# Patient Record
Sex: Female | Born: 1945 | Race: White | Hispanic: No | Smoking: Former smoker
Health system: Southern US, Community
[De-identification: ages and names within clinical notes are randomized; demographics above are authoritative.]

## PROBLEM LIST (undated history)

## (undated) DIAGNOSIS — M1990 Unspecified osteoarthritis, unspecified site: Secondary | ICD-10-CM

## (undated) DIAGNOSIS — Z9981 Dependence on supplemental oxygen: Secondary | ICD-10-CM

## (undated) DIAGNOSIS — F410 Panic disorder [episodic paroxysmal anxiety] without agoraphobia: Secondary | ICD-10-CM

## (undated) DIAGNOSIS — S0300XA Dislocation of jaw, unspecified side, initial encounter: Secondary | ICD-10-CM

## (undated) DIAGNOSIS — K219 Gastro-esophageal reflux disease without esophagitis: Secondary | ICD-10-CM

## (undated) DIAGNOSIS — C50912 Malignant neoplasm of unspecified site of left female breast: Secondary | ICD-10-CM

## (undated) DIAGNOSIS — I639 Cerebral infarction, unspecified: Secondary | ICD-10-CM

## (undated) DIAGNOSIS — Z9289 Personal history of other medical treatment: Secondary | ICD-10-CM

## (undated) DIAGNOSIS — Z973 Presence of spectacles and contact lenses: Secondary | ICD-10-CM

## (undated) DIAGNOSIS — J189 Pneumonia, unspecified organism: Secondary | ICD-10-CM

## (undated) DIAGNOSIS — IMO0002 Reserved for concepts with insufficient information to code with codable children: Secondary | ICD-10-CM

## (undated) DIAGNOSIS — Z8719 Personal history of other diseases of the digestive system: Secondary | ICD-10-CM

## (undated) DIAGNOSIS — G459 Transient cerebral ischemic attack, unspecified: Secondary | ICD-10-CM

## (undated) DIAGNOSIS — I10 Essential (primary) hypertension: Secondary | ICD-10-CM

## (undated) HISTORY — DX: Gastro-esophageal reflux disease without esophagitis: K21.9

## (undated) HISTORY — PX: JOINT REPLACEMENT: SHX530

## (undated) HISTORY — DX: Unspecified osteoarthritis, unspecified site: M19.90

## (undated) HISTORY — PX: COSMETIC SURGERY: SHX468

## (undated) HISTORY — DX: Essential (primary) hypertension: I10

## (undated) HISTORY — PX: VAGINAL HYSTERECTOMY: SUR661

## (undated) HISTORY — DX: Dislocation of jaw, unspecified side, initial encounter: S03.00XA

## (undated) HISTORY — PX: CARPAL TUNNEL RELEASE: SHX101

## (undated) HISTORY — PX: BACK SURGERY: SHX140

## (undated) HISTORY — DX: Reserved for concepts with insufficient information to code with codable children: IMO0002

## (undated) HISTORY — PX: TONSILLECTOMY: SUR1361

---
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
## 2011-09-04 ENCOUNTER — Telehealth: Payer: Self-pay

## 2011-09-04 DIAGNOSIS — R0600 Dyspnea, unspecified: Secondary | ICD-10-CM

## 2011-09-04 NOTE — Telephone Encounter (Signed)
There is no info in epic on her. Who is her PCP ? Why did she choose me ? Is a friend/relative/family a patient of mine ? If so, whom ?  Thanks  MR

## 2011-09-04 NOTE — Telephone Encounter (Signed)
Spoke with [name]. She states that Dr. Corliss Skains usually refers all of her pt's here to [facility]. She states that the pt is seen there for multiple joint pain and has only been seen there twice. She states that she is needing to be seen here for SOB p mold exposure. Her PCP is Dr. Rosalin Hawking. Please advise thanks

## 2011-09-04 NOTE — Telephone Encounter (Signed)
MR, since you are first choice do you want to [facility] your schedule to see the pt? Please advise thanks!

## 2011-09-08 NOTE — Telephone Encounter (Signed)
Called for Breanna [name], was placed on hold x 5 min. WCB later.

## 2011-09-08 NOTE — Telephone Encounter (Signed)
I have only 9 patients on 09/11/11. Please place her in for consult on 7/.5/13 Friday as add on in the middle or towards end part of the clinic. I would prefer she has PFTs done before she sseems. Indication dyspnea and PFTs can be done at [facility] [place] or [facility] ([facility])

## 2011-09-08 NOTE — Telephone Encounter (Signed)
Dr. Fatima Sanger office is calling back for the status of this appointment.  Advised [facility] would be in this afternoon.  [name] w/ Dr. Fatima Sanger office can be reached at [phone].  Breanna [name]

## 2011-09-09 ENCOUNTER — Telehealth: Payer: Self-pay | Admitting: Internal Medicine

## 2011-09-09 ENCOUNTER — Encounter: Payer: Self-pay | Admitting: Internal Medicine

## 2011-09-09 NOTE — Telephone Encounter (Signed)
I spoke with [name] and pt is scheduled to come in  09/11/11 at 3:00. [name] aware to have pt arrive 15 minutes early to fill out paperwork. She is going to fax notes over. I have also added a home phone number in chart for pt.

## 2011-09-09 NOTE — Telephone Encounter (Signed)
Ok thanks 

## 2011-09-09 NOTE — Telephone Encounter (Signed)
Dr Drusilla Kanner texted me this morning about status of appt. I will let her know their office placed you on hold x 5 min yesterday. I suggest you directly  call patient and give appt direct and get the referral paper later

## 2011-09-09 NOTE — Telephone Encounter (Signed)
Pt has no number listed in chart so I will call Dr. Percell Locus office back. Will sign off message since their is already one open on pt.

## 2011-09-11 ENCOUNTER — Institutional Professional Consult (permissible substitution): Payer: Self-pay | Admitting: Internal Medicine

## 2011-09-16 ENCOUNTER — Telehealth: Payer: Self-pay | Admitting: Internal Medicine

## 2011-09-16 NOTE — Telephone Encounter (Signed)
The pt was originally scheduled to see [facility] on Friday 09-11-11, but had to be r/s. She r/s to 10-07-11 with [facility]. MR wanted me to call and offer pt an appt on Friday 09-18-11 because he opened office that morning. I [id number]. Carron Curie, CMA

## 2011-09-17 NOTE — Telephone Encounter (Signed)
Pt scheduled to see [facility] on 09-18-11 at 10am.Breanna [name], CMA

## 2011-09-17 NOTE — Telephone Encounter (Signed)
Pt returned [name]'s call.  Pt can be reached at [phone],  her cell [phone] or her husband's cell [phone].  I did not see any available slots w/ [facility] on Friday, 09/18/11.  Breanna [name]

## 2011-09-18 ENCOUNTER — Ambulatory Visit (INDEPENDENT_AMBULATORY_CARE_PROVIDER_SITE_OTHER): Payer: Medicare Other | Admitting: Internal Medicine

## 2011-09-18 ENCOUNTER — Other Ambulatory Visit: Payer: Medicare Other

## 2011-09-18 ENCOUNTER — Encounter: Payer: Self-pay | Admitting: Internal Medicine

## 2011-09-18 ENCOUNTER — Ambulatory Visit (INDEPENDENT_AMBULATORY_CARE_PROVIDER_SITE_OTHER)
Admission: RE | Admit: 2011-09-18 | Discharge: 2011-09-18 | Disposition: A | Discharge status: Home / Self Care | Payer: Medicare Other | Source: Ambulatory Visit | Attending: Internal Medicine | Admitting: Internal Medicine

## 2011-09-18 VITALS — BP 140/82 | HR 69 | Temp 98.0°F | Ht 64.5 in | Wt 210.6 lb

## 2011-09-18 DIAGNOSIS — Z77120 Contact with and (suspected) exposure to mold (toxic): Secondary | ICD-10-CM

## 2011-09-18 DIAGNOSIS — R0600 Dyspnea, unspecified: Secondary | ICD-10-CM

## 2011-09-18 DIAGNOSIS — R0609 Other forms of dyspnea: Secondary | ICD-10-CM

## 2011-09-18 DIAGNOSIS — R0902 Hypoxemia: Secondary | ICD-10-CM

## 2011-09-18 DIAGNOSIS — R0989 Other specified symptoms and signs involving the circulatory and respiratory systems: Secondary | ICD-10-CM

## 2011-09-18 NOTE — Progress Notes (Signed)
Subjective:    Patient ID: Breanna [name], female    DOB: Mar 09, 1946, 66 y.o.   MRN: [id number]  HPI PCP is Provider Not In System . Body mass index is 35.59 kg/(m^2).  reports that she quit smoking about 23 years ago. Her smoking use included Cigarettes. She has a 15 pack-year smoking history. She has never used smokeless tobacco.   IOV 09/18/2011 Referred by Dr Pollyann Savoy. SHe is a 66 year old female.   At baseline does not have headache or migraine or cough, dyspnea, or chest tighness, wheezing. Approx 6 weeks ago, cleaned out a basement for 2 days that had lot of mold on it. NExt day dyspneic and went to PMD (cxr reportedly normal) who started patient on antibiotic doxy, prednisone x 13 days, symbicort, and veramyst. This was 3 weeks ago. She finished antibiotic and pred burst in 10 and 14 days respectively but around this time started getting "migraines" (headache noted as sinus pressure, back of neck ache, sides of headache, nauseated, relieved by ice and being in a dark room) so she stopped symbicort and veramyst 5 days ago but stil with migraines.  Currently still with respiratory complaints - with dyspnea being the chief issue amongst respiratory complaints. Says driving up to office, got dyspneic abruptly. Feels like a panic attack though does not have a sense of doom and she is going to die. There is associated cough and wheeze but is mild and significantly improved. Dyspnea made worse by humidity. Walking does not make her dyspneic but going up steps makes her dyspneic. Severity is moderate-severe. No associaed chest pain, orthopnea, paroxysmal nocturnal dyspnea or edema.   Note; She got emotional talking about her dyspnea. Said she was under lot of stress due to her health but would not elaborate  Walk test in office   -185 feet x  3 laps: pulse ox 93% at rest on arrival, 86% after spirometry at rest, then with walking fluctuated between 86% and 93%  Spirometry today in office  -  fev1 1.9L/815, FVC 2.$L/78%, ratio 81, - mild restriction could be c/w obesity   Outside labs per DR Corliss Skains note  - ESR 4  RF - neg  - ANA +ve1:40 CCP - negative  CMP - normal Vit D 76 Hep panel neg  ACE negative    Past Medical History  Diagnosis Date  . Osteoarthritis   . Psoriasis   . DDD (degenerative disc disease)   . TMJ (dislocation of temporomandibular joint)   . GERD (gastroesophageal reflux disease)   . Hypertension      Family History  Problem Relation Age of Onset  . Rheum arthritis    . Cancer    . Osteoarthritis    . Heart disease Father   . Kidney disease    . Alcohol abuse    . Hypertension    . Goiter    . Emphysema Mother   . Allergies Sister   . Asthma Sister   . Hodgkin's lymphoma Brother   . Lymphoma Mother      History   Social History  . Marital Status: Married    Spouse Name: N/A    Number of Children: 3  . Years of Education: N/A   Occupational History  . Retired     Engineer, civil (consulting)   Social History Main Topics  . Smoking status: Former Smoker -- 1.0 packs/day for 15 years    Types: Cigarettes    Quit date: 03/09/1988  . Smokeless tobacco:  Never Used  . Alcohol Use: No  . Drug Use: No  . Sexually Active: Not on file   Other Topics Concern  . Not on file   Social History Narrative  . No narrative on file     Allergies  Allergen Reactions  . Nasonex (Mometasone Furoate)     headache  . Neurontin (Gabapentin)     depression     No outpatient prescriptions prior to visit.         Review of Systems  Constitutional: Negative for fever and unexpected weight change.  HENT: Positive for congestion, sore throat, rhinorrhea, sneezing, postnasal drip and sinus pressure. Negative for ear pain, nosebleeds, trouble swallowing and dental problem.   Eyes: Positive for redness and itching.  Respiratory: Positive for cough, chest tightness, shortness of breath and wheezing.   Cardiovascular: Positive for leg swelling. Negative  for palpitations.  Gastrointestinal: Positive for nausea. Negative for vomiting.  Genitourinary: Negative for dysuria.  Musculoskeletal: Positive for joint swelling.  Skin: Negative for rash.  Neurological: Positive for headaches.  Hematological: Does not bruise/bleed easily.  Psychiatric/Behavioral: Negative for dysphoric mood. The patient is nervous/anxious.        Objective:   Physical Exam  Vitals reviewed. Constitutional: She is oriented to person, place, and time. She appears well-developed and well-nourished. No distress.       Obese Body mass index is 35.59 kg/(m^2).   HENT:  Head: Normocephalic and atraumatic.  Right Ear: External ear normal.  Left Ear: External ear normal.  Mouth/Throat: Oropharynx is clear and moist. No oropharyngeal exudate.       mallampatti class 2-3  Eyes: Conjunctivae and EOM are normal. Pupils are equal, round, and reactive to light. Right eye exhibits no discharge. Left eye exhibits no discharge. No scleral icterus.  Neck: Normal range of motion. Neck supple. No JVD present. No tracheal deviation present. No thyromegaly present.  Cardiovascular: Normal rate, regular rhythm, normal heart sounds and intact distal pulses.  Exam reveals no gallop and no friction rub.   No murmur heard. Pulmonary/Chest: Effort normal and breath sounds normal. No respiratory distress. She has no wheezes. She has no rales. She exhibits no tenderness.  Abdominal: Soft. Bowel sounds are normal. She exhibits no distension and no mass. There is no tenderness. There is no rebound and no guarding.  Musculoskeletal: Normal range of motion. She exhibits no edema and no tenderness.  Lymphadenopathy:    She has no cervical adenopathy.  Neurological: She is alert and oriented to person, place, and time. She has normal reflexes. No cranial nerve deficit. She exhibits normal muscle tone. Coordination normal.  Skin: Skin is warm and dry. No rash noted. She is not diaphoretic. No  erythema. No pallor.  Psychiatric: Judgment and thought content normal.       Anxious One crying spell          Assessment & Plan:

## 2011-09-18 NOTE — Patient Instructions (Addendum)
Pleaes have CT chest Please have blood work towards mold exposure Pleaes have overnight oxygen study Please have full PFT breathing test I will inform you of results over next several days to weeks and instruct on followup based on that

## 2011-09-18 NOTE — Assessment & Plan Note (Signed)
She has dyspnea following mold exposure. This is new since mold exposure. There might be asspocitaed hypoxemia. I am concerned she might have hypersensitivity pneumonitis  Plan Pleaes have CT chest Please have blood work towards mold exposure Pleaes have overnight oxygen study Please have full PFT breathing test I will inform you of results over next several days to weeks and instruct on followup based on that (note, I will be on vacation adn will be abel to review blood work and ct chest only during this time)

## 2011-09-19 ENCOUNTER — Telehealth: Payer: Self-pay | Admitting: Internal Medicine

## 2011-09-19 NOTE — Telephone Encounter (Signed)
Ct chest 7.12/13   - clear no evidence of mold affecting lung tissue; this means shortness of breath unexplained. Once she has her PFTs see if you can scan it in and let me know. I can look remote and get back. Otherwise, wait till 10/12/11   - small < 4mm lung nodules - please order CT chest wo contrast repeat in 1 year  Thank MR

## 2011-09-24 NOTE — Telephone Encounter (Signed)
LMTCBx1.[name] Castillo, CMA  

## 2011-09-28 LAB — HYPERSENSITIVITY PNUEMONITIS PROFILE

## 2011-09-28 NOTE — Telephone Encounter (Signed)
Pt aware and PFt set for 09-30-11 at 1pm.[name] McCall, CMA

## 2011-09-30 ENCOUNTER — Ambulatory Visit (INDEPENDENT_AMBULATORY_CARE_PROVIDER_SITE_OTHER): Payer: Medicare Other | Admitting: Internal Medicine

## 2011-09-30 DIAGNOSIS — R0989 Other specified symptoms and signs involving the circulatory and respiratory systems: Secondary | ICD-10-CM

## 2011-09-30 DIAGNOSIS — R0600 Dyspnea, unspecified: Secondary | ICD-10-CM

## 2011-09-30 LAB — PULMONARY FUNCTION TEST

## 2011-09-30 NOTE — Progress Notes (Signed)
PFT done today. 

## 2011-10-07 ENCOUNTER — Institutional Professional Consult (permissible substitution): Payer: Self-pay | Admitting: Internal Medicine

## 2011-10-13 ENCOUNTER — Telehealth: Payer: Self-pay | Admitting: Internal Medicine

## 2011-10-13 NOTE — Telephone Encounter (Signed)
ONO 09/29/11 - desaturatin at nuight + (34 min , 8.5% of total sleep < 88% but no pulse ox < 80%). Please start her on 1L oxygen. PFt normal   Please have her evaluated for possible sleep apnea and then regroup to see me. See any of the sleep docs in our office  For my review below  PFT - 09/30/11 - normal except isolated low dlcio  HP panel - mold antigent positive but ct shows no evidence of ILD

## 2011-10-15 ENCOUNTER — Encounter: Payer: Self-pay | Admitting: Internal Medicine

## 2011-10-15 NOTE — Telephone Encounter (Signed)
LMTCBx1.Rufus Cypert, CMA  

## 2011-10-16 NOTE — Telephone Encounter (Signed)
Pt returned call. [phone]. Breanna [name]

## 2011-10-16 NOTE — Telephone Encounter (Signed)
LMTCBx2. Elane Peabody, CMA  

## 2011-10-16 NOTE — Telephone Encounter (Signed)
Patient returning call.

## 2011-10-16 NOTE — Telephone Encounter (Signed)
I spoke with the pt and advised of results. The pt states she is going out of town in 2 days for 1 month and does not want to worry about oxygen on the trip. She states she will call when she returns and will schedule appt with sleep doctor and then at that time we can order oxygen. I have placed a reminder to send order for oxygen 1 liter qhs in one month. Carron Curie, CMA

## 2011-11-26 ENCOUNTER — Other Ambulatory Visit: Payer: Self-pay | Admitting: Internal Medicine

## 2011-11-26 DIAGNOSIS — R0600 Dyspnea, unspecified: Secondary | ICD-10-CM

## 2011-11-27 ENCOUNTER — Telehealth: Payer: Self-pay | Admitting: Internal Medicine

## 2011-11-27 NOTE — Telephone Encounter (Signed)
Spoke with patient- states she doesn't feel she needs the Oxygen therefore she refused the order from [facility]; will forward to MR for any additional recs/advice.

## 2011-11-27 NOTE — Telephone Encounter (Signed)
Not ok but I respect her decision. Will d.w her at followup. Cancel o2 set up

## 2011-11-27 NOTE — Telephone Encounter (Signed)
I spoke with Dawn at [facility] and have cancelled the order for QHS O2; also I left a message at patients home number that we have DC'd the order and MR will discuss in detail with her at next OV.

## 2013-03-09 DIAGNOSIS — G459 Transient cerebral ischemic attack, unspecified: Secondary | ICD-10-CM

## 2013-03-09 HISTORY — DX: Transient cerebral ischemic attack, unspecified: G45.9

## 2014-02-22 DIAGNOSIS — M1711 Unilateral primary osteoarthritis, right knee: Secondary | ICD-10-CM | POA: Insufficient documentation

## 2014-03-09 HISTORY — PX: KNEE ARTHROSCOPY: SHX127

## 2014-07-20 ENCOUNTER — Other Ambulatory Visit: Payer: Self-pay | Admitting: Orthopaedic Surgery

## 2014-08-02 ENCOUNTER — Encounter (HOSPITAL_COMMUNITY)
Admission: RE | Admit: 2014-08-02 | Discharge: 2014-08-02 | Disposition: A | Discharge status: Home / Self Care | Payer: Medicare Other | Source: Ambulatory Visit | Attending: Orthopaedic Surgery | Admitting: Orthopaedic Surgery

## 2014-08-02 ENCOUNTER — Encounter (HOSPITAL_COMMUNITY): Payer: Self-pay

## 2014-08-02 HISTORY — DX: Cerebral infarction, unspecified: I63.9

## 2014-08-02 HISTORY — DX: Panic disorder (episodic paroxysmal anxiety): F41.0

## 2014-08-02 LAB — CBC WITH DIFFERENTIAL/PLATELET
Basophils Absolute: 0 10*3/uL (ref 0.0–0.1)
Basophils Relative: 1 % (ref 0–1)
Eosinophils Absolute: 0.1 10*3/uL (ref 0.0–0.7)
Eosinophils Relative: 2 % (ref 0–5)
HCT: 42.9 % (ref 36.0–46.0)
HEMOGLOBIN: 14.5 g/dL (ref 12.0–15.0)
Lymphocytes Relative: 38 % (ref 12–46)
Lymphs Abs: 2.5 10*3/uL (ref 0.7–4.0)
MCH: 31.5 pg (ref 26.0–34.0)
MCHC: 33.8 g/dL (ref 30.0–36.0)
MCV: 93.3 fL (ref 78.0–100.0)
MONO ABS: 0.6 10*3/uL (ref 0.1–1.0)
Monocytes Relative: 9 % (ref 3–12)
NEUTROS ABS: 3.5 10*3/uL (ref 1.7–7.7)
Neutrophils Relative %: 52 % (ref 43–77)
Platelets: 238 10*3/uL (ref 150–400)
RBC: 4.6 MIL/uL (ref 3.87–5.11)
RDW: 12.7 % (ref 11.5–15.5)
WBC: 6.7 10*3/uL (ref 4.0–10.5)

## 2014-08-02 LAB — BASIC METABOLIC PANEL
Anion gap: 10 (ref 5–15)
BUN: 15 mg/dL (ref 6–20)
CHLORIDE: 103 mmol/L (ref 101–111)
CO2: 28 mmol/L (ref 22–32)
Calcium: 9.4 mg/dL (ref 8.9–10.3)
Creatinine, Ser: 0.62 mg/dL (ref 0.44–1.00)
GFR calc non Af Amer: >60 mL/min (ref >60)
GLUCOSE: 91 mg/dL (ref 65–99)
Potassium: 3.7 mmol/L (ref 3.5–5.1)
SODIUM: 141 mmol/L (ref 135–145)

## 2014-08-02 LAB — PROTIME-INR
INR: 1.04 (ref 0.00–1.49)
Prothrombin Time: 13.8 seconds (ref 11.6–15.2)

## 2014-08-02 LAB — URINALYSIS, ROUTINE W REFLEX MICROSCOPIC
Bilirubin Urine: NEGATIVE
Glucose, UA: NEGATIVE mg/dL
Hgb urine dipstick: NEGATIVE
Ketones, ur: NEGATIVE mg/dL
Leukocytes, UA: NEGATIVE
NITRITE: NEGATIVE
PH: 6.5 (ref 5.0–8.0)
Protein, ur: NEGATIVE mg/dL
Specific Gravity, Urine: 1.007 (ref 1.005–1.030)
Urobilinogen, UA: 0.2 mg/dL (ref 0.0–1.0)

## 2014-08-02 LAB — TYPE AND SCREEN
ABO/RH(D): O NEG
ANTIBODY SCREEN: NEGATIVE

## 2014-08-02 LAB — APTT: APTT: 29 s (ref 24–37)

## 2014-08-02 LAB — SURGICAL PCR SCREEN
MRSA, PCR: NEGATIVE
Staphylococcus aureus: POSITIVE — AB

## 2014-08-02 LAB — ABO/RH: ABO/RH(D): O NEG

## 2014-08-02 NOTE — Progress Notes (Signed)
Have requested ov,ekg,cxr from Dr. Ralph Leyden in [place].Have also requested  ekg from [facility],pt not sure where this was done.  Stated she has Stress Test with a Dr. Corena Pilgrim in [place] about a year ago will try to also obtain this.  Spoke with Lattie Haw in Dr. Jerald Kief office about plavix and she confirmed that pt. Was not to stop plavix.Note  From dr. Rhona Raider  In the chart.

## 2014-08-02 NOTE — Progress Notes (Signed)
Mupirocin Ointment Rx called into [facility] in [place], [place] for positive PCR of staph. Left message on pt's voicemail notifying her of results.

## 2014-08-02 NOTE — Pre-Procedure Instructions (Addendum)
    Breanna [name]  08/02/2014      [facility] [address], [address] [place] [address] Phone: [phone] Fax: [phone]    Your procedure is scheduled on 08-14-2014   Tuesday   Report to [facility] Admitting at Call [phone] at 8:00 AM the morning of surgery for arrival time .  Call this number if you have problems the morning of surgery:   [phone]   Remember:   Do not eat food or drink liquids after midnight.   Take these medicines the morning of surgery with A SIP OF WATER amlodipine,Carvedilol(coreg),estradiol(Estrace),fexofenadine(allergra),prevacid,paroxetine(paxil),potassium,pain medication if needed   Do not wear jewelry, make-up or nail polish.  Do not wear lotions, powders, or perfumes.    Do not shave 48 hours prior to surgery.  Men may shave face and neck.   Do not bring valuables to the hospital.  [facility] is not responsible for any belongings or valuables.  Contacts, dentures or bridgework may not be worn into surgery.  Leave your suitcase in the car.  After surgery it may be brought to your room.  For patients admitted to the hospital, discharge time will be determined by your treatment team.  Patients discharged the day of surgery will not be allowed to drive home.   Special instructions:  See attached sheet for instructions on CHG shower  Please read over the following fact sheets that you were given. Pain Booklet, Coughing and Deep Breathing, Blood Transfusion Information and Surgical Site Infection Prevention

## 2014-08-03 NOTE — Progress Notes (Signed)
Spoke with [facility] and re-requested needed information.  Pt does not have a CXR with them but they stated he had an EKG in March and OV note from April and would be faxing those now.  Re submitted the fax request to them as well.

## 2014-08-13 MED ORDER — CEFAZOLIN SODIUM-DEXTROSE 2-3 GM-% IV SOLR
2.0000 g | INTRAVENOUS | Status: AC
  Administered 2014-08-14: 2 g via INTRAVENOUS
  Filled 2014-08-13: qty 50

## 2014-08-13 MED ORDER — LACTATED RINGERS IV SOLN
INTRAVENOUS | Status: DC
  Administered 2014-08-14: 12:00:00 via INTRAVENOUS

## 2014-08-13 NOTE — Progress Notes (Signed)
Patient called to arrive at 1100 am
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
## 2014-08-14 ENCOUNTER — Inpatient Hospital Stay (HOSPITAL_COMMUNITY): Payer: Medicare Other | Admitting: Anesthesiology

## 2014-08-14 ENCOUNTER — Encounter (HOSPITAL_COMMUNITY): Payer: Self-pay

## 2014-08-14 ENCOUNTER — Encounter (HOSPITAL_COMMUNITY)
Admission: RE | Disposition: A | Discharge status: Home Health | Payer: Self-pay | Source: Ambulatory Visit | Attending: Orthopaedic Surgery

## 2014-08-14 ENCOUNTER — Inpatient Hospital Stay (HOSPITAL_COMMUNITY)
Admission: RE | Admit: 2014-08-14 | Discharge: 2014-08-16 | DRG: 470 | Disposition: A | Discharge status: Home Health | Payer: Medicare Other | Source: Ambulatory Visit | Attending: Orthopaedic Surgery | Admitting: Orthopaedic Surgery

## 2014-08-14 DIAGNOSIS — K219 Gastro-esophageal reflux disease without esophagitis: Secondary | ICD-10-CM | POA: Diagnosis present

## 2014-08-14 DIAGNOSIS — M1712 Unilateral primary osteoarthritis, left knee: Principal | ICD-10-CM | POA: Diagnosis present

## 2014-08-14 DIAGNOSIS — Z8673 Personal history of transient ischemic attack (TIA), and cerebral infarction without residual deficits: Secondary | ICD-10-CM

## 2014-08-14 DIAGNOSIS — Z6835 Body mass index (BMI) 35.0-35.9, adult: Secondary | ICD-10-CM

## 2014-08-14 DIAGNOSIS — E669 Obesity, unspecified: Secondary | ICD-10-CM | POA: Diagnosis present

## 2014-08-14 DIAGNOSIS — Z87891 Personal history of nicotine dependence: Secondary | ICD-10-CM | POA: Diagnosis not present

## 2014-08-14 DIAGNOSIS — Z01812 Encounter for preprocedural laboratory examination: Secondary | ICD-10-CM

## 2014-08-14 DIAGNOSIS — Z885 Allergy status to narcotic agent status: Secondary | ICD-10-CM

## 2014-08-14 DIAGNOSIS — M1710 Unilateral primary osteoarthritis, unspecified knee: Secondary | ICD-10-CM | POA: Diagnosis present

## 2014-08-14 DIAGNOSIS — I10 Essential (primary) hypertension: Secondary | ICD-10-CM | POA: Diagnosis present

## 2014-08-14 DIAGNOSIS — Z888 Allergy status to other drugs, medicaments and biological substances status: Secondary | ICD-10-CM

## 2014-08-14 HISTORY — PX: INJECTION KNEE: SHX2446

## 2014-08-14 HISTORY — PX: TOTAL KNEE ARTHROPLASTY: SHX125

## 2014-08-14 SURGERY — ARTHROPLASTY, KNEE, TOTAL
Anesthesia: Regional | Site: Knee | Laterality: Right

## 2014-08-14 MED ORDER — PROPOFOL 10 MG/ML IV BOLUS
INTRAVENOUS | Status: DC | PRN
  Administered 2014-08-14: 50 mg via INTRAVENOUS
  Administered 2014-08-14: 150 mg via INTRAVENOUS

## 2014-08-14 MED ORDER — HYDROMORPHONE HCL 1 MG/ML IJ SOLN
0.5000 mg | INTRAMUSCULAR | Status: DC | PRN
  Administered 2014-08-14: 1 mg via INTRAVENOUS
  Filled 2014-08-14: qty 1

## 2014-08-14 MED ORDER — CEFAZOLIN SODIUM-DEXTROSE 2-3 GM-% IV SOLR
2.0000 g | Freq: Four times a day (QID) | INTRAVENOUS | Status: AC
Start: 2014-08-14 — End: 2014-08-15
  Administered 2014-08-14 – 2014-08-15 (×2): 2 g via INTRAVENOUS
  Filled 2014-08-14 (×2): qty 50

## 2014-08-14 MED ORDER — MIDAZOLAM HCL 5 MG/5ML IJ SOLN
INTRAMUSCULAR | Status: DC | PRN
  Administered 2014-08-14: 1 mg via INTRAVENOUS

## 2014-08-14 MED ORDER — DOCUSATE SODIUM 100 MG PO CAPS
100.0000 mg | ORAL_CAPSULE | Freq: Two times a day (BID) | ORAL | Status: DC
Start: 2014-08-14 — End: 2014-08-16
  Administered 2014-08-14 – 2014-08-16 (×4): 100 mg via ORAL
  Filled 2014-08-14 (×4): qty 1

## 2014-08-14 MED ORDER — BISACODYL 5 MG PO TBEC: 5.0000 mg | DELAYED_RELEASE_TABLET | Freq: Every day | ORAL | Status: DC | PRN

## 2014-08-14 MED ORDER — FENTANYL CITRATE (PF) 250 MCG/5ML IJ SOLN
INTRAMUSCULAR | Status: AC
  Filled 2014-08-14: qty 5

## 2014-08-14 MED ORDER — HYDROCODONE-ACETAMINOPHEN 5-325 MG PO TABS: 1.0000 | ORAL_TABLET | ORAL | Status: DC | PRN

## 2014-08-14 MED ORDER — BUPIVACAINE HCL 0.25 % IJ SOLN
INTRAMUSCULAR | Status: DC | PRN
  Administered 2014-08-14: 20 mL

## 2014-08-14 MED ORDER — LACTATED RINGERS IV SOLN: INTRAVENOUS | Status: DC

## 2014-08-14 MED ORDER — ONDANSETRON HCL 4 MG/2ML IJ SOLN: 4.0000 mg | Freq: Four times a day (QID) | INTRAMUSCULAR | Status: DC | PRN

## 2014-08-14 MED ORDER — BUPIVACAINE LIPOSOME 1.3 % IJ SUSP: INTRAMUSCULAR | Status: DC | PRN

## 2014-08-14 MED ORDER — TRANEXAMIC ACID 1000 MG/10ML IV SOLN
2000.0000 mg | INTRAVENOUS | Status: AC
  Administered 2014-08-14: 2000 mg via TOPICAL
  Filled 2014-08-14: qty 20

## 2014-08-14 MED ORDER — GLYCOPYRROLATE 0.2 MG/ML IJ SOLN
INTRAMUSCULAR | Status: AC
  Filled 2014-08-14: qty 2

## 2014-08-14 MED ORDER — ACETAMINOPHEN 650 MG RE SUPP: 650.0000 mg | Freq: Four times a day (QID) | RECTAL | Status: DC | PRN

## 2014-08-14 MED ORDER — NEOSTIGMINE METHYLSULFATE 10 MG/10ML IV SOLN
INTRAVENOUS | Status: AC
  Filled 2014-08-14: qty 1

## 2014-08-14 MED ORDER — LORATADINE 10 MG PO TABS
10.0000 mg | ORAL_TABLET | Freq: Every day | ORAL | Status: DC
  Administered 2014-08-15 – 2014-08-16 (×2): 10 mg via ORAL
  Filled 2014-08-14 (×2): qty 1

## 2014-08-14 MED ORDER — FENTANYL CITRATE (PF) 100 MCG/2ML IJ SOLN
INTRAMUSCULAR | Status: AC
  Filled 2014-08-14: qty 2

## 2014-08-14 MED ORDER — PHENYLEPHRINE HCL 10 MG/ML IJ SOLN
INTRAMUSCULAR | Status: DC | PRN
  Administered 2014-08-14: 40 ug via INTRAVENOUS

## 2014-08-14 MED ORDER — HYDROMORPHONE HCL 1 MG/ML IJ SOLN
INTRAMUSCULAR | Status: AC
  Administered 2014-08-14: 0.5 mg via INTRAVENOUS
  Filled 2014-08-14: qty 1

## 2014-08-14 MED ORDER — LACTATED RINGERS IV SOLN
INTRAVENOUS | Status: DC
  Administered 2014-08-14: 21:00:00 via INTRAVENOUS

## 2014-08-14 MED ORDER — KETOROLAC TROMETHAMINE 30 MG/ML IJ SOLN
INTRAMUSCULAR | Status: AC
Start: 2014-08-14 — End: 2014-08-15
  Filled 2014-08-14: qty 1

## 2014-08-14 MED ORDER — HYDROMORPHONE HCL 1 MG/ML IJ SOLN
INTRAMUSCULAR | Status: AC
  Filled 2014-08-14: qty 1

## 2014-08-14 MED ORDER — KETOROLAC TROMETHAMINE 30 MG/ML IJ SOLN
30.0000 mg | Freq: Once | INTRAMUSCULAR | Status: AC | PRN
  Administered 2014-08-14: 30 mg via INTRAVENOUS

## 2014-08-14 MED ORDER — ASPIRIN EC 325 MG PO TBEC
325.0000 mg | DELAYED_RELEASE_TABLET | Freq: Two times a day (BID) | ORAL | Status: DC
  Administered 2014-08-15 – 2014-08-16 (×4): 325 mg via ORAL
  Filled 2014-08-14 (×4): qty 1

## 2014-08-14 MED ORDER — CHLORTHALIDONE 25 MG PO TABS
25.0000 mg | ORAL_TABLET | Freq: Every day | ORAL | Status: DC
  Administered 2014-08-14 – 2014-08-16 (×3): 25 mg via ORAL
  Filled 2014-08-14 (×3): qty 1

## 2014-08-14 MED ORDER — METOCLOPRAMIDE HCL 5 MG PO TABS: 5.0000 mg | ORAL_TABLET | Freq: Three times a day (TID) | ORAL | Status: DC | PRN

## 2014-08-14 MED ORDER — NEOSTIGMINE METHYLSULFATE 10 MG/10ML IV SOLN
INTRAVENOUS | Status: DC | PRN
  Administered 2014-08-14: 3 mg via INTRAVENOUS

## 2014-08-14 MED ORDER — PHENYLEPHRINE 40 MCG/ML (10ML) SYRINGE FOR IV PUSH (FOR BLOOD PRESSURE SUPPORT)
PREFILLED_SYRINGE | INTRAVENOUS | Status: AC
  Filled 2014-08-14: qty 10

## 2014-08-14 MED ORDER — PANTOPRAZOLE SODIUM 20 MG PO TBEC
20.0000 mg | DELAYED_RELEASE_TABLET | Freq: Every day | ORAL | Status: DC
  Administered 2014-08-15 – 2014-08-16 (×2): 20 mg via ORAL
  Filled 2014-08-14 (×2): qty 1

## 2014-08-14 MED ORDER — POTASSIUM CHLORIDE CRYS ER 20 MEQ PO TBCR
10.0000 meq | EXTENDED_RELEASE_TABLET | Freq: Two times a day (BID) | ORAL | Status: DC
  Administered 2014-08-14 – 2014-08-15 (×3): 10 meq via ORAL
  Administered 2014-08-16: 20 meq via ORAL
  Filled 2014-08-14 (×4): qty 1

## 2014-08-14 MED ORDER — ESTRADIOL 1 MG PO TABS
1.0000 mg | ORAL_TABLET | Freq: Every day | ORAL | Status: DC
  Administered 2014-08-15 – 2014-08-16 (×2): 1 mg via ORAL
  Filled 2014-08-14 (×2): qty 1

## 2014-08-14 MED ORDER — LIDOCAINE HCL (CARDIAC) 20 MG/ML IV SOLN
INTRAVENOUS | Status: AC
  Filled 2014-08-14: qty 5

## 2014-08-14 MED ORDER — PAROXETINE HCL 20 MG PO TABS
10.0000 mg | ORAL_TABLET | Freq: Every day | ORAL | Status: DC
  Administered 2014-08-14 – 2014-08-15 (×2): 10 mg via ORAL
  Filled 2014-08-14 (×3): qty 1

## 2014-08-14 MED ORDER — ACETAMINOPHEN 10 MG/ML IV SOLN
INTRAVENOUS | Status: DC | PRN
  Administered 2014-08-14: 1000 mg via INTRAVENOUS

## 2014-08-14 MED ORDER — BUPIVACAINE LIPOSOME 1.3 % IJ SUSP
20.0000 mL | INTRAMUSCULAR | Status: AC
  Administered 2014-08-14: 20 mL
  Filled 2014-08-14: qty 20

## 2014-08-14 MED ORDER — MENTHOL 3 MG MT LOZG: 1.0000 | LOZENGE | OROMUCOSAL | Status: DC | PRN

## 2014-08-14 MED ORDER — METHOCARBAMOL 500 MG PO TABS
500.0000 mg | ORAL_TABLET | Freq: Four times a day (QID) | ORAL | Status: DC | PRN
  Administered 2014-08-14 – 2014-08-16 (×4): 500 mg via ORAL
  Filled 2014-08-14 (×3): qty 1

## 2014-08-14 MED ORDER — MIDAZOLAM HCL 2 MG/2ML IJ SOLN
INTRAMUSCULAR | Status: AC
  Filled 2014-08-14: qty 2

## 2014-08-14 MED ORDER — LABETALOL HCL 5 MG/ML IV SOLN
INTRAVENOUS | Status: DC | PRN
  Administered 2014-08-14 (×2): 5 mg via INTRAVENOUS

## 2014-08-14 MED ORDER — LACTATED RINGERS IV SOLN
INTRAVENOUS | Status: DC | PRN
  Administered 2014-08-14 (×2): via INTRAVENOUS

## 2014-08-14 MED ORDER — ALUM & MAG HYDROXIDE-SIMETH 200-200-20 MG/5ML PO SUSP: 30.0000 mL | ORAL | Status: DC | PRN

## 2014-08-14 MED ORDER — ONDANSETRON HCL 4 MG PO TABS: 4.0000 mg | ORAL_TABLET | Freq: Four times a day (QID) | ORAL | Status: DC | PRN

## 2014-08-14 MED ORDER — PROPOFOL 10 MG/ML IV BOLUS
INTRAVENOUS | Status: AC
  Filled 2014-08-14: qty 20

## 2014-08-14 MED ORDER — CLOPIDOGREL BISULFATE 75 MG PO TABS
75.0000 mg | ORAL_TABLET | Freq: Every day | ORAL | Status: DC
  Administered 2014-08-14 – 2014-08-16 (×3): 75 mg via ORAL
  Filled 2014-08-14 (×3): qty 1

## 2014-08-14 MED ORDER — METHOCARBAMOL 500 MG PO TABS
ORAL_TABLET | ORAL | Status: AC
  Filled 2014-08-14: qty 1

## 2014-08-14 MED ORDER — LABETALOL HCL 5 MG/ML IV SOLN
INTRAVENOUS | Status: AC
  Filled 2014-08-14: qty 4

## 2014-08-14 MED ORDER — METOCLOPRAMIDE HCL 5 MG/ML IJ SOLN: 5.0000 mg | Freq: Three times a day (TID) | INTRAMUSCULAR | Status: DC | PRN

## 2014-08-14 MED ORDER — LISINOPRIL 40 MG PO TABS
40.0000 mg | ORAL_TABLET | Freq: Every day | ORAL | Status: DC
  Administered 2014-08-14 – 2014-08-16 (×3): 40 mg via ORAL
  Filled 2014-08-14 (×3): qty 1

## 2014-08-14 MED ORDER — MEPERIDINE HCL 25 MG/ML IJ SOLN: 6.2500 mg | INTRAMUSCULAR | Status: DC | PRN

## 2014-08-14 MED ORDER — METHYLPREDNISOLONE ACETATE 80 MG/ML IJ SUSP
INTRAMUSCULAR | Status: DC | PRN
  Administered 2014-08-14: 80 mg via INTRA_ARTICULAR

## 2014-08-14 MED ORDER — ACETAMINOPHEN 325 MG PO TABS
650.0000 mg | ORAL_TABLET | Freq: Four times a day (QID) | ORAL | Status: DC | PRN
  Administered 2014-08-14: 650 mg via ORAL

## 2014-08-14 MED ORDER — BUPIVACAINE HCL (PF) 0.25 % IJ SOLN
INTRAMUSCULAR | Status: AC
  Filled 2014-08-14: qty 30

## 2014-08-14 MED ORDER — GLYCOPYRROLATE 0.2 MG/ML IJ SOLN
INTRAMUSCULAR | Status: DC | PRN
  Administered 2014-08-14: 0.4 mg via INTRAVENOUS

## 2014-08-14 MED ORDER — AMLODIPINE BESYLATE 5 MG PO TABS
5.0000 mg | ORAL_TABLET | Freq: Every day | ORAL | Status: DC
  Administered 2014-08-15 – 2014-08-16 (×2): 5 mg via ORAL
  Filled 2014-08-14 (×2): qty 1

## 2014-08-14 MED ORDER — PROMETHAZINE HCL 25 MG/ML IJ SOLN: 6.2500 mg | INTRAMUSCULAR | Status: DC | PRN

## 2014-08-14 MED ORDER — CARVEDILOL 3.125 MG PO TABS
3.1250 mg | ORAL_TABLET | Freq: Two times a day (BID) | ORAL | Status: DC
  Administered 2014-08-15 – 2014-08-16 (×3): 3.125 mg via ORAL
  Filled 2014-08-14 (×6): qty 1

## 2014-08-14 MED ORDER — LIDOCAINE HCL (CARDIAC) 20 MG/ML IV SOLN
INTRAVENOUS | Status: DC | PRN
  Administered 2014-08-14: 50 mg via INTRAVENOUS

## 2014-08-14 MED ORDER — FENTANYL CITRATE (PF) 100 MCG/2ML IJ SOLN
INTRAMUSCULAR | Status: DC | PRN
  Administered 2014-08-14: 100 ug via INTRAVENOUS
  Administered 2014-08-14: 50 ug via INTRAVENOUS
  Administered 2014-08-14 (×2): 100 ug via INTRAVENOUS

## 2014-08-14 MED ORDER — METHOCARBAMOL 1000 MG/10ML IJ SOLN: 500.0000 mg | Freq: Four times a day (QID) | INTRAVENOUS | Status: DC | PRN

## 2014-08-14 MED ORDER — ONDANSETRON HCL 4 MG/2ML IJ SOLN
INTRAMUSCULAR | Status: DC | PRN
  Administered 2014-08-14: 4 mg via INTRAVENOUS

## 2014-08-14 MED ORDER — SODIUM CHLORIDE 0.9 % IR SOLN
Status: DC | PRN
  Administered 2014-08-14: 3000 mL

## 2014-08-14 MED ORDER — METHYLPREDNISOLONE ACETATE 80 MG/ML IJ SUSP
INTRAMUSCULAR | Status: AC
  Filled 2014-08-14: qty 1

## 2014-08-14 MED ORDER — ONDANSETRON HCL 4 MG/2ML IJ SOLN
INTRAMUSCULAR | Status: AC
  Filled 2014-08-14: qty 2

## 2014-08-14 MED ORDER — DIPHENHYDRAMINE HCL 12.5 MG/5ML PO ELIX
12.5000 mg | ORAL_SOLUTION | ORAL | Status: DC | PRN
  Administered 2014-08-15: 25 mg via ORAL
  Filled 2014-08-14 (×2): qty 10

## 2014-08-14 MED ORDER — PHENOL 1.4 % MT LIQD: 1.0000 | OROMUCOSAL | Status: DC | PRN

## 2014-08-14 MED ORDER — ROCURONIUM BROMIDE 100 MG/10ML IV SOLN
INTRAVENOUS | Status: DC | PRN
  Administered 2014-08-14: 50 mg via INTRAVENOUS

## 2014-08-14 MED ORDER — ACETAMINOPHEN 10 MG/ML IV SOLN
INTRAVENOUS | Status: AC
  Filled 2014-08-14: qty 100

## 2014-08-14 MED ORDER — HYDROMORPHONE HCL 1 MG/ML IJ SOLN
0.2500 mg | INTRAMUSCULAR | Status: DC | PRN
  Administered 2014-08-14 (×3): 0.5 mg via INTRAVENOUS

## 2014-08-14 SURGICAL SUPPLY — 69 items
BAG DECANTER FOR FLEXI CONT (MISCELLANEOUS) ×4 IMPLANT
BANDAGE ELASTIC 4 VELCRO ST LF (GAUZE/BANDAGES/DRESSINGS) ×4 IMPLANT
BANDAGE ELASTIC 6 VELCRO ST LF (GAUZE/BANDAGES/DRESSINGS) ×4 IMPLANT
BANDAGE ESMARK 6X9 LF (GAUZE/BANDAGES/DRESSINGS) ×2 IMPLANT
BENZOIN TINCTURE PRP APPL 2/3 (GAUZE/BANDAGES/DRESSINGS) ×4 IMPLANT
BLADE SAGITTAL 25.0X1.19X90 (BLADE) ×3 IMPLANT
BLADE SAGITTAL 25.0X1.19X90MM (BLADE) ×1
BLADE SAW SGTL 13.0X1.19X90.0M (BLADE) ×4 IMPLANT
BLADE SURG ROTATE 9660 (MISCELLANEOUS) IMPLANT
BNDG ELASTIC 6X10 VLCR STRL LF (GAUZE/BANDAGES/DRESSINGS) ×4 IMPLANT
BNDG ESMARK 6X9 LF (GAUZE/BANDAGES/DRESSINGS) ×4
BNDG GAUZE ELAST 4 BULKY (GAUZE/BANDAGES/DRESSINGS) ×4 IMPLANT
BOWL SMART MIX CTS (DISPOSABLE) ×4 IMPLANT
CAP KNEE TOTAL 3 SIGMA ×4 IMPLANT
CEMENT HV SMART SET (Cement) ×8 IMPLANT
CLOSURE STERI-STRIP 1/2X4 (GAUZE/BANDAGES/DRESSINGS) ×1
CLOSURE WOUND 1/2 X4 (GAUZE/BANDAGES/DRESSINGS) ×1
CLSR STERI-STRIP ANTIMIC 1/2X4 (GAUZE/BANDAGES/DRESSINGS) ×3 IMPLANT
COVER SURGICAL LIGHT HANDLE (MISCELLANEOUS) ×4 IMPLANT
CUFF TOURNIQUET SINGLE 34IN LL (TOURNIQUET CUFF) ×4 IMPLANT
CUFF TOURNIQUET SINGLE 44IN (TOURNIQUET CUFF) IMPLANT
DRAPE EXTREMITY T 121X128X90 (DRAPE) ×4 IMPLANT
DRAPE IMP U-DRAPE 54X76 (DRAPES) ×4 IMPLANT
DRAPE PROXIMA HALF (DRAPES) ×4 IMPLANT
DRAPE U-SHAPE 47X51 STRL (DRAPES) ×4 IMPLANT
DRSG ADAPTIC 3X8 NADH LF (GAUZE/BANDAGES/DRESSINGS) ×4 IMPLANT
DRSG PAD ABDOMINAL 8X10 ST (GAUZE/BANDAGES/DRESSINGS) ×4 IMPLANT
DURAPREP 26ML APPLICATOR (WOUND CARE) ×4 IMPLANT
ELECT REM PT RETURN 9FT ADLT (ELECTROSURGICAL) ×4
ELECTRODE REM PT RTRN 9FT ADLT (ELECTROSURGICAL) ×2 IMPLANT
GAUZE SPONGE 4X4 12PLY STRL (GAUZE/BANDAGES/DRESSINGS) ×4 IMPLANT
GLOVE BIO SURGEON STRL SZ8 (GLOVE) ×8 IMPLANT
GLOVE BIOGEL PI IND STRL 8 (GLOVE) ×4 IMPLANT
GLOVE BIOGEL PI INDICATOR 8 (GLOVE) ×4
GOWN STRL REUS W/ TWL LRG LVL3 (GOWN DISPOSABLE) ×2 IMPLANT
GOWN STRL REUS W/ TWL XL LVL3 (GOWN DISPOSABLE) ×4 IMPLANT
GOWN STRL REUS W/TWL LRG LVL3 (GOWN DISPOSABLE) ×2
GOWN STRL REUS W/TWL XL LVL3 (GOWN DISPOSABLE) ×4
HANDPIECE INTERPULSE COAX TIP (DISPOSABLE) ×2
HOOD PEEL AWAY FACE SHEILD DIS (HOOD) ×8 IMPLANT
IMMOBILIZER KNEE 20 (SOFTGOODS) IMPLANT
IMMOBILIZER KNEE 22 UNIV (SOFTGOODS) ×4 IMPLANT
IMMOBILIZER KNEE 24 THIGH 36 (MISCELLANEOUS) IMPLANT
IMMOBILIZER KNEE 24 UNIV (MISCELLANEOUS)
KIT BASIN OR (CUSTOM PROCEDURE TRAY) ×4 IMPLANT
KIT ROOM TURNOVER OR (KITS) ×4 IMPLANT
MANIFOLD NEPTUNE II (INSTRUMENTS) ×4 IMPLANT
NEEDLE HYPO 21X1 ECLIPSE (NEEDLE) ×4 IMPLANT
NS IRRIG 1000ML POUR BTL (IV SOLUTION) ×4 IMPLANT
PACK TOTAL JOINT (CUSTOM PROCEDURE TRAY) ×4 IMPLANT
PACK UNIVERSAL I (CUSTOM PROCEDURE TRAY) ×4 IMPLANT
PAD ABD 8X10 STRL (GAUZE/BANDAGES/DRESSINGS) ×4 IMPLANT
PAD ARMBOARD 7.5X6 YLW CONV (MISCELLANEOUS) ×8 IMPLANT
SET HNDPC FAN SPRY TIP SCT (DISPOSABLE) ×2 IMPLANT
STAPLER VISISTAT 35W (STAPLE) IMPLANT
STRIP CLOSURE SKIN 1/2X4 (GAUZE/BANDAGES/DRESSINGS) ×3 IMPLANT
SUCTION FRAZIER TIP 10 FR DISP (SUCTIONS) ×4 IMPLANT
SUT MNCRL AB 3-0 PS2 18 (SUTURE) ×4 IMPLANT
SUT VIC AB 0 CT1 27 (SUTURE) ×4
SUT VIC AB 0 CT1 27XBRD ANBCTR (SUTURE) ×4 IMPLANT
SUT VIC AB 2-0 CT1 27 (SUTURE) ×4
SUT VIC AB 2-0 CT1 TAPERPNT 27 (SUTURE) ×4 IMPLANT
SUT VLOC 180 0 24IN GS25 (SUTURE) ×4 IMPLANT
SYR 50ML LL SCALE MARK (SYRINGE) ×4 IMPLANT
TOWEL OR 17X24 6PK STRL BLUE (TOWEL DISPOSABLE) ×4 IMPLANT
TOWEL OR 17X26 10 PK STRL BLUE (TOWEL DISPOSABLE) ×4 IMPLANT
TRAY FOLEY CATH 14FR (SET/KITS/TRAYS/PACK) IMPLANT
UPCHARGE REV TRAY MBT KNEE ×4 IMPLANT
WATER STERILE IRR 1000ML POUR (IV SOLUTION) ×8 IMPLANT

## 2014-08-14 NOTE — Anesthesia Procedure Notes (Addendum)
Anesthesia Regional Block:  Adductor canal block  Pre-Anesthetic Checklist: ,, timeout performed, Correct Patient, Correct Site, Correct Laterality, Correct Procedure, Correct Position, site marked, Risks and benefits discussed, Surgical consent,  Pre-op evaluation,  Post-op pain management  Laterality: Left  Prep: chloraprep       Needles:  Injection technique: Single-shot  Needle Type: Stimiplex     Needle Length: 9cm 9 cm Needle Gauge: 21 and 21 G    Additional Needles:  Procedures: ultrasound guided (picture in chart) Adductor canal block Narrative:  Injection made incrementally with aspirations every 5 mL.  Performed by: Personally  Anesthesiologist: Nolon Nations  Additional Notes: BP cuff, EKG monitors applied. Sedation begun. Artery and nerve location verified with U/S and anesthetic injected incrementally, slowly, and after negative aspirations under direct u/s guidance. Good fascial /perineural spread. Tolerated well.   Procedure Name: Intubation Date/Time: 08/14/2014 1:09 PM Performed by: Izora Gala Pre-anesthesia Checklist: Patient identified, Emergency Drugs available, Suction available and Patient being monitored Patient Re-evaluated:Patient Re-evaluated prior to inductionOxygen Delivery Method: Circle system utilized Preoxygenation: Pre-oxygenation with 100% oxygen Intubation Type: IV induction Ventilation: Mask ventilation without difficulty Laryngoscope Size: Miller and 3 Grade View: Grade II Tube type: Oral Tube size: 7.5 mm Number of attempts: 1 Airway Equipment and Method: Stylet and LTA kit utilized Placement Confirmation: ETT inserted through vocal cords under direct vision,  positive ETCO2 and breath sounds checked- equal and bilateral Secured at: 23 cm Tube secured with: Tape Dental Injury: Teeth and Oropharynx as per pre-operative assessment

## 2014-08-14 NOTE — Interval H&P Note (Signed)
OK for surgery PD 

## 2014-08-14 NOTE — Anesthesia Postprocedure Evaluation (Signed)
Anesthesia Post Note  Patient: Breanna [name]  Procedure(s) Performed: Procedure(s) (LRB): TOTAL KNEE ARTHROPLASTY (Left) KNEE INJECTION (Right)  Anesthesia type: general  Patient location: PACU  Post pain: Pain level controlled  Post assessment: Patient's Cardiovascular Status Stable  Last Vitals:  Filed Vitals:   08/14/14 1700  BP: 132/53  Pulse: 59  Temp:   Resp: 14    Post vital signs: Reviewed and stable  Level of consciousness: sedated  Complications: No apparent anesthesia complications

## 2014-08-14 NOTE — Anesthesia Preprocedure Evaluation (Addendum)
Anesthesia Evaluation  Patient identified by MRN, date of birth, ID band Patient awake    Reviewed: Allergy & Precautions, NPO status , Patient's Chart, lab work & pertinent test results, reviewed documented beta blocker date and time   Airway Mallampati: II  TM Distance: >3 FB Neck ROM: Full    Dental no notable dental hx.    Pulmonary shortness of breath, former smoker,  breath sounds clear to auscultation  Pulmonary exam normal       Cardiovascular hypertension, Pt. on medications and Pt. on home beta blockers Normal cardiovascular examRhythm:Regular Rate:Normal     Neuro/Psych PSYCHIATRIC DISORDERS Anxiety CVA negative neurological ROS     GI/Hepatic Neg liver ROS, GERD-  Medicated,  Endo/Other  negative endocrine ROS  Renal/GU negative Renal ROS     Musculoskeletal  (+) Arthritis -,   Abdominal (+) + obese,   Peds  Hematology negative hematology ROS (+)   Anesthesia Other Findings   Reproductive/Obstetrics negative OB ROS                            Anesthesia Physical Anesthesia Plan  ASA: II  Anesthesia Plan: General and Regional   Post-op Pain Management:    Induction: Intravenous  Airway Management Planned: Oral ETT  Additional Equipment:   Intra-op Plan:   Post-operative Plan: Extubation in OR  Informed Consent: I have reviewed the patients History and Physical, chart, labs and discussed the procedure including the risks, benefits and alternatives for the proposed anesthesia with the patient or authorized representative who has indicated his/her understanding and acceptance.   Dental advisory given  Plan Discussed with: CRNA  Anesthesia Plan Comments:        Anesthesia Quick Evaluation

## 2014-08-14 NOTE — Transfer of Care (Signed)
Immediate Anesthesia Transfer of Care Note  Patient: Breanna [name]  Procedure(s) Performed: Procedure(s) with comments: TOTAL KNEE ARTHROPLASTY (Left) - FIRST ADD ON FOR DR. DALLDORF KNEE INJECTION (Right)  Patient Location: PACU  Anesthesia Type:General and Regional  Level of Consciousness: awake, alert , oriented and patient cooperative  Airway & Oxygen Therapy: Patient Spontanous Breathing and Patient connected to face mask oxygen  Post-op Assessment: Report given to RN, Post -op Vital signs reviewed and stable, Patient moving all extremities and Patient moving all extremities X 4  Post vital signs: Reviewed and stable  Last Vitals:  Filed Vitals:   08/14/14 1515  BP:   Pulse:   Temp: 36.1 C  Resp:     Complications: No apparent anesthesia complications

## 2014-08-14 NOTE — Progress Notes (Signed)
Orthopedic Tech Progress Note Patient Details:  Breanna [name] [birth year] [id number]  CPM Left Knee CPM Left Knee: On Left Knee Flexion (Degrees): 90 Left Knee Extension (Degrees): 0  Ortho Devices Ortho Device/Splint Location: applied ohf to bed Ortho Device/Splint Interventions: Ordered, Application, Adjustment   Braulio Bosch 08/14/2014, 5:12 PM

## 2014-08-14 NOTE — Op Note (Signed)
PREOP DIAGNOSIS: DJD LEFT KNEE POSTOP DIAGNOSIS:  same PROCEDURE: LEFT TKR ANESTHESIA: General and block ATTENDING SURGEON: Tekia Waterbury G ASSISTANTLoni Dolly PA  INDICATIONS FOR PROCEDURE: Breanna [name] is a 69 y.o. female who has struggled for a long time with pain due to degenerative arthritis of the left knee.  The patient has failed many conservative non-operative measures and at this point has pain which limits the ability to sleep and walk.  The patient is offered total knee replacement.  Informed operative consent was obtained after discussion of possible risks of anesthesia, infection, neurovascular injury, DVT, and death.  The importance of the post-operative rehabilitation protocol to optimize result was stressed extensively with the patient.  SUMMARY OF FINDINGS AND PROCEDURE:  Breanna [name] was taken to the operative suite where under the above anesthesia a left knee replacement was performed.  There were advanced degenerative changes and the bone quality was good.  We used the DePuyLCS system and placed size standard femur, 3 MBT revision tray tibia, 32 mm all polyethylene patella, and a size 10 mm spacer.  Loni Dolly PA-C assisted throughout and was invaluable to the completion of the case in that he helped retract and maintain exposure while I placed the components.  He also helped close thereby minimizing OR time.  The patient was admitted for appropriate post-op care to include perioperative antibiotics and mechanical and pharmacologic measures for DVT prophylaxis.  DESCRIPTION OF PROCEDURE:  Breanna [name] was taken to the operative suite where the above anesthesia was applied.  The patient was positioned supine and prepped and draped in normal sterile fashion.  An appropriate time out was performed.  After the administration of kefzol pre-op antibiotic the leg was elevated and exsanguinated and a tourniquet inflated.  A standard longitudinal incision was made on the anterior knee.   Dissection was carried down to the extensor mechanism.  All appropriate anti-infective measures were used including the pre-operative antibiotic, betadine impregnated drape, and closed hooded exhaust systems for each member of the surgical team.  A medial parapatellar incision was made in the extensor mechanism and the knee cap flipped and the knee flexed.  Some residual meniscal tissues were removed along with any remaining ACL/PCL tissue.  A guide was placed on the tibia and a flat cut was made on it's superior surface.  An intramedullary guide was placed in the femur and was utilized to make anterior and posterior cuts creating an appropriate flexion gap.  A second intramedullary guide was placed in the femur to make a distal cut properly balancing the knee with an extension gap equal to the flexion gap.  The three bones sized to the above mentioned sizes and the appropriate guides were placed and utilized.  A trial reduction was done and the knee easily came to full extension and the patella tracked well on flexion.  The trial components were removed and all bones were cleaned with pulsatile lavage and then dried thoroughly.  Cement was mixed and was pressurized onto the bones followed by placement of the aforementioned components.  Excess cement was trimmed and pressure was held on the components until the cement had hardened.  The tourniquet was deflated and a small amount of bleeding was controlled with cautery and pressure.  The knee was irrigated thoroughly.  The extensor mechanism was re-approximated with V-loc suture in running fashion.  The knee was flexed and the repair was solid.  The subcutaneous tissues were re-approximated with #0 and #2-0 vicryl and the skin closed  with a subcuticular stitch and steristrips.  A sterile dressing was applied.  Intraoperative fluids, EBL, and tourniquet time can be obtained from anesthesia records.  DISPOSITION:  The patient was taken to recovery room in stable  condition and admitted for appropriate post-op care to include peri-operative antibiotic and DVT prophylaxis with mechanical and pharmacologic measures.  Ignacio Lowder G 08/14/2014, 2:42 PM

## 2014-08-15 ENCOUNTER — Encounter (HOSPITAL_COMMUNITY): Payer: Self-pay | Admitting: Orthopaedic Surgery

## 2014-08-15 LAB — BASIC METABOLIC PANEL
ANION GAP: 11 (ref 5–15)
BUN: 14 mg/dL (ref 6–20)
CO2: 28 mmol/L (ref 22–32)
CREATININE: 0.75 mg/dL (ref 0.44–1.00)
Calcium: 8 mg/dL — ABNORMAL LOW (ref 8.9–10.3)
Chloride: 95 mmol/L — ABNORMAL LOW (ref 101–111)
GFR calc non Af Amer: >60 mL/min (ref >60)
GLUCOSE: 123 mg/dL — AB (ref 65–99)
POTASSIUM: 3.4 mmol/L — AB (ref 3.5–5.1)
Sodium: 134 mmol/L — ABNORMAL LOW (ref 135–145)

## 2014-08-15 LAB — CBC
HCT: 34.6 % — ABNORMAL LOW (ref 36.0–46.0)
Hemoglobin: 11.4 g/dL — ABNORMAL LOW (ref 12.0–15.0)
MCH: 31.2 pg (ref 26.0–34.0)
MCHC: 32.9 g/dL (ref 30.0–36.0)
MCV: 94.8 fL (ref 78.0–100.0)
Platelets: 235 10*3/uL (ref 150–400)
RBC: 3.65 MIL/uL — AB (ref 3.87–5.11)
RDW: 13.1 % (ref 11.5–15.5)
WBC: 10 10*3/uL (ref 4.0–10.5)

## 2014-08-15 MED ORDER — OXYCODONE HCL 5 MG PO TABS
5.0000 mg | ORAL_TABLET | ORAL | Status: DC | PRN
  Administered 2014-08-15 – 2014-08-16 (×4): 10 mg via ORAL
  Administered 2014-08-16: 5 mg via ORAL
  Administered 2014-08-16: 10 mg via ORAL
  Filled 2014-08-15 (×4): qty 2
  Filled 2014-08-15 (×2): qty 1
  Filled 2014-08-15: qty 2

## 2014-08-15 MED ORDER — TRAMADOL HCL 50 MG PO TABS
50.0000 mg | ORAL_TABLET | Freq: Four times a day (QID) | ORAL | Status: DC
  Administered 2014-08-15 – 2014-08-16 (×5): 100 mg via ORAL
  Filled 2014-08-15 (×6): qty 2

## 2014-08-15 NOTE — Progress Notes (Signed)
Subjective: 1 Day Post-Op Procedure(s) (LRB): TOTAL KNEE ARTHROPLASTY (Left) KNEE INJECTION (Right)  Activity level:  wbat Diet tolerance:  ok Voiding:  ok Patient reports pain as mild and moderate.    Objective: Vital signs in last 24 hours: Temp:  [97 F (36.1 C)-98.4 F (36.9 C)] 97.5 F (36.4 C) (06/08 0523) Pulse Rate:  [55-71] 62 (06/08 0523) Resp:  [4-19] 18 (06/08 0523) BP: (110-159)/(49-87) 110/54 mmHg (06/08 0523) SpO2:  [90 %-100 %] 90 % (06/08 0523) Weight:  [97.478 kg (214 lb 14.4 oz)] 97.478 kg (214 lb 14.4 oz) (06/07 1105)  Labs:  Recent Labs  08/15/14 0515  HGB 11.4*    Recent Labs  08/15/14 0515  WBC 10.0  RBC 3.65*  HCT 34.6*  PLT 235    Recent Labs  08/15/14 0515  NA 134*  K 3.4*  CL 95*  CO2 28  BUN 14  CREATININE 0.75  GLUCOSE 123*  CALCIUM 8.0*   No results for input(s): LABPT, INR in the last 72 hours.  Physical Exam:  Neurologically intact ABD soft Neurovascular intact Sensation intact distally Intact pulses distally Dorsiflexion/Plantar flexion intact Incision: dressing C/D/I and no drainage No cellulitis present Compartment soft  Assessment/Plan:  1 Day Post-Op Procedure(s) (LRB): TOTAL KNEE ARTHROPLASTY (Left) KNEE INJECTION (Right) Advance diet Up with therapy D/C IV fluids Plan for discharge tomorrow Discharge home with home health if doing well and and cleared by PT. Will change dressing to aquacel tomorrow. Will switch pain meds to tramadol as norco makes patient "severly depressed" Follow up in office 2 weeks post op. Continue on ASA 325mg  BID x 2 weeks post op along with plavix for DVT prevention.    Kiondra Caicedo, Larwance Sachs 08/15/2014, 7:45 AM

## 2014-08-15 NOTE — Anesthesia Postprocedure Evaluation (Signed)
Anesthesia Post Note  Patient: Breanna [name]  Procedure(s) Performed: Procedure(s) (LRB): TOTAL KNEE ARTHROPLASTY (Left) KNEE INJECTION (Right)  Anesthesia type: General  Patient location: PACU  Post pain: Pain level controlled  Post assessment: Post-op Vital signs reviewed  Last Vitals: BP 143/51 mmHg  Pulse 80  Temp(Src) 37.1 C (Oral)  Resp 19  Ht 5\' 5"  (1.651 m)  Wt 214 lb 14.4 oz (97.478 kg)  BMI 35.76 kg/m2  SpO2 92%  Post vital signs: Reviewed  Level of consciousness: sedated  Complications: No apparent anesthesia complications

## 2014-08-15 NOTE — Evaluation (Signed)
Physical Therapy Evaluation Patient Details Name: Breanna [name] MRN: [id number] DOB: 27-Aug-1945 Today's Date: 08/15/2014   History of Present Illness  Patient is a 69 y/o female s/p L TKA and R knee injection. PMH includes CVA, HTN, panic attacks and DDD.  Clinical Impression  Patient presents with pain and post surgical deficits LLE s/p above surgery impacting safe mobility. Reviewed HEP and precautions. Pt with high pain tolerance and pain does not seem to be controlled per pt report. Would benefit from skilled PT to maximize independence and mobility prior to return home.    Follow Up Recommendations Home health PT;Supervision/Assistance - 24 hour    Equipment Recommendations  None recommended by PT    Recommendations for Other Services       Precautions / Restrictions Precautions Precautions: Fall;Knee Precaution Booklet Issued: Yes (comment) Precaution Comments: Reviewed no pillow under knee and HEP. Required Braces or Orthoses: Knee Immobilizer - Left Restrictions Weight Bearing Restrictions: Yes LLE Weight Bearing: Weight bearing as tolerated      Mobility  Bed Mobility Overal bed mobility: Needs Assistance Bed Mobility: Supine to Sit     Supine to sit: Min guard;HOB elevated     General bed mobility comments: Cues for technique. No physical assist needed.  Transfers Overall transfer level: Needs assistance Equipment used: Rolling walker (2 wheeled) Transfers: Sit to/from Stand Sit to Stand: Min assist         General transfer comment: Min A to rise from EOB x1. Cues for hand placement and technique. Stood from [employer], from toilet x1. Uncontrolled descent into chair despite cues.   Ambulation/Gait Ambulation/Gait assistance: Min guard Ambulation Distance (Feet): 120 Feet Assistive device: Rolling walker (2 wheeled) Gait Pattern/deviations: Step-to pattern;Step-through pattern;Decreased stance time - left;Decreased step length - right;Trunk flexed   Gait  velocity interpretation: Below normal speed for age/gender General Gait Details: Slow, steady gait. Cues for knee extension during stance phase to facilitate quad activation.  Stairs            Wheelchair Mobility    Modified Rankin (Stroke Patients Only)       Balance Overall balance assessment: Needs assistance Sitting-balance support: Feet supported;No upper extremity supported Sitting balance-Leahy Scale: Good     Standing balance support: During functional activity Standing balance-Leahy Scale: Fair                               Pertinent Vitals/Pain Pain Assessment: Faces Faces Pain Scale: Hurts even more Pain Location: left knee Pain Descriptors / Indicators: Sore;Aching;Sharp Pain Intervention(s): Limited activity within patient's tolerance;Monitored during session;Premedicated before session;Repositioned    Home Living Family/patient expects to be discharged to:: Private residence Living Arrangements: Spouse/significant other Available Help at Discharge: Family;Available 24 hours/day Type of Home: House Home Access: Level entry     Home Layout: Two level;Able to live on main level with bedroom/bathroom Home Equipment: Walker - 2 wheels      Prior Function Level of Independence: Independent               Hand Dominance        Extremity/Trunk Assessment   Upper Extremity Assessment: Defer to OT evaluation           Lower Extremity Assessment: LLE deficits/detail   LLE Deficits / Details: Limited AROM hip/knee secondary to pain and post surgery.     Communication   Communication: No difficulties  Cognition Arousal/Alertness: Awake/alert Behavior During Therapy:  WFL for tasks assessed/performed Overall Cognitive Status: Within Functional Limits for tasks assessed                      General Comments General comments (skin integrity, edema, etc.): Pt's spouse present during evaluation.    Exercises Total  Joint Exercises Ankle Circles/Pumps: Both;15 reps;Supine Quad Sets: Left;10 reps;Supine Long Arc Quad: Left;10 reps;AAROM;Seated Goniometric ROM: 14-75 degrees knee AROM.      Assessment/Plan    PT Assessment Patient needs continued PT services  PT Diagnosis Difficulty walking;Acute pain   PT Problem List Decreased strength;Pain;Decreased range of motion;Decreased activity tolerance;Decreased balance;Decreased mobility  PT Treatment Interventions Balance training;Gait training;Stair training;Functional mobility training;Therapeutic activities;Therapeutic exercise;Patient/family education   PT Goals (Current goals can be found in the Care Plan section) Acute Rehab PT Goals Patient Stated Goal: to be able to walk without pain. PT Goal Formulation: With patient/family Time For Goal Achievement: 08/29/14 Potential to Achieve Goals: Good    Frequency 7X/week   Barriers to discharge        Co-evaluation               End of Session Equipment Utilized During Treatment: Gait belt Activity Tolerance: Patient tolerated treatment well;Patient limited by pain Patient left: in chair;with call bell/phone within reach;with family/visitor present Nurse Communication: Mobility status         Time: 1136-1206 PT Time Calculation (min) (ACUTE ONLY): 30 min   Charges:   PT Evaluation $Initial PT Evaluation Tier I: 1 Procedure PT Treatments $Gait Training: 8-22 mins   PT G Codes:        Tkai Serfass A Manie Bealer 08/15/2014, 1:14 PM Wray Kearns, PT, DPT [phone]

## 2014-08-15 NOTE — Evaluation (Signed)
Occupational Therapy Evaluation Patient Details Name: Breanna [name] MRN: [id number] DOB: 04-19-1945 Today's Date: 08/15/2014    History of Present Illness Patient is a 69 y/o female s/p L TKA and R knee injection. PMH includes CVA, HTN, panic attacks and DDD.   Clinical Impression   Pt admitted with above. She demonstrates the below listed deficits and will benefit from continued OT to maximize safety and independence with BADLs.  Pt currently requires mod A for LB ADLs.  SHe has all DME and AE at home. Spouse is supportive, but had lumbar fusion ~ 2 mos ago so is unable to assist her physically at discharge.   Will follow acutely.       Follow Up Recommendations  No OT follow up;Supervision/Assistance - 24 hour    Equipment Recommendations  None recommended by OT    Recommendations for Other Services       Precautions / Restrictions Precautions Precautions: Fall;Knee Required Braces or Orthoses: Knee Immobilizer - Left Restrictions Weight Bearing Restrictions: Yes LLE Weight Bearing: Weight bearing as tolerated      Mobility Bed Mobility Overal bed mobility: Needs Assistance Bed Mobility: Supine to Sit     Supine to sit: Min guard     General bed mobility comments: verbal cues for technique   Transfers Overall transfer level: Needs assistance Equipment used: Rolling walker (2 wheeled) Transfers: Sit to/from [employer] Sit to Stand: Min guard Stand pivot transfers: Min guard       General transfer comment: verbal cues for hand placement and walker safety - pushes walker to side before attempting to sit     Balance Overall balance assessment: Needs assistance Sitting-balance support: Feet supported Sitting balance-Leahy Scale: Good     Standing balance support: During functional activity Standing balance-Leahy Scale: Fair                              ADL Overall ADL's : Needs assistance/impaired Eating/Feeding:  Independent   Grooming: Wash/dry hands;Wash/dry face;Oral care;Brushing hair;Min guard;Standing   Upper Body Bathing: Set up;Sitting   Lower Body Bathing: Moderate assistance;Sit to/from stand   Upper Body Dressing : Set up;Sitting   Lower Body Dressing: Moderate assistance;Sit to/from stand   Toilet Transfer: Min guard;Ambulation;Comfort height toilet;RW;Grab bars Toilet Transfer Details (indicate cue type and reason): requires cues for walker safety and hand placement Toileting- Clothing Manipulation and Hygiene: Min guard;Sit to/from stand       Functional mobility during ADLs: Min guard;Rolling walker General ADL Comments: Pt unable to access Rt foot to don/doff socks. She has a sock aid available, but also reports she likely won't wear socks      Vision     Perception     Praxis      Pertinent Vitals/Pain Pain Assessment: 0-10 Pain Score: 5  Pain Location: Lt knee  Pain Descriptors / Indicators: Aching;Sore Pain Intervention(s): Monitored during session     Hand Dominance     Extremity/Trunk Assessment Upper Extremity Assessment Upper Extremity Assessment: Overall WFL for tasks assessed   Lower Extremity Assessment Lower Extremity Assessment: Defer to PT evaluation   Cervical / Trunk Assessment Cervical / Trunk Assessment: Normal   Communication Communication Communication: No difficulties   Cognition Arousal/Alertness: Awake/alert Behavior During Therapy: WFL for tasks assessed/performed Overall Cognitive Status: Within Functional Limits for tasks assessed  General Comments       Exercises       Shoulder Instructions      Home Living Family/patient expects to be discharged to:: Private residence Living Arrangements: Spouse/significant other Available Help at Discharge: Family;Available 24 hours/day Type of Home: House Home Access: Level entry     Home Layout: Two level;Able to live on main level with  bedroom/bathroom     Bathroom Shower/Tub: Walk-in shower   Bathroom Toilet: Handicapped height     Home Equipment: Environmental consultant - 2 wheels;Bedside commode;Adaptive equipment Adaptive Equipment: Reacher;Sock aid;Long-handled shoe horn;Long-handled sponge Additional Comments: spouse recently underwent back surgery and unable physically assist pt       Prior Functioning/Environment Level of Independence: Independent             OT Diagnosis: Generalized weakness;Acute pain   OT Problem List: Decreased strength;Decreased knowledge of use of DME or AE;Decreased safety awareness;Decreased knowledge of precautions;Pain   OT Treatment/Interventions: Self-care/ADL training;DME and/or AE instruction;Therapeutic activities;Balance training;Patient/family education    OT Goals(Current goals can be found in the care plan section) Acute Rehab OT Goals Patient Stated Goal: to go home  OT Goal Formulation: With patient Time For Goal Achievement: 08/29/14 Potential to Achieve Goals: Good ADL Goals Pt Will Perform Grooming: with supervision;standing Pt Will Perform Lower Body Bathing: with supervision;sit to/from stand;with adaptive equipment Pt Will Perform Lower Body Dressing: with supervision;with adaptive equipment;sit to/from stand Pt Will Transfer to Toilet: with supervision;ambulating;bedside commode;grab bars Pt Will Perform Toileting - Clothing Manipulation and hygiene: with supervision;sit to/from stand Pt Will Perform Tub/Shower Transfer: Shower transfer;with min guard assist;ambulating;3 in 1;rolling walker  OT Frequency: Min 2X/week   Barriers to D/C: Decreased caregiver support  spouse with recent back surgery q       Co-evaluation              End of Session Equipment Utilized During Treatment: Rolling walker CPM Left Knee CPM Left Knee: Off Nurse Communication: Mobility status  Activity Tolerance: Patient tolerated treatment well Patient left: in chair;with call  bell/phone within reach   Time: 1740-8144 OT Time Calculation (min): 19 min Charges:  OT General Charges $OT Visit: 1 Procedure OT Evaluation $Initial OT Evaluation Tier I: 1 Procedure G-Codes:    Breanna [name], Ellard Artis M 2014/08/24, 5:20 PM

## 2014-08-16 ENCOUNTER — Encounter (HOSPITAL_COMMUNITY): Payer: Self-pay | Admitting: General Practice

## 2014-08-16 LAB — CBC
HCT: 29.7 % — ABNORMAL LOW (ref 36.0–46.0)
HEMOGLOBIN: 10 g/dL — AB (ref 12.0–15.0)
MCH: 32.1 pg (ref 26.0–34.0)
MCHC: 33.7 g/dL (ref 30.0–36.0)
MCV: 95.2 fL (ref 78.0–100.0)
Platelets: 194 10*3/uL (ref 150–400)
RBC: 3.12 MIL/uL — AB (ref 3.87–5.11)
RDW: 13.4 % (ref 11.5–15.5)
WBC: 7.6 10*3/uL (ref 4.0–10.5)

## 2014-08-16 MED ORDER — ASPIRIN 325 MG PO TBEC: 325.0000 mg | DELAYED_RELEASE_TABLET | Freq: Two times a day (BID) | ORAL | Status: DC

## 2014-08-16 MED ORDER — METHOCARBAMOL 500 MG PO TABS: 500.0000 mg | ORAL_TABLET | Freq: Four times a day (QID) | ORAL | Status: DC | PRN

## 2014-08-16 MED ORDER — OXYCODONE-ACETAMINOPHEN 5-325 MG PO TABS: 1.0000 | ORAL_TABLET | ORAL | Status: DC | PRN

## 2014-08-16 NOTE — Progress Notes (Signed)
Occupational Therapy Treatment Patient Details Name: Breanna [name] MRN: [id number] DOB: 1946-01-29 Today's Date: 08/16/2014    History of present illness Patient is a 69 y/o female s/p L TKA and R knee injection. PMH includes CVA, HTN, panic attacks and DDD.   OT comments  Pt with increased pain this session.  Min guard for safety with ambulation and min guard to min A for LB adls.  Pt verbalizes shower sequence  Follow Up Recommendations  No OT follow up;Supervision/Assistance - 24 hour    Equipment Recommendations  None recommended by OT    Recommendations for Other Services      Precautions / Restrictions Precautions Precautions: Fall;Knee Precaution Booklet Issued: Yes (comment) Precaution Comments: Reviewed no pillow under knee and HEP. Required Braces or Orthoses: Knee Immobilizer - Left Restrictions Weight Bearing Restrictions: Yes LLE Weight Bearing: Weight bearing as tolerated       Mobility Bed Mobility Overal bed mobility: Needs Assistance Bed Mobility: Sit to Supine     Supine to sit: Min guard Sit to supine: Min guard   General bed mobility comments: from flat bed:  pt managed LLE but had pain.  Educated that she can use sheet to minimize pain, if she wants to try this  Transfers Overall transfer level: Needs assistance Equipment used: Rolling walker (2 wheeled) Transfers: Sit to/from Stand Sit to Stand: Min guard         General transfer comment: for safety    Balance Overall balance assessment: Needs assistance Sitting-balance support: Feet supported;No upper extremity supported Sitting balance-Leahy Scale: Good     Standing balance support: During functional activity Standing balance-Leahy Scale: Fair                     ADL       Grooming: Wash/dry hands;Supervision/safety;Standing;Wash/dry face       Lower Body Bathing: Min guard;Sit to/from stand (simulated AE)       Lower Body Dressing: Minimal assistance;Sit to/from  stand;With adaptive equipment (pants only; pt has been using AE--had ace wrap still on LLE)   Toilet Transfer: Min guard;Ambulation;Comfort height toilet;RW;Grab bars   Toileting- Clothing Manipulation and Hygiene: Min guard;Sit to/from stand         General ADL Comments: pt with increased pain, but willing to get up and use bathroom and complete ADL.  Reviewed shower sequence, but did not practice.  Pt verbalizes understanding      Vision                     Perception     Praxis      Cognition   Behavior During Therapy: WFL for tasks assessed/performed Overall Cognitive Status: Within Functional Limits for tasks assessed                       Extremity/Trunk Assessment               Exercises    Shoulder Instructions       General Comments      Pertinent Vitals/ Pain       Pain Assessment: 0-10 Pain Score:  (8 1/2) Pain Location: L knee Pain Descriptors / Indicators: Aching;Sore Pain Intervention(s): Limited activity within patient's tolerance;Monitored during session;Premedicated before session;Repositioned;Patient requesting pain meds-RN notified  Home Living  Prior Functioning/Environment              Frequency Min 2X/week     Progress Toward Goals  OT Goals(current goals can now be found in the care plan section)  Progress towards OT goals: Progressing toward goals     Plan      Co-evaluation                 End of Session CPM Left Knee CPM Left Knee: Off   Activity Tolerance Patient tolerated treatment well   Patient Left in chair;with call bell/phone within reach   Nurse Communication          Time: 8182-9937 OT Time Calculation (min): 30 min  Charges: OT General Charges $OT Visit: 1 Procedure OT Treatments $Self Care/Home Management : 23-37 mins  Archana Eckman 08/16/2014, 1:16 PM  Lesle Chris, OTR/L [phone] 08/16/2014

## 2014-08-16 NOTE — Progress Notes (Signed)
Reviewed d/c instructions and Rx's. Live in [place], [place]. Concerned will not arrive to their [employer] before it closes at 7:00 PM. Recommended going straight down [employer]. [place], will lead to [employer]. Provided phone number and street address - her husband stated they would enter address into his GPS. Both very thankful idea to obtain her pain Rx locally - both worried she would be w/o pain Rx until AM. Tech took via w/c to [employer] exit - husband parked in handicapped parking - all belongings and DME taken.

## 2014-08-16 NOTE — Progress Notes (Signed)
Physical Therapy Treatment Patient Details Name: Breanna [name] MRN: [id number] DOB: [birth year] Today's Date: 08/16/2014    History of Present Illness Patient is a 69 y/o female s/p L TKA and R knee injection. PMH includes CVA, HTN, panic attacks and DDD.    PT Comments    Patient progressing well towards PT goals. Pain more controlled today after switching to oxycodone. Improved ambulation distance. Emphasized importance of maintaining knee in extension at all times as tolerated. PM session will focus on there ex and ambulation to prepare for d/c home. Will continue to follow.   Follow Up Recommendations  Home health PT;Supervision/Assistance - 24 hour     Equipment Recommendations  None recommended by PT    Recommendations for Other Services       Precautions / Restrictions Precautions Precautions: Fall;Knee Precaution Booklet Issued: Yes (comment) Precaution Comments: Reviewed no pillow under knee and HEP. Required Braces or Orthoses: Knee Immobilizer - Left Restrictions Weight Bearing Restrictions: Yes LLE Weight Bearing: Weight bearing as tolerated    Mobility  Bed Mobility Overal bed mobility: Needs Assistance Bed Mobility: Sit to Supine       Sit to supine: Min guard   General bed mobility comments: verbal cues for technique, no physical assist needed. HOB flat, no rails.  Transfers Overall transfer level: Needs assistance Equipment used: Rolling walker (2 wheeled) Transfers: Sit to/from Stand Sit to Stand: Min guard         General transfer comment: Min guard for safety.   Ambulation/Gait Ambulation/Gait assistance: Min guard Ambulation Distance (Feet): 150 Feet Assistive device: Rolling walker (2 wheeled) Gait Pattern/deviations: Decreased stance time - left;Decreased step length - right;Step-through pattern;Trunk flexed;Antalgic   Gait velocity interpretation: Below normal speed for age/gender General Gait Details: Slow, steady gait. Cues for knee  extension during stance phase to facilitate quad activation.   Stairs            Wheelchair Mobility    Modified Rankin (Stroke Patients Only)       Balance Overall balance assessment: Needs assistance Sitting-balance support: Feet supported;No upper extremity supported Sitting balance-Leahy Scale: Good     Standing balance support: During functional activity Standing balance-Leahy Scale: Fair                      Cognition Arousal/Alertness: Awake/alert Behavior During Therapy: WFL for tasks assessed/performed Overall Cognitive Status: Within Functional Limits for tasks assessed                      Exercises Total Joint Exercises Ankle Circles/Pumps: Both;15 reps;Seated Quad Sets: Left;10 reps;Supine Long Arc Quad: AAROM;Left;10 reps;Seated Goniometric ROM: 10-80 dgrees knee AROM.    General Comments        Pertinent Vitals/Pain Pain Assessment: 0-10 Pain Score: 7  Pain Location: left knee Pain Descriptors / Indicators: Sore;Aching;Sharp Pain Intervention(s): Limited activity within patient's tolerance;Monitored during session;Premedicated before session;Repositioned    Home Living                      Prior Function            PT Goals (current goals can now be found in the care plan section) Progress towards PT goals: Progressing toward goals    Frequency  7X/week    PT Plan Current plan remains appropriate    Co-evaluation             End of Session Equipment Utilized During Treatment: Gait  belt;Left knee immobilizer Activity Tolerance: Patient tolerated treatment well;Patient limited by pain Patient left: in bed;with call bell/phone within reach;with family/visitor present     Time: 0921-0951 PT Time Calculation (min) (ACUTE ONLY): 30 min  Charges:  $Gait Training: 8-22 mins $Therapeutic Exercise: 8-22 mins                    G Codes:      Joda Braatz A Lenzi Marmo 08/16/2014, 10:30 AM  Wray Kearns,  [place], DPT [phone]

## 2014-08-16 NOTE — Progress Notes (Signed)
Physical Therapy Treatment Patient Details Name: Breanna [name] MRN: [id number] DOB: [birth year] Today's Date: 08/16/2014    History of Present Illness Patient is a 69 y/o female s/p L TKA and R knee injection. PMH includes CVA, HTN, panic attacks and DDD.    PT Comments    Patient progressing well towards PT goals. Continues to have increased pain in left knee, worsened with mobility/activity. Reviewed exercises to perform at home. Discussion about how to safely get into car and tips for car ride home. Pt to be discharged this PM. Will continue to follow if still in hospital.   Follow Up Recommendations  Home health PT;Supervision/Assistance - 24 hour     Equipment Recommendations  None recommended by PT    Recommendations for Other Services       Precautions / Restrictions Precautions Precautions: Fall;Knee Precaution Booklet Issued: Yes (comment) Precaution Comments: Reviewed no pillow under knee and HEP. Required Braces or Orthoses: Knee Immobilizer - Left Restrictions Weight Bearing Restrictions: Yes LLE Weight Bearing: Weight bearing as tolerated    Mobility  Bed Mobility Overal bed mobility: Needs Assistance Bed Mobility: Sit to Supine     Supine to sit: Min guard Sit to supine: Min assist   General bed mobility comments: Instructed pt to use sheet as leg lifter to bring LLE into bed. Min A due to increased pain. HOB flat.  Transfers Overall transfer level: Needs assistance Equipment used: Rolling walker (2 wheeled) Transfers: Sit to/from Stand Sit to Stand: Min guard         General transfer comment: for safety  Ambulation/Gait Ambulation/Gait assistance: Min guard Ambulation Distance (Feet): 130 Feet Assistive device: Rolling walker (2 wheeled) Gait Pattern/deviations: Step-through pattern;Decreased stance time - left;Decreased step length - right;Antalgic   Gait velocity interpretation: Below normal speed for age/gender General Gait Details: Slow,  steady gait. Cues for knee extension during stance phase to facilitate quad activation.   Stairs            Wheelchair Mobility    Modified Rankin (Stroke Patients Only)       Balance Overall balance assessment: Needs assistance Sitting-balance support: Feet supported;No upper extremity supported Sitting balance-Leahy Scale: Good     Standing balance support: During functional activity Standing balance-Leahy Scale: Fair                      Cognition Arousal/Alertness: Awake/alert Behavior During Therapy: WFL for tasks assessed/performed Overall Cognitive Status: Within Functional Limits for tasks assessed                      Exercises Total Joint Exercises Ankle Circles/Pumps: Both;15 reps;Supine Quad Sets: Left;10 reps;Supine Towel Squeeze: Both;10 reps;Supine Short Arc Quad: Left;10 reps;Supine Heel Slides: Left;10 reps;Seated Hip ABduction/ADduction: Left;10 reps;Supine Long Arc Quad: AAROM;Left;10 reps;Seated    General Comments        Pertinent Vitals/Pain Pain Assessment: 0-10 Pain Score: 8  Pain Location: left knee Pain Descriptors / Indicators: Sore;Sharp;Aching Pain Intervention(s): Monitored during session;Premedicated before session;Repositioned    Home Living                      Prior Function            PT Goals (current goals can now be found in the care plan section) Progress towards PT goals: Progressing toward goals    Frequency  7X/week    PT Plan Current plan remains appropriate    Co-evaluation  End of Session Equipment Utilized During Treatment: Gait belt;Left knee immobilizer Activity Tolerance: Patient limited by pain;Patient tolerated treatment well Patient left: in bed;with call bell/phone within reach;with bed alarm set;with family/visitor present     Time: 1325-1400 PT Time Calculation (min) (ACUTE ONLY): 35 min  Charges:  $Gait Training: 8-22 mins $Therapeutic  Exercise: 8-22 mins                    G Codes:      Breanna [name] A Breanna [name] 08/16/2014, 3:07 PM Breanna [name], [place], DPT [phone]

## 2014-08-16 NOTE — Discharge Summary (Signed)
Patient ID: Breanna [name] MRN: [id number] DOB/AGE: [birth year] 68 y.o.  Admit date: 08/14/2014 Discharge date: 08/16/2014  Admission Diagnoses:  Principal Problem:   Primary osteoarthritis of left knee Active Problems:   Primary osteoarthritis of knee   Discharge Diagnoses:  Same  Past Medical History  Diagnosis Date  . Osteoarthritis   . DDD (degenerative disc disease)   . TMJ (dislocation of temporomandibular joint)   . GERD (gastroesophageal reflux disease)   . Hypertension   . Stroke     TIA 2 years ago  on plavix  . Panic attacks     Surgeries: Procedure(s): TOTAL KNEE ARTHROPLASTY KNEE INJECTION on 08/14/2014   Consultants:    Discharged Condition: Improved  Hospital Course: Breanna [name] is an 69 y.o. female who was admitted 08/14/2014 for operative treatment ofPrimary osteoarthritis of left knee. Patient has severe unremitting pain that affects sleep, daily activities, and work/hobbies. After pre-op clearance the patient was taken to the operating room on 08/14/2014 and underwent  Procedure(s): TOTAL KNEE ARTHROPLASTY KNEE INJECTION.    Patient was given perioperative antibiotics: Anti-infectives    Start     Dose/Rate Route Frequency Ordered Stop   08/14/14 2030  ceFAZolin (ANCEF) IVPB 2 g/50 mL premix     2 g 100 mL/hr over 30 Minutes Intravenous Every 6 hours 08/14/14 2015 08/15/14 0205   08/14/14 1200  ceFAZolin (ANCEF) IVPB 2 g/50 mL premix     2 g 100 mL/hr over 30 Minutes Intravenous To Surgery 08/13/14 1231 08/14/14 1325       Patient was given sequential compression devices, early ambulation, and chemoprophylaxis to prevent DVT.  Patient benefited maximally from hospital stay and there were no complications.    Recent vital signs: Patient Vitals for the past 24 hrs:  BP Temp Temp src Pulse Resp SpO2  08/16/14 0643 (!) 133/52 mmHg 98 F (36.7 C) - 79 18 93 %  08/15/14 2129 (!) 143/51 mmHg 98.8 F (37.1 C) Oral 80 19 92 %  08/15/14 1311 (!) 103/35 mmHg  98.8 F (37.1 C) Oral 77 18 93 %     Recent laboratory studies:  Recent Labs  08/15/14 0515 08/16/14 0448  WBC 10.0 7.6  HGB 11.4* 10.0*  HCT 34.6* 29.7*  PLT 235 194  NA 134*  --   K 3.4*  --   CL 95*  --   CO2 28  --   BUN 14  --   CREATININE 0.75  --   GLUCOSE 123*  --   CALCIUM 8.0*  --      Discharge Medications:     Medication List    STOP taking these medications        HYDROcodone-acetaminophen 5-325 MG per tablet  Commonly known as:  NORCO/VICODIN     naproxen 500 MG tablet  Commonly known as:  NAPROSYN      TAKE these medications        amLODipine 5 MG tablet  Commonly known as:  NORVASC  Take 5 mg by mouth daily.     aspirin 325 MG EC tablet  Take 1 tablet (325 mg total) by mouth 2 (two) times daily after a meal.     atorvastatin 20 MG tablet  Commonly known as:  LIPITOR  Take 20 mg by mouth daily.     carvedilol 3.125 MG tablet  Commonly known as:  COREG  Take 3.125 mg by mouth 2 (two) times daily with a meal.     chlorthalidone 25 MG  tablet  Commonly known as:  HYGROTON  Take 25 mg by mouth daily.     clopidogrel 75 MG tablet  Commonly known as:  PLAVIX  Take 75 mg by mouth daily.     estradiol 1 MG tablet  Commonly known as:  ESTRACE  Take 1 mg by mouth daily.     fexofenadine 180 MG tablet  Commonly known as:  ALLEGRA  Take 180 mg by mouth daily.     lansoprazole 15 MG capsule  Commonly known as:  PREVACID  Take 15 mg by mouth daily.     lisinopril 40 MG tablet  Commonly known as:  PRINIVIL,ZESTRIL  Take 40 mg by mouth daily.     methocarbamol 500 MG tablet  Commonly known as:  ROBAXIN  Take 1 tablet (500 mg total) by mouth every 6 (six) hours as needed for muscle spasms.     multivitamin tablet  Take 1 tablet by mouth daily.     oxyCODONE-acetaminophen 5-325 MG per tablet  Commonly known as:  ROXICET  Take 1-2 tablets by mouth every 4 (four) hours as needed for moderate pain or severe pain.     PARoxetine 10 MG  tablet  Commonly known as:  PAXIL  Take 10 mg by mouth daily.     potassium chloride 10 MEQ tablet  Commonly known as:  K-DUR,KLOR-CON  Take 10 mEq by mouth 2 (two) times daily.     TYLENOL 325 MG tablet  Generic drug:  acetaminophen  Take 650 mg by mouth every 4 (four) hours as needed.        Diagnostic Studies: No results found.  Disposition: Final discharge disposition not confirmed      Discharge Instructions    Call MD / Call 911    Complete by:  As directed   If you experience chest pain or shortness of breath, CALL 911 and be transported to the hospital emergency room.  If you develope a fever above 101 F, pus (white drainage) or increased drainage or redness at the wound, or calf pain, call your surgeon's office.     Constipation Prevention    Complete by:  As directed   Drink plenty of fluids.  Prune juice may be helpful.  You may use a stool softener, such as Colace (over the counter) 100 mg twice a day.  Use MiraLax (over the counter) for constipation as needed.     Diet - low sodium heart healthy    Complete by:  As directed      Discharge instructions    Complete by:  As directed   INSTRUCTIONS AFTER JOINT REPLACEMENT   Remove items at home which could result in a fall. This includes throw rugs or furniture in walking pathways ICE to the affected joint every three hours while awake for 30 minutes at a time, for at least the first 3-5 days, and then as needed for pain and swelling.  Continue to use ice for pain and swelling. You may notice swelling that will progress down to the foot and ankle.  This is normal after surgery.  Elevate your leg when you are not up walking on it.   Continue to use the breathing machine you got in the hospital (incentive spirometer) which will help keep your temperature down.  It is common for your temperature to cycle up and down following surgery, especially at night when you are not up moving around and exerting yourself.  The breathing  machine keeps your  lungs expanded and your temperature down.   DIET:  As you were doing prior to hospitalization, we recommend a well-balanced diet.  DRESSING / WOUND CARE / SHOWERING  You may shower 3 days after surgery, but keep the wounds dry during showering.  You may use an occlusive plastic wrap (Press'n Seal for example), NO SOAKING/SUBMERGING IN THE BATHTUB.  If the bandage gets wet, change with a clean dry gauze.  If the incision gets wet, pat the wound dry with a clean towel.  ACTIVITY  Increase activity slowly as tolerated, but follow the weight bearing instructions below.   No driving for 6 weeks or until further direction given by your physician.  You cannot drive while taking narcotics.  No lifting or carrying greater than 10 lbs. until further directed by your surgeon. Avoid periods of inactivity such as sitting longer than an hour when not asleep. This helps prevent blood clots.  You may return to work once you are authorized by your doctor.     WEIGHT BEARING   Weight bearing as tolerated with assist device (walker, cane, etc) as directed, use it as long as suggested by your surgeon or therapist, typically at least 4-6 weeks.   EXERCISES  Results after joint replacement surgery are often greatly improved when you follow the exercise, range of motion and muscle strengthening exercises prescribed by your doctor. Safety measures are also important to protect the joint from further injury. Any time any of these exercises cause you to have increased pain or swelling, decrease what you are doing until you are comfortable again and then slowly increase them. If you have problems or questions, call your caregiver or physical therapist for advice.   Rehabilitation is important following a joint replacement. After just a few days of immobilization, the muscles of the leg can become weakened and shrink (atrophy).  These exercises are designed to build up the tone and strength of the  thigh and leg muscles and to improve motion. Often times heat used for twenty to thirty minutes before working out will loosen up your tissues and help with improving the range of motion but do not use heat for the first two weeks following surgery (sometimes heat can increase post-operative swelling).   These exercises can be done on a training (exercise) mat, on the floor, on a table or on a bed. Use whatever works the best and is most comfortable for you.    Use music or television while you are exercising so that the exercises are a pleasant break in your day. This will make your life better with the exercises acting as a break in your routine that you can look forward to.   Perform all exercises about fifteen times, three times per day or as directed.  You should exercise both the operative leg and the other leg as well.   Exercises include:   Quad Sets - Tighten up the muscle on the front of the thigh (Quad) and hold for 5-10 seconds.   Straight Leg Raises - With your knee straight (if you were given a brace, keep it on), lift the leg to 60 degrees, hold for 3 seconds, and slowly lower the leg.  Perform this exercise against resistance later as your leg gets stronger.  Leg Slides: Lying on your back, slowly slide your foot toward your buttocks, bending your knee up off the floor (only go as far as is comfortable). Then slowly slide your foot back down until your leg is  flat on the floor again.  Angel Wings: Lying on your back spread your legs to the side as far apart as you can without causing discomfort.  Hamstring Strength:  Lying on your back, push your heel against the floor with your leg straight by tightening up the muscles of your buttocks.  Repeat, but this time bend your knee to a comfortable angle, and push your heel against the floor.  You may put a pillow under the heel to make it more comfortable if necessary.   A rehabilitation program following joint replacement surgery can speed  recovery and prevent re-injury in the future due to weakened muscles. Contact your doctor or a physical therapist for more information on knee rehabilitation.    CONSTIPATION  Constipation is defined medically as fewer than three stools per week and severe constipation as less than one stool per week.  Even if you have a regular bowel pattern at home, your normal regimen is likely to be disrupted due to multiple reasons following surgery.  Combination of anesthesia, postoperative narcotics, change in appetite and fluid intake all can affect your bowels.   YOU MUST use at least one of the following options; they are listed in order of increasing strength to get the job done.  They are all available over the counter, and you may need to use some, POSSIBLY even all of these options:    Drink plenty of fluids (prune juice may be helpful) and high fiber foods Colace 100 mg by mouth twice a day  Senokot for constipation as directed and as needed Dulcolax (bisacodyl), take with full glass of water  Miralax (polyethylene glycol) once or twice a day as needed.  If you have tried all these things and are unable to have a bowel movement in the first 3-4 days after surgery call either your surgeon or your primary doctor.    If you experience loose stools or diarrhea, hold the medications until you stool forms back up.  If your symptoms do not get better within 1 week or if they get worse, check with your doctor.  If you experience "the worst abdominal pain ever" or develop nausea or vomiting, please contact the office immediately for further recommendations for treatment.   ITCHING:  If you experience itching with your medications, try taking only a single pain pill, or even half a pain pill at a time.  You can also use Benadryl over the counter for itching or also to help with sleep.   TED HOSE STOCKINGS:  Use stockings on both legs until for at least 2 weeks or as directed by physician office. They may be  removed at night for sleeping.  MEDICATIONS:  See your medication summary on the "After Visit Summary" that nursing will review with you.  You may have some home medications which will be placed on hold until you complete the course of blood thinner medication.  It is important for you to complete the blood thinner medication as prescribed.  PRECAUTIONS:  If you experience chest pain or shortness of breath - call 911 immediately for transfer to the hospital emergency department.   If you develop a fever greater that 101 F, purulent drainage from wound, increased redness or drainage from wound, foul odor from the wound/dressing, or calf pain - CONTACT YOUR SURGEON.  FOLLOW-UP APPOINTMENTS:  If you do not already have a post-op appointment, please call the office for an appointment to be seen by your surgeon.  Guidelines for how soon to be seen are listed in your "After Visit Summary", but are typically between 1-4 weeks after surgery.  OTHER INSTRUCTIONS:   Knee Replacement:  Do not place pillow under knee, focus on keeping the knee straight while resting. CPM instructions: 0-90 degrees, 2 hours in the morning, 2 hours in the afternoon, and 2 hours in the evening. Place foam block, curve side up under heel at all times except when in CPM or when walking.  DO NOT modify, tear, cut, or change the foam block in any way.  MAKE SURE YOU:  Understand these instructions.  Get help right away if you are not doing well or get worse.    Thank you for letting us be a part of your medical care team.  It is a privilege we respect greatly.  We hope these instructions will help you stay on track for a fast and full recovery!     Increase activity slowly as tolerated    Complete by:  As directed            Follow-up Information    Follow up with Hessie Dibble, MD. Schedule an appointment as soon as possible for a visit in 2 weeks.   Specialty:   Orthopedic Surgery   Contact information:   [place] [address] [phone]        Signed: Rich Fuchs 08/16/2014, 12:23 PM

## 2014-10-24 ENCOUNTER — Ambulatory Visit: Payer: Medicare Other | Admitting: Gastroenterology

## 2016-02-06 ENCOUNTER — Other Ambulatory Visit: Payer: Self-pay | Admitting: Orthopaedic Surgery

## 2016-02-06 DIAGNOSIS — M25511 Pain in right shoulder: Secondary | ICD-10-CM

## 2016-02-06 DIAGNOSIS — M542 Cervicalgia: Secondary | ICD-10-CM

## 2016-02-16 ENCOUNTER — Ambulatory Visit
Admission: RE | Admit: 2016-02-16 | Discharge: 2016-02-16 | Disposition: A | Discharge status: Home / Self Care | Payer: Medicare Other | Source: Ambulatory Visit | Attending: Orthopaedic Surgery | Admitting: Orthopaedic Surgery

## 2016-02-16 DIAGNOSIS — M25511 Pain in right shoulder: Secondary | ICD-10-CM

## 2016-02-16 DIAGNOSIS — M542 Cervicalgia: Secondary | ICD-10-CM

## 2016-03-11 ENCOUNTER — Other Ambulatory Visit: Payer: Self-pay | Admitting: Neurosurgery

## 2016-04-07 ENCOUNTER — Encounter (HOSPITAL_COMMUNITY)
Admission: RE | Admit: 2016-04-07 | Discharge: 2016-04-07 | Disposition: A | Discharge status: Home / Self Care | Payer: Medicare Other | Source: Ambulatory Visit | Attending: Neurosurgery | Admitting: Neurosurgery

## 2016-04-07 ENCOUNTER — Encounter (HOSPITAL_COMMUNITY): Payer: Self-pay

## 2016-04-07 ENCOUNTER — Ambulatory Visit (HOSPITAL_COMMUNITY)
Admission: RE | Admit: 2016-04-07 | Discharge: 2016-04-07 | Disposition: A | Discharge status: Home / Self Care | Payer: Medicare Other | Source: Ambulatory Visit | Attending: Neurosurgery | Admitting: Neurosurgery

## 2016-04-07 DIAGNOSIS — Z01818 Encounter for other preprocedural examination: Secondary | ICD-10-CM | POA: Insufficient documentation

## 2016-04-07 LAB — BASIC METABOLIC PANEL
ANION GAP: 10 (ref 5–15)
BUN: 16 mg/dL (ref 6–20)
CALCIUM: 9.1 mg/dL (ref 8.9–10.3)
CO2: 29 mmol/L (ref 22–32)
CREATININE: 0.71 mg/dL (ref 0.44–1.00)
Chloride: 100 mmol/L — ABNORMAL LOW (ref 101–111)
Glucose, Bld: 133 mg/dL — ABNORMAL HIGH (ref 65–99)
Potassium: 3 mmol/L — ABNORMAL LOW (ref 3.5–5.1)
Sodium: 139 mmol/L (ref 135–145)

## 2016-04-07 LAB — CBC
HCT: 40.2 % (ref 36.0–46.0)
HEMOGLOBIN: 13.4 g/dL (ref 12.0–15.0)
MCH: 31.8 pg (ref 26.0–34.0)
MCHC: 33.3 g/dL (ref 30.0–36.0)
MCV: 95.3 fL (ref 78.0–100.0)
PLATELETS: 192 10*3/uL (ref 150–400)
RBC: 4.22 MIL/uL (ref 3.87–5.11)
RDW: 13.1 % (ref 11.5–15.5)
WBC: 8.8 10*3/uL (ref 4.0–10.5)

## 2016-04-07 LAB — TYPE AND SCREEN
ABO/RH(D): O NEG
ANTIBODY SCREEN: NEGATIVE

## 2016-04-07 LAB — SURGICAL PCR SCREEN
MRSA, PCR: NEGATIVE
STAPHYLOCOCCUS AUREUS: NEGATIVE

## 2016-04-07 NOTE — Pre-Procedure Instructions (Signed)
Breanna [name]  04/07/2016      [place], [place] [address] Phone: [phone] Fax: [phone]    Your procedure is scheduled on 04/15/2016  Report to [facility] Admitting at 6:30 A.M.  Call this number if you have problems the morning of surgery:  [phone]   Remember:  Do not eat food or drink liquids after midnight.  Tuesday   Take these medicines the morning of surgery with A SIP OF WATER :Amlodipine, Carvedilol, Dexilant, Estradiol. Tramadol   Do not wear jewelry, make-up or nail polish.   Do not wear lotions, powders, or perfumes, or deoderant.   Do not shave 48 hours prior to surgery.      Do not bring valuables to the hospital.   [facility] is not responsible for any belongings or valuables.  Contacts, dentures or bridgework may not be worn into surgery.  Leave your suitcase in the car.  After surgery it may be brought to your room.  For patients admitted to the hospital, discharge time will be determined by your treatment team.  Patients discharged the day of surgery will not be allowed to drive home.   Name and phone number of your driver:   With family  Special instructions:  Special Instructions: [facility] - Preparing for Surgery  Before surgery, you can play an important role.  Because skin is not sterile, your skin needs to be as free of germs as possible.  You can reduce the number of germs on you skin by washing with CHG (chlorahexidine gluconate) soap before surgery.  CHG is an antiseptic cleaner which kills germs and bonds with the skin to continue killing germs even after washing.  Please DO NOT use if you have an allergy to CHG or antibacterial soaps.  If your skin becomes reddened/irritated stop using the CHG and inform your nurse when you arrive at Short Stay.  Do not shave (including legs and underarms) for at least 48 hours prior to the first CHG shower.  You  may shave your face.  Please follow these instructions carefully:   1.  Shower with CHG Soap the night before surgery and the  morning of Surgery.  2.  If you choose to wash your hair, wash your hair first as usual with your  normal shampoo.  3.  After you shampoo, rinse your hair and body thoroughly to remove the  Shampoo.  4.  Use CHG as you would any other liquid soap.  You can apply chg directly to the skin and wash gently with scrungie or a clean washcloth.  5.  Apply the CHG Soap to your body ONLY FROM THE NECK DOWN.    Do not use on open wounds or open sores.  Avoid contact with your eyes, ears, mouth and genitals (private parts).  Wash genitals (private parts)   with your normal soap.  6.  Wash thoroughly, paying special attention to the area where your surgery will be performed.  7.  Thoroughly rinse your body with warm water from the neck down.  8.  DO NOT shower/wash with your normal soap after using and rinsing off   the CHG Soap.  9.  Pat yourself dry with a clean towel.            10.  Wear clean pajamas.            11.  Place clean  sheets on your bed the night of your first shower and do not sleep with pets.  Day of Surgery  Do not apply any lotions/deodorants the morning of surgery.  Please wear clean clothes to the hospital/surgery center.  Please read over the following fact sheets that you were given. Pain Booklet, Coughing and Deep Breathing, MRSA Information and Surgical Site Infection Prevention

## 2016-04-07 NOTE — Progress Notes (Signed)
Pt. Followed by Dr. Laverda Page in [place], she remarks that starting early Dec. She has been treated with 3 courses of antibiotics for a sinus infection.  Pt. Reports that the PCP told her with the last course of meds that she should be fine for surgery the 1 st. Week in Feb., taking Pred pak, inhaler & Zpak now. Pt. Reports that approx. 3 yrs. Ago, she was reviewed by Cardiac, C. Valentine & found after the stress test that she was + for panic attacks but proved heart healthy. CXR ordered today, still having some dry cough; pt. Remarks that she feels some tightness in her chest when she coughs & she feels this might be a problem for surgery.

## 2016-04-07 NOTE — Progress Notes (Addendum)
Noted records under media tab for cardiac eval.  Pt. Aware of need to hold Plavix & has already had her last dose prior to surgery. Taking Plavix due to history of TIA in the past.

## 2016-04-08 NOTE — Progress Notes (Addendum)
Anesthesia Chart Review:  Pt is a 71 year old female scheduled for C3-4, C4-5, C5-6, C6-7 ACDF on 04/15/2016 with Ashok Pall, MD  - PCP is Sherrilee Gilles, DO in [place].   PMH includes:  HTN, TIA, GERD. Former smoker. BMI 36.5. S/p L TKA 08/14/14.   Pt reported at PAT she had been treated with antibiotics x3 since early December for sinus infection. Currently taking zithromax, prednisone and albuterol now for illness. Pt reports dry cough and chest tightness with coughing. I left message for [facility] in Dr. Lacy Duverney office with above information.   Medications include: Albuterol, amlodipine, Zithromax, carvedilol, Plavix, Dexilant, HCTZ, lisinopril, potassium, prednisone. Last dose Plavix 04/06/16.  Preoperative labs reviewed.    CXR 04/07/16: No active cardiopulmonary disease.  EKG 03/27/16 (Dr. Rosemary Holms office): Sinus bradycardia (51 bpm) with sinus arrhythmia. Possible anteroseptal infarct, age undetermined. Lateral T-wave changes are nonspecific. No significant change when compared to EKG 06/05/14 (found in correspondence 08/17/14 in media tab).  Stress echo 10/02/13 (correspondence 08/17/14 in media tab):  - Negative for ischemia. Achieved 9.6 METS. LV size normal. LV global systolic function normal. EF 65%. No LV regional wall motion abnormalities.  If pt illness has resolved, I anticipate pt can proceed as scheduled.   Willeen Cass, FNP-BC [facility] Short Stay Surgical Center/Anesthesiology Phone: [phone] 04/08/2016 2:32 PM

## 2016-05-08 ENCOUNTER — Other Ambulatory Visit: Payer: Self-pay | Admitting: Neurosurgery

## 2016-05-08 ENCOUNTER — Inpatient Hospital Stay (HOSPITAL_COMMUNITY)
Admission: RE | Admit: 2016-05-08 | Discharge: 2016-05-08 | Disposition: A | Discharge status: Home / Self Care | Payer: Medicare Other | Source: Ambulatory Visit

## 2016-05-08 ENCOUNTER — Encounter (HOSPITAL_COMMUNITY): Payer: Self-pay

## 2016-05-08 ENCOUNTER — Other Ambulatory Visit (HOSPITAL_COMMUNITY): Payer: Self-pay | Admitting: *Deleted

## 2016-05-08 NOTE — Pre-Procedure Instructions (Signed)
Breanna [name]  05/08/2016    Your procedure is scheduled on Friday, May 15, 2016 at 9:00 AM.   Report to [facility] Entrance "A" Admitting Office at 7:00 AM.   Call this number if you have problems the morning of surgery: [phone]   Questions prior to day of surgery, please call [phone] between 8 & 4 PM.   Remember:  Do not eat food or drink liquids after midnight Thursday, 05/14/16.  Take these medicines the morning of surgery with A SIP OF WATER: Amlodipine (Norvasc), Carvedilol (Coreg), Dexlansoprazole (Dexilant), Estradiol (Estrace), allergy med, Flonase, Tramadol or Tylenol - if needed, Albuterol inhaler - if needed (bring inhaler with you day of surgery)  Stop Aspirin and Plavix as instructed by your physician. Do not use NSAIDS (Ibuprofen, Aleve, etc) 5 days prior to surgery.   Do not wear jewelry, make-up or nail polish.  Do not wear lotions, powders, perfumes or deodorant.  Do not shave 48 hours prior to surgery.    Do not bring valuables to the hospital.  [facility] is not responsible for any belongings or valuables.  Contacts, dentures or bridgework may not be worn into surgery.  Leave your suitcase in the car.  After surgery it may be brought to your room.  For patients admitted to the hospital, discharge time will be determined by your treatment team.  Special instructions:  [facility] - Preparing for Surgery  Before surgery, you can play an important role.  Because skin is not sterile, your skin needs to be as free of germs as possible.  You can reduce the number of germs on you skin by washing with CHG (chlorahexidine gluconate) soap before surgery.  CHG is an antiseptic cleaner which kills germs and bonds with the skin to continue killing germs even after washing.  Please DO NOT use if you have an allergy to CHG or antibacterial soaps.  If your skin becomes reddened/irritated stop using the CHG and inform your nurse when you arrive at Short  Stay.  Do not shave (including legs and underarms) for at least 48 hours prior to the first CHG shower.  You may shave your face.  Please follow these instructions carefully:   1.  Shower with CHG Soap the night before surgery and the                    morning of Surgery.  2.  If you choose to wash your hair, wash your hair first as usual with your       normal shampoo.  3.  After you shampoo, rinse your hair and body thoroughly to remove the shampoo.  4.  Use CHG as you would any other liquid soap.  You can apply chg directly       to the skin and wash gently with scrungie or a clean washcloth.  5.  Apply the CHG Soap to your body ONLY FROM THE NECK DOWN.        Do not use on open wounds or open sores.  Avoid contact with your eyes, ears, mouth and genitals (private parts).  Wash genitals (private parts) with your normal soap.  6.  Wash thoroughly, paying special attention to the area where your surgery        will be performed.  7.  Thoroughly rinse your body with warm water from the neck down.  8.  DO NOT shower/wash with your normal soap after using and rinsing off  the CHG Soap.  9.  Pat yourself dry with a clean towel.            10.  Wear clean pajamas.            11.  Place clean sheets on your bed the night of your first shower and do not        sleep with pets.  Day of Surgery  Do not apply any lotions/deodorants the morning of surgery.  Please wear clean clothes to the hospital.   Please read over the fact sheets that you were given.

## 2016-05-08 NOTE — Progress Notes (Signed)
Pt did not arrive for PAT appt. Called her and she had it down for Monday, the 5th. I told her that our scheduler would call and try to make another appt with her.

## 2016-05-13 ENCOUNTER — Encounter (HOSPITAL_COMMUNITY)
Admission: RE | Admit: 2016-05-13 | Discharge: 2016-05-13 | Disposition: A | Discharge status: Home / Self Care | Payer: Medicare Other | Source: Ambulatory Visit | Attending: Neurosurgery | Admitting: Neurosurgery

## 2016-05-13 ENCOUNTER — Encounter (HOSPITAL_COMMUNITY): Payer: Self-pay

## 2016-05-13 LAB — CBC
HEMATOCRIT: 41.4 % (ref 36.0–46.0)
Hemoglobin: 14 g/dL (ref 12.0–15.0)
MCH: 32.2 pg (ref 26.0–34.0)
MCHC: 33.8 g/dL (ref 30.0–36.0)
MCV: 95.2 fL (ref 78.0–100.0)
Platelets: 206 10*3/uL (ref 150–400)
RBC: 4.35 MIL/uL (ref 3.87–5.11)
RDW: 13 % (ref 11.5–15.5)
WBC: 5.8 10*3/uL (ref 4.0–10.5)

## 2016-05-13 LAB — SURGICAL PCR SCREEN
MRSA, PCR: NEGATIVE
STAPHYLOCOCCUS AUREUS: NEGATIVE

## 2016-05-13 LAB — BASIC METABOLIC PANEL
Anion gap: 7 (ref 5–15)
BUN: 12 mg/dL (ref 6–20)
CO2: 29 mmol/L (ref 22–32)
Calcium: 9.2 mg/dL (ref 8.9–10.3)
Chloride: 103 mmol/L (ref 101–111)
Creatinine, Ser: 0.75 mg/dL (ref 0.44–1.00)
GFR calc Af Amer: >60 mL/min (ref >60)
GLUCOSE: 77 mg/dL (ref 65–99)
POTASSIUM: 3.9 mmol/L (ref 3.5–5.1)
Sodium: 139 mmol/L (ref 135–145)

## 2016-05-13 NOTE — Progress Notes (Signed)
Dr. Lacy Duverney office called for consent orders.

## 2016-05-14 MED ORDER — CEFAZOLIN SODIUM-DEXTROSE 2-4 GM/100ML-% IV SOLN
2.0000 g | INTRAVENOUS | Status: AC
  Administered 2016-05-15 (×2): 2 g via INTRAVENOUS

## 2016-05-14 MED ORDER — CEFAZOLIN SODIUM-DEXTROSE 2-4 GM/100ML-% IV SOLN: 2.0000 g | INTRAVENOUS | Status: DC

## 2016-05-14 NOTE — Anesthesia Preprocedure Evaluation (Addendum)
Anesthesia Evaluation  Patient identified by MRN, date of birth, ID band Patient awake    Reviewed: Allergy & Precautions, NPO status , Patient's Chart, lab work & pertinent test results  Airway Mallampati: II  TM Distance: >3 FB Neck ROM: Limited    Dental  (+) Teeth Intact, Dental Advisory Given   Pulmonary former smoker,    Pulmonary exam normal        Cardiovascular hypertension, Pt. on medications and Pt. on home beta blockers Normal cardiovascular exam     Neuro/Psych Anxiety CVA    GI/Hepatic Neg liver ROS, GERD  ,  Endo/Other  negative endocrine ROS  Renal/GU negative Renal ROS     Musculoskeletal  (+) Arthritis ,   Abdominal   Peds  Hematology negative hematology ROS (+)   Anesthesia Other Findings   Reproductive/Obstetrics                           Lab Results  Component Value Date   WBC 5.8 05/13/2016   HGB 14.0 05/13/2016   HCT 41.4 05/13/2016   MCV 95.2 05/13/2016   PLT 206 05/13/2016   Lab Results  Component Value Date   CREATININE 0.75 05/13/2016   BUN 12 05/13/2016   NA 139 05/13/2016   K 3.9 05/13/2016   CL 103 05/13/2016   CO2 29 05/13/2016    Anesthesia Physical Anesthesia Plan  ASA: III  Anesthesia Plan: General   Post-op Pain Management:    Induction: Intravenous  Airway Management Planned: Oral ETT  Additional Equipment:   Intra-op Plan:   Post-operative Plan: Extubation in OR  Informed Consent: I have reviewed the patients History and Physical, chart, labs and discussed the procedure including the risks, benefits and alternatives for the proposed anesthesia with the patient or authorized representative who has indicated his/her understanding and acceptance.   Dental advisory given  Plan Discussed with:   Anesthesia Plan Comments:        Anesthesia Quick Evaluation

## 2016-05-15 ENCOUNTER — Inpatient Hospital Stay (HOSPITAL_COMMUNITY): Payer: Medicare Other | Admitting: Anesthesiology

## 2016-05-15 ENCOUNTER — Inpatient Hospital Stay (HOSPITAL_COMMUNITY): Payer: Medicare Other | Admitting: Vascular Surgery

## 2016-05-15 ENCOUNTER — Encounter (HOSPITAL_COMMUNITY): Payer: Self-pay | Admitting: Anesthesiology

## 2016-05-15 ENCOUNTER — Inpatient Hospital Stay (HOSPITAL_COMMUNITY): Payer: Medicare Other

## 2016-05-15 ENCOUNTER — Encounter (HOSPITAL_COMMUNITY)
Admission: RE | Disposition: A | Discharge status: Home / Self Care | Payer: Self-pay | Source: Ambulatory Visit | Attending: Neurosurgery

## 2016-05-15 ENCOUNTER — Inpatient Hospital Stay (HOSPITAL_COMMUNITY)
Admission: RE | Admit: 2016-05-15 | Discharge: 2016-05-18 | DRG: 472 | Disposition: A | Discharge status: Home / Self Care | Payer: Medicare Other | Source: Ambulatory Visit | Attending: Neurosurgery | Admitting: Neurosurgery

## 2016-05-15 DIAGNOSIS — Z79899 Other long term (current) drug therapy: Secondary | ICD-10-CM

## 2016-05-15 DIAGNOSIS — Z87891 Personal history of nicotine dependence: Secondary | ICD-10-CM | POA: Diagnosis not present

## 2016-05-15 DIAGNOSIS — Z888 Allergy status to other drugs, medicaments and biological substances status: Secondary | ICD-10-CM | POA: Diagnosis not present

## 2016-05-15 DIAGNOSIS — Z96652 Presence of left artificial knee joint: Secondary | ICD-10-CM | POA: Diagnosis present

## 2016-05-15 DIAGNOSIS — Z8249 Family history of ischemic heart disease and other diseases of the circulatory system: Secondary | ICD-10-CM | POA: Diagnosis not present

## 2016-05-15 DIAGNOSIS — K219 Gastro-esophageal reflux disease without esophagitis: Secondary | ICD-10-CM | POA: Diagnosis present

## 2016-05-15 DIAGNOSIS — Z8673 Personal history of transient ischemic attack (TIA), and cerebral infarction without residual deficits: Secondary | ICD-10-CM

## 2016-05-15 DIAGNOSIS — Z90710 Acquired absence of both cervix and uterus: Secondary | ICD-10-CM

## 2016-05-15 DIAGNOSIS — Z7902 Long term (current) use of antithrombotics/antiplatelets: Secondary | ICD-10-CM | POA: Diagnosis not present

## 2016-05-15 DIAGNOSIS — F410 Panic disorder [episodic paroxysmal anxiety] without agoraphobia: Secondary | ICD-10-CM | POA: Diagnosis present

## 2016-05-15 DIAGNOSIS — Y838 Other surgical procedures as the cause of abnormal reaction of the patient, or of later complication, without mention of misadventure at the time of the procedure: Secondary | ICD-10-CM | POA: Diagnosis not present

## 2016-05-15 DIAGNOSIS — S142XXA Injury of nerve root of cervical spine, initial encounter: Secondary | ICD-10-CM | POA: Diagnosis not present

## 2016-05-15 DIAGNOSIS — M2578 Osteophyte, vertebrae: Secondary | ICD-10-CM | POA: Diagnosis present

## 2016-05-15 DIAGNOSIS — M4722 Other spondylosis with radiculopathy, cervical region: Principal | ICD-10-CM | POA: Diagnosis present

## 2016-05-15 DIAGNOSIS — Z981 Arthrodesis status: Secondary | ICD-10-CM

## 2016-05-15 DIAGNOSIS — G9782 Other postprocedural complications and disorders of nervous system: Secondary | ICD-10-CM | POA: Diagnosis not present

## 2016-05-15 DIAGNOSIS — I10 Essential (primary) hypertension: Secondary | ICD-10-CM | POA: Diagnosis present

## 2016-05-15 DIAGNOSIS — Z419 Encounter for procedure for purposes other than remedying health state, unspecified: Secondary | ICD-10-CM

## 2016-05-15 HISTORY — PX: ANTERIOR CERVICAL DECOMPRESSION/DISCECTOMY FUSION 4 LEVELS: SHX5556

## 2016-05-15 SURGERY — ANTERIOR CERVICAL DECOMPRESSION/DISCECTOMY FUSION 4 LEVELS
Anesthesia: General | Site: Spine Cervical

## 2016-05-15 MED ORDER — LIDOCAINE 2% (20 MG/ML) 5 ML SYRINGE
INTRAMUSCULAR | Status: AC
  Filled 2016-05-15: qty 10

## 2016-05-15 MED ORDER — CARVEDILOL 25 MG PO TABS
25.0000 mg | ORAL_TABLET | Freq: Two times a day (BID) | ORAL | Status: DC
  Administered 2016-05-15 – 2016-05-18 (×7): 25 mg via ORAL
  Filled 2016-05-15: qty 2
  Filled 2016-05-15 (×4): qty 1
  Filled 2016-05-15 (×3): qty 2
  Filled 2016-05-15 (×4): qty 1

## 2016-05-15 MED ORDER — ACETAMINOPHEN 500 MG PO TABS
1000.0000 mg | ORAL_TABLET | Freq: Four times a day (QID) | ORAL | Status: AC
  Administered 2016-05-15 – 2016-05-16 (×4): 1000 mg via ORAL
  Filled 2016-05-15 (×4): qty 2

## 2016-05-15 MED ORDER — ROCURONIUM BROMIDE 50 MG/5ML IV SOSY
PREFILLED_SYRINGE | INTRAVENOUS | Status: AC
  Filled 2016-05-15: qty 5

## 2016-05-15 MED ORDER — LIDOCAINE-EPINEPHRINE (PF) 2 %-1:200000 IJ SOLN
INTRAMUSCULAR | Status: DC | PRN
  Administered 2016-05-15: 5 mL

## 2016-05-15 MED ORDER — SODIUM CHLORIDE 0.9 % IJ SOLN
INTRAMUSCULAR | Status: AC
  Filled 2016-05-15: qty 20

## 2016-05-15 MED ORDER — DEXAMETHASONE SODIUM PHOSPHATE 4 MG/ML IJ SOLN
4.0000 mg | Freq: Four times a day (QID) | INTRAMUSCULAR | Status: AC
  Administered 2016-05-15 – 2016-05-16 (×3): 4 mg via INTRAVENOUS
  Filled 2016-05-15 (×3): qty 1

## 2016-05-15 MED ORDER — CHLORHEXIDINE GLUCONATE CLOTH 2 % EX PADS: 6.0000 | MEDICATED_PAD | Freq: Once | CUTANEOUS | Status: DC

## 2016-05-15 MED ORDER — SENNOSIDES-DOCUSATE SODIUM 8.6-50 MG PO TABS: 1.0000 | ORAL_TABLET | Freq: Every evening | ORAL | Status: DC | PRN

## 2016-05-15 MED ORDER — MIDAZOLAM HCL 2 MG/2ML IJ SOLN
INTRAMUSCULAR | Status: AC
  Filled 2016-05-15: qty 2

## 2016-05-15 MED ORDER — ZOLPIDEM TARTRATE 5 MG PO TABS: 5.0000 mg | ORAL_TABLET | Freq: Every evening | ORAL | Status: DC | PRN

## 2016-05-15 MED ORDER — MAGNESIUM CITRATE PO SOLN: 1.0000 | Freq: Once | ORAL | Status: DC | PRN

## 2016-05-15 MED ORDER — SUGAMMADEX SODIUM 200 MG/2ML IV SOLN
INTRAVENOUS | Status: AC
  Filled 2016-05-15: qty 2

## 2016-05-15 MED ORDER — FLUTICASONE PROPIONATE 50 MCG/ACT NA SUSP
1.0000 | Freq: Every day | NASAL | Status: DC
  Administered 2016-05-15 – 2016-05-18 (×4): 1 via NASAL
  Filled 2016-05-15 (×2): qty 16

## 2016-05-15 MED ORDER — ARTIFICIAL TEARS OP OINT
TOPICAL_OINTMENT | OPHTHALMIC | Status: DC | PRN
  Administered 2016-05-15: 1 via OPHTHALMIC

## 2016-05-15 MED ORDER — HYDROCHLOROTHIAZIDE 25 MG PO TABS
25.0000 mg | ORAL_TABLET | Freq: Every day | ORAL | Status: DC
  Administered 2016-05-16 – 2016-05-18 (×3): 25 mg via ORAL
  Filled 2016-05-15 (×3): qty 1

## 2016-05-15 MED ORDER — LACTATED RINGERS IV SOLN
INTRAVENOUS | Status: DC | PRN
  Administered 2016-05-15: 07:00:00 via INTRAVENOUS
  Administered 2016-05-15: 1000 mL
  Administered 2016-05-15 (×2): via INTRAVENOUS

## 2016-05-15 MED ORDER — BISACODYL 5 MG PO TBEC: 5.0000 mg | DELAYED_RELEASE_TABLET | Freq: Every day | ORAL | Status: DC | PRN

## 2016-05-15 MED ORDER — SODIUM CHLORIDE 0.9% FLUSH: 3.0000 mL | INTRAVENOUS | Status: DC | PRN

## 2016-05-15 MED ORDER — ONDANSETRON HCL 4 MG PO TABS: 4.0000 mg | ORAL_TABLET | Freq: Four times a day (QID) | ORAL | Status: DC | PRN

## 2016-05-15 MED ORDER — ARTIFICIAL TEARS OP OINT
TOPICAL_OINTMENT | OPHTHALMIC | Status: AC
  Filled 2016-05-15: qty 3.5

## 2016-05-15 MED ORDER — AZITHROMYCIN 250 MG PO TABS: 250.0000 mg | ORAL_TABLET | Freq: Every day | ORAL | Status: DC

## 2016-05-15 MED ORDER — PROPOFOL 10 MG/ML IV BOLUS
INTRAVENOUS | Status: DC | PRN
  Administered 2016-05-15: 150 mg via INTRAVENOUS

## 2016-05-15 MED ORDER — MENTHOL 3 MG MT LOZG: 1.0000 | LOZENGE | OROMUCOSAL | Status: DC | PRN

## 2016-05-15 MED ORDER — CEFAZOLIN SODIUM 1 G IJ SOLR
INTRAMUSCULAR | Status: AC
  Filled 2016-05-15: qty 20

## 2016-05-15 MED ORDER — LEVOCETIRIZINE DIHYDROCHLORIDE 5 MG PO TABS: 5.0000 mg | ORAL_TABLET | Freq: Every day | ORAL | Status: DC

## 2016-05-15 MED ORDER — PROMETHAZINE HCL 25 MG/ML IJ SOLN
6.2500 mg | INTRAMUSCULAR | Status: DC | PRN
  Administered 2016-05-15: 6.25 mg via INTRAVENOUS

## 2016-05-15 MED ORDER — DEXAMETHASONE 4 MG PO TABS: 4.0000 mg | ORAL_TABLET | Freq: Four times a day (QID) | ORAL | Status: AC

## 2016-05-15 MED ORDER — PROMETHAZINE HCL 25 MG/ML IJ SOLN
INTRAMUSCULAR | Status: AC
  Administered 2016-05-15: 6.25 mg via INTRAVENOUS
  Filled 2016-05-15: qty 1

## 2016-05-15 MED ORDER — SODIUM CHLORIDE 0.9% FLUSH
3.0000 mL | Freq: Two times a day (BID) | INTRAVENOUS | Status: DC
  Administered 2016-05-15 – 2016-05-18 (×6): 3 mL via INTRAVENOUS

## 2016-05-15 MED ORDER — EPHEDRINE 5 MG/ML INJ
INTRAVENOUS | Status: AC
  Filled 2016-05-15: qty 10

## 2016-05-15 MED ORDER — MIDAZOLAM HCL 5 MG/5ML IJ SOLN
INTRAMUSCULAR | Status: DC | PRN
  Administered 2016-05-15 (×2): 1 mg via INTRAVENOUS

## 2016-05-15 MED ORDER — AMLODIPINE BESYLATE 5 MG PO TABS
5.0000 mg | ORAL_TABLET | Freq: Every day | ORAL | Status: DC
  Administered 2016-05-15 – 2016-05-18 (×4): 5 mg via ORAL
  Filled 2016-05-15 (×4): qty 1

## 2016-05-15 MED ORDER — 0.9 % SODIUM CHLORIDE (POUR BTL) OPTIME
TOPICAL | Status: DC | PRN
  Administered 2016-05-15: 1000 mL

## 2016-05-15 MED ORDER — DEXAMETHASONE SODIUM PHOSPHATE 10 MG/ML IJ SOLN
INTRAMUSCULAR | Status: AC
  Filled 2016-05-15: qty 1

## 2016-05-15 MED ORDER — HYDROMORPHONE HCL 1 MG/ML IJ SOLN
0.2500 mg | INTRAMUSCULAR | Status: DC | PRN
  Administered 2016-05-15 (×4): 0.5 mg via INTRAVENOUS

## 2016-05-15 MED ORDER — LISINOPRIL 40 MG PO TABS
40.0000 mg | ORAL_TABLET | Freq: Every day | ORAL | Status: DC
  Administered 2016-05-16 – 2016-05-18 (×3): 40 mg via ORAL
  Filled 2016-05-15 (×3): qty 1
  Filled 2016-05-15 (×2): qty 2

## 2016-05-15 MED ORDER — ONDANSETRON HCL 4 MG/2ML IJ SOLN
INTRAMUSCULAR | Status: AC
  Filled 2016-05-15: qty 2

## 2016-05-15 MED ORDER — ESTRADIOL 1 MG PO TABS
1.0000 mg | ORAL_TABLET | Freq: Every day | ORAL | Status: DC
  Administered 2016-05-16 – 2016-05-18 (×3): 1 mg via ORAL
  Filled 2016-05-15 (×5): qty 1

## 2016-05-15 MED ORDER — LORATADINE 10 MG PO TABS
10.0000 mg | ORAL_TABLET | Freq: Every day | ORAL | Status: DC
  Administered 2016-05-16 – 2016-05-18 (×3): 10 mg via ORAL
  Filled 2016-05-15 (×3): qty 1

## 2016-05-15 MED ORDER — PANTOPRAZOLE SODIUM 40 MG PO TBEC
40.0000 mg | DELAYED_RELEASE_TABLET | Freq: Every day | ORAL | Status: DC
  Administered 2016-05-16 – 2016-05-18 (×3): 40 mg via ORAL
  Filled 2016-05-15 (×3): qty 1

## 2016-05-15 MED ORDER — PROPOFOL 10 MG/ML IV BOLUS
INTRAVENOUS | Status: AC
  Filled 2016-05-15: qty 20

## 2016-05-15 MED ORDER — THROMBIN 20000 UNITS EX SOLR
CUTANEOUS | Status: DC | PRN
  Administered 2016-05-15: 20 mL via TOPICAL

## 2016-05-15 MED ORDER — ROCURONIUM BROMIDE 100 MG/10ML IV SOLN
INTRAVENOUS | Status: DC | PRN
  Administered 2016-05-15 (×2): 10 mg via INTRAVENOUS
  Administered 2016-05-15: 50 mg via INTRAVENOUS
  Administered 2016-05-15 (×2): 10 mg via INTRAVENOUS
  Administered 2016-05-15: 20 mg via INTRAVENOUS

## 2016-05-15 MED ORDER — LIDOCAINE HCL 4 % EX SOLN
CUTANEOUS | Status: DC | PRN
  Administered 2016-05-15: 2 mL via TOPICAL

## 2016-05-15 MED ORDER — PHENYLEPHRINE HCL 10 MG/ML IJ SOLN
INTRAVENOUS | Status: DC | PRN
  Administered 2016-05-15: 25 ug/min via INTRAVENOUS

## 2016-05-15 MED ORDER — PHENOL 1.4 % MT LIQD: 1.0000 | OROMUCOSAL | Status: DC | PRN

## 2016-05-15 MED ORDER — OXYCODONE HCL 5 MG PO TABS
ORAL_TABLET | ORAL | Status: AC
  Administered 2016-05-15: 10 mg via ORAL
  Filled 2016-05-15: qty 2

## 2016-05-15 MED ORDER — HYDROMORPHONE HCL 1 MG/ML IJ SOLN
INTRAMUSCULAR | Status: AC
  Administered 2016-05-15: 0.5 mg via INTRAVENOUS
  Filled 2016-05-15: qty 1

## 2016-05-15 MED ORDER — SODIUM CHLORIDE 0.9 % IV SOLN: 250.0000 mL | INTRAVENOUS | Status: DC

## 2016-05-15 MED ORDER — FENTANYL CITRATE (PF) 100 MCG/2ML IJ SOLN
INTRAMUSCULAR | Status: AC
  Filled 2016-05-15: qty 2

## 2016-05-15 MED ORDER — THROMBIN 20000 UNITS EX SOLR
CUTANEOUS | Status: AC
  Filled 2016-05-15: qty 20000

## 2016-05-15 MED ORDER — EPHEDRINE SULFATE 50 MG/ML IJ SOLN
INTRAMUSCULAR | Status: DC | PRN
  Administered 2016-05-15 (×4): 5 mg via INTRAVENOUS

## 2016-05-15 MED ORDER — PHENYLEPHRINE 40 MCG/ML (10ML) SYRINGE FOR IV PUSH (FOR BLOOD PRESSURE SUPPORT)
PREFILLED_SYRINGE | INTRAVENOUS | Status: AC
  Filled 2016-05-15: qty 10

## 2016-05-15 MED ORDER — POTASSIUM CHLORIDE CRYS ER 10 MEQ PO TBCR
10.0000 meq | EXTENDED_RELEASE_TABLET | Freq: Every day | ORAL | Status: DC
  Administered 2016-05-15 – 2016-05-18 (×4): 10 meq via ORAL
  Filled 2016-05-15 (×4): qty 1

## 2016-05-15 MED ORDER — ACETAMINOPHEN 650 MG RE SUPP: 650.0000 mg | RECTAL | Status: DC | PRN

## 2016-05-15 MED ORDER — PHENYLEPHRINE HCL 10 MG/ML IJ SOLN
INTRAMUSCULAR | Status: AC
  Filled 2016-05-15: qty 1

## 2016-05-15 MED ORDER — PHENYLEPHRINE HCL 10 MG/ML IJ SOLN
INTRAMUSCULAR | Status: DC | PRN
  Administered 2016-05-15: 80 ug via INTRAVENOUS
  Administered 2016-05-15: 40 ug via INTRAVENOUS

## 2016-05-15 MED ORDER — ROCURONIUM BROMIDE 50 MG/5ML IV SOSY
PREFILLED_SYRINGE | INTRAVENOUS | Status: AC
  Filled 2016-05-15: qty 10

## 2016-05-15 MED ORDER — OXYCODONE HCL 5 MG PO TABS
5.0000 mg | ORAL_TABLET | ORAL | Status: DC | PRN
  Administered 2016-05-15 – 2016-05-16 (×4): 10 mg via ORAL
  Administered 2016-05-17: 5 mg via ORAL
  Administered 2016-05-17 – 2016-05-18 (×3): 10 mg via ORAL
  Administered 2016-05-18: 5 mg via ORAL
  Filled 2016-05-15: qty 1
  Filled 2016-05-15 (×5): qty 2
  Filled 2016-05-15: qty 1
  Filled 2016-05-15: qty 2

## 2016-05-15 MED ORDER — DEXAMETHASONE SODIUM PHOSPHATE 10 MG/ML IJ SOLN
INTRAMUSCULAR | Status: DC | PRN
  Administered 2016-05-15: 10 mg via INTRAVENOUS

## 2016-05-15 MED ORDER — TRAMADOL HCL 50 MG PO TABS
50.0000 mg | ORAL_TABLET | Freq: Four times a day (QID) | ORAL | Status: DC | PRN
Start: 2016-05-15 — End: 2016-05-18

## 2016-05-15 MED ORDER — LIDOCAINE HCL (CARDIAC) 20 MG/ML IV SOLN
INTRAVENOUS | Status: DC | PRN
  Administered 2016-05-15: 60 mg via INTRAVENOUS

## 2016-05-15 MED ORDER — PAROXETINE HCL 10 MG PO TABS
10.0000 mg | ORAL_TABLET | Freq: Every evening | ORAL | Status: DC
  Administered 2016-05-15 – 2016-05-18 (×4): 10 mg via ORAL
  Filled 2016-05-15 (×6): qty 1

## 2016-05-15 MED ORDER — POTASSIUM CHLORIDE IN NACL 20-0.9 MEQ/L-% IV SOLN
INTRAVENOUS | Status: DC
  Administered 2016-05-15: 21:00:00 via INTRAVENOUS
  Filled 2016-05-15 (×2): qty 1000

## 2016-05-15 MED ORDER — FENTANYL CITRATE (PF) 100 MCG/2ML IJ SOLN
INTRAMUSCULAR | Status: DC | PRN
  Administered 2016-05-15 (×5): 50 ug via INTRAVENOUS

## 2016-05-15 MED ORDER — SODIUM CHLORIDE 0.9 % IV SOLN
0.0125 ug/kg/min | INTRAVENOUS | Status: AC
  Administered 2016-05-15: 0.15 ug/kg/min via INTRAVENOUS
  Filled 2016-05-15: qty 2000

## 2016-05-15 MED ORDER — ALBUTEROL SULFATE (2.5 MG/3ML) 0.083% IN NEBU: 2.5000 mg | INHALATION_SOLUTION | RESPIRATORY_TRACT | Status: DC | PRN

## 2016-05-15 MED ORDER — SENNA 8.6 MG PO TABS
1.0000 | ORAL_TABLET | Freq: Two times a day (BID) | ORAL | Status: DC
  Administered 2016-05-15 – 2016-05-18 (×5): 8.6 mg via ORAL
  Filled 2016-05-15 (×5): qty 1

## 2016-05-15 MED ORDER — ACETAMINOPHEN 325 MG PO TABS: 650.0000 mg | ORAL_TABLET | ORAL | Status: DC | PRN

## 2016-05-15 MED ORDER — SUGAMMADEX SODIUM 200 MG/2ML IV SOLN
INTRAVENOUS | Status: DC | PRN
  Administered 2016-05-15: 150 mg via INTRAVENOUS

## 2016-05-15 MED ORDER — LIDOCAINE-EPINEPHRINE (PF) 2 %-1:200000 IJ SOLN
INTRAMUSCULAR | Status: AC
  Filled 2016-05-15: qty 20

## 2016-05-15 MED ORDER — ONDANSETRON HCL 4 MG/2ML IJ SOLN: 4.0000 mg | Freq: Four times a day (QID) | INTRAMUSCULAR | Status: DC | PRN

## 2016-05-15 SURGICAL SUPPLY — 76 items
BLADE CLIPPER SURG (BLADE) IMPLANT
BNDG GAUZE ELAST 4 BULKY (GAUZE/BANDAGES/DRESSINGS) ×6 IMPLANT
BUR DRUM 4.0 (BURR) ×2 IMPLANT
BUR DRUM 4.0MM (BURR) ×1
BUR MATCHSTICK NEURO 3.0 LAGG (BURR) ×3 IMPLANT
CANISTER SUCT 3000ML PPV (MISCELLANEOUS) ×3 IMPLANT
CARTRIDGE OIL MAESTRO DRILL (MISCELLANEOUS) ×1 IMPLANT
DECANTER SPIKE VIAL GLASS SM (MISCELLANEOUS) ×3 IMPLANT
DERMABOND ADVANCED (GAUZE/BANDAGES/DRESSINGS) ×2
DERMABOND ADVANCED .7 DNX12 (GAUZE/BANDAGES/DRESSINGS) ×1 IMPLANT
DIFFUSER DRILL AIR PNEUMATIC (MISCELLANEOUS) ×3 IMPLANT
DRAPE HALF SHEET 40X57 (DRAPES) ×3 IMPLANT
DRAPE LAPAROTOMY 100X72 PEDS (DRAPES) ×3 IMPLANT
DRAPE MICROSCOPE LEICA (MISCELLANEOUS) ×3 IMPLANT
DRAPE POUCH INSTRU U-SHP 10X18 (DRAPES) ×3 IMPLANT
DURAPREP 6ML APPLICATOR 50/CS (WOUND CARE) ×3 IMPLANT
ELECT COATED BLADE 2.86 ST (ELECTRODE) ×3 IMPLANT
ELECT REM PT RETURN 9FT ADLT (ELECTROSURGICAL) ×3
ELECTRODE REM PT RTRN 9FT ADLT (ELECTROSURGICAL) ×1 IMPLANT
GAUZE SPONGE 4X4 16PLY XRAY LF (GAUZE/BANDAGES/DRESSINGS) ×3 IMPLANT
GLOVE BIO SURGEON STRL SZ 6.5 (GLOVE) IMPLANT
GLOVE BIO SURGEON STRL SZ7 (GLOVE) IMPLANT
GLOVE BIO SURGEON STRL SZ7.5 (GLOVE) IMPLANT
GLOVE BIO SURGEON STRL SZ8 (GLOVE) ×3 IMPLANT
GLOVE BIO SURGEON STRL SZ8.5 (GLOVE) IMPLANT
GLOVE BIO SURGEONS STRL SZ 6.5 (GLOVE)
GLOVE BIOGEL M 8.0 STRL (GLOVE) IMPLANT
GLOVE BIOGEL PI IND STRL 7.0 (GLOVE) ×2 IMPLANT
GLOVE BIOGEL PI IND STRL 7.5 (GLOVE) ×1 IMPLANT
GLOVE BIOGEL PI INDICATOR 7.0 (GLOVE) ×4
GLOVE BIOGEL PI INDICATOR 7.5 (GLOVE) ×2
GLOVE ECLIPSE 6.5 STRL STRAW (GLOVE) ×3 IMPLANT
GLOVE ECLIPSE 7.0 STRL STRAW (GLOVE) IMPLANT
GLOVE ECLIPSE 7.5 STRL STRAW (GLOVE) ×3 IMPLANT
GLOVE ECLIPSE 8.0 STRL XLNG CF (GLOVE) IMPLANT
GLOVE ECLIPSE 8.5 STRL (GLOVE) IMPLANT
GLOVE EXAM NITRILE LRG STRL (GLOVE) IMPLANT
GLOVE EXAM NITRILE XL STR (GLOVE) IMPLANT
GLOVE EXAM NITRILE XS STR PU (GLOVE) IMPLANT
GLOVE INDICATOR 6.5 STRL GRN (GLOVE) IMPLANT
GLOVE INDICATOR 7.0 STRL GRN (GLOVE) IMPLANT
GLOVE INDICATOR 7.5 STRL GRN (GLOVE) IMPLANT
GLOVE INDICATOR 8.0 STRL GRN (GLOVE) IMPLANT
GLOVE INDICATOR 8.5 STRL (GLOVE) IMPLANT
GLOVE OPTIFIT SS 8.0 STRL (GLOVE) IMPLANT
GLOVE SURG SS PI 6.5 STRL IVOR (GLOVE) IMPLANT
GLOVE SURG SS PI 7.0 STRL IVOR (GLOVE) ×15 IMPLANT
GOWN STRL REUS W/ TWL LRG LVL3 (GOWN DISPOSABLE) ×4 IMPLANT
GOWN STRL REUS W/ TWL XL LVL3 (GOWN DISPOSABLE) ×2 IMPLANT
GOWN STRL REUS W/TWL 2XL LVL3 (GOWN DISPOSABLE) IMPLANT
GOWN STRL REUS W/TWL LRG LVL3 (GOWN DISPOSABLE) ×8
GOWN STRL REUS W/TWL XL LVL3 (GOWN DISPOSABLE) ×4
HALTER HD/CHIN CERV TRACTION D (MISCELLANEOUS) IMPLANT
HEMOSTAT SURGICEL 2X14 (HEMOSTASIS) IMPLANT
KIT BASIN OR (CUSTOM PROCEDURE TRAY) ×3 IMPLANT
KIT ROOM TURNOVER OR (KITS) ×3 IMPLANT
NEEDLE HYPO 25X1 1.5 SAFETY (NEEDLE) ×3 IMPLANT
NEEDLE SPNL 22GX3.5 QUINCKE BK (NEEDLE) ×3 IMPLANT
NS IRRIG 1000ML POUR BTL (IV SOLUTION) ×3 IMPLANT
OIL CARTRIDGE MAESTRO DRILL (MISCELLANEOUS) ×3
PACK LAMINECTOMY NEURO (CUSTOM PROCEDURE TRAY) ×3 IMPLANT
PAD ARMBOARD 7.5X6 YLW CONV (MISCELLANEOUS) ×15 IMPLANT
PIN DISTRACTION 14MM (PIN) ×6 IMPLANT
PLATE HELIX TRANSLATION 70MM (Plate) ×3 IMPLANT
RUBBERBAND STERILE (MISCELLANEOUS) ×6 IMPLANT
SCREW SELF TAP 13MM VARIABLE (Screw) ×30 IMPLANT
SPACER ACF PARALLEL 7MM (Bone Implant) ×6 IMPLANT
SPACER PARALLEL 6MM CC ACF (Bone Implant) ×6 IMPLANT
SPONGE INTESTINAL PEANUT (DISPOSABLE) ×3 IMPLANT
SPONGE SURGIFOAM ABS GEL 100 (HEMOSTASIS) ×3 IMPLANT
SUT VIC AB 0 CT1 27 (SUTURE) ×2
SUT VIC AB 0 CT1 27XBRD ANTBC (SUTURE) ×1 IMPLANT
SUT VIC AB 3-0 SH 8-18 (SUTURE) ×9 IMPLANT
TOWEL GREEN STERILE (TOWEL DISPOSABLE) ×2 IMPLANT
TOWEL GREEN STERILE FF (TOWEL DISPOSABLE) ×3 IMPLANT
WATER STERILE IRR 1000ML POUR (IV SOLUTION) ×3 IMPLANT

## 2016-05-15 NOTE — Anesthesia Procedure Notes (Signed)
Procedure Name: Intubation Date/Time: 05/15/2016 9:37 AM Performed by: AUSTON, AMANDA M Pre-anesthesia Checklist: Patient identified, Emergency Drugs available, Suction available, Patient being monitored and Timeout performed Patient Re-evaluated:Patient Re-evaluated prior to inductionOxygen Delivery Method: Circle system utilized Preoxygenation: Pre-oxygenation with 100% oxygen Intubation Type: IV induction Ventilation: Mask ventilation without difficulty Laryngoscope Size: Glidescope and 3 Grade View: Grade II Tube type: Oral Tube size: 7.0 mm Number of attempts: 1 Airway Equipment and Method: Video-laryngoscopy,  Stylet and LTA kit utilized Placement Confirmation: ETT inserted through vocal cords under direct vision,  positive ETCO2 and breath sounds checked- equal and bilateral Secured at: 21 cm Tube secured with: Tape Dental Injury: Teeth and Oropharynx as per pre-operative assessment  Comments: Head/neck neutral for induction and intubation.       

## 2016-05-15 NOTE — Anesthesia Postprocedure Evaluation (Signed)
Anesthesia Post Note  Patient: Breanna [name]  Procedure(s) Performed: Procedure(s) (LRB): ANTERIOR CERVICAL DECOMPRESSION/DISCECTOMY FUSION CERVICAL THREE- CERVICAL FOUR, CERVICAL FOUR- CERVICAL FIVE, CERVICAL FIVE- CERVICAL SIX, CERVICAL SIX- CERVICAL SEVEN (N/A)  Patient location during evaluation: PACU Anesthesia Type: General Level of consciousness: awake and alert Pain management: pain level controlled Vital Signs Assessment: post-procedure vital signs reviewed and stable Respiratory status: spontaneous breathing, nonlabored ventilation, respiratory function stable and patient connected to nasal cannula oxygen Cardiovascular status: blood pressure returned to baseline and stable Postop Assessment: no signs of nausea or vomiting Anesthetic complications: no       Last Vitals:  Vitals:   05/15/16 1615 05/15/16 1630  BP: 135/85 133/83  Pulse: (!) 105 (!) 103  Resp: 17 11  Temp:      Last Pain:  Vitals:   05/15/16 1719  PainSc: 4     LLE Motor Response: Purposeful movement (05/15/16 1719) LLE Sensation: Full sensation;No numbness;No pain;No tingling (05/15/16 1719) RLE Motor Response: Purposeful movement (05/15/16 1719) RLE Sensation: Full sensation;No pain;No numbness;No tingling (05/15/16 1719)      Tiajuana Amass
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
## 2016-05-15 NOTE — Transfer of Care (Signed)
Immediate Anesthesia Transfer of Care Note  Patient: Breanna [name]  Procedure(s) Performed: Procedure(s) with comments: ANTERIOR CERVICAL DECOMPRESSION/DISCECTOMY FUSION CERVICAL THREE- CERVICAL FOUR, CERVICAL FOUR- CERVICAL FIVE, CERVICAL FIVE- CERVICAL SIX, CERVICAL SIX- CERVICAL SEVEN (N/A) - LEFT SIDE APPROACH  Patient Location: PACU  Anesthesia Type:General  Level of Consciousness: awake, alert , oriented and patient cooperative  Airway & Oxygen Therapy: Patient Spontanous Breathing and Patient connected to face mask oxygen  Post-op Assessment: Report given to RN and Post -op Vital signs reviewed and stable  Post vital signs: Reviewed and stable  Last Vitals:  Vitals:   05/15/16 0721  BP: (!) 151/52  Pulse: (!) 58  Resp: 20  Temp: 36.7 C    Last Pain:  Vitals:   05/15/16 0800  PainSc: 7       Patients Stated Pain Goal: 3 (38/33/38 3291)  Complications: No apparent anesthesia complications

## 2016-05-15 NOTE — Op Note (Signed)
05/15/2016  4:46 PM  PATIENT:  Breanna [name]  71 y.o. female with neck and right upper extremity pain due to severe foraminal narrowing at C3/4,4/5,5/6, and 6/7. 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 C 3/4,4/5,5/6,6/7 PROCEDURE:  Anterior Cervical decompression C3/4,4/5,5/6,6/7 Arthrodesis C3/4 6 mm 4/5 11mm, 5/6 61mm, and 6/7 2mm structural allografts Anterior instrumentation(Nuvasive translational plate) C3-7 SURGEON:   Surgeon(s): Ashok Pall, MD Eustace Moore, MD   ASSISTANTS: Sherley Bounds  ANESTHESIA:   general  EBL:  Total I/O In: 2100 [I.V.:2100] Out: 196 [Urine:285; Blood:150]  BLOOD ADMINISTERED:none  CELL SAVER GIVEN:none  COUNT:per nursing  DRAINS: none   SPECIMEN:  No Specimen  DICTATION: Breanna [name] was taken to the operating room, intubated, and placed under general anesthesia without difficulty. She was positioned supine with her head in slight extension on a horseshoe headrest. The neck was prepped and draped in a sterile manner. I infiltrated 5 cc's 1/2%lidocaine/1:200,000 strength epinephrine into the planned incision starting from the midline to the medial border of the left sternocleidomastoid muscle. I opened the incision with a 10 blade and dissected sharply through soft tissue to the platysma. I dissected in the plane superior to the platysma both rostrally and caudally. I then opened the platysma in a horizontal fashion with Metzenbaum scissors, and dissected in the inferior plane rostrally and caudally. With both blunt and sharp technique I created an avascular corridor to the cervical spine. I placed a spinal needle(s) in the disc space at C3/4 . I then reflected the longus colli  from C3 to C7 and placed self retaining retractors. I opened the disc space(s) at 3/4,4/5 and 5/6 with a 15 blade. I removed disc with curettes, Kerrison punches, and the drill. Using the drill I removed osteophytes and prepared for the decompression. I made another incision lower in the neck in order to easily see C6/7, in the same fashion starting from the midline and extending to the border of the sternocleidomastoid muscle on the left.  We decompressed the spinal canal and the C4,5,6,and 7 root(s) with the drill, Kerrison punches, and the curettes. I used the microscope to aid in microdissection. I removed the posterior longitudinal ligament to fully expose and decompress the thecal sac at each level. I exposed the roots laterally taking down the 3/4,4/5,5/6, and 6/7 uncovertebral joints. With the decompression complete I  moved on to the arthrodesis. I used the drill to level the surfaces of C3,4,5,6,and 7. I removed soft tissue to prepare the disc space and the bony surfaces. I measured the spaces and placed structural allografts into the disc spaces.  I then placed the anterior instrumentation. I placed 2 screws in each vertebral body through the plate. I locked the screws into place. Intraoperative xray showed the graft, plate, and screws to be in good position at the rostral end of the construct. The lower levels could not be seen.. I irrigated the wounds, achieved hemostasis, and closed the wounds in layers. I approximated the platysma, and the subcuticular plane with vicryl sutures at each incision. I used Dermabond for a sterile dressing for each incision   PLAN OF CARE: Admit to inpatient   PATIENT DISPOSITION:  PACU - hemodynamically stable.   Delay start of Pharmacological VTE agent (>24hrs) due to surgical blood loss or  risk of bleeding:  yes

## 2016-05-15 NOTE — Progress Notes (Signed)
Pt c/o of inability to raise her right arm, observed to be able to move the elbow and shrug the shoulder, moderate grip, no numbness, Dr Ronnald Ramp (on call) paged and notified, said might be due to irritation to her C5, pt on decadron, same explained to pt, said to continue to monitor, pt was however reassured, v/s stable, call light at bedside. Obasogie-Asidi, Adryanna Friedt Efe

## 2016-05-16 ENCOUNTER — Inpatient Hospital Stay (HOSPITAL_COMMUNITY): Payer: Medicare Other

## 2016-05-16 MED ORDER — DIPHENHYDRAMINE HCL 25 MG PO CAPS
25.0000 mg | ORAL_CAPSULE | Freq: Four times a day (QID) | ORAL | Status: DC | PRN
  Administered 2016-05-18: 25 mg via ORAL
  Filled 2016-05-16: qty 1

## 2016-05-16 NOTE — Progress Notes (Signed)
Vitals:   05/15/16 1630 05/15/16 2017 05/16/16 0131 05/16/16 0447  BP: 133/83 135/66 140/61 (!) 157/71  Pulse: (!) 103 (!) 101 93 80  Resp: 11 20 18 18   Temp:  98.2 F (36.8 C) 97.5 F (36.4 C) 97.5 F (36.4 C)  TempSrc:  Oral Oral Oral  SpO2: 95% 96% 92% 93%  Weight:      Height:        CBC  Recent Labs  05/13/16 1553  WBC 5.8  HGB 14.0  HCT 41.4  PLT 206   BMET  Recent Labs  05/13/16 1553  NA 139  K 3.9  CL 103  CO2 29  GLUCOSE 77  BUN 12  CREATININE 0.75  CALCIUM 9.2    Patient up and ambulating in the halls, currently sitting in the chair, eating breakfast. Patient found to have weakness of the right upper extremity following surgery, Dr. Ronnald Ramp contacted and ordered Decadron 4 mg IV every 6 hours, which continues. He should expect that she also has numbness to the right upper extremity.  Patient explains that prior to surgery she had weakness in her hands bilaterally, as well as a right rotator cuff tear.  Exam this morning shows in the left upper extremity: the left deltoid and biceps are 5, the left triceps and grip are 4, left intrinsics are 5, and in the right upper extremity: The deltoid is 1-2, the biceps is 3, the triceps and grip are 4 minus, and the intrinsics are 4+, and in the lower extremities the iliopsoas are 5 bilaterally.  Patient also describes some itching with recent pain medication.  Wound examined, and is healing nicely. There is no swelling, erythema, ecchymosis, or drainage.  Plan: Significant weakness in right upper extremity. Have requested cervical spine AP and lateral x-rays. We'll discuss with Dr. Ronnald Ramp who is on-call, and who will follow up and review the x-rays. Also requested PT and OT consultations for today, and have spoken with the physical therapist already.  We'll order Benadryl 25-50 mg by mouth every 6 hours when necessary itching. Explained to the patient that the itching is not an allergic reaction, but rather release of  histamine from mast cells from the narcotic medication. Encouraged continued ambulation the halls.  Hosie Spangle, MD 05/16/2016, 7:58 AM

## 2016-05-16 NOTE — Evaluation (Signed)
Occupational Therapy Evaluation Patient Details Name: Breanna [name] MRN: [id number] DOB: 1946-01-20 Today's Date: 05/16/2016    History of Present Illness 71 y.o. female s/p ACDF. PMH consists of L TKA, CVA, HTN, panic attacks and DDD.   Clinical Impression   Pt admitted with above. She demonstrates the below listed deficits and will benefit from continued OT to maximize safety and independence with BADLs.  Pt presents with Rt UE weakness.   She was instructed in initial HEP, and safety with ADLs.  She requires min A for ADLs at this time, and has good family support.   Recommend OPOT.  WIll follow.      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 )    Optician, dispensing  None recommended by OT    Recommendations for [employer]       Precautions / Restrictions Precautions Precautions: Cervical Precaution Comments: Pt instructed in cervical precautions  Required Braces or Orthoses: Cervical Brace Cervical Brace: Hard collar;Other (comment) Restrictions Weight Bearing Restrictions: No      Mobility Bed Mobility Overal bed mobility: Independent                Transfers Overall transfer level: Independent Equipment used: None                  Balance Overall balance assessment: No apparent balance deficits (not formally assessed)                                          ADL Overall ADL's : Needs assistance/impaired Eating/Feeding: Set up;Sitting Eating/Feeding Details (indicate cue type and reason): using Lt UE  Grooming: Wash/dry hands;Wash/dry face;Oral care;Brushing hair;Minimal assistance;Standing   Upper Body Bathing: Minimal assistance;Sitting;Standing   Lower Body Bathing: Minimal assistance;Sit to/from stand   Upper Body Dressing : Minimal assistance;Sitting Upper Body Dressing Details (indicate cue type and reason): reviewed donnind Rt UE first   Lower Body Dressing: Minimal assistance;Sit to/from stand Lower Body Dressing Details (indicate cue type and reason): difficulty with Lt LE - she has AE  Toilet Transfer: Modified Independent;Ambulation;Comfort height toilet;Grab bars   Toileting- Clothing Manipulation and Hygiene: Modified independent;Sit to/from stand   Tub/ Banker: Walk-in shower;Modified independent   Functional mobility during ADLs: Modified independent General ADL Comments: reviewed safe activities including IADLs - no lifting >5lbs.   No lifting laundry from washer, no changing sheets, scrubbing bathrooms, vaccuuming, etc      Vision         Perception     Praxis      Pertinent Vitals/Pain Pain Assessment: No/denies pain Pain Score: 3  Pain Location: sx site Pain Descriptors / Indicators: Sore;Pressure Pain Intervention(s): Monitored during session     Hand Dominance Right   Extremity/Trunk Assessment Upper Extremity Assessment Upper Extremity Assessment: RUE deficits/detail RUE Deficits / Details: shoulder 1/5-2-/5; elbow 2/5 flex; 3/5 ext; wrist 3-/5; hand 4/5 RUE Coordination: decreased fine motor;decreased gross motor   Lower Extremity Assessment Lower Extremity Assessment: Defer to PT evaluation   Cervical / Trunk Assessment Cervical / Trunk Assessment: Normal   Communication Communication Communication: No difficulties   Cognition Arousal/Alertness: Awake/alert Behavior During Therapy: WFL for tasks assessed/performed Overall Cognitive Status: Within Functional Limits for tasks assessed  General Comments       Exercises Exercises: Other exercises Other Exercises Other Exercises: Pt performed AAROM shoulder flexion/ext; abd/add x 10.   Elbow flex/ext isometric >Eccentric > concentric series x 5 followed by AROM x 10.   Pt provided with foam built up handles for self feeding, red theraputty and therapy ball.  SHe was instructed to perform lap  slides and table slides with good alignment of shoulder.   Attempted wall walking, but resulted in increased shoulder pain    Shoulder Instructions      Home Living Family/patient expects to be discharged to:: Private residence Living Arrangements: Spouse/significant other Available Help at Discharge: Family;Available 24 hours/day Type of Home: House Home Access: Level entry     Home Layout: Two level;Able to live on main level with bedroom/bathroom Alternate Level Stairs-Number of Steps: flight   Bathroom Shower/Tub: Walk-in shower   Bathroom Toilet: Handicapped height     Home Equipment: Environmental consultant - 2 wheels;Bedside commode;Adaptive equipment Adaptive Equipment: Reacher;Sock aid Additional Comments: Pt will have 24 hour assist from family at discharge       Prior Functioning/Environment Level of Independence: Independent        Comments: retired Education officer, environmental         OT Problem List: Decreased strength;Decreased range of motion;Decreased coordination;Decreased knowledge of use of DME or AE;Decreased knowledge of precautions;Impaired UE functional use      OT Treatment/Interventions: Self-care/ADL training;Therapeutic exercise;Neuromuscular education;DME and/or AE instruction;Therapeutic activities;Patient/family education    OT Goals(Current goals can be found in the care plan section) Acute Rehab OT Goals Patient Stated Goal: home  OT Goal Formulation: With patient/family Time For Goal Achievement: 05/23/16 Potential to Achieve Goals: Good ADL Goals Pt Will Perform Eating: with modified independence;with adaptive utensils Pt Will Perform Grooming: with modified independence;with adaptive equipment Pt Will Perform Upper Body Bathing: with modified independence;with adaptive equipment;sitting;standing Pt Will Perform Lower Body Bathing: with modified independence;with adaptive equipment;sit to/from stand Pt Will Perform Upper Body Dressing: with modified independence;with  adaptive equipment;sitting;standing Pt Will Perform Lower Body Dressing: with modified independence;with adaptive equipment;sit to/from stand Pt/caregiver will Perform Home Exercise Program: Increased ROM;Increased strength;Right Upper extremity;With Supervision;With written HEP provided  OT Frequency: Min 2X/week   Barriers to D/C:            Co-evaluation              End of Session Equipment Utilized During Treatment: Cervical collar Nurse Communication: Mobility status  Activity Tolerance: Patient tolerated treatment well Patient left: in chair;with call bell/phone within reach;with family/visitor present  OT Visit Diagnosis: Muscle weakness (generalized) (M62.81)                ADL either performed or assessed with clinical judgement  Time: 4970-2637 OT Time Calculation (min): 53 min Charges:  OT General Charges $OT Visit: 1 Procedure OT Evaluation $OT Eval Low Complexity: 1 Procedure OT Treatments $Self Care/Home Management : 8-22 mins $Therapeutic Exercise: 8-22 mins G-Codes:     .Adaline Sill, Asli Tokarski M 05/16/2016, 1:38 PM

## 2016-05-16 NOTE — Evaluation (Signed)
Physical Therapy Evaluation Patient Details Name: Breanna [name] MRN: [id number] DOB: 03/24/1945 Today's Date: 05/16/2016   History of Present Illness  71 y.o. female s/p ACDF. PMH consists of L TKA, CVA, HTN, panic attacks and DDD.  Clinical Impression  PT eval complete. Pt is independent with all functional mobility. Supervision provided for ascend/descend one flight of stairs. Pt will have husband at home for supervision/assist. No further PT intervention indicated. PT signing off.    Follow Up Recommendations No PT follow up    Equipment Recommendations  None recommended by PT    Recommendations for Other Services       Precautions / Restrictions Precautions Precautions: Cervical Precaution Comments: OT educated pt on cervical precautions just prior to PT eval. Brief review completed. Required Braces or Orthoses: Cervical Brace Cervical Brace: Hard collar;Other (comment) (On when walking)      Mobility  Bed Mobility Overal bed mobility: Independent                Transfers Overall transfer level: Independent Equipment used: None                Ambulation/Gait Ambulation/Gait assistance: Independent Ambulation Distance (Feet): 500 Feet Assistive device: None Gait Pattern/deviations: WFL(Within Functional Limits)   Gait velocity interpretation: at or above normal speed for age/gender    Stairs Stairs: Yes Stairs assistance: Supervision Stair Management: One rail Left;Step to pattern;Forwards Number of Stairs: 12    Wheelchair Mobility    Modified Rankin (Stroke Patients Only)       Balance Overall balance assessment: No apparent balance deficits (not formally assessed)                                           Pertinent Vitals/Pain Pain Assessment: 0-10 Pain Score: 3  Pain Location: sx site Pain Descriptors / Indicators: Sore;Pressure Pain Intervention(s): Monitored during session    Home Living Family/patient  expects to be discharged to:: Private residence Living Arrangements: Spouse/significant other Available Help at Discharge: Family;Available 24 hours/day Type of Home: House Home Access: Level entry     Home Layout: Two level;Able to live on main level with bedroom/bathroom Home Equipment: Gilford Rile - 2 wheels;Bedside commode      Prior Function Level of Independence: Independent               Hand Dominance        Extremity/Trunk Assessment   Upper Extremity Assessment Upper Extremity Assessment: Defer to OT evaluation    Lower Extremity Assessment Lower Extremity Assessment: Overall WFL for tasks assessed    Cervical / Trunk Assessment Cervical / Trunk Assessment: Normal  Communication   Communication: No difficulties  Cognition Arousal/Alertness: Awake/alert Behavior During Therapy: WFL for tasks assessed/performed Overall Cognitive Status: Within Functional Limits for tasks assessed                      General Comments      Exercises     Assessment/Plan    PT Assessment Patent does not need any further PT services  PT Problem List         PT Treatment Interventions      PT Goals (Current goals can be found in the Care Plan section)  Acute Rehab PT Goals Patient Stated Goal: home today PT Goal Formulation: All assessment and education complete, DC therapy    Frequency  Barriers to discharge        Co-evaluation               End of Session Equipment Utilized During Treatment: Gait belt;Cervical collar Activity Tolerance: Patient tolerated treatment well Patient left: in chair;with call bell/phone within reach;with family/visitor present Nurse Communication: Mobility status PT Visit Diagnosis: Difficulty in walking, not elsewhere classified (R26.2)         Time: 3557-3220 PT Time Calculation (min) (ACUTE ONLY): 11 min   Charges:   PT Evaluation $PT Eval Low Complexity: 1 Procedure     PT G CodesLorriane Shire 05/16/2016, 12:56 PM

## 2016-05-17 NOTE — Progress Notes (Signed)
Patient ID: Breanna [name], female   DOB: 10-19-1945, 71 y.o.   MRN: [id number] Subjective: Patient reports some chest/ back pain as seen after ACDF, no arm pain, feels strength may be ever so slightly improved  Objective: Vital signs in last 24 hours: Temp:  [97.9 F (36.6 C)-98.1 F (36.7 C)] 98.1 F (36.7 C) (03/11 0522) Pulse Rate:  [75-82] 78 (03/11 0522) Resp:  [18-20] 20 (03/11 0522) BP: (135-150)/(54-71) 150/68 (03/11 0522) SpO2:  [92 %-95 %] 94 % (03/11 0522)  Intake/Output from previous day: 03/10 0701 - 03/11 0700 In: 869.3 [I.V.:869.3] Out: -  Intake/Output this shift: No intake/output data recorded.  incisions ok, strength L arm ok, strength R hand 4/5, R tricep, 3/5, R wrist ext 4-/5, R delt 1/5, R bi 2/5  Lab Results: Lab Results  Component Value Date   WBC 5.8 05/13/2016   HGB 14.0 05/13/2016   HCT 41.4 05/13/2016   MCV 95.2 05/13/2016   PLT 206 05/13/2016   Lab Results  Component Value Date   INR 1.04 08/02/2014   BMET Lab Results  Component Value Date   NA 139 05/13/2016   K 3.9 05/13/2016   CL 103 05/13/2016   CO2 29 05/13/2016   GLUCOSE 77 05/13/2016   BUN 12 05/13/2016   CREATININE 0.75 05/13/2016   CALCIUM 9.2 05/13/2016    Studies/Results: Dg Cervical Spine 2 Or 3 Views  Result Date: 05/16/2016 CLINICAL DATA:  S/p cervical fusion (C3-4, C4-5, C5-6, C6-7) yesterday EXAM: CERVICAL SPINE - 2-3 VIEW COMPARISON:  Plain film of the cervical spine dated 01/15/2016. FINDINGS: Anterior cervical fusion hardware is now positioned at the C3 through C7 levels, with intervening disc spacers. Hardware appears intact and appropriately positioned. No acute or suspicious osseous finding. There is expected prevertebral soft tissue swelling. IMPRESSION: Status post placement of anterior cervical fusion hardware at the C3 through C7 levels. Hardware appears intact and appropriately positioned. No evidence of surgical complicating feature. Electronically Signed   By:  Franki Cabot M.D.   On: 05/16/2016 10:52   Dg Cervical Spine 2-3 Views  Result Date: 05/15/2016 CLINICAL DATA:  71 y/o  F; C3 through C7 ACDF. EXAM: CERVICAL SPINE - 2-3 VIEW COMPARISON:  None. FINDINGS: The cervical spine is visualized from C1 through C4 on the lateral view. There serial radiographs with a probe at the C3-4 intervertebral disc level and anterior cervical fusion hardware extending inferiorly from C3 obscured by soft tissues. The patient is intubated. There is prevertebral soft tissue swelling which is likely postoperative. IMPRESSION: Intraoperative radiographs of anterior cervical discectomy and fusion. Electronically Signed   By: Kristine Garbe M.D.   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

## 2016-05-17 NOTE — Progress Notes (Signed)
Occupational Therapy Treatment Patient Details Name: Breanna [name] MRN: [id number] DOB: March 22, 1945 Today's Date: 05/17/2016    History of present illness 71 y.o. female s/p ACDF. PMH consists of L TKA, CVA, HTN, panic attacks and DDD.   OT comments  Pt making progress towards OT goals this session. Session focused on HEP and bed mobility (Pt requested bed mobility). Pt continues to require OPOT upon discharge to maximize function in RUE for ADL and meaningful occupations.   Follow Up Recommendations  Supervision/Assistance - 24 hour;Outpatient OT    Equipment Recommendations  None recommended by OT    Recommendations for Other Services      Precautions / Restrictions Precautions Precautions: Cervical Precaution Comments: Pt instructed in cervical precautions  Required Braces or Orthoses: Cervical Brace Cervical Brace: Hard collar;Other (comment) Restrictions Weight Bearing Restrictions: No       Mobility Bed Mobility Overal bed mobility: Independent             General bed mobility comments: Pt wanted to practice this during the session as she's been having difficulty with the right side. Pt at independent level when she gets out on the right side of the bed with no rail.   Transfers Overall transfer level: Independent Equipment used: None             General transfer comment: able to bear weight through RUE for transfers    Balance Overall balance assessment: No apparent balance deficits (not formally assessed)                                 ADL                                       Functional mobility during ADLs: Modified independent General ADL Comments: Pt very aware of precautions and feels comfortable with ADL/IADL and very supportive family      Vision                     Perception     Praxis      Cognition   Behavior During Therapy: WFL for tasks assessed/performed Overall Cognitive Status: Within  Functional Limits for tasks assessed                         Exercises Other Exercises Other Exercises: Pt performed AAROM shoulder flexion/ext; abd/add x 10.   Elbow flex/ext isometric >Eccentric > concentric series x 5 followed by AROM x 10.   Shoulder Instructions       General Comments      Pertinent Vitals/ Pain       Pain Assessment: 0-10 Pain Score: 7  Pain Location: sx site, check and back Pain Descriptors / Indicators: Sore;Pressure Pain Intervention(s): Limited activity within patient's tolerance;Monitored during session;Repositioned  Home Living                                          Prior Functioning/Environment              Frequency  Min 2X/week        Progress Toward Goals  OT Goals(current goals can now be found in the care plan section)  Progress towards  OT goals: Progressing toward goals  Acute Rehab OT Goals Patient Stated Goal: home  OT Goal Formulation: With patient/family Time For Goal Achievement: 05/23/16 Potential to Achieve Goals: Good  Plan Discharge plan remains appropriate    Co-evaluation                 End of Session Equipment Utilized During Treatment: Cervical collar  OT Visit Diagnosis: Muscle weakness (generalized) (M62.81)   Activity Tolerance Patient tolerated treatment well   Patient Left in chair;with call bell/phone within reach   Nurse Communication Mobility status        Time: 3818-4037 OT Time Calculation (min): 35 min  Charges: OT General Charges $OT Visit: 1 Procedure OT Treatments $Self Care/Home Management : 8-22 mins $Therapeutic Exercise: 8-22 mins  Hulda Humphrey OTR/L [place] 05/17/2016, 1:38 PM

## 2016-05-18 ENCOUNTER — Encounter (HOSPITAL_COMMUNITY): Payer: Self-pay | Admitting: Neurosurgery

## 2016-05-18 MED ORDER — TIZANIDINE HCL 4 MG PO TABS: 4.0000 mg | ORAL_TABLET | Freq: Four times a day (QID) | ORAL | 0 refills | Status: DC | PRN

## 2016-05-18 MED ORDER — TRAMADOL HCL 50 MG PO TABS: 50.0000 mg | ORAL_TABLET | Freq: Four times a day (QID) | ORAL | 0 refills | Status: DC | PRN

## 2016-05-18 NOTE — Discharge Summary (Signed)
Physician Discharge Summary  Patient ID: Breanna [name] MRN: [id number] DOB/AGE: 1946/01/25 71 y.o.  Admit date: 05/15/2016 Discharge date: 05/18/2016  Admission Diagnoses:Osteoarthritis of spine with radiculopathy, cervical region C3-7  Discharge Diagnoses: C3-7 Active Problems:   Osteoarthritis of spine with radiculopathy, cervical region   Discharged Condition: good  Hospital Course: Breanna [name] was admitted to the hospital and taken to the operating room for an uncomplicated acdf U3-8.  Post op she developed a neuropraxic right C5 root with weakness in the deltoid. She is unable to abduct the right arm. She was also weaker in the right triceps and biceps, which is improving since its onset Saturday.  Wound is clean, dry, and without signs of infection. She is tolerating a regular diet, ambulating, and voiding without difficulty.   Treatments: surgery: Anterior Cervical decompression C3/4,4/5,5/6,6/7 Arthrodesis C3/4 6 mm 4/5 40mm, 5/6 10mm, and 6/7 104mm structural allografts Anterior instrumentation(Nuvasive translational plate) C3-7   Discharge Exam: Blood pressure (!) 144/58, pulse (!) 57, temperature 98.9 F (37.2 C), temperature source Oral, resp. rate 18, height 5\' 4"  (1.626 m), weight 98.4 kg (217 lb), SpO2 96 %. General appearance: alert, cooperative, appears stated age and no distress Neurologic: Motor: 4/5 biceps, triceps right, right intrinsics 4/5. deltoid 2/5 right  Disposition: 06-Home-Health Care Svc OSTEOARTHRITIS OF FACET JOINT OF CERVICAL SPINE  Allergies as of 05/18/2016      Reactions   Atorvastatin Other (See Comments)   Myalgia   Lactose Diarrhea   Mometasone Nausea And Vomiting, Other (See Comments)   headache   Nasonex [mometasone Furoate] Other (See Comments)   headache   Neurontin [gabapentin] Other (See Comments)   depression      Medication List    TAKE these medications   albuterol 108 (90 Base) MCG/ACT inhaler Commonly known as:  PROVENTIL  HFA;VENTOLIN HFA Inhale 2 puffs into the lungs every 4 (four) hours as needed for wheezing or shortness of breath.   amLODipine 5 MG tablet Commonly known as:  NORVASC Take 5 mg by mouth daily.   azithromycin 250 MG tablet Commonly known as:  ZITHROMAX Take 250 mg by mouth daily. Scheduled to take last dose - on 04/07/2016   carvedilol 25 MG tablet Commonly known as:  COREG Take 25 mg by mouth 2 (two) times daily with a meal.   clopidogrel 75 MG tablet Commonly known as:  PLAVIX Take 75 mg by mouth daily.   dexlansoprazole 60 MG capsule Commonly known as:  DEXILANT Take 60 mg by mouth daily.   estradiol 1 MG tablet Commonly known as:  ESTRACE Take 1 mg by mouth daily.   fexofenadine 180 MG tablet Commonly known as:  ALLEGRA Take 180 mg by mouth daily. Alternates between allegra and xyzal   fluticasone 50 MCG/ACT nasal spray Commonly known as:  FLONASE Place 1 spray into both nostrils daily.   hydrochlorothiazide 25 MG tablet Commonly known as:  HYDRODIURIL Take 25 mg by mouth daily.   levocetirizine 5 MG tablet Commonly known as:  XYZAL Take 5 mg by mouth daily. Alternates between allegra and xyzal   lisinopril 40 MG tablet Commonly known as:  PRINIVIL,ZESTRIL Take 40 mg by mouth daily.   PARoxetine 10 MG tablet Commonly known as:  PAXIL Take 10 mg by mouth every evening.   potassium chloride 10 MEQ tablet Commonly known as:  K-DUR,KLOR-CON Take 10 mEq by mouth daily.   predniSONE 5 MG (21) Tbpk tablet Commonly known as:  STERAPRED UNI-PAK 21 TAB Take 5 mg  by mouth daily. This Rx was a dose pak.   tiZANidine 4 MG tablet Commonly known as:  ZANAFLEX Take 1 tablet (4 mg total) by mouth every 6 (six) hours as needed for muscle spasms.   traMADol 50 MG tablet Commonly known as:  ULTRAM Take 50 mg by mouth every 6 (six) hours as needed for moderate pain. What changed:  Another medication with the same name was added. Make sure you understand how and when to  take each.   traMADol 50 MG tablet Commonly known as:  ULTRAM Take 1 tablet (50 mg total) by mouth every 6 (six) hours as needed for moderate pain. What changed:  You were already taking a medication with the same name, and this prescription was added. Make sure you understand how and when to take each.   TYLENOL 325 MG tablet Generic drug:  acetaminophen Take 650 mg by mouth every 4 (four) hours as needed for mild pain or headache.      Follow-up Information    Catori Panozzo L, MD Follow up in 3 week(s).   Specialty:  Neurosurgery Why:  please call to make an appointment Contact information: [address] [place] [address] [phone]           Signed: Winfield Cunas 05/18/2016, 4:34 PM

## 2016-05-18 NOTE — Care Management Note (Signed)
Case Management Note  Patient Details  Name: Sher Shampine MRN: [id number] Date of Birth: Apr 01, 1945  Subjective/Objective:          Patient was admitted for an ACDF. Lives at home with spouse. CM will follow for discharge needs pending therapy evals and physician orders.           Action/Plan:   Expected Discharge Date:                  Expected Discharge Plan:     In-House Referral:     Discharge planning Services     Post Acute Care Choice:    Choice offered to:     DME Arranged:    DME Agency:     HH Arranged:    HH Agency:     Status of Service:     If discussed at [employer] of Stay Meetings, dates discussed:    Additional Comments:  Rolm Baptise, RN 05/18/2016, 10:33 AM

## 2016-05-18 NOTE — Discharge Instructions (Signed)

## 2016-05-18 NOTE — Progress Notes (Signed)
Patient is discharged from room 5C13 at this time. Alert and in stable condition. Instructions read to patient with understanding verbalized. Left unit via wheelchair with husband and all belongings at side.

## 2016-05-26 ENCOUNTER — Emergency Department (HOSPITAL_COMMUNITY)
Admission: EM | Admit: 2016-05-26 | Discharge: 2016-05-26 | Disposition: A | Discharge status: Home / Self Care | Payer: Medicare Other | Attending: Emergency Medicine | Admitting: Emergency Medicine

## 2016-05-26 ENCOUNTER — Emergency Department (HOSPITAL_COMMUNITY): Payer: Medicare Other

## 2016-05-26 ENCOUNTER — Encounter (HOSPITAL_COMMUNITY): Payer: Self-pay | Admitting: *Deleted

## 2016-05-26 DIAGNOSIS — M79601 Pain in right arm: Secondary | ICD-10-CM | POA: Diagnosis not present

## 2016-05-26 DIAGNOSIS — G8918 Other acute postprocedural pain: Secondary | ICD-10-CM | POA: Insufficient documentation

## 2016-05-26 DIAGNOSIS — I10 Essential (primary) hypertension: Secondary | ICD-10-CM | POA: Diagnosis not present

## 2016-05-26 DIAGNOSIS — Z79899 Other long term (current) drug therapy: Secondary | ICD-10-CM | POA: Insufficient documentation

## 2016-05-26 DIAGNOSIS — Z96652 Presence of left artificial knee joint: Secondary | ICD-10-CM | POA: Diagnosis not present

## 2016-05-26 DIAGNOSIS — Z87891 Personal history of nicotine dependence: Secondary | ICD-10-CM | POA: Insufficient documentation

## 2016-05-26 DIAGNOSIS — Z8673 Personal history of transient ischemic attack (TIA), and cerebral infarction without residual deficits: Secondary | ICD-10-CM | POA: Insufficient documentation

## 2016-05-26 MED ORDER — MORPHINE SULFATE (PF) 4 MG/ML IV SOLN
4.0000 mg | Freq: Once | INTRAVENOUS | Status: AC
  Administered 2016-05-26: 4 mg via INTRAVENOUS
  Filled 2016-05-26: qty 1

## 2016-05-26 MED ORDER — MORPHINE SULFATE 15 MG PO TABS: 7.5000 mg | ORAL_TABLET | ORAL | 0 refills | Status: DC | PRN

## 2016-05-26 MED ORDER — LORAZEPAM 2 MG/ML IJ SOLN
1.0000 mg | Freq: Once | INTRAMUSCULAR | Status: AC
  Administered 2016-05-26: 1 mg via INTRAVENOUS
  Filled 2016-05-26: qty 1

## 2016-05-26 NOTE — ED Notes (Signed)
Patient transported to MRI 

## 2016-05-26 NOTE — ED Notes (Signed)
Pt lying in bed; states morphine helped pain a little; pain was radiating into both arms and up in head. Now pain just in back of neck.
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
## 2016-05-26 NOTE — ED Notes (Signed)
Pt did not need anything at this time  

## 2016-05-26 NOTE — ED Provider Notes (Signed)
Patient back from MRI, w ongoing pain.  MRI IMPRESSION: 1. Anterior cervical fusion from C3 through C7. 1.5 x 4.6 x 8.7 cm prevertebral fluid along the anterior cervical hardware from C3 through C7 with a small neck extending along the left side of the neck along the medial margin of the left sternocleidomastoid muscle likely reflecting a postoperative seroma. 3 x 9 x 10 mm left anterior epidural hematoma along C2-3. Neurosurgery consultation recommended. 2. Cervical spine spondylosis as described above.   Electronically Signed By: Kathreen Devoid On: 05/26/2016 09:36   I discussed the patient's MRI results with her neurosurgeon. Patient will have instructions to wear her rigid collar more frequently, as well as be discharged with additional analgesia. On exam the patient is awake and alert. I discussed the findings, and a conversation with her and her family members. Patient indicates that she does tolerate higher strength analgesia, was discharged with same to follow-up in the neurosurgery clinic.    Carmin Muskrat, MD 05/26/16 1054

## 2016-05-26 NOTE — ED Triage Notes (Signed)
Patient states she has a level 4 neck surgery on 3/9 c/o right pain and weakness since the surgery, states her neck is hurting worse hasn't followed up with Dr. Cyndy Freeze yet

## 2016-05-26 NOTE — ED Notes (Signed)
Pt reports having 4 cervical disks removed on 05/15/16. Pt reports having pain to her bilateral arms, neck, and face since surgery. Pt just called on 05/25/16 about this pain and she is still waiting to hear back from them.

## 2016-05-26 NOTE — ED Notes (Signed)
Pt returned from MRI; pain in neck is 8/10; MD informed; warm blanket given. She reports she just feels tired.

## 2016-05-26 NOTE — Discharge Instructions (Signed)
As discussed, your evaluation today has been largely reassuring.  But, it is important that you monitor your condition carefully, and do not hesitate to return to the ED if you develop new, or concerning changes in your condition. ? ?Otherwise, please follow-up with your physician for appropriate ongoing care. ? ?

## 2016-05-27 NOTE — ED Notes (Signed)
Ativan wasted in sharps box in POD A medication room.

## 2016-08-19 ENCOUNTER — Other Ambulatory Visit: Payer: Self-pay | Admitting: Orthopedic Surgery

## 2016-08-19 ENCOUNTER — Ambulatory Visit
Admission: RE | Admit: 2016-08-19 | Discharge: 2016-08-19 | Disposition: A | Discharge status: Home / Self Care | Payer: Medicare Other | Source: Ambulatory Visit | Attending: Orthopedic Surgery | Admitting: Orthopedic Surgery

## 2016-08-19 DIAGNOSIS — M5412 Radiculopathy, cervical region: Secondary | ICD-10-CM

## 2016-09-07 ENCOUNTER — Other Ambulatory Visit: Payer: Self-pay | Admitting: Orthopedic Surgery

## 2016-09-18 ENCOUNTER — Encounter (HOSPITAL_COMMUNITY): Payer: Self-pay

## 2016-09-18 ENCOUNTER — Encounter (HOSPITAL_COMMUNITY)
Admission: RE | Admit: 2016-09-18 | Discharge: 2016-09-18 | Disposition: A | Discharge status: Home / Self Care | Payer: Medicare Other | Source: Ambulatory Visit | Attending: Orthopedic Surgery | Admitting: Orthopedic Surgery

## 2016-09-18 ENCOUNTER — Other Ambulatory Visit (HOSPITAL_COMMUNITY): Payer: Self-pay

## 2016-09-18 DIAGNOSIS — Z01810 Encounter for preprocedural cardiovascular examination: Secondary | ICD-10-CM | POA: Insufficient documentation

## 2016-09-18 DIAGNOSIS — M79601 Pain in right arm: Secondary | ICD-10-CM | POA: Insufficient documentation

## 2016-09-18 DIAGNOSIS — Z01812 Encounter for preprocedural laboratory examination: Secondary | ICD-10-CM | POA: Insufficient documentation

## 2016-09-18 HISTORY — DX: Personal history of other diseases of the digestive system: Z87.19

## 2016-09-18 LAB — URINALYSIS, ROUTINE W REFLEX MICROSCOPIC
Bilirubin Urine: NEGATIVE
Glucose, UA: NEGATIVE mg/dL
Hgb urine dipstick: NEGATIVE
KETONES UR: NEGATIVE mg/dL
LEUKOCYTES UA: NEGATIVE
NITRITE: NEGATIVE
PH: 6 (ref 5.0–8.0)
Protein, ur: NEGATIVE mg/dL
SPECIFIC GRAVITY, URINE: 1.015 (ref 1.005–1.030)

## 2016-09-18 LAB — CBC WITH DIFFERENTIAL/PLATELET
BASOS ABS: 0 10*3/uL (ref 0.0–0.1)
Basophils Relative: 1 %
EOS PCT: 2 %
Eosinophils Absolute: 0.1 10*3/uL (ref 0.0–0.7)
HCT: 39.3 % (ref 36.0–46.0)
Hemoglobin: 12.9 g/dL (ref 12.0–15.0)
LYMPHS PCT: 42 %
Lymphs Abs: 2 10*3/uL (ref 0.7–4.0)
MCH: 30.9 pg (ref 26.0–34.0)
MCHC: 32.8 g/dL (ref 30.0–36.0)
MCV: 94.2 fL (ref 78.0–100.0)
MONO ABS: 0.6 10*3/uL (ref 0.1–1.0)
Monocytes Relative: 12 %
Neutro Abs: 2.1 10*3/uL (ref 1.7–7.7)
Neutrophils Relative %: 43 %
PLATELETS: 198 10*3/uL (ref 150–400)
RBC: 4.17 MIL/uL (ref 3.87–5.11)
RDW: 12.9 % (ref 11.5–15.5)
WBC: 4.8 10*3/uL (ref 4.0–10.5)

## 2016-09-18 LAB — COMPREHENSIVE METABOLIC PANEL
ALBUMIN: 3.6 g/dL (ref 3.5–5.0)
ALT: 25 U/L (ref 14–54)
AST: 30 U/L (ref 15–41)
Alkaline Phosphatase: 77 U/L (ref 38–126)
Anion gap: 7 (ref 5–15)
BUN: 11 mg/dL (ref 6–20)
CHLORIDE: 103 mmol/L (ref 101–111)
CO2: 29 mmol/L (ref 22–32)
CREATININE: 0.57 mg/dL (ref 0.44–1.00)
Calcium: 8.8 mg/dL — ABNORMAL LOW (ref 8.9–10.3)
GFR calc Af Amer: >60 mL/min (ref >60)
GFR calc non Af Amer: >60 mL/min (ref >60)
GLUCOSE: 111 mg/dL — AB (ref 65–99)
POTASSIUM: 3.4 mmol/L — AB (ref 3.5–5.1)
SODIUM: 139 mmol/L (ref 135–145)
Total Bilirubin: 0.4 mg/dL (ref 0.3–1.2)
Total Protein: 6.1 g/dL — ABNORMAL LOW (ref 6.5–8.1)

## 2016-09-18 LAB — PROTIME-INR
INR: 1
PROTHROMBIN TIME: 13.2 s (ref 11.4–15.2)

## 2016-09-18 LAB — APTT: APTT: 30 s (ref 24–36)

## 2016-09-18 LAB — TYPE AND SCREEN
ABO/RH(D): O NEG
ANTIBODY SCREEN: NEGATIVE

## 2016-09-18 LAB — SURGICAL PCR SCREEN
MRSA, PCR: NEGATIVE
Staphylococcus aureus: NEGATIVE

## 2016-09-18 NOTE — Pre-Procedure Instructions (Signed)
Breanna [name]  09/18/2016      [place], [place] [address] Phone: [phone] Fax: [phone]    Your procedure is scheduled on September 23, 2016  Report to [facility] Admitting Entrance "A" at 8:00 A.M.  Call this number if you have problems the morning of surgery:  [phone]   Remember:  Do not eat food or drink liquids after midnight on September 22, 2016  Take these medicines the morning of surgery with A SIP OF WATER: AmLODipine (NORVASC), Carvedilol (COREG), SYSTANE OP), and Estradiol (ESTRACE). If needed Acetaminophen (TYLENOL), Fluticasone (FLONASE), Levocetirizine (XYZAL), and TraMADol (ULTRAM).  Consult with your doctor about when to stop your Plavix.  7 days before surgery,stop taking all Aspirins, Vitamins, Fish oils, and Herbal medications. Also stop all NSAIDS i.e. Advil, Motrin, Aleve, Anaprox, Naproxen, BC and Goody Powders.   Do not wear jewelry, make-up or nail polish.  Do not wear lotions, powders, or perfumes, or deoderant.  Do not shave 48 hours prior to surgery.    Do not bring valuables to the hospital.  [facility] is not responsible for any belongings or valuables.  Contacts, dentures or bridgework may not be worn into surgery.  Leave your suitcase in the car.  After surgery it may be brought to your room.  For patients admitted to the hospital, discharge time will be determined by your treatment team.  Patients discharged the day of surgery will not be allowed to drive home.   Special instructions:   [facility]- Preparing For Surgery  Before surgery, you can play an important role. Because skin is not sterile, your skin needs to be as free of germs as possible. You can reduce the number of germs on your skin by washing with CHG (chlorahexidine gluconate) Soap before surgery.  CHG is an antiseptic cleaner which kills germs and bonds with the skin to continue killing  germs even after washing.  Please do not use if you have an allergy to CHG or antibacterial soaps. If your skin becomes reddened/irritated stop using the CHG.  Do not shave (including legs and underarms) for at least 48 hours prior to first CHG shower. It is OK to shave your face.  Please follow these instructions carefully.   1. Shower the NIGHT BEFORE SURGERY and the MORNING OF SURGERY with CHG.   2. If you chose to wash your hair, wash your hair first as usual with your normal shampoo.  3. After you shampoo, rinse your hair and body thoroughly to remove the shampoo.  4. Use CHG as you would any other liquid soap. You can apply CHG directly to the skin and wash gently with a scrungie or a clean washcloth.   5. Apply the CHG Soap to your body ONLY FROM THE NECK DOWN.  Do not use on open wounds or open sores. Avoid contact with your eyes, ears, mouth and genitals (private parts). Wash genitals (private parts) with your normal soap.  6. Wash thoroughly, paying special attention to the area where your surgery will be performed.  7. Thoroughly rinse your body with warm water from the neck down.  8. DO NOT shower/wash with your normal soap after using and rinsing off the CHG Soap.  9. Pat yourself dry with a CLEAN TOWEL.   10. Wear CLEAN PAJAMAS   11. Place CLEAN SHEETS on your bed the night of your first shower and DO  NOT SLEEP WITH PETS.  Day of Surgery: Do not apply any deodorants/lotions. Please wear clean clothes to the hospital/surgery center.    Please read over the following fact sheets that you were given. Pain Booklet, Coughing and Deep Breathing, MRSA Information and Surgical Site Infection Prevention

## 2016-09-18 NOTE — Pre-Procedure Instructions (Addendum)
Breanna [name]  09/18/2016      [place], [place] [address] Phone: [phone] Fax: [phone]    Your procedure is scheduled on September 23, 2016  Report to [facility] Admitting Entrance "A" at 925 A.M.  Call this number if you have problems the morning of surgery:  [phone]   Remember:  Do not eat food or drink liquids after midnight on September 22, 2016  Take these medicines the morning of surgery with A SIP OF WATER: AmLODipine (NORVASC), Carvedilol (COREG), SYSTANE eye drops if needed and Estradiol (ESTRACE)   . If needed Acetaminophen (TYLENOL), ,or TraMADol (ULTRAM).  Consult with your doctor about when to stop your Plavix.   before surgery,stop today (09/18/16) taking all Aspirins, Vitamins, Fish oils, and Herbal medications. Also stop all NSAIDS i.e. Advil, Motrin, Aleve, Anaprox, Naproxen, BC and Goody Powders.   Do not wear jewelry, make-up or nail polish.  Do not wear lotions, powders, or perfumes, or deoderant.  Do not shave 48 hours prior to surgery.    Do not bring valuables to the hospital.  [facility] is not responsible for any belongings or valuables.  Contacts, dentures or bridgework may not be worn into surgery.  Leave your suitcase in the car.  After surgery it may be brought to your room.  For patients admitted to the hospital, discharge time will be determined by your treatment team.  Patients discharged the day of surgery will not be allowed to drive home.   Special instructions:   [facility]- Preparing For Surgery  Before surgery, you can play an important role. Because skin is not sterile, your skin needs to be as free of germs as possible. You can reduce the number of germs on your skin by washing with CHG (chlorahexidine gluconate) Soap before surgery.  CHG is an antiseptic cleaner which kills germs and bonds with the skin to continue killing germs even after  washing.  Please do not use if you have an allergy to CHG or antibacterial soaps. If your skin becomes reddened/irritated stop using the CHG.  Do not shave (including legs and underarms) for at least 48 hours prior to first CHG shower. It is OK to shave your face.  Please follow these instructions carefully.   1. Shower the NIGHT BEFORE SURGERY and the MORNING OF SURGERY with CHG.   2. If you chose to wash your hair, wash your hair first as usual with your normal shampoo.  3. After you shampoo, rinse your hair and body thoroughly to remove the shampoo.  4. Use CHG as you would any other liquid soap. You can apply CHG directly to the skin and wash gently with a scrungie or a clean washcloth.   5. Apply the CHG Soap to your body ONLY FROM THE NECK DOWN.  Do not use on open wounds or open sores. Avoid contact with your eyes, ears, mouth and genitals (private parts). Wash genitals (private parts) with your normal soap.  6. Wash thoroughly, paying special attention to the area where your surgery will be performed.  7. Thoroughly rinse your body with warm water from the neck down.  8. DO NOT shower/wash with your normal soap after using and rinsing off the CHG Soap.  9. Pat yourself dry with a CLEAN TOWEL.   10. Wear CLEAN PAJAMAS   11. Place CLEAN SHEETS on your bed the night of your first  shower and DO NOT SLEEP WITH PETS.  Day of Surgery: Do not apply any deodorants/lotions. Please wear clean clothes to the hospital/surgery center.    Please read over the following fact sheets that you were given. Pain Booklet, Coughing and Deep Breathing, MRSA Information and Surgical Site Infection Prevention

## 2016-09-21 NOTE — Progress Notes (Signed)
Anesthesia Chart Review:  Pt is a 71 scheduled for C3-4, C4-5, C5-6, C6-7 posterior cervical decompression, fusion with instrumentation and allograft on 09/23/2016 with Phylliss Bob, MD  - PCP is Sherrilee Gilles, DO in [place], [place].   PMH includes:  HTN, TIA, GERD. Former smoker. BMI 36. S/p ACDF 05/15/16. S/p L TKA 08/14/14.   Medications include: Amlodipine, carvedilol, Plavix, HCTZ, lisinopril, potassium. Last dose Plavix 09/17/16.  Preoperative labs reviewed.    CXR 04/07/16: No active cardiopulmonary disease.  EKG 09/18/16: Sinus bradycardia (57 bpm). LVH. Septal infarct, age undetermined. Nonspecific ST abnormality. No significant change when compared to EKG 03/27/16 (found in correspondence 05/19/16 in media tab) and EKG 06/05/14 (found in correspondence 08/17/14 in media tab).  Stress echo 10/02/13 (correspondence 08/17/14 in media tab):  - Negative for ischemia. Achieved 9.6 METS. LV size normal. LV global systolic function normal. EF 65%. No LV regional wall motion abnormalities.  Pt tolerated ACDF 05/15/16 without anesthesia issue. If no changes, I anticipate pt can proceed with surgery as scheduled.   Willeen Cass, FNP-BC [facility] Short Stay Surgical Center/Anesthesiology Phone: [phone] 09/21/2016 1:41 PM
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
## 2016-09-23 ENCOUNTER — Inpatient Hospital Stay (HOSPITAL_COMMUNITY): Payer: Medicare Other

## 2016-09-23 ENCOUNTER — Inpatient Hospital Stay (HOSPITAL_COMMUNITY)
Admission: RE | Admit: 2016-09-23 | Discharge: 2016-09-24 | DRG: 030 | Disposition: A | Discharge status: Home / Self Care | Payer: Medicare Other | Source: Ambulatory Visit | Attending: Orthopedic Surgery | Admitting: Orthopedic Surgery

## 2016-09-23 ENCOUNTER — Inpatient Hospital Stay (HOSPITAL_COMMUNITY): Payer: Medicare Other | Admitting: Vascular Surgery

## 2016-09-23 ENCOUNTER — Encounter (HOSPITAL_COMMUNITY)
Admission: RE | Disposition: A | Discharge status: Home / Self Care | Payer: Self-pay | Source: Ambulatory Visit | Attending: Orthopedic Surgery

## 2016-09-23 ENCOUNTER — Encounter (HOSPITAL_COMMUNITY): Payer: Self-pay | Admitting: *Deleted

## 2016-09-23 ENCOUNTER — Inpatient Hospital Stay (HOSPITAL_COMMUNITY): Payer: Medicare Other | Admitting: Anesthesiology

## 2016-09-23 DIAGNOSIS — Z7902 Long term (current) use of antithrombotics/antiplatelets: Secondary | ICD-10-CM | POA: Diagnosis not present

## 2016-09-23 DIAGNOSIS — F419 Anxiety disorder, unspecified: Secondary | ICD-10-CM | POA: Diagnosis present

## 2016-09-23 DIAGNOSIS — Z87891 Personal history of nicotine dependence: Secondary | ICD-10-CM

## 2016-09-23 DIAGNOSIS — Z96652 Presence of left artificial knee joint: Secondary | ICD-10-CM | POA: Diagnosis present

## 2016-09-23 DIAGNOSIS — M4802 Spinal stenosis, cervical region: Secondary | ICD-10-CM | POA: Diagnosis present

## 2016-09-23 DIAGNOSIS — Z90710 Acquired absence of both cervix and uterus: Secondary | ICD-10-CM

## 2016-09-23 DIAGNOSIS — Z91011 Allergy to milk products: Secondary | ICD-10-CM

## 2016-09-23 DIAGNOSIS — M5412 Radiculopathy, cervical region: Principal | ICD-10-CM | POA: Diagnosis present

## 2016-09-23 DIAGNOSIS — Z981 Arthrodesis status: Secondary | ICD-10-CM

## 2016-09-23 DIAGNOSIS — Z825 Family history of asthma and other chronic lower respiratory diseases: Secondary | ICD-10-CM

## 2016-09-23 DIAGNOSIS — I10 Essential (primary) hypertension: Secondary | ICD-10-CM | POA: Diagnosis present

## 2016-09-23 DIAGNOSIS — Z7951 Long term (current) use of inhaled steroids: Secondary | ICD-10-CM

## 2016-09-23 DIAGNOSIS — K219 Gastro-esophageal reflux disease without esophagitis: Secondary | ICD-10-CM | POA: Diagnosis present

## 2016-09-23 DIAGNOSIS — Z8673 Personal history of transient ischemic attack (TIA), and cerebral infarction without residual deficits: Secondary | ICD-10-CM | POA: Diagnosis not present

## 2016-09-23 DIAGNOSIS — Z807 Family history of other malignant neoplasms of lymphoid, hematopoietic and related tissues: Secondary | ICD-10-CM

## 2016-09-23 DIAGNOSIS — Z8249 Family history of ischemic heart disease and other diseases of the circulatory system: Secondary | ICD-10-CM | POA: Diagnosis not present

## 2016-09-23 DIAGNOSIS — S0300XA Dislocation of jaw, unspecified side, initial encounter: Secondary | ICD-10-CM | POA: Diagnosis present

## 2016-09-23 DIAGNOSIS — M5410 Radiculopathy, site unspecified: Secondary | ICD-10-CM | POA: Diagnosis present

## 2016-09-23 DIAGNOSIS — Z419 Encounter for procedure for purposes other than remedying health state, unspecified: Secondary | ICD-10-CM

## 2016-09-23 DIAGNOSIS — Z888 Allergy status to other drugs, medicaments and biological substances status: Secondary | ICD-10-CM | POA: Diagnosis not present

## 2016-09-23 DIAGNOSIS — M79601 Pain in right arm: Secondary | ICD-10-CM | POA: Diagnosis present

## 2016-09-23 HISTORY — PX: POSTERIOR CERVICAL FUSION/FORAMINOTOMY: SHX5038

## 2016-09-23 SURGERY — POSTERIOR CERVICAL FUSION/FORAMINOTOMY LEVEL 4
Anesthesia: General | Laterality: Right

## 2016-09-23 MED ORDER — AMLODIPINE BESYLATE 5 MG PO TABS
5.0000 mg | ORAL_TABLET | Freq: Every day | ORAL | Status: DC
  Administered 2016-09-24: 5 mg via ORAL
  Filled 2016-09-23: qty 1

## 2016-09-23 MED ORDER — MORPHINE SULFATE (PF) 4 MG/ML IV SOLN
INTRAVENOUS | Status: AC
  Filled 2016-09-23: qty 1

## 2016-09-23 MED ORDER — ESTRADIOL 1 MG PO TABS
1.0000 mg | ORAL_TABLET | ORAL | Status: DC
  Administered 2016-09-24: 1 mg via ORAL
  Filled 2016-09-23: qty 1

## 2016-09-23 MED ORDER — ARTIFICIAL TEARS OPHTHALMIC OINT
TOPICAL_OINTMENT | OPHTHALMIC | Status: DC | PRN
  Administered 2016-09-23: 1 via OPHTHALMIC

## 2016-09-23 MED ORDER — FENTANYL CITRATE (PF) 100 MCG/2ML IJ SOLN
INTRAMUSCULAR | Status: AC
  Administered 2016-09-23: 50 ug via INTRAVENOUS
  Filled 2016-09-23: qty 2

## 2016-09-23 MED ORDER — ROCURONIUM BROMIDE 50 MG/5ML IV SOLN
INTRAVENOUS | Status: AC
  Filled 2016-09-23: qty 1

## 2016-09-23 MED ORDER — CARVEDILOL 25 MG PO TABS: 25.0000 mg | ORAL_TABLET | Freq: Two times a day (BID) | ORAL | Status: DC

## 2016-09-23 MED ORDER — ACETAMINOPHEN 650 MG RE SUPP: 650.0000 mg | RECTAL | Status: DC | PRN

## 2016-09-23 MED ORDER — FENTANYL CITRATE (PF) 250 MCG/5ML IJ SOLN
INTRAMUSCULAR | Status: AC
  Filled 2016-09-23: qty 5

## 2016-09-23 MED ORDER — HYDROCHLOROTHIAZIDE 25 MG PO TABS
25.0000 mg | ORAL_TABLET | Freq: Every day | ORAL | Status: DC
  Administered 2016-09-24: 25 mg via ORAL
  Filled 2016-09-23: qty 1

## 2016-09-23 MED ORDER — ROCURONIUM BROMIDE 100 MG/10ML IV SOLN
INTRAVENOUS | Status: DC | PRN
  Administered 2016-09-23: 10 mg via INTRAVENOUS
  Administered 2016-09-23: 20 mg via INTRAVENOUS
  Administered 2016-09-23: 50 mg via INTRAVENOUS
  Administered 2016-09-23: 40 mg via INTRAVENOUS
  Administered 2016-09-23: 20 mg via INTRAVENOUS

## 2016-09-23 MED ORDER — MEPERIDINE HCL 25 MG/ML IJ SOLN: 6.2500 mg | INTRAMUSCULAR | Status: DC | PRN

## 2016-09-23 MED ORDER — LACTATED RINGERS IV SOLN
INTRAVENOUS | Status: DC | PRN
  Administered 2016-09-23 (×2): via INTRAVENOUS

## 2016-09-23 MED ORDER — ONDANSETRON HCL 4 MG/2ML IJ SOLN
INTRAMUSCULAR | Status: DC | PRN
  Administered 2016-09-23: 4 mg via INTRAVENOUS

## 2016-09-23 MED ORDER — MENTHOL 3 MG MT LOZG: 1.0000 | LOZENGE | OROMUCOSAL | Status: DC | PRN

## 2016-09-23 MED ORDER — BUPIVACAINE LIPOSOME 1.3 % IJ SUSP
INTRAMUSCULAR | Status: DC | PRN
  Administered 2016-09-23: 20 mL

## 2016-09-23 MED ORDER — ONDANSETRON HCL 4 MG/2ML IJ SOLN: 4.0000 mg | Freq: Four times a day (QID) | INTRAMUSCULAR | Status: DC | PRN

## 2016-09-23 MED ORDER — POVIDONE-IODINE 7.5 % EX SOLN: Freq: Once | CUTANEOUS | Status: DC

## 2016-09-23 MED ORDER — BACITRACIN ZINC 500 UNIT/GM EX OINT
TOPICAL_OINTMENT | CUTANEOUS | Status: DC | PRN
  Administered 2016-09-23: 1 via TOPICAL

## 2016-09-23 MED ORDER — DEXAMETHASONE SODIUM PHOSPHATE 10 MG/ML IJ SOLN
INTRAMUSCULAR | Status: AC
  Filled 2016-09-23: qty 1

## 2016-09-23 MED ORDER — PHENYLEPHRINE HCL 10 MG/ML IJ SOLN
INTRAMUSCULAR | Status: DC | PRN
  Administered 2016-09-23 (×3): 80 ug via INTRAVENOUS
  Administered 2016-09-23 (×2): 40 ug via INTRAVENOUS
  Administered 2016-09-23: 80 ug via INTRAVENOUS

## 2016-09-23 MED ORDER — POLYETHYL GLYCOL-PROPYL GLYCOL 0.4-0.3 % OP GEL: Freq: Every day | OPHTHALMIC | Status: DC

## 2016-09-23 MED ORDER — PROPOFOL 10 MG/ML IV BOLUS
INTRAVENOUS | Status: AC
  Filled 2016-09-23: qty 20

## 2016-09-23 MED ORDER — SODIUM CHLORIDE 0.9 % IV SOLN: 250.0000 mL | INTRAVENOUS | Status: DC

## 2016-09-23 MED ORDER — MIDAZOLAM HCL 5 MG/5ML IJ SOLN
INTRAMUSCULAR | Status: DC | PRN
  Administered 2016-09-23 (×2): 1 mg via INTRAVENOUS

## 2016-09-23 MED ORDER — SENNOSIDES-DOCUSATE SODIUM 8.6-50 MG PO TABS: 1.0000 | ORAL_TABLET | Freq: Every evening | ORAL | Status: DC | PRN

## 2016-09-23 MED ORDER — OXYCODONE-ACETAMINOPHEN 5-325 MG PO TABS
1.0000 | ORAL_TABLET | ORAL | Status: DC | PRN
  Administered 2016-09-23 – 2016-09-24 (×4): 2 via ORAL
  Filled 2016-09-23 (×4): qty 2

## 2016-09-23 MED ORDER — SUGAMMADEX SODIUM 200 MG/2ML IV SOLN
INTRAVENOUS | Status: AC
  Filled 2016-09-23: qty 2

## 2016-09-23 MED ORDER — DOCUSATE SODIUM 100 MG PO CAPS
100.0000 mg | ORAL_CAPSULE | Freq: Two times a day (BID) | ORAL | Status: DC
  Administered 2016-09-23 – 2016-09-24 (×2): 100 mg via ORAL
  Filled 2016-09-23 (×2): qty 1

## 2016-09-23 MED ORDER — PHENOL 1.4 % MT LIQD: 1.0000 | OROMUCOSAL | Status: DC | PRN

## 2016-09-23 MED ORDER — PAROXETINE HCL 20 MG PO TABS
10.0000 mg | ORAL_TABLET | Freq: Every evening | ORAL | Status: DC
  Administered 2016-09-23: 10 mg via ORAL
  Filled 2016-09-23: qty 1

## 2016-09-23 MED ORDER — ONDANSETRON HCL 4 MG/2ML IJ SOLN: 4.0000 mg | Freq: Once | INTRAMUSCULAR | Status: DC | PRN

## 2016-09-23 MED ORDER — ALBUMIN HUMAN 5 % IV SOLN
INTRAVENOUS | Status: DC | PRN
  Administered 2016-09-23 (×3): via INTRAVENOUS

## 2016-09-23 MED ORDER — ZOLPIDEM TARTRATE 5 MG PO TABS: 5.0000 mg | ORAL_TABLET | Freq: Every evening | ORAL | Status: DC | PRN

## 2016-09-23 MED ORDER — POTASSIUM CHLORIDE IN NACL 20-0.9 MEQ/L-% IV SOLN: INTRAVENOUS | Status: DC

## 2016-09-23 MED ORDER — THROMBIN 20000 UNITS EX SOLR
CUTANEOUS | Status: AC
  Filled 2016-09-23: qty 20000

## 2016-09-23 MED ORDER — LEVOCETIRIZINE DIHYDROCHLORIDE 5 MG PO TABS: 5.0000 mg | ORAL_TABLET | Freq: Every day | ORAL | Status: DC | PRN

## 2016-09-23 MED ORDER — 0.9 % SODIUM CHLORIDE (POUR BTL) OPTIME
TOPICAL | Status: DC | PRN
  Administered 2016-09-23 (×3): 1000 mL

## 2016-09-23 MED ORDER — ROCURONIUM BROMIDE 50 MG/5ML IV SOLN
INTRAVENOUS | Status: AC
  Filled 2016-09-23: qty 2

## 2016-09-23 MED ORDER — LISINOPRIL 40 MG PO TABS
40.0000 mg | ORAL_TABLET | Freq: Every day | ORAL | Status: DC
  Administered 2016-09-23 – 2016-09-24 (×2): 40 mg via ORAL
  Filled 2016-09-23 (×2): qty 1

## 2016-09-23 MED ORDER — SODIUM CHLORIDE 0.9 % IJ SOLN
INTRAMUSCULAR | Status: AC
  Filled 2016-09-23: qty 10

## 2016-09-23 MED ORDER — ACETAMINOPHEN 325 MG PO TABS
650.0000 mg | ORAL_TABLET | ORAL | Status: DC | PRN
  Administered 2016-09-24 (×2): 650 mg via ORAL
  Filled 2016-09-23 (×2): qty 2

## 2016-09-23 MED ORDER — PHENYLEPHRINE 40 MCG/ML (10ML) SYRINGE FOR IV PUSH (FOR BLOOD PRESSURE SUPPORT)
PREFILLED_SYRINGE | INTRAVENOUS | Status: AC
  Filled 2016-09-23: qty 10

## 2016-09-23 MED ORDER — BISACODYL 5 MG PO TBEC: 5.0000 mg | DELAYED_RELEASE_TABLET | Freq: Every day | ORAL | Status: DC | PRN

## 2016-09-23 MED ORDER — SUGAMMADEX SODIUM 200 MG/2ML IV SOLN
INTRAVENOUS | Status: DC | PRN
  Administered 2016-09-23: 200 mg via INTRAVENOUS

## 2016-09-23 MED ORDER — SODIUM CHLORIDE 0.9% FLUSH
3.0000 mL | Freq: Two times a day (BID) | INTRAVENOUS | Status: DC
  Administered 2016-09-24: 3 mL via INTRAVENOUS

## 2016-09-23 MED ORDER — HEMOSTATIC AGENTS (NO CHARGE) OPTIME
TOPICAL | Status: DC | PRN
  Administered 2016-09-23: 1 via TOPICAL

## 2016-09-23 MED ORDER — EPINEPHRINE PF 1 MG/ML IJ SOLN
INTRAMUSCULAR | Status: AC
  Filled 2016-09-23: qty 1

## 2016-09-23 MED ORDER — DIAZEPAM 5 MG PO TABS
5.0000 mg | ORAL_TABLET | Freq: Four times a day (QID) | ORAL | Status: DC | PRN
  Administered 2016-09-23 – 2016-09-24 (×2): 5 mg via ORAL
  Filled 2016-09-23: qty 1

## 2016-09-23 MED ORDER — CEFAZOLIN SODIUM-DEXTROSE 2-4 GM/100ML-% IV SOLN
2.0000 g | INTRAVENOUS | Status: AC
  Administered 2016-09-23: 2 g via INTRAVENOUS
  Filled 2016-09-23: qty 100

## 2016-09-23 MED ORDER — MIDAZOLAM HCL 2 MG/2ML IJ SOLN
INTRAMUSCULAR | Status: AC
  Filled 2016-09-23: qty 2

## 2016-09-23 MED ORDER — PROPOFOL 10 MG/ML IV BOLUS
INTRAVENOUS | Status: DC | PRN
  Administered 2016-09-23: 50 mg via INTRAVENOUS
  Administered 2016-09-23: 200 mg via INTRAVENOUS

## 2016-09-23 MED ORDER — SODIUM CHLORIDE 0.9% FLUSH: 3.0000 mL | INTRAVENOUS | Status: DC | PRN

## 2016-09-23 MED ORDER — EPHEDRINE SULFATE 50 MG/ML IJ SOLN
INTRAMUSCULAR | Status: DC | PRN
  Administered 2016-09-23: 10 mg via INTRAVENOUS
  Administered 2016-09-23: 25 mg via INTRAVENOUS
  Administered 2016-09-23: 10 mg via INTRAVENOUS
  Administered 2016-09-23: 5 mg via INTRAVENOUS

## 2016-09-23 MED ORDER — MORPHINE SULFATE (PF) 4 MG/ML IV SOLN
1.0000 mg | INTRAVENOUS | Status: DC | PRN
  Administered 2016-09-23: 2 mg via INTRAVENOUS
  Filled 2016-09-23 (×2): qty 1

## 2016-09-23 MED ORDER — LACTATED RINGERS IV SOLN
INTRAVENOUS | Status: DC | PRN
  Administered 2016-09-23 (×2): via INTRAVENOUS

## 2016-09-23 MED ORDER — DEXAMETHASONE SODIUM PHOSPHATE 10 MG/ML IJ SOLN
INTRAMUSCULAR | Status: DC | PRN
  Administered 2016-09-23: 10 mg via INTRAVENOUS

## 2016-09-23 MED ORDER — EPHEDRINE 5 MG/ML INJ
INTRAVENOUS | Status: AC
  Filled 2016-09-23: qty 10

## 2016-09-23 MED ORDER — SUCCINYLCHOLINE CHLORIDE 200 MG/10ML IV SOSY
PREFILLED_SYRINGE | INTRAVENOUS | Status: AC
  Filled 2016-09-23: qty 10

## 2016-09-23 MED ORDER — ALUM & MAG HYDROXIDE-SIMETH 200-200-20 MG/5ML PO SUSP: 30.0000 mL | Freq: Four times a day (QID) | ORAL | Status: DC | PRN

## 2016-09-23 MED ORDER — ONDANSETRON HCL 4 MG PO TABS: 4.0000 mg | ORAL_TABLET | Freq: Four times a day (QID) | ORAL | Status: DC | PRN

## 2016-09-23 MED ORDER — MORPHINE SULFATE (PF) 2 MG/ML IV SOLN: 1.0000 mg | INTRAVENOUS | Status: DC | PRN

## 2016-09-23 MED ORDER — BUPIVACAINE-EPINEPHRINE 0.25% -1:200000 IJ SOLN
INTRAMUSCULAR | Status: DC | PRN
  Administered 2016-09-23: 9 mL
  Administered 2016-09-23: 20 mL

## 2016-09-23 MED ORDER — PHENYLEPHRINE HCL 10 MG/ML IJ SOLN
INTRAVENOUS | Status: DC | PRN
  Administered 2016-09-23: 15 ug/min via INTRAVENOUS

## 2016-09-23 MED ORDER — FLUTICASONE PROPIONATE 50 MCG/ACT NA SUSP
1.0000 | Freq: Every day | NASAL | Status: DC | PRN
  Filled 2016-09-23: qty 16

## 2016-09-23 MED ORDER — CEFAZOLIN SODIUM-DEXTROSE 2-3 GM-% IV SOLR
INTRAVENOUS | Status: DC | PRN
  Administered 2016-09-23: 2 g via INTRAVENOUS

## 2016-09-23 MED ORDER — FLEET ENEMA 7-19 GM/118ML RE ENEM: 1.0000 | ENEMA | Freq: Once | RECTAL | Status: DC | PRN

## 2016-09-23 MED ORDER — CEFAZOLIN SODIUM 1 G IJ SOLR
INTRAMUSCULAR | Status: AC
  Filled 2016-09-23: qty 20

## 2016-09-23 MED ORDER — DIAZEPAM 5 MG PO TABS
ORAL_TABLET | ORAL | Status: AC
  Filled 2016-09-23: qty 1

## 2016-09-23 MED ORDER — PANTOPRAZOLE SODIUM 40 MG IV SOLR
40.0000 mg | Freq: Every day | INTRAVENOUS | Status: DC
  Administered 2016-09-23: 40 mg via INTRAVENOUS
  Filled 2016-09-23: qty 40

## 2016-09-23 MED ORDER — ONDANSETRON HCL 4 MG/2ML IJ SOLN
INTRAMUSCULAR | Status: AC
  Filled 2016-09-23: qty 2

## 2016-09-23 MED ORDER — FENTANYL CITRATE (PF) 100 MCG/2ML IJ SOLN
25.0000 ug | INTRAMUSCULAR | Status: DC | PRN
  Administered 2016-09-23 (×3): 50 ug via INTRAVENOUS

## 2016-09-23 MED ORDER — CEFAZOLIN SODIUM-DEXTROSE 2-4 GM/100ML-% IV SOLN
2.0000 g | Freq: Three times a day (TID) | INTRAVENOUS | Status: AC
  Administered 2016-09-24 (×2): 2 g via INTRAVENOUS
  Filled 2016-09-23 (×2): qty 100

## 2016-09-23 MED ORDER — PHENYLEPHRINE HCL 10 MG/ML IJ SOLN
INTRAMUSCULAR | Status: AC
  Filled 2016-09-23: qty 1

## 2016-09-23 MED ORDER — SODIUM CHLORIDE 0.9 % IV SOLN
INTRAVENOUS | Status: DC | PRN
  Administered 2016-09-23: 17:00:00 via INTRAVENOUS

## 2016-09-23 MED ORDER — BUPIVACAINE HCL (PF) 0.25 % IJ SOLN
INTRAMUSCULAR | Status: AC
  Filled 2016-09-23: qty 30

## 2016-09-23 MED ORDER — THROMBIN 20000 UNITS EX SOLR
CUTANEOUS | Status: DC | PRN
  Administered 2016-09-23: 20 mL via TOPICAL

## 2016-09-23 MED ORDER — BUPIVACAINE LIPOSOME 1.3 % IJ SUSP
20.0000 mL | Freq: Once | INTRAMUSCULAR | Status: DC
  Filled 2016-09-23: qty 20

## 2016-09-23 MED ORDER — SUCCINYLCHOLINE CHLORIDE 20 MG/ML IJ SOLN
INTRAMUSCULAR | Status: DC | PRN
  Administered 2016-09-23: 140 mg via INTRAVENOUS

## 2016-09-23 MED ORDER — BACITRACIN ZINC 500 UNIT/GM EX OINT
TOPICAL_OINTMENT | CUTANEOUS | Status: AC
  Filled 2016-09-23: qty 28.35

## 2016-09-23 MED ORDER — POTASSIUM CHLORIDE CRYS ER 10 MEQ PO TBCR
10.0000 meq | EXTENDED_RELEASE_TABLET | Freq: Every day | ORAL | Status: DC
Start: 2016-09-23 — End: 2016-09-24
  Administered 2016-09-23 – 2016-09-24 (×2): 10 meq via ORAL
  Filled 2016-09-23 (×2): qty 1

## 2016-09-23 MED ORDER — FENTANYL CITRATE (PF) 100 MCG/2ML IJ SOLN
INTRAMUSCULAR | Status: DC | PRN
  Administered 2016-09-23 (×8): 50 ug via INTRAVENOUS

## 2016-09-23 SURGICAL SUPPLY — 83 items
BENZOIN TINCTURE PRP APPL 2/3 (GAUZE/BANDAGES/DRESSINGS) ×6 IMPLANT
BIT DRILL TWIST 2.4 FIXED (DRILL) ×1 IMPLANT
BLADE CLIPPER SURG NEURO (BLADE) IMPLANT
BONE VIVIGEN FORMABLE 5.4CC (Bone Implant) ×3 IMPLANT
BUR NEURO DRILL SOFT 3.0X3.8M (BURR) ×3 IMPLANT
BUR PRESCISION 1.7 ELITE (BURR) ×3 IMPLANT
CLOSURE WOUND 1/2 X4 (GAUZE/BANDAGES/DRESSINGS) ×2
CONT SPEC 4OZ CLIKSEAL STRL BL (MISCELLANEOUS) IMPLANT
CORDS BIPOLAR (ELECTRODE) ×3 IMPLANT
COVER BACK TABLE 80X110 HD (DRAPES) ×3 IMPLANT
COVER SURGICAL LIGHT HANDLE (MISCELLANEOUS) ×3 IMPLANT
DRAIN CHANNEL 10F 3/8 F FF (DRAIN) IMPLANT
DRAIN CHANNEL 15F RND FF W/TCR (WOUND CARE) ×3 IMPLANT
DRAIN HEMOVAC 1/8 X 5 (WOUND CARE) IMPLANT
DRAPE C-ARM 42X72 X-RAY (DRAPES) ×3 IMPLANT
DRAPE HALF SHEET 40X57 (DRAPES) ×15 IMPLANT
DRAPE INCISE IOBAN 66X45 STRL (DRAPES) ×3 IMPLANT
DRAPE PED LAPAROTOMY (DRAPES) ×3 IMPLANT
DRAPE POUCH INSTRU U-SHP 10X18 (DRAPES) ×6 IMPLANT
DRAPE SURG 17X23 STRL (DRAPES) ×24 IMPLANT
DRAPE UNIVERSAL PACK (DRAPES) ×3 IMPLANT
DRILL 2.4MM (DRILL) ×3
DRSG MEPILEX BORDER 4X8 (GAUZE/BANDAGES/DRESSINGS) ×3 IMPLANT
DURAPREP 26ML APPLICATOR (WOUND CARE) ×3 IMPLANT
ELECT BLADE 4.0 EZ CLEAN MEGAD (MISCELLANEOUS) ×3
ELECT CAUTERY BLADE 6.4 (BLADE) ×3 IMPLANT
ELECT REM PT RETURN 9FT ADLT (ELECTROSURGICAL) ×3
ELECTRODE BLDE 4.0 EZ CLN MEGD (MISCELLANEOUS) ×1 IMPLANT
ELECTRODE REM PT RTRN 9FT ADLT (ELECTROSURGICAL) ×1 IMPLANT
EVACUATOR SILICONE 100CC (DRAIN) ×3 IMPLANT
GAUZE SPONGE 4X4 12PLY STRL (GAUZE/BANDAGES/DRESSINGS) ×3 IMPLANT
GAUZE SPONGE 4X4 16PLY XRAY LF (GAUZE/BANDAGES/DRESSINGS) ×6 IMPLANT
GLOVE BIO SURGEON STRL SZ7 (GLOVE) ×3 IMPLANT
GLOVE BIO SURGEON STRL SZ8 (GLOVE) ×3 IMPLANT
GLOVE BIOGEL PI IND STRL 7.5 (GLOVE) ×1 IMPLANT
GLOVE BIOGEL PI IND STRL 8 (GLOVE) ×1 IMPLANT
GLOVE BIOGEL PI INDICATOR 7.5 (GLOVE) ×2
GLOVE BIOGEL PI INDICATOR 8 (GLOVE) ×2
GLOVE SURG ORTHO 7.0 STRL STRW (GLOVE) ×3 IMPLANT
GLOVE SURG SS PI 6.5 STRL IVOR (GLOVE) ×9 IMPLANT
GOWN STRL REUS W/ TWL LRG LVL3 (GOWN DISPOSABLE) ×4 IMPLANT
GOWN STRL REUS W/ TWL XL LVL3 (GOWN DISPOSABLE) ×1 IMPLANT
GOWN STRL REUS W/TWL LRG LVL3 (GOWN DISPOSABLE) ×8
GOWN STRL REUS W/TWL XL LVL3 (GOWN DISPOSABLE) ×2
IV CATH 14GX2 1/4 (CATHETERS) ×3 IMPLANT
KIT BASIN OR (CUSTOM PROCEDURE TRAY) ×3 IMPLANT
KIT ROOM TURNOVER OR (KITS) ×3 IMPLANT
NEEDLE HYPO 25GX1X1/2 BEV (NEEDLE) ×3 IMPLANT
NEEDLE PRECISIONGLIDE 27X1.5 (NEEDLE) ×3 IMPLANT
NS IRRIG 1000ML POUR BTL (IV SOLUTION) ×3 IMPLANT
PACK LAMINECTOMY ORTHO (CUSTOM PROCEDURE TRAY) ×3 IMPLANT
PAD ARMBOARD 7.5X6 YLW CONV (MISCELLANEOUS) ×6 IMPLANT
PATTIES SURGICAL .5 X.5 (GAUZE/BANDAGES/DRESSINGS) ×6 IMPLANT
PATTIES SURGICAL .5 X3 (DISPOSABLE) IMPLANT
PATTIES SURGICAL .5X1.5 (GAUZE/BANDAGES/DRESSINGS) IMPLANT
PIN MAYFIELD SKULL DISP (PIN) ×3 IMPLANT
PUTTY DBX 1CC (Putty) ×3 IMPLANT
PUTTY DBX 1CC DEPUY (Putty) ×1 IMPLANT
ROD LATERAL MOUNTAINEER OC (Rod) ×3 IMPLANT
ROD TEMPLATE MOUNTAINEER 120MM (ROD) ×3 IMPLANT
SCREW F A 3.5X12 (Screw) ×9 IMPLANT
SCREW FA MOUNTAINEER 22MM (Screw) ×3 IMPLANT
SCREW INNER (Screw) ×12 IMPLANT
SPONGE INTESTINAL PEANUT (DISPOSABLE) ×3 IMPLANT
SPONGE LAP 4X18 X RAY DECT (DISPOSABLE) ×3 IMPLANT
SPONGE SURGIFOAM ABS GEL 100 (HEMOSTASIS) ×3 IMPLANT
STRIP CLOSURE SKIN 1/2X4 (GAUZE/BANDAGES/DRESSINGS) ×4 IMPLANT
SURGIFLO W/THROMBIN 8M KIT (HEMOSTASIS) ×3 IMPLANT
SUT ETHILON 3 0 PS 1 (SUTURE) ×3 IMPLANT
SUT MNCRL AB 4-0 PS2 18 (SUTURE) ×3 IMPLANT
SUT VIC AB 0 CT1 18XCR BRD 8 (SUTURE) ×1 IMPLANT
SUT VIC AB 0 CT1 8-18 (SUTURE) ×2
SUT VIC AB 1 CT1 18XCR BRD 8 (SUTURE) ×2 IMPLANT
SUT VIC AB 1 CT1 8-18 (SUTURE) ×4
SUT VIC AB 2-0 CT2 18 VCP726D (SUTURE) ×3 IMPLANT
SYR BULB IRRIGATION 50ML (SYRINGE) ×3 IMPLANT
SYR CONTROL 10ML LL (SYRINGE) ×3 IMPLANT
TAPE CLOTH 4X10 WHT NS (GAUZE/BANDAGES/DRESSINGS) ×3 IMPLANT
TOWEL OR 17X24 6PK STRL BLUE (TOWEL DISPOSABLE) ×3 IMPLANT
TOWEL OR 17X26 10 PK STRL BLUE (TOWEL DISPOSABLE) ×3 IMPLANT
TRAY FOLEY W/METER SILVER 16FR (SET/KITS/TRAYS/PACK) ×3 IMPLANT
WATER STERILE IRR 1000ML POUR (IV SOLUTION) ×3 IMPLANT
YANKAUER SUCT BULB TIP NO VENT (SUCTIONS) ×6 IMPLANT

## 2016-09-23 NOTE — Anesthesia Preprocedure Evaluation (Signed)
Anesthesia Evaluation  Patient identified by MRN, date of birth, ID band Patient awake    Reviewed: Allergy & Precautions, NPO status , Patient's Chart, lab work & pertinent test results  Airway Mallampati: II  TM Distance: >3 FB Neck ROM: Limited    Dental  (+) Teeth Intact, Dental Advisory Given   Pulmonary former smoker,    Pulmonary exam normal        Cardiovascular hypertension, Pt. on medications and Pt. on home beta blockers Normal cardiovascular exam     Neuro/Psych Anxiety CVA    GI/Hepatic Neg liver ROS, GERD  ,  Endo/Other  negative endocrine ROS  Renal/GU negative Renal ROS     Musculoskeletal  (+) Arthritis , Osteoarthritis,    Abdominal   Peds  Hematology negative hematology ROS (+)   Anesthesia Other Findings . DDD (degenerative disc disease)  . GERD (gastroesophageal reflux disease)  . History of hiatal hernia  . Hypertension  . Osteoarthritis  . Panic attacks  . Stroke [facility])   TIA 2 years ago  on plavix . TMJ (dislocation of temporomandibular joint)    Reproductive/Obstetrics                             Lab Results  Component Value Date   WBC 4.8 09/18/2016   HGB 12.9 09/18/2016   HCT 39.3 09/18/2016   MCV 94.2 09/18/2016   PLT 198 09/18/2016   Lab Results  Component Value Date   CREATININE 0.57 09/18/2016   BUN 11 09/18/2016   NA 139 09/18/2016   K 3.4 (L) 09/18/2016   CL 103 09/18/2016   CO2 29 09/18/2016    Anesthesia Physical  Anesthesia Plan  ASA: III  Anesthesia Plan: General   Post-op Pain Management:    Induction: Intravenous  PONV Risk Score and Plan: 2 and Ondansetron, Dexamethasone, Propofol and Treatment may vary due to age or medical condition  Airway Management Planned: Oral ETT  Additional Equipment:   Intra-op Plan:   Post-operative Plan: Extubation in OR  Informed Consent: I have reviewed the patients History  and Physical, chart, labs and discussed the procedure including the risks, benefits and alternatives for the proposed anesthesia with the patient or authorized representative who has indicated his/her understanding and acceptance.   Dental advisory given  Plan Discussed with:   Anesthesia Plan Comments:         Anesthesia Quick Evaluation

## 2016-09-23 NOTE — Transfer of Care (Signed)
Immediate Anesthesia Transfer of Care Note  Patient: Breanna [name]  Procedure(s) Performed: Procedure(s) with comments: POSTERIOR CERVICAL DECOMPRESSION FUSION, CERVICAL 3-4, CERVICAL 4-5, CERVICAL 5-6, CERVICAL 6-7 WITH INSTRUMENTATION AND ALLOGRAFT (Right) - POSTERIOR CERVICAL DECOMPRESSION FUSION, CERVICAL 3-4, CERVICAL 4-5, CERVICAL 5-6, CERVICAL 6-7 WITH INSTRUMENTATION AND ALLOGRAFT; REQUEST 4.5 HOURS AND FLIP ROOM  Patient Location: PACU  Anesthesia Type:General  Level of Consciousness: alert , oriented, drowsy and patient cooperative  Airway & Oxygen Therapy: Patient Spontanous Breathing and Patient connected to face mask oxygen  Post-op Assessment: Report given to RN and Post -op Vital signs reviewed and stable  Post vital signs: Reviewed and stable  Last Vitals:  Vitals:   09/23/16 0853  BP: (!) 150/67  Pulse: (!) 59  Resp: 16  Temp: 36.6 C    Last Pain:  Vitals:   09/23/16 0853  TempSrc: Oral  PainSc: 3       Patients Stated Pain Goal: 3 (01/06/12 1438)  Complications: No apparent anesthesia complications

## 2016-09-23 NOTE — Anesthesia Postprocedure Evaluation (Signed)
Anesthesia Post Note  Patient: Breanna [name]  Procedure(s) Performed: Procedure(s) (LRB): POSTERIOR CERVICAL DECOMPRESSION FUSION, CERVICAL 3-4, CERVICAL 4-5, CERVICAL 5-6, CERVICAL 6-7 WITH INSTRUMENTATION AND ALLOGRAFT (Right)     Patient location during evaluation: PACU Anesthesia Type: General Level of consciousness: awake and alert Pain management: pain level controlled Vital Signs Assessment: post-procedure vital signs reviewed and stable Respiratory status: spontaneous breathing, nonlabored ventilation, respiratory function stable and patient connected to nasal cannula oxygen Cardiovascular status: blood pressure returned to baseline and stable Postop Assessment: no signs of nausea or vomiting Anesthetic complications: no    Last Vitals:  Vitals:   09/23/16 1855 09/23/16 1910  BP: (!) 157/82 (!) 153/67  Pulse: 90 85  Resp: 16 12  Temp:      Last Pain:  Vitals:   09/23/16 1907  TempSrc:   PainSc: 8                  Mildreth Reek

## 2016-09-23 NOTE — Anesthesia Procedure Notes (Signed)
Procedure Name: Intubation Date/Time: 09/23/2016 12:03 PM Performed by: Scheryl Darter Pre-anesthesia Checklist: Patient identified, Emergency Drugs available, Suction available and Patient being monitored Patient Re-evaluated:Patient Re-evaluated prior to induction Oxygen Delivery Method: Circle System Utilized Preoxygenation: Pre-oxygenation with 100% oxygen Induction Type: IV induction Ventilation: Mask ventilation without difficulty Laryngoscope Size: Glidescope and 3 Grade View: Grade III Tube type: Oral Tube size: 7.5 mm Number of attempts: 1 Airway Equipment and Method: Oral airway,  Rigid stylet and Video-laryngoscopy Placement Confirmation: ETT inserted through vocal cords under direct vision,  positive ETCO2 and breath sounds checked- equal and bilateral Secured at: 21 cm Tube secured with: Tape Dental Injury: Teeth and Oropharynx as per pre-operative assessment  Difficulty Due To: Difficulty was anticipated, Difficult Airway- due to reduced neck mobility and Difficult Airway- due to limited oral opening Future Recommendations: Recommend- induction with short-acting agent, and alternative techniques readily available

## 2016-09-24 MED ORDER — PANTOPRAZOLE SODIUM 40 MG PO TBEC: 40.0000 mg | DELAYED_RELEASE_TABLET | Freq: Every day | ORAL | Status: DC

## 2016-09-24 MED FILL — Sodium Chloride IV Soln 0.9%: INTRAVENOUS | Qty: 1000 | Status: AC

## 2016-09-24 MED FILL — Heparin Sodium (Porcine) Inj 1000 Unit/ML: INTRAMUSCULAR | Qty: 30 | Status: AC

## 2016-09-24 NOTE — Progress Notes (Signed)
PT Cancellation Note  Patient Details Name: Breanna [name] MRN: [id number] DOB: Dec 26, 1945   Cancelled Treatment:    Reason Eval/Treat Not Completed: PT screened, no needs identified, will sign off (pt seen by OT and referred as no PT needs, will sign off)   Breanna [name] 09/24/2016, 11:37 AM Elwyn Reach, [place]

## 2016-09-24 NOTE — Discharge Instructions (Signed)
You were sent home with written prescriptions for Percocet and Valium. Take the Valium as needed for muscle spasms and the Percocet as needed for pain.

## 2016-09-24 NOTE — Op Note (Signed)
NAME:  Breanna, [name] NO.:  MEDICAL RECORD NO.:  [id number]  PHYSICIAN:  Phylliss Bob, MD      DATE OF BIRTH:  September 25, 1945  DATE OF PROCEDURE:  09/23/2016                               OPERATIVE REPORT   PREOPERATIVE DIAGNOSES: 1. Right-sided cervical radiculopathy. 2. Profound right shoulder weakness. 3. Status post previous C3-C7 ACDF by another provider, with     development of subsequent right arm pain and weakness. 4. Severe right-sided neural foraminal stenosis C3-4, C4-5, C5-6, C6-     7.  POSTOPERATIVE DIAGNOSES: 1. Right-sided cervical radiculopathy. 2. Profound right shoulder weakness. 3. Status post previous C3-C7 ACDF by another provider, with     development of subsequent right arm pain and weakness. 4. Severe right-sided neural foraminal stenosis C3-4, C4-5, C5-6, and     C6-7.  PROCEDURE: 1. Posterior spinal fusion at C3-4, C4-5, C5-6, and C6-7. 2. Placement of posterior segmental instrumentation, C3-C7. 3. Bilateral laminotomy with partial facetectomy and foraminotomy, C6-     7. 4. Right-sided laminotomy with partial facetectomy and foraminotomy,     C3-4, C4-5, C5-6. 5. Use of morselized allograft - ViviGen. 6. Cranial tong application and removal. 7. Intraoperative use of fluoroscopy.  SURGEON:  Phylliss Bob, MD.  ASSISTANTPricilla Holm, PA-C.  ANESTHESIA:  General endotracheal anesthesia.  COMPLICATIONS:  None.  DISPOSITION:  Stable.  ESTIMATED BLOOD LOSS:  300 mL.  INDICATIONS FOR SURGERY:  Briefly, Ms. Breanna [name] is a very pleasant 71- year-old female, who did present to me with severe pain in her right arm as well as substantial weakness in her right shoulder.  Of note, the patient was status post a C3-C7 ACDF by another provider.  Per the patient, who immediately after the surgery, she noted profound weakness in her right shoulder, as well as severe pain extending down her right arm into her right hand.  I did  review a postoperative MRI, which did reveal severe neural foraminal stenosis on the right at C3-4, C4-5, C5- 6, and C6-7.  I did also obtain an EMG and nerve conduction study, which was notable for right-sided cervical radiculopathy at C5, C6, and C7. Of note, EMG and nerve conduction studies are not diagnostic for C4 radiculopathy, and I did also feel that C4 radiculopathy was additionally present, particularly given her profound shoulder weakness. Given her ongoing symptoms, we did discuss proceeding with a posterior procedure.  Specifically, we did discuss proceeding with a posterior spinal fusion from C3-C7, in addition to a bilateral foraminotomy at C6- 7 and a right-sided foraminotomy at C3-4, C4-5, and C5-6.  The patient did elect to proceed with surgery.  OPERATIVE DETAILS:  On September 23, 2016, the patient was brought to surgery and general endotracheal anesthesia was administered.  Cranial tongs were applied.  The patient was rolled prone onto a hospital bed.  The patient's head was secured into a [place] head holder.  The patient's arms were secured to her sides and all bony prominences were meticulously padded.  The neck was then prepped and draped in the usual sterile fashion.  A midline incision was then made from approximately C3- C7.  The fascia was incised at the midline.  The posterior elements were subperiosteally exposed from C3-C7.  Using  anatomic landmarks, I did cannulate the C3, C4, and C5 lateral masses on the left side.  I did use a 3 mm tap as well.  Bone wax was placed in the cannulated lateral masses.  I then performed a laminotomy and partial facetectomy and foraminotomy on the left side at C6-7.  This did decompress the left C7 nerve, and did allow me the ability to palpate the pedicle on the left side at C7.  I then cannulated the left C7 pedicle using a high-speed bur, followed by a gearshift probe.  A 3.5 mm tap was used.  Bone wax was then placed in the  cannulated pedicle hole.  I then proceeded with the right-sided decompression.  At the C3-4, C4-5, C5-6, and C6-7 levels, I did perform a partial facetectomy and foraminotomy.  I was able to visualize the exiting nerves and I did follow the nerves out to the lateral aspect of the corresponding pedicles.  Very severe neuroforaminal stenosis was noted at each level.  I was very pleased with the decompression that I was able to accomplish at each level.  At the termination of each decompression, I was easily able to pass a nerve hook out the neural foramen and passed the pedicles.  Bleeding was controlled using Surgiflo, in addition to Floseal.  At this point, high- speed bur was used to decorticate the posterior elements on the left side at C3, C4, C5, C6, and C7.  The 12 x 3 mm screws were placed into the lateral masses on the left at C3, C4, and C5.  A 22 x 3.5 mm screw was placed into the pedicle of C7.  The rod was contoured into the appropriate position and secured into the tulip heads of the screws. Caps were then placed and a final locking procedure was performed.  DBX putty was then packed into the facet joints of each level to be fused. ViviGen was also packed into the posterior elements on the left side from C3-C7.  Of note, prior to placing the bone graft, I did liberally irrigate the wound with a total of approximately 2 L of normal saline to minimize the risk of infection.  I was very pleased with the final AP and lateral fluoroscopic images.  At this point, the wound was closed in layers.  The fascia was closed using #1 Vicryl.  The subcutaneous layer was closed using 0 Vicryl, followed by 2-0 Vicryl.  The skin was closed using 4-0 Monocryl.  Of note, a #15 deep Blake drain was placed deep to the fascia prior to closure.  At this point, the patient was awoken from general endotracheal anesthesia.  The [place] head holder was removed prior to the patient awakening.  The patient  was then transferred to recovery in stable condition.  Of note, Pricilla Holm, PA-C, was my assistant throughout surgery, and did aid in retraction, suctioning, and closure from start to finish.    Phylliss Bob, MD    MD/MEDQ  D:  09/23/2016  T:  09/23/2016  Job:  [id number]

## 2016-09-24 NOTE — Progress Notes (Signed)
    Patient doing well + expected neck discomfort Patient reports resolution of R arm pain Notes improvement in R shoulder strength   Physical Exam: Vitals:   09/24/16 0239 09/24/16 0506  BP: (!) 149/71 137/66  Pulse:  97  Resp: 16 16  Temp: (!) 97.4 F (36.3 C) 97.9 F (36.6 C)    Dressing in place NVI + R shoulder abduction at 3/5 - improved vs preop  Drain: 100cc/10 hours  POD #1 s/p C3-7 posterior decompression and fusion  - up with PT/OT, encourage ambulation - Percocet for pain, Valium for muscle spasms - likely d/c home later today - drain may be d/c'ed

## 2016-09-24 NOTE — Evaluation (Signed)
Occupational Therapy Evaluation and Discharge Patient Details Name: Breanna [name] MRN: [id number] DOB: 04-09-1945 Today's Date: 09/24/2016    History of Present Illness Pt is a 71 y/o female s/p C3-7 posterior decompression and fusion. 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   Clinical Impression   PTA Pt has been modified independent since surgery in March, her RUE has not been functioning properly. She learned to do everything with her LUE. Pt is currently min A for ADL and min guard (close to supervision level) for ambulation with RW. Cervical handout provided and reviewed adls in detail. Pt educated on: brace, set an alarm at night for medication, avoid sitting for long periods of time, correct bed positioning for sleeping, correct sequence for bed mobility, avoiding lifting more than 5 pounds and never wash directly over incision. All education is complete and patient/husband indicates understanding. OT to sign off at this time, thank you for this referral.       Follow Up Recommendations  No OT follow up;DC plan and follow up therapy as arranged by surgeon;Supervision/Assistance - 24 hour (initially)    Equipment Recommendations  3 in 1 bedside commode    Recommendations for Other Services       Precautions / Restrictions Precautions Precautions: Cervical Precaution Comments: Handout reviewed in full Required Braces or Orthoses: Cervical Brace Cervical Brace: Hard collar;At all times (also [place] collar for showering) Restrictions Weight Bearing Restrictions: No Other Position/Activity Restrictions: Do not lift arm above shoulder height (hand to mouth ok) per Pt report from MD      Mobility Bed Mobility Overal bed mobility: Needs Assistance Bed Mobility: Rolling;Sidelying to  Sit Rolling: Min guard Sidelying to sit: Min assist       General bed mobility comments: min A for trunk elevation, vc for technique to maintain precautions  Transfers Overall transfer level: Needs assistance Equipment used: Rolling walker (2 wheeled) Transfers: Sit to/from Stand Sit to Stand: Min guard         General transfer comment: min guard from bed, and toilet (use of grab bars)    Balance Overall balance assessment: Needs assistance Sitting-balance support: No upper extremity supported;Feet supported Sitting balance-Leahy Scale: Good Sitting balance - Comments: sitting EOB   Standing balance support: Bilateral upper extremity supported;During functional activity Standing balance-Leahy Scale: Fair Standing balance comment: prefers to use RW, but able to static stand for grooming at sink                           ADL either performed or assessed with clinical judgement   ADL Overall ADL's : Needs assistance/impaired Eating/Feeding: Set up;Sitting   Grooming: Wash/dry hands;Wash/dry face;Standing;Min guard Grooming Details (indicate cue type and reason): sink level - educated in compensatory strategies for ADL Upper Body Bathing: With caregiver independent assisting;Sitting;Moderate assistance   Lower Body Bathing: Moderate assistance;With caregiver independent assisting;Sit to/from stand Lower Body Bathing Details (indicate cue type and reason): Pt familiar with long handle sponge Upper Body Dressing : Moderate assistance;With caregiver independent assisting;Sitting Upper Body Dressing Details (indicate cue type and reason): to don brace, set up for clothing Lower Body Dressing: Moderate assistance;With caregiver independent assisting;Sit to/from stand Lower Body Dressing Details (indicate cue type and reason): assist for socks, and initially putting on LB clothing Toilet Transfer: Min guard;Ambulation;Comfort height toilet;Grab bars;RW Armed forces technical officer  Details (indicate cue type and reason): Pt able to use grab bars with LUE to assist with sit to stand Toileting- Water quality scientist and Hygiene: Min guard;Sit to/from stand Toileting - Clothing Manipulation Details (indicate cue type and reason): hospital gown and peri care with warm wash cloth Tub/ Shower Transfer: Walk-in shower;Ambulation;Rolling walker   Functional mobility during ADLs: Min guard;Rolling walker General ADL Comments: Pt edcuated in compensatory strategies for ADL to maintain cervical body mechanics     Vision Baseline Vision/History: Wears glasses Wears Glasses: At all times Patient Visual Report: No change from baseline Vision Assessment?: No apparent visual deficits     Perception     Praxis      Pertinent Vitals/Pain Pain Assessment: 0-10 Pain Score: 6  Pain Location: incision site Pain Descriptors / Indicators: Discomfort;Sore Pain Intervention(s): Monitored during session;Repositioned;RN gave pain meds during session     Hand Dominance Right   Extremity/Trunk Assessment Upper Extremity Assessment Upper Extremity Assessment: RUE deficits/detail RUE Deficits / Details: grossly decreased strength and ROM, sensation intact RUE:  (no over shoulder movement per MD) RUE Sensation:  (WFL) RUE Coordination: decreased gross motor   Lower Extremity Assessment Lower Extremity Assessment: Defer to PT evaluation   Cervical / Trunk Assessment Cervical / Trunk Assessment: Other exceptions Cervical / Trunk Exceptions: s/p cervical surgery   Communication Communication Communication: No difficulties   Cognition Arousal/Alertness: Awake/alert Behavior During Therapy: WFL for tasks assessed/performed Overall Cognitive Status: Within Functional Limits for tasks assessed                                     General Comments  Pt's husband present for session, and is willing/able to assist    Exercises Exercises:  (elbow, wrist, and hand  exercises for edema managemet)   Shoulder Instructions      Home Living Family/patient expects to be discharged to:: Private residence Living Arrangements: Spouse/significant other Available Help at Discharge: Family;Available 24 hours/day Type of Home: House Home Access: Level entry     Home Layout: Two level;Able to live on main level with bedroom/bathroom Alternate Level Stairs-Number of Steps: flight   Bathroom Shower/Tub: Occupational psychologist: Handicapped height Bathroom Accessibility: Yes How Accessible: Accessible via walker Home Equipment: [place] - 2 wheels;Adaptive equipment Adaptive Equipment: Reacher;Sock aid Additional Comments: Pt will have 24 hour assist from family at discharge       Prior Functioning/Environment Level of Independence: Independent        Comments: retired Education officer, environmental; Pt has had limited use of RUE since surgery in March.        OT Problem List: Decreased strength;Decreased range of motion;Impaired balance (sitting and/or standing);Decreased knowledge of precautions;Impaired UE functional use;Pain      OT Treatment/Interventions:      OT Goals(Current goals can be found in the care plan section) Acute Rehab OT Goals Patient Stated Goal: to get home and get RUE back to full strength OT Goal Formulation: With patient/family Time For Goal Achievement: 10/08/16 Potential to Achieve Goals: Good  OT Frequency:     Barriers to D/C:            Co-evaluation              AM-PAC PT "6 Clicks" Daily Activity     Outcome Measure Help from another person eating meals?: A Little Help from another person taking care of personal grooming?: A Little Help  from another person toileting, which includes using toliet, bedpan, or urinal?: A Little Help from another person bathing (including washing, rinsing, drying)?: A Little Help from another person to put on and taking off regular upper body clothing?: A Lot Help from another person to  put on and taking off regular lower body clothing?: A Lot 6 Click Score: 16   End of Session Equipment Utilized During Treatment: Gait belt;Rolling walker;Cervical collar Nurse Communication: Mobility status  Activity Tolerance: Patient tolerated treatment well Patient left: in chair;with call bell/phone within reach;with family/visitor present  OT Visit Diagnosis: Unsteadiness on feet (R26.81);Muscle weakness (generalized) (M62.81);Pain Pain - Right/Left: Right Pain - part of body: Arm (neck)                Time: 6440-3474 OT Time Calculation (min): 34 min Charges:  OT General Charges $OT Visit: 1 Procedure OT Evaluation $OT Eval Moderate Complexity: 1 Procedure OT Treatments $Self Care/Home Management : 8-22 mins G-Codes:     Hulda Humphrey OTR/L [place] 09/24/2016, 11:19 AM

## 2016-09-28 ENCOUNTER — Encounter (HOSPITAL_COMMUNITY): Payer: Self-pay | Admitting: Orthopedic Surgery

## 2016-10-14 NOTE — Discharge Summary (Signed)
Patient ID: Breanna [name] MRN: [id number] DOB/AGE: 70-May-1947 70 y.o.  Admit date: 09/23/2016 Discharge date: 09/24/2016  Admission Diagnoses:  Active Problems:   Radiculopathy   Discharge Diagnoses:  Same  Past Medical History:  Diagnosis Date  . DDD (degenerative disc disease)   . GERD (gastroesophageal reflux disease)   . History of hiatal hernia   . Hypertension   . Osteoarthritis   . Panic attacks   . Stroke [facility])    TIA 2 years ago  on plavix  . TMJ (dislocation of temporomandibular joint)     Surgeries: Procedure(s): POSTERIOR CERVICAL DECOMPRESSION FUSION, CERVICAL 3-4, CERVICAL 4-5, CERVICAL 5-6, CERVICAL 6-7 WITH INSTRUMENTATION AND ALLOGRAFT on 09/23/2016   Consultants: None  Discharged Condition: Improved  Hospital Course: Breanna [name] is an 71 y.o. female who was admitted 09/23/2016 for operative treatment of radiculopathy. Patient has severe unremitting pain that affects sleep, daily activities, and work/hobbies. After pre-op clearance the patient was taken to the operating room on 09/23/2016 and underwent  Procedure(s): POSTERIOR CERVICAL DECOMPRESSION FUSION, CERVICAL 3-4, CERVICAL 4-5, CERVICAL 5-6, CERVICAL 6-7 WITH INSTRUMENTATION AND ALLOGRAFT.    Patient was given perioperative antibiotics:  Anti-infectives    Start     Dose/Rate Route Frequency Ordered Stop   09/23/16 2359  ceFAZolin (ANCEF) IVPB 2g/100 mL premix     2 g 200 mL/hr over 30 Minutes Intravenous Every 8 hours 09/23/16 1957 09/24/16 1055   09/23/16 0836  ceFAZolin (ANCEF) IVPB 2g/100 mL premix     2 g 200 mL/hr over 30 Minutes Intravenous On call to O.R. 09/23/16 0962 09/23/16 1215       Patient was given sequential compression devices, early ambulation to prevent DVT.  Patient benefited maximally from hospital stay and there were no complications.    Recent vital signs: BP (!) 147/48 (BP Location: Left Arm)   Pulse 78   Temp 98.4 F (36.9 C) (Oral)   Resp 16   Ht 5\' 5"   (1.651 m)   Wt 98 kg (216 lb)   SpO2 90%   BMI 35.94 kg/m    Discharge Medications:   Allergies as of 09/24/2016      Reactions   Atorvastatin Other (See Comments)   Myalgia   Lactose Diarrhea   Mometasone Nausea And Vomiting, Other (See Comments)   headache   Nasonex [mometasone Furoate] Other (See Comments)   headache   Neurontin [gabapentin] Other (See Comments)   depression      Medication List    STOP taking these medications   acetaminophen 500 MG tablet Commonly known as:  TYLENOL   morphine 15 MG tablet Commonly known as:  MSIR   traMADol 50 MG tablet Commonly known as:  ULTRAM     TAKE these medications   amLODipine 5 MG tablet Commonly known as:  NORVASC Take 5 mg by mouth daily.   carvedilol 25 MG tablet Commonly known as:  COREG Take 25 mg by mouth 2 (two) times daily with a meal.   clopidogrel 75 MG tablet Commonly known as:  PLAVIX Take 75 mg by mouth daily.   estradiol 1 MG tablet Commonly known as:  ESTRACE Take 1 mg by mouth 4 (four) times a week. Notes to patient:  Patient takes it Sun, Tues, Thurs, Sat   fluticasone 50 MCG/ACT nasal spray Commonly known as:  FLONASE Place 1 spray into both nostrils daily as needed for allergies.   hydrochlorothiazide 25 MG tablet Commonly known as:  HYDRODIURIL Take 25  mg by mouth daily.   levocetirizine 5 MG tablet Commonly known as:  XYZAL Take 5 mg by mouth daily as needed for allergies. Alternates between allegra and xyzal   lisinopril 40 MG tablet Commonly known as:  PRINIVIL,ZESTRIL Take 40 mg by mouth daily.   PARoxetine 10 MG tablet Commonly known as:  PAXIL Take 10 mg by mouth every evening.   potassium chloride 10 MEQ tablet Commonly known as:  K-DUR,KLOR-CON Take 10 mEq by mouth daily.   SYSTANE OP Apply 1 drop to eye daily.   tiZANidine 4 MG tablet Commonly known as:  ZANAFLEX Take 1 tablet (4 mg total) by mouth every 6 (six) hours as needed for muscle spasms.        Diagnostic Studies: Dg Cervical Spine 2-3 Views  Result Date: 09/23/2016 CLINICAL DATA:  Posterior cervical decompression and fusion at the C3-4, C4-5, C5-6 and C6-7 levels. EXAM: CERVICAL SPINE - 2-3 VIEW COMPARISON:  Cervical spine CT dated 08/19/2016. FINDINGS: An initial portable cross-table lateral view of the cervical spine demonstrates metallic tissue separate nurse posteriorly at the C3 through C5 levels. Interbody bone plug and anterior screw and plate fusion is again demonstrated beginning at the C3 level and extending inferiorly. The lower levels are obscured by the patient's shoulders. A second image demonstrates a metallic localizer with its tip overlying the C4 spinous process. IMPRESSION: Localizer at the C4 level. Electronically Signed   By: Claudie Revering M.D.   On: 09/23/2016 14:47   Dg Cervical Spine Complete  Result Date: 09/23/2016 CLINICAL DATA:  Posterior cervical decompression and fusion from C3-C7 on the right. EXAM: DG C-ARM 61-120 MIN; CERVICAL SPINE - COMPLETE 4+ VIEW COMPARISON:  CT 08/19/2016 FINDINGS: Intraoperative fluoroscopy shows placement of posterior rod and screws on the right. There are screws from C3-C7 sparing C6. There is pre-existing ACDF hardware from C3-C7, with solid arthrodesis by CT. No evidence of intraoperative hardware or osseous fracture. IMPRESSION: Fluoroscopy for C3-C7 posterior fusion on the right. Electronically Signed   By: Monte Fantasia M.D.   On: 09/23/2016 17:12   Dg C-arm 1-60 Min  Result Date: 09/23/2016 CLINICAL DATA:  Posterior cervical decompression and fusion from C3-C7 on the right. EXAM: DG C-ARM 61-120 MIN; CERVICAL SPINE - COMPLETE 4+ VIEW COMPARISON:  CT 08/19/2016 FINDINGS: Intraoperative fluoroscopy shows placement of posterior rod and screws on the right. There are screws from C3-C7 sparing C6. There is pre-existing ACDF hardware from C3-C7, with solid arthrodesis by CT. No evidence of intraoperative hardware or osseous  fracture. IMPRESSION: Fluoroscopy for C3-C7 posterior fusion on the right. Electronically Signed   By: Monte Fantasia M.D.   On: 09/23/2016 17:12    Disposition: 01-Home or Self Care  Discharge Instructions    Call MD / Call 911    Complete by:  As directed    If you experience chest pain or shortness of breath, CALL 911 and be transported to the hospital emergency room.  If you develope a fever above 101 F, pus (white drainage) or increased drainage or redness at the wound, or calf pain, call your surgeon's office.   Constipation Prevention    Complete by:  As directed    Drink plenty of fluids.  Prune juice may be helpful.  You may use a stool softener, such as Colace (over the counter) 100 mg twice a day.  Use MiraLax (over the counter) for constipation as needed.   Diet - low sodium heart healthy    Complete by:  As directed    Increase activity slowly as tolerated    Complete by:  As directed      POD #1 s/p C3-7 posterior decompression and fusion  - up with PT/OT, encourage ambulation - Percocet for pain, Valium for muscle spasms - drain may be d/c'ed  -Written scripts for pain signed and in chart -D/C instructions sheet printed and in chart -D/C today  -F/U in office 2 weeks   Signed: Justice Britain 10/14/2016, 11:05 AM

## 2017-02-02 ENCOUNTER — Encounter: Payer: Self-pay | Admitting: Emergency Medicine

## 2017-02-02 ENCOUNTER — Ambulatory Visit (INDEPENDENT_AMBULATORY_CARE_PROVIDER_SITE_OTHER): Payer: Medicare Other | Admitting: Emergency Medicine

## 2017-02-02 VITALS — BP 130/70 | HR 77 | Ht 65.0 in | Wt 207.0 lb

## 2017-02-02 DIAGNOSIS — R0600 Dyspnea, unspecified: Secondary | ICD-10-CM | POA: Diagnosis not present

## 2017-02-02 DIAGNOSIS — R9389 Abnormal findings on diagnostic imaging of other specified body structures: Secondary | ICD-10-CM

## 2017-02-02 NOTE — Assessment & Plan Note (Signed)
With likely COPD based on clinical history, some emphysematous change on CT scan of the chest available for review from [place].  We will perform full pulmonary function testing.  Depending on results we will initiate scheduled bronchodilators.

## 2017-02-02 NOTE — Patient Instructions (Addendum)
We will perform pulmonary function testing at your next office visit We will need to repeat your CT scan of the chest in several months.  We can discuss the timing at your next office visit Stop Atrovent inhaler for now Follow with Dr Lamonte Sakai next available PFT on the same day

## 2017-02-02 NOTE — Progress Notes (Signed)
Subjective:    Patient ID: Breanna [name], female    DOB: [birth year], 71 y.o.   MRN: [id number]  HPI 71 year old former smoker (25 pack years), with hypertension (on ACE inhibitor), hiatal hernia, GERD, allergic rhinitis, prior TIA, DDD with cervical disk sgy done 05/2016.   She is referred for dyspnea. She reports that about 1 month ago developed an increase in some mild baseline dyspnea. Was associated with sinus drainage, some wheeze, so she was treated with pred + abx x 2. Treated for a possible CAP. Seems to notice it when she exerts, goes from sitting to standing - no real change since treated as above. She was therefore sent to ED 11/20, no evidence PE on CT chest, no overt infiltrates,   Scan of the chest that was performed on 01/26/17 at [facility] showed some bilateral apical poorly formed nodular disease in a tree-in-bud pattern.  Significance unclear.    Review of Systems  Constitutional: Positive for fatigue.  HENT: Positive for congestion, sinus pressure and sinus pain.   Respiratory: Positive for cough, chest tightness, shortness of breath and wheezing.   Neurological: Positive for dizziness and headaches.   Past Medical History:  Diagnosis Date  . DDD (degenerative disc disease)   . GERD (gastroesophageal reflux disease)   . History of hiatal hernia   . Hypertension   . Osteoarthritis   . Panic attacks   . Stroke [facility])    TIA 2 years ago  on plavix  . TMJ (dislocation of temporomandibular joint)      Family History  Problem Relation Age of Onset  . Rheum arthritis Unknown   . Cancer Unknown   . Osteoarthritis Unknown   . Heart disease Father   . Kidney disease Unknown   . Alcohol abuse Unknown   . Hypertension Unknown   . Goiter Unknown   . Emphysema Mother   . Lymphoma Mother   . Allergies Sister   . Asthma Sister   . Hodgkin's lymphoma Brother      Social History   Socioeconomic History  . Marital status: Married    Spouse name: Not on file  .  Number of children: 3  . Years of education: Not on file  . Highest education level: Not on file  Social Needs  . Financial resource strain: Not on file  . Food insecurity - worry: Not on file  . Food insecurity - inability: Not on file  . Transportation needs - medical: Not on file  . Transportation needs - non-medical: Not on file  Occupational History  . Occupation: Retired    Comment: Marine scientist  Tobacco Use  . Smoking status: Former Smoker    Packs/day: 1.00    Years: 15.00    Pack years: 15.00    Types: Cigarettes    Last attempt to quit: 03/09/1988    Years since quitting: 28.9  . Smokeless tobacco: Never Used  Substance and Sexual Activity  . Alcohol use: No  . Drug use: No  . Sexual activity: Not on file  Other Topics Concern  . Not on file  Social History Narrative  . Not on file     Allergies  Allergen Reactions  . Atorvastatin Other (See Comments)    Myalgia   . Lactose Diarrhea  . Mometasone Nausea And Vomiting and Other (See Comments)    headache  . Nasonex [Mometasone Furoate] Other (See Comments)    headache  . Neurontin [Gabapentin] Other (See Comments)    depression  Outpatient Medications Prior to Visit  Medication Sig Dispense Refill  . amLODipine (NORVASC) 5 MG tablet Take 5 mg by mouth daily.    . carvedilol (COREG) 25 MG tablet Take 25 mg by mouth 2 (two) times daily with a meal.    . clopidogrel (PLAVIX) 75 MG tablet Take 75 mg by mouth daily.    Marland Kitchen estradiol (ESTRACE) 1 MG tablet Take 1 mg by mouth 4 (four) times a week.     . fluticasone (FLONASE) 50 MCG/ACT nasal spray Place 1 spray into both nostrils daily as needed for allergies.     . hydrochlorothiazide (HYDRODIURIL) 25 MG tablet Take 25 mg by mouth daily.    Marland Kitchen levocetirizine (XYZAL) 5 MG tablet Take 5 mg by mouth daily as needed for allergies. Alternates between allegra and xyzal     . lisinopril (PRINIVIL,ZESTRIL) 40 MG tablet Take 40 mg by mouth daily.    Marland Kitchen PARoxetine (PAXIL) 10 MG  tablet Take 10 mg by mouth every evening.     Vladimir Faster Glycol-Propyl Glycol (SYSTANE OP) Apply 1 drop to eye daily.    . potassium chloride (K-DUR,KLOR-CON) 10 MEQ tablet Take 10 mEq by mouth daily.     Marland Kitchen tiZANidine (ZANAFLEX) 4 MG tablet Take 1 tablet (4 mg total) by mouth every 6 (six) hours as needed for muscle spasms. 56 tablet 0   No facility-administered medications prior to visit.         Objective:   Physical Exam Vitals:   02/02/17 1005  BP: 130/70  Pulse: 77  SpO2: 93%  Weight: 207 lb (93.9 kg)  Height: 5\' 5"  (1.651 m)   Gen: Pleasant, well-nourished, in no distress,  normal affect  ENT: No lesions,  mouth clear,  oropharynx clear, no postnasal drip  Neck: No JVD, no TMG, no carotid bruits  Lungs: No use of accessory muscles, clear without rales or rhonchi  Cardiovascular: RRR, heart sounds normal, no murmur or gallops, no peripheral edema  Musculoskeletal: No deformities, no cyanosis or clubbing  Neuro: alert, non focal  Skin: Warm, no lesions or rashes     Assessment & Plan:  Dyspnea With likely COPD based on clinical history, some emphysematous change on CT scan of the chest available for review from [place].  We will perform full pulmonary function testing.  Depending on results we will initiate scheduled bronchodilators.   Abnormal CT of the chest Bilateral apical loosely formed nodular disease and tree-in-bud pattern, etiology unclear especially in the aftermath of an apparent exacerbation.  Suspect inflammatory, infectious.  Consider atypical mycobacterial infection.  Some risk for malignancy given her tobacco history although appearance and bilateral nature make this less likely.  Needs a repeat CT scan of the chest and in 6 months to look for interval change.  This would be May 2019.  Baltazar Apo, MD, PhD 02/02/2017, 12:33 PM [facility] [phone] or if no answer [phone]

## 2017-02-02 NOTE — Assessment & Plan Note (Signed)
Bilateral apical loosely formed nodular disease and tree-in-bud pattern, etiology unclear especially in the aftermath of an apparent exacerbation.  Suspect inflammatory, infectious.  Consider atypical mycobacterial infection.  Some risk for malignancy given her tobacco history although appearance and bilateral nature make this less likely.  Needs a repeat CT scan of the chest and in 6 months to look for interval change.  This would be May 2019.

## 2017-02-03 ENCOUNTER — Ambulatory Visit (INDEPENDENT_AMBULATORY_CARE_PROVIDER_SITE_OTHER): Payer: Medicare Other | Admitting: Emergency Medicine

## 2017-02-03 DIAGNOSIS — R0600 Dyspnea, unspecified: Secondary | ICD-10-CM

## 2017-02-03 LAB — PULMONARY FUNCTION TEST
DL/VA % pred: 96 %
DL/VA: 4.61 ml/min/mmHg/L
DLCO cor % pred: 62 %
DLCO cor: 15.13 ml/min/mmHg
DLCO unc % pred: 65 %
DLCO unc: 15.99 ml/min/mmHg
FEF 25-75 Post: 1.27 L/sec
FEF 25-75 Pre: 0.75 L/sec
FEF2575-%Change-Post: 70 %
FEF2575-%Pred-Post: 68 %
FEF2575-%Pred-Pre: 40 %
FEV1-%Change-Post: 3 %
FEV1-%Pred-Post: 53 %
FEV1-%Pred-Pre: 51 %
FEV1-Post: 1.2 L
FEV1-Pre: 1.16 L
FEV1FVC-%Change-Post: 0 %
FEV1FVC-%Pred-Pre: 101 %
FEV6-%Change-Post: 0 %
FEV6-%Pred-Post: 53 %
FEV6-%Pred-Pre: 52 %
FEV6-Post: 1.52 L
FEV6-Pre: 1.5 L
FEV6FVC-%Change-Post: -2 %
FEV6FVC-%Pred-Post: 101 %
FEV6FVC-%Pred-Pre: 103 %
FVC-%Change-Post: 3 %
FVC-%Pred-Post: 52 %
FVC-%Pred-Pre: 50 %
FVC-Post: 1.56 L
FVC-Pre: 1.51 L
Post FEV1/FVC ratio: 77 %
Post FEV6/FVC ratio: 97 %
Pre FEV1/FVC ratio: 77 %
Pre FEV6/FVC Ratio: 100 %
RV % pred: 73 %
RV: 1.62 L
TLC % pred: 62 %
TLC: 3.14 L

## 2017-02-03 NOTE — Progress Notes (Signed)
PFT done today. 

## 2017-02-10 ENCOUNTER — Ambulatory Visit (INDEPENDENT_AMBULATORY_CARE_PROVIDER_SITE_OTHER): Payer: Medicare Other | Admitting: Emergency Medicine

## 2017-02-10 ENCOUNTER — Encounter: Payer: Self-pay | Admitting: Emergency Medicine

## 2017-02-10 DIAGNOSIS — R0600 Dyspnea, unspecified: Secondary | ICD-10-CM | POA: Diagnosis not present

## 2017-02-10 DIAGNOSIS — J449 Chronic obstructive pulmonary disease, unspecified: Secondary | ICD-10-CM

## 2017-02-10 DIAGNOSIS — R9389 Abnormal findings on diagnostic imaging of other specified body structures: Secondary | ICD-10-CM

## 2017-02-10 DIAGNOSIS — J439 Emphysema, unspecified: Secondary | ICD-10-CM | POA: Diagnosis not present

## 2017-02-10 NOTE — Assessment & Plan Note (Signed)
With micronodular disease.  I believe she needs a repeat CT scan of the chest in May 2019 we will arrange for this.  Depending on the results we will plan any appropriate intervention or testing

## 2017-02-10 NOTE — Patient Instructions (Signed)
We will plan to repeat your CT scan of the chest in May 2019, no contrast.  We will start Stiolto 2 puffs once a day to see if your breathing benefits.  Please call us to let us know if you have benefited.  If so we will order this medication through your pharmacy. Follow with Dr Lamonte Sakai in May after your CT scan of the chest to review the results.

## 2017-02-10 NOTE — Progress Notes (Signed)
Subjective:    Patient ID: Breanna [name], female    DOB: July 02, 1945, 71 y.o.   MRN: [id number]  HPI 71 year old former smoker (25 pack years), with hypertension (on ACE inhibitor), hiatal hernia, GERD, allergic rhinitis, prior TIA, DDD with cervical disk sgy done 05/2016.   She is referred for dyspnea. She reports that about 1 month ago developed an increase in some mild baseline dyspnea. Was associated with sinus drainage, some wheeze, so she was treated with pred + abx x 2. Treated for a possible CAP. Seems to notice it when she exerts, goes from sitting to standing - no real change since treated as above. She was therefore sent to ED 11/20, no evidence PE on CT chest, no overt infiltrates,   Scan of the chest that was performed on 01/26/17 at [facility] showed some bilateral apical poorly formed nodular disease in a tree-in-bud pattern.  Significance unclear.   ROV 02/10/17 --71 year old woman follows up today for evaluation of shortness of breath.  She also had a CT scan of her chest done 01/26/17 that showed some bilateral apical tree-in-bud nodular disease of unclear significance.  We arrange for pulmonary function testing which were done on 02/03/17 and I have reviewed.  Spirometry is consistent with restriction, consider possible superimposed obstruction.  Her lung volumes are restricted.  DLCO is decreased and corrects to the normal range when adjusted for her alveolar volume.    Review of Systems  Constitutional: Positive for fatigue.  HENT: Positive for congestion, sinus pressure and sinus pain.   Respiratory: Positive for cough, chest tightness, shortness of breath and wheezing.   Neurological: Positive for dizziness and headaches.   Past Medical History:  Diagnosis Date  . DDD (degenerative disc disease)   . GERD (gastroesophageal reflux disease)   . History of hiatal hernia   . Hypertension   . Osteoarthritis   . Panic attacks   . Stroke [facility])    TIA 2 years ago  on plavix  .  TMJ (dislocation of temporomandibular joint)      Family History  Problem Relation Age of Onset  . Rheum arthritis Unknown   . Cancer Unknown   . Osteoarthritis Unknown   . Heart disease Father   . Kidney disease Unknown   . Alcohol abuse Unknown   . Hypertension Unknown   . Goiter Unknown   . Emphysema Mother   . Lymphoma Mother   . Allergies Sister   . Asthma Sister   . Hodgkin's lymphoma Brother      Social History   Socioeconomic History  . Marital status: Married    Spouse name: Not on file  . Number of children: 3  . Years of education: Not on file  . Highest education level: Not on file  Social Needs  . Financial resource strain: Not on file  . Food insecurity - worry: Not on file  . Food insecurity - inability: Not on file  . Transportation needs - medical: Not on file  . Transportation needs - non-medical: Not on file  Occupational History  . Occupation: Retired    Comment: Marine scientist  Tobacco Use  . Smoking status: Former Smoker    Packs/day: 1.00    Years: 15.00    Pack years: 15.00    Types: Cigarettes    Last attempt to quit: 03/09/1988    Years since quitting: 28.9  . Smokeless tobacco: Never Used  Substance and Sexual Activity  . Alcohol use: No  . Drug use:  No  . Sexual activity: Not on file  Other Topics Concern  . Not on file  Social History Narrative  . Not on file     Allergies  Allergen Reactions  . Atorvastatin Other (See Comments)    Myalgia   . Lactose Diarrhea  . Mometasone Nausea And Vomiting and Other (See Comments)    headache  . Nasonex [Mometasone Furoate] Other (See Comments)    headache  . Neurontin [Gabapentin] Other (See Comments)    depression     Outpatient Medications Prior to Visit  Medication Sig Dispense Refill  . amLODipine (NORVASC) 5 MG tablet Take 5 mg by mouth daily.    . carvedilol (COREG) 25 MG tablet Take 25 mg by mouth 2 (two) times daily with a meal.    . clopidogrel (PLAVIX) 75 MG tablet Take 75 mg  by mouth daily.    Marland Kitchen estradiol (ESTRACE) 1 MG tablet Take 1 mg by mouth 4 (four) times a week.     . fluticasone (FLONASE) 50 MCG/ACT nasal spray Place 1 spray into both nostrils daily as needed for allergies.     . hydrochlorothiazide (HYDRODIURIL) 25 MG tablet Take 25 mg by mouth daily.    Marland Kitchen levocetirizine (XYZAL) 5 MG tablet Take 5 mg by mouth daily as needed for allergies. Alternates between allegra and xyzal     . lisinopril (PRINIVIL,ZESTRIL) 40 MG tablet Take 40 mg by mouth daily.    Marland Kitchen PARoxetine (PAXIL) 10 MG tablet Take 10 mg by mouth every evening.     Vladimir Faster Glycol-Propyl Glycol (SYSTANE OP) Apply 1 drop to eye daily.    . potassium chloride (K-DUR,KLOR-CON) 10 MEQ tablet Take 10 mEq by mouth daily.     Marland Kitchen tiZANidine (ZANAFLEX) 4 MG tablet Take 1 tablet (4 mg total) by mouth every 6 (six) hours as needed for muscle spasms. 56 tablet 0   No facility-administered medications prior to visit.         Objective:   Physical Exam Vitals:   02/10/17 1539  BP: 126/76  Pulse: 61  SpO2: 92%  Weight: 207 lb 6.4 oz (94.1 kg)  Height: 5\' 5"  (1.651 m)   Gen: Pleasant, well-nourished, in no distress,  normal affect  ENT: No lesions,  mouth clear,  oropharynx clear, no postnasal drip  Neck: No JVD, no TMG, no carotid bruits  Lungs: No use of accessory muscles, clear without rales or rhonchi  Cardiovascular: RRR, heart sounds normal, no murmur or gallops, no peripheral edema  Musculoskeletal: No deformities, no cyanosis or clubbing  Neuro: alert, non focal  Skin: Warm, no lesions or rashes     Assessment & Plan:  Abnormal CT of the chest With micronodular disease.  I believe she needs a repeat CT scan of the chest in May 2019 we will arrange for this.  Depending on the results we will plan any appropriate intervention or testing  Dyspnea Due to COPD and restrictive lung disease likely due to obesity/deconditioning based on her pulmonary function testing from  02/03/17  COPD (chronic obstructive pulmonary disease) ([place]) Based on her pulmonary function testing, tobacco history.  We will do a trial of Stiolto to see if she benefits.  If so we will continue.  Baltazar Apo, MD, PhD 02/10/2017, 4:08 PM [facility] [phone] or if no answer [phone]

## 2017-02-10 NOTE — Assessment & Plan Note (Signed)
Due to COPD and restrictive lung disease likely due to obesity/deconditioning based on her pulmonary function testing from 02/03/17

## 2017-02-10 NOTE — Assessment & Plan Note (Signed)
Based on her pulmonary function testing, tobacco history.  We will do a trial of Stiolto to see if she benefits.  If so we will continue.

## 2017-03-09 DIAGNOSIS — J189 Pneumonia, unspecified organism: Secondary | ICD-10-CM

## 2017-03-09 HISTORY — DX: Pneumonia, unspecified organism: J18.9

## 2017-03-09 HISTORY — PX: SHOULDER ARTHROSCOPY W/ ROTATOR CUFF REPAIR: SHX2400

## 2017-05-07 HISTORY — PX: BREAST BIOPSY: SHX20

## 2017-05-11 ENCOUNTER — Telehealth: Payer: Self-pay | Admitting: Emergency Medicine

## 2017-05-11 NOTE — Telephone Encounter (Signed)
Pt is requesting medical records to be sent to [facility] pulmonary.  I have provided pt with medical records number. Pt voiced her understanding. Nothing further is needed.

## 2017-05-19 ENCOUNTER — Encounter: Payer: Self-pay | Admitting: Hematology & Oncology

## 2017-05-25 ENCOUNTER — Other Ambulatory Visit: Payer: Self-pay | Admitting: Surgery

## 2017-05-25 ENCOUNTER — Ambulatory Visit: Payer: Medicare Other | Admitting: Hematology & Oncology

## 2017-05-25 ENCOUNTER — Other Ambulatory Visit: Payer: Medicare Other

## 2017-05-26 ENCOUNTER — Other Ambulatory Visit: Payer: Self-pay | Admitting: Surgery

## 2017-05-26 ENCOUNTER — Telehealth: Payer: Self-pay | Admitting: Hematology and Oncology

## 2017-05-26 DIAGNOSIS — C50912 Malignant neoplasm of unspecified site of left female breast: Secondary | ICD-10-CM

## 2017-05-26 NOTE — Telephone Encounter (Signed)
Appt has been scheduled for the pt to see Dr. Lindi Adie on 3/26 at 1pm. Pt aware to arrive 30 minutes early.

## 2017-05-28 ENCOUNTER — Encounter: Payer: Self-pay | Admitting: Radiation Oncology

## 2017-05-30 ENCOUNTER — Ambulatory Visit
Admission: RE | Admit: 2017-05-30 | Discharge: 2017-05-30 | Disposition: A | Discharge status: Home / Self Care | Payer: Medicare Other | Source: Ambulatory Visit | Attending: Surgery | Admitting: Surgery

## 2017-05-30 DIAGNOSIS — C50912 Malignant neoplasm of unspecified site of left female breast: Secondary | ICD-10-CM

## 2017-05-30 MED ORDER — GADOBENATE DIMEGLUMINE 529 MG/ML IV SOLN
20.0000 mL | Freq: Once | INTRAVENOUS | Status: AC | PRN
  Administered 2017-05-30: 20 mL via INTRAVENOUS

## 2017-06-01 ENCOUNTER — Telehealth: Payer: Self-pay

## 2017-06-01 ENCOUNTER — Other Ambulatory Visit: Payer: Self-pay | Admitting: Surgery

## 2017-06-01 ENCOUNTER — Inpatient Hospital Stay: Payer: Medicare Other | Attending: Hematology and Oncology | Admitting: Hematology and Oncology

## 2017-06-01 DIAGNOSIS — Z8673 Personal history of transient ischemic attack (TIA), and cerebral infarction without residual deficits: Secondary | ICD-10-CM | POA: Insufficient documentation

## 2017-06-01 DIAGNOSIS — C50412 Malignant neoplasm of upper-outer quadrant of left female breast: Secondary | ICD-10-CM | POA: Insufficient documentation

## 2017-06-01 DIAGNOSIS — I10 Essential (primary) hypertension: Secondary | ICD-10-CM | POA: Insufficient documentation

## 2017-06-01 DIAGNOSIS — Z87891 Personal history of nicotine dependence: Secondary | ICD-10-CM | POA: Diagnosis not present

## 2017-06-01 DIAGNOSIS — Z170 Estrogen receptor positive status [ER+]: Secondary | ICD-10-CM | POA: Insufficient documentation

## 2017-06-01 DIAGNOSIS — Z90710 Acquired absence of both cervix and uterus: Secondary | ICD-10-CM | POA: Diagnosis not present

## 2017-06-01 DIAGNOSIS — Z79899 Other long term (current) drug therapy: Secondary | ICD-10-CM | POA: Diagnosis not present

## 2017-06-01 DIAGNOSIS — Z853 Personal history of malignant neoplasm of breast: Secondary | ICD-10-CM

## 2017-06-01 DIAGNOSIS — M1990 Unspecified osteoarthritis, unspecified site: Secondary | ICD-10-CM | POA: Insufficient documentation

## 2017-06-01 DIAGNOSIS — K449 Diaphragmatic hernia without obstruction or gangrene: Secondary | ICD-10-CM | POA: Insufficient documentation

## 2017-06-01 DIAGNOSIS — K219 Gastro-esophageal reflux disease without esophagitis: Secondary | ICD-10-CM | POA: Insufficient documentation

## 2017-06-01 MED ORDER — VENLAFAXINE HCL ER 37.5 MG PO CP24: 37.5000 mg | ORAL_CAPSULE | Freq: Every day | ORAL | 3 refills | Status: DC
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
## 2017-06-01 NOTE — Telephone Encounter (Signed)
Called [facility] in [place] to obtain pt FISH for HER2, per Dr. Lindi Adie request. Spoke with pathology and was informed that [place] is still currently pending. Provided pathology call back number and fax number to send in or update with information as soon as they can. [facility] No: [phone].

## 2017-06-02 NOTE — Assessment & Plan Note (Signed)
05/13/17: Breanna [name] biopsy: Left breast biopsy 2 o'clock position 8 cm from nipple ultrasound-guided biopsy: Grade 2 IDC, ER 50%, PR 3%, HER-2 equivocal Ki-67 15% nipple area biopsy grade 2 IDC, ER 100% positive, PR negative, HER-2 equivocal, Ki-67 40%  Breast MRI 05/31/2017:Left breast UOQ 230 position: 2 x 1.9 x 1.9 cm mass, second mass left breast UOQ 230 position: 1 x 1 x 0.7 cm, 2 more enhancing adjacent 3 and 5 mm nodules slightly superiorly midway between the 2 masses.  No abnormal lymph nodes, right breast normal  Pathology and radiology counseling: Discussed with the patient, the details of pathology including the type of breast cancer,the clinical staging, the significance of ER, PR and HER-2/neu receptors and the implications for treatment. After reviewing the pathology in detail, we proceeded to discuss the different treatment options between surgery, radiation, chemotherapy, antiestrogen therapies.  Recommendation: 1.  Based on the MRI results, patient will need a mastectomy. 2. Oncotype DX testing to determine if she would benefit from systemic chemotherapy  3.  Followed by adjuvant antiestrogen therapy   I discussed with Dr. Ninfa Linden who agrees with the treatment plan. I called the patient and inform her of her discussion and she was inquiring whether she could have bilateral mastectomies.  I encouraged her to discuss it with Dr. Ninfa Linden.  Return to clinic after surgery to discuss the final pathology report.

## 2017-06-03 ENCOUNTER — Other Ambulatory Visit: Payer: Self-pay

## 2017-06-03 MED ORDER — SODIUM CHLORIDE 0.9 % IV SOLN: Freq: Once | INTRAVENOUS | Status: DC

## 2017-06-03 NOTE — Progress Notes (Signed)
Location of Breast Cancer: Left Breast (February 2019)  Histology per Pathology Report: From Dr. Geralyn Flash consult note: 05/13/17  [place] biopsy: Left breast biopsy 2 o'clock position 8 cm from nipple ultrasound-guided biopsy: Grade 2 IDC, ER 50%, PR 3%, HER-2 equivocal Ki-67 15% nipple area biopsy grade 2 IDC, ER 100% positive, PR negative, HER-2 equivocal, Ki-67 40%    Did patient present with symptoms or was this found on screening mammography?: It was found on a screening mammogram.   Past/Anticipated interventions by surgeon, if any: Office consult with Dr. Ninfa Linden 05/25/17.   Past/Anticipated interventions by medical oncology, if any: Dr. Lindi Adie 06/01/17 Recommendation: 1.  Based on the MRI results, patient will need a mastectomy. 2. Oncotype DX testing to determine if she would benefit from systemic chemotherapy  3.  Followed by adjuvant antiestrogen therapy   I discussed with Dr. Ninfa Linden who agrees with the treatment plan. I called the patient and inform her of her discussion and she was inquiring whether she could have bilateral mastectomies.  I encouraged her to discuss it with Dr. Ninfa Linden   Lymphedema issues, if any:   No evidence of lymphedema  Pain issues, if any:  Chronic aching neck pain related to effects of two neck surgeries. Reports pain is 4 on a scale of 0-10. Reports taking Tylenol and Tramadol to manage pain.   SAFETY ISSUES:  Prior radiation? No  Pacemaker/ICD? No  Possible current pregnancy? No  Is the patient on methotrexate? No  Current Complaints / other details:   05/31/17 MRI BREAST  Left breast UOQ 230 position: 2 x 1.9 x 1.9 cm mass, second mass left breast UOQ 230 position: 1 x 1 x 0.7 cm, 2 more enhancing adjacent 3 and 5 mm nodules slightly superiorly midway between the 2 masses.  No abnormal lymph nodes, right breast normal   Resides in [place], [place] (approximately 45 minutes away. Retired Marine scientist. Married. 3 grown boys. 3  grandchildren 2 girls (24,6) and 1 boy.      Scheduled for left breast mastectomy on Friday. Has limited ROM of right arm related to previous neck and shoulder surgery. Wears continuous oxygen therapy because of inability to breath when changing positions or laying flat.

## 2017-06-04 ENCOUNTER — Telehealth: Payer: Self-pay | Admitting: Hematology and Oncology

## 2017-06-04 NOTE — Telephone Encounter (Signed)
Mailed patient calendar of upcoming April appointments per 3/28 sch message

## 2017-06-07 ENCOUNTER — Ambulatory Visit: Payer: Medicare Other | Admitting: Emergency Medicine

## 2017-06-08 ENCOUNTER — Ambulatory Visit
Admission: RE | Admit: 2017-06-08 | Discharge: 2017-06-08 | Disposition: A | Discharge status: Home / Self Care | Payer: Self-pay | Source: Ambulatory Visit | Attending: Radiation Oncology | Admitting: Radiation Oncology

## 2017-06-08 ENCOUNTER — Other Ambulatory Visit: Payer: Self-pay | Admitting: Radiation Oncology

## 2017-06-08 ENCOUNTER — Inpatient Hospital Stay
Admission: RE | Admit: 2017-06-08 | Discharge: 2017-06-08 | Disposition: A | Discharge status: Home / Self Care | Payer: Self-pay | Source: Ambulatory Visit | Attending: Radiation Oncology | Admitting: Radiation Oncology

## 2017-06-08 DIAGNOSIS — C50419 Malignant neoplasm of upper-outer quadrant of unspecified female breast: Secondary | ICD-10-CM

## 2017-06-08 MED ORDER — SCOPOLAMINE 1 MG/3DAYS TD PT72
MEDICATED_PATCH | TRANSDERMAL | Status: AC
  Filled 2017-06-08: qty 1

## 2017-06-08 NOTE — Pre-Procedure Instructions (Signed)
Breanna [name]  06/08/2017      [place], [place] [address] Phone: [phone] Fax: [phone]    Your procedure is scheduled on Fri., June 11, 2017  Report to [facility] Admitting Entrance "A" at 5:30AM  Call this number if you have problems the morning of surgery:  [phone]   Remember:  Do not eat food or drink liquids after midnight.  Take these medicines the morning of surgery with A SIP OF WATER: AmLODipine (NORVASC), Carvedilol (COREG), Cetirizine (ZYRTEC), Lansoprazole (PREVACID), Montelukast (SINGULAIR), and Venlafaxine XR (EFFEXOR-XR). If needed  Acetaminophen (TYLENOL) for mild pain, TraMADol (ULTRAM) for moderate pain,  SYSTANE eye drops for dry eyes, and Fluticasone (FLONASE) for congestion.  Follow your doctor's instruction regarding Plavix.  As of today, stop taking all Aspirins, Vitamins, Fish oils, and Herbal medications. Also stop all NSAIDS i.e. Advil, Ibuprofen, Motrin, Aleve, Anaprox, Naproxen, BC and Goody Powders.  Please complete your PRE-SURGERY ENSURE that was given to before you leave your house the morning of surgery.  Please, if able, drink it in one setting. DO NOT SIP.   Do not wear jewelry, make-up or nail polish.  Do not wear lotions, powders, perfumes, or deodorant.  Do not shave 48 hours prior to surgery.   Do not bring valuables to the hospital.  [facility] is not responsible for any belongings or valuables.  Contacts, dentures or bridgework may not be worn into surgery.  Leave your suitcase in the car.  After surgery it may be brought to your room.  For patients admitted to the hospital, discharge time will be determined by your treatment team.  Patients discharged the day of surgery will not be allowed to drive home.   Special instructions:   [facility]- Preparing For Surgery  Before surgery, you can play an important role. Because skin is not  sterile, your skin needs to be as free of germs as possible. You can reduce the number of germs on your skin by washing with CHG (chlorahexidine gluconate) Soap before surgery.  CHG is an antiseptic cleaner which kills germs and bonds with the skin to continue killing germs even after washing.  Please do not use if you have an allergy to CHG or antibacterial soaps. If your skin becomes reddened/irritated stop using the CHG.  Do not shave (including legs and underarms) for at least 48 hours prior to first CHG shower. It is OK to shave your face.  Please follow these instructions carefully.   1. Shower the NIGHT BEFORE SURGERY and the MORNING OF SURGERY with CHG.   2. If you chose to wash your hair, wash your hair first as usual with your normal shampoo.  3. After you shampoo, rinse your hair and body thoroughly to remove the shampoo.  4. Use CHG as you would any other liquid soap. You can apply CHG directly to the skin and wash gently with a scrungie or a clean washcloth.   5. Apply the CHG Soap to your body ONLY FROM THE NECK DOWN.  Do not use on open wounds or open sores. Avoid contact with your eyes, ears, mouth and genitals (private parts). Wash Face and genitals (private parts)  with your normal soap.  6. Wash thoroughly, paying special attention to the area where your surgery will be performed.  7. Thoroughly rinse your body with warm water from the neck down.  8. DO NOT shower/wash  with your normal soap after using and rinsing off the CHG Soap.  9. Pat yourself dry with a CLEAN TOWEL.  10. Wear CLEAN PAJAMAS to bed the night before surgery, wear comfortable clothes the morning of surgery  11. Place CLEAN SHEETS on your bed the night of your first shower and DO NOT SLEEP WITH PETS.  Day of Surgery: Do not apply any deodorants/lotions. Please wear clean clothes to the hospital/surgery center.    Please read over the following fact sheets that you were given. Pain Booklet,  Coughing and Deep Breathing and Surgical Site Infection Prevention

## 2017-06-09 ENCOUNTER — Encounter: Payer: Self-pay | Admitting: Radiation Oncology

## 2017-06-09 ENCOUNTER — Other Ambulatory Visit: Payer: Self-pay

## 2017-06-09 ENCOUNTER — Ambulatory Visit
Admission: RE | Admit: 2017-06-09 | Discharge: 2017-06-09 | Disposition: A | Discharge status: Home / Self Care | Payer: Medicare Other | Source: Ambulatory Visit | Attending: Radiation Oncology | Admitting: Radiation Oncology

## 2017-06-09 ENCOUNTER — Encounter (HOSPITAL_COMMUNITY): Payer: Self-pay

## 2017-06-09 ENCOUNTER — Encounter (HOSPITAL_COMMUNITY)
Admission: RE | Admit: 2017-06-09 | Discharge: 2017-06-09 | Disposition: A | Discharge status: Home / Self Care | Payer: Medicare Other | Source: Ambulatory Visit | Attending: Surgery | Admitting: Surgery

## 2017-06-09 VITALS — BP 121/72 | HR 60 | Temp 98.2°F | Resp 18 | Wt 214.4 lb

## 2017-06-09 DIAGNOSIS — Z9889 Other specified postprocedural states: Secondary | ICD-10-CM

## 2017-06-09 DIAGNOSIS — Z7902 Long term (current) use of antithrombotics/antiplatelets: Secondary | ICD-10-CM | POA: Insufficient documentation

## 2017-06-09 DIAGNOSIS — Z79899 Other long term (current) drug therapy: Secondary | ICD-10-CM | POA: Insufficient documentation

## 2017-06-09 DIAGNOSIS — Z8673 Personal history of transient ischemic attack (TIA), and cerebral infarction without residual deficits: Secondary | ICD-10-CM

## 2017-06-09 DIAGNOSIS — I10 Essential (primary) hypertension: Secondary | ICD-10-CM

## 2017-06-09 DIAGNOSIS — Z853 Personal history of malignant neoplasm of breast: Secondary | ICD-10-CM | POA: Insufficient documentation

## 2017-06-09 DIAGNOSIS — Z87891 Personal history of nicotine dependence: Secondary | ICD-10-CM | POA: Insufficient documentation

## 2017-06-09 DIAGNOSIS — R0602 Shortness of breath: Secondary | ICD-10-CM

## 2017-06-09 DIAGNOSIS — C50412 Malignant neoplasm of upper-outer quadrant of left female breast: Secondary | ICD-10-CM

## 2017-06-09 DIAGNOSIS — M1990 Unspecified osteoarthritis, unspecified site: Secondary | ICD-10-CM | POA: Insufficient documentation

## 2017-06-09 DIAGNOSIS — Z170 Estrogen receptor positive status [ER+]: Principal | ICD-10-CM

## 2017-06-09 DIAGNOSIS — Z881 Allergy status to other antibiotic agents status: Secondary | ICD-10-CM

## 2017-06-09 DIAGNOSIS — K449 Diaphragmatic hernia without obstruction or gangrene: Secondary | ICD-10-CM | POA: Insufficient documentation

## 2017-06-09 DIAGNOSIS — Z811 Family history of alcohol abuse and dependence: Secondary | ICD-10-CM

## 2017-06-09 DIAGNOSIS — Z825 Family history of asthma and other chronic lower respiratory diseases: Secondary | ICD-10-CM

## 2017-06-09 DIAGNOSIS — Z888 Allergy status to other drugs, medicaments and biological substances status: Secondary | ICD-10-CM

## 2017-06-09 DIAGNOSIS — Z79891 Long term (current) use of opiate analgesic: Secondary | ICD-10-CM | POA: Insufficient documentation

## 2017-06-09 DIAGNOSIS — Z807 Family history of other malignant neoplasms of lymphoid, hematopoietic and related tissues: Secondary | ICD-10-CM | POA: Insufficient documentation

## 2017-06-09 DIAGNOSIS — K219 Gastro-esophageal reflux disease without esophagitis: Secondary | ICD-10-CM

## 2017-06-09 DIAGNOSIS — Z96652 Presence of left artificial knee joint: Secondary | ICD-10-CM | POA: Insufficient documentation

## 2017-06-09 DIAGNOSIS — Z8249 Family history of ischemic heart disease and other diseases of the circulatory system: Secondary | ICD-10-CM

## 2017-06-09 HISTORY — DX: Pneumonia, unspecified organism: J18.9

## 2017-06-09 LAB — BASIC METABOLIC PANEL
ANION GAP: 8 (ref 5–15)
BUN: 8 mg/dL (ref 6–20)
CHLORIDE: 100 mmol/L — AB (ref 101–111)
CO2: 29 mmol/L (ref 22–32)
Calcium: 9.1 mg/dL (ref 8.9–10.3)
Creatinine, Ser: 0.61 mg/dL (ref 0.44–1.00)
GFR calc non Af Amer: >60 mL/min (ref >60)
GLUCOSE: 97 mg/dL (ref 65–99)
Potassium: 3.6 mmol/L (ref 3.5–5.1)
Sodium: 137 mmol/L (ref 135–145)

## 2017-06-09 LAB — CBC
HEMATOCRIT: 40.5 % (ref 36.0–46.0)
HEMOGLOBIN: 13 g/dL (ref 12.0–15.0)
MCH: 30.8 pg (ref 26.0–34.0)
MCHC: 32.1 g/dL (ref 30.0–36.0)
MCV: 96 fL (ref 78.0–100.0)
Platelets: 190 10*3/uL (ref 150–400)
RBC: 4.22 MIL/uL (ref 3.87–5.11)
RDW: 12.9 % (ref 11.5–15.5)
WBC: 4.6 10*3/uL (ref 4.0–10.5)

## 2017-06-09 NOTE — Progress Notes (Addendum)
PCP - Dr. Laurell Roof- [facility] in [place], [place]  Cardiologist - Dr. Tommie Sams- [facility] in [place], [place]  Pulm- Dr. [facility]  Chest x-ray - Denies  EKG - 09/18/16 (E)  Stress Test - 3 yrs- Requested  ECHO - 3 yrs- Requested  Cardiac Cath - Denies  Sleep Study - Denies- Pt uses continuous O2 at 2L CPAP - None  LABS- 06/09/17: CBC, BMP  Pt's last dose of Plavix was 3/29  Anesthesia- Yes- EKG  Pt denies having chest pain, sob, or fever at this time. All instructions explained to the pt, with a verbal understanding of the material. Pt agrees to go over the instructions while at home for a better understanding. The opportunity to ask questions was provided.

## 2017-06-09 NOTE — Progress Notes (Signed)
See progress note under physician encounter. 
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
## 2017-06-10 NOTE — Anesthesia Preprocedure Evaluation (Addendum)
Anesthesia Evaluation  Patient identified by MRN, date of birth, ID band Patient awake    Reviewed: Allergy & Precautions, NPO status , Patient's Chart, lab work & pertinent test results  Airway Mallampati: II  TM Distance: >3 FB Neck ROM: Full    Dental no notable dental hx.    Pulmonary shortness of breath and Long-Term Oxygen Therapy, COPD, former smoker,    Pulmonary exam normal breath sounds clear to auscultation       Cardiovascular Exercise Tolerance: Good hypertension, Pt. on medications Normal cardiovascular exam Rhythm:Regular Rate:Normal     Neuro/Psych Anxiety CVA    GI/Hepatic Neg liver ROS, GERD  ,  Endo/Other  negative endocrine ROSMorbid obesity  Renal/GU negative Renal ROS     Musculoskeletal   Abdominal   Peds  Hematology   Anesthesia Other Findings   Reproductive/Obstetrics                          Lab Results  Component Value Date   CREATININE 0.61 06/09/2017   BUN 8 06/09/2017   NA 137 06/09/2017   K 3.6 06/09/2017   CL 100 (L) 06/09/2017   CO2 29 06/09/2017    Lab Results  Component Value Date   WBC 4.6 06/09/2017   HGB 13.0 06/09/2017   HCT 40.5 06/09/2017   MCV 96.0 06/09/2017   PLT 190 06/09/2017    Anesthesia Physical Anesthesia Plan  ASA: III  Anesthesia Plan: General and Regional   Post-op Pain Management:  Regional for Post-op pain   Induction: Intravenous  PONV Risk Score and Plan: 2 and Treatment may vary due to age or medical condition, Ondansetron and Dexamethasone  Airway Management Planned: Oral ETT  Additional Equipment:   Intra-op Plan:   Post-operative Plan: Extubation in OR  Informed Consent: I have reviewed the patients History and Physical, chart, labs and discussed the procedure including the risks, benefits and alternatives for the proposed anesthesia with the patient or authorized representative who has indicated his/her  understanding and acceptance.   Dental advisory given  Plan Discussed with: CRNA  Anesthesia Plan Comments:         Anesthesia Quick Evaluation

## 2017-06-10 NOTE — Progress Notes (Signed)
Anesthesia Chart Review:  Pt is a 72 year old female scheduled for L breast mastectomy with sentinal lymph node biopsy on 06/11/2017 with Coralie Keens, MD  Providers:  - PCP is Sherrilee Gilles, DO in [place], [place] is Dr. Tamala Julian at [facility] in [place], [place]. Last office visit 06/08/17.  Dx 05/12/17 with chronic hypoxemic respiratory failure, SOB; started on 2L O2 at all times. By notes, pt had a CT chest 01/2017 that showed bilateral tree-in-bud nodularity and had PFTs 01/2017 that showed no evidence of obstruction and moderate disease in diffusion capacity with a DLCO at 62% predicted  - Cardiologist is Lydia Guiles, MD at [facility] in [place], [place]. Last office visit 04/26/17 for dyspnea; Echo ordered (results below),; no cardiac pathology identified that would explain her symptoms, patient referred to pulmonology. F/u prn recommended.   PMH includes: HTN, TIA, GERD. Pt uses 2L O2 at all times. Former smoker (quit 1990). BMI 35.5.S/p posterior cervical fusion 09/23/16. S/p ACDF 05/15/16. S/p L TKA 08/14/14.   Medications include: Amlodipine, carvedilol, Plavix, HCTZ, Prevacid, lisinopril, potassium. Pt to stop plavix 4 days before surgery.  Pulse (!) 56   Temp 36.8 C (Oral)   Resp 18   Ht 5\' 5"  (1.651 m)   Wt 213 lb (96.6 kg)   SpO2 95%   BMI 35.45 kg/m    Preoperative labs reviewed.   EKG 09/18/16: Sinus bradycardia (57 bpm). LVH. Septal infarct, age undetermined. Nonspecific ST abnormality. No significant change when compared to EKG 03/27/16 (found in correspondence 05/19/16 in media tab) and EKG 06/05/14 (found in correspondence 08/17/14 in media tab).  Echo 04/13/17 [facility] cardiovascular [place], [place], [place]): 1.  LV cavity size normal.  Wall thickness mildly increased in a pattern of concentric LVH.  Systolic function vigorous.  Estimated EF 65-75%. 2.  Doppler echocardiography is normal.  No echocardiographic findings to explain  patient's presenting complaint of dyspnea  Stress echo 10/02/13 (correspondence 08/17/14 in media tab):  - Negative for ischemia. Achieved 9.6 METS. LV size normal. LV global systolic function normal. EF 65%. No LV regional wall motion abnormalities.  I spoke with pt by telephone. She reports progressive SOB since first cervical spine surgery March 2018.  Has been evaluated by cardiology (no cardiac cause identified) and is currently seeing pulmonology.  Pt reports she was told her dyspnea is due to smoking hx from 25 years ago and obesity.  She was started on 2L O2 and feels her symptoms have improved with this. Pt feels progressive SOB is somehow related to cervical spine surgery last March 2018.   If no changes, I anticipate pt can proceed with surgery as scheduled.   Willeen Cass, FNP-BC [facility]/Anesthesiology Phone: [phone] 06/10/2017 4:37 PM
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
## 2017-06-11 ENCOUNTER — Encounter (HOSPITAL_COMMUNITY): Payer: Self-pay | Admitting: General Practice

## 2017-06-11 ENCOUNTER — Encounter (HOSPITAL_COMMUNITY)
Admission: RE | Disposition: A | Discharge status: Home / Self Care | Payer: Self-pay | Source: Ambulatory Visit | Attending: Surgery

## 2017-06-11 ENCOUNTER — Other Ambulatory Visit: Payer: Self-pay

## 2017-06-11 ENCOUNTER — Inpatient Hospital Stay (HOSPITAL_COMMUNITY)
Admission: RE | Admit: 2017-06-11 | Discharge: 2017-06-13 | DRG: 908 | Disposition: A | Discharge status: Home / Self Care | Payer: Medicare Other | Source: Ambulatory Visit | Attending: Surgery | Admitting: Surgery

## 2017-06-11 ENCOUNTER — Ambulatory Visit (HOSPITAL_COMMUNITY): Payer: Medicare Other | Admitting: Vascular Surgery

## 2017-06-11 ENCOUNTER — Ambulatory Visit (HOSPITAL_COMMUNITY): Payer: Medicare Other | Admitting: Anesthesiology

## 2017-06-11 ENCOUNTER — Encounter (HOSPITAL_COMMUNITY)
Admission: RE | Admit: 2017-06-11 | Discharge: 2017-06-11 | Disposition: A | Discharge status: Home / Self Care | Payer: Medicare Other | Source: Ambulatory Visit | Attending: Surgery | Admitting: Surgery

## 2017-06-11 ENCOUNTER — Encounter: Payer: Self-pay | Admitting: Radiation Oncology

## 2017-06-11 ENCOUNTER — Encounter (HOSPITAL_COMMUNITY): Payer: Self-pay | Admitting: *Deleted

## 2017-06-11 DIAGNOSIS — Y838 Other surgical procedures as the cause of abnormal reaction of the patient, or of later complication, without mention of misadventure at the time of the procedure: Secondary | ICD-10-CM | POA: Diagnosis present

## 2017-06-11 DIAGNOSIS — J449 Chronic obstructive pulmonary disease, unspecified: Secondary | ICD-10-CM | POA: Diagnosis present

## 2017-06-11 DIAGNOSIS — Z9012 Acquired absence of left breast and nipple: Secondary | ICD-10-CM

## 2017-06-11 DIAGNOSIS — Z853 Personal history of malignant neoplasm of breast: Secondary | ICD-10-CM

## 2017-06-11 DIAGNOSIS — Z90710 Acquired absence of both cervix and uterus: Secondary | ICD-10-CM

## 2017-06-11 DIAGNOSIS — I10 Essential (primary) hypertension: Secondary | ICD-10-CM | POA: Diagnosis present

## 2017-06-11 DIAGNOSIS — J302 Other seasonal allergic rhinitis: Secondary | ICD-10-CM | POA: Diagnosis present

## 2017-06-11 DIAGNOSIS — Z79899 Other long term (current) drug therapy: Secondary | ICD-10-CM

## 2017-06-11 DIAGNOSIS — L7632 Postprocedural hematoma of skin and subcutaneous tissue following other procedure: Principal | ICD-10-CM | POA: Diagnosis present

## 2017-06-11 DIAGNOSIS — Z7902 Long term (current) use of antithrombotics/antiplatelets: Secondary | ICD-10-CM

## 2017-06-11 DIAGNOSIS — F419 Anxiety disorder, unspecified: Secondary | ICD-10-CM | POA: Diagnosis present

## 2017-06-11 DIAGNOSIS — Z888 Allergy status to other drugs, medicaments and biological substances status: Secondary | ICD-10-CM

## 2017-06-11 DIAGNOSIS — D62 Acute posthemorrhagic anemia: Secondary | ICD-10-CM | POA: Diagnosis present

## 2017-06-11 DIAGNOSIS — Z79891 Long term (current) use of opiate analgesic: Secondary | ICD-10-CM

## 2017-06-11 DIAGNOSIS — Z79890 Hormone replacement therapy (postmenopausal): Secondary | ICD-10-CM

## 2017-06-11 DIAGNOSIS — C50412 Malignant neoplasm of upper-outer quadrant of left female breast: Secondary | ICD-10-CM

## 2017-06-11 DIAGNOSIS — Z170 Estrogen receptor positive status [ER+]: Secondary | ICD-10-CM

## 2017-06-11 DIAGNOSIS — Z8249 Family history of ischemic heart disease and other diseases of the circulatory system: Secondary | ICD-10-CM

## 2017-06-11 DIAGNOSIS — C50812 Malignant neoplasm of overlapping sites of left female breast: Secondary | ICD-10-CM | POA: Diagnosis present

## 2017-06-11 DIAGNOSIS — Z87891 Personal history of nicotine dependence: Secondary | ICD-10-CM

## 2017-06-11 DIAGNOSIS — Y813 Surgical instruments, materials and general- and plastic-surgery devices (including sutures) associated with adverse incidents: Secondary | ICD-10-CM | POA: Diagnosis present

## 2017-06-11 DIAGNOSIS — Z8673 Personal history of transient ischemic attack (TIA), and cerebral infarction without residual deficits: Secondary | ICD-10-CM

## 2017-06-11 DIAGNOSIS — Z9981 Dependence on supplemental oxygen: Secondary | ICD-10-CM

## 2017-06-11 DIAGNOSIS — Y92230 Patient room in hospital as the place of occurrence of the external cause: Secondary | ICD-10-CM | POA: Diagnosis present

## 2017-06-11 DIAGNOSIS — Z881 Allergy status to other antibiotic agents status: Secondary | ICD-10-CM

## 2017-06-11 HISTORY — PX: MASTECTOMY W/ SENTINEL NODE BIOPSY: SHX2001

## 2017-06-11 HISTORY — DX: Malignant neoplasm of unspecified site of left female breast: C50.912

## 2017-06-11 HISTORY — DX: Personal history of other medical treatment: Z92.89

## 2017-06-11 HISTORY — PX: MASTECTOMY COMPLETE / SIMPLE W/ SENTINEL NODE BIOPSY: SUR846

## 2017-06-11 HISTORY — DX: Dependence on supplemental oxygen: Z99.81

## 2017-06-11 HISTORY — DX: Transient cerebral ischemic attack, unspecified: G45.9

## 2017-06-11 HISTORY — DX: Unspecified osteoarthritis, unspecified site: M19.90

## 2017-06-11 SURGERY — MASTECTOMY WITH SENTINEL LYMPH NODE BIOPSY
Anesthesia: Regional | Site: Breast | Laterality: Left

## 2017-06-11 MED ORDER — FENTANYL CITRATE (PF) 250 MCG/5ML IJ SOLN
INTRAMUSCULAR | Status: AC
  Filled 2017-06-11: qty 5

## 2017-06-11 MED ORDER — SUGAMMADEX SODIUM 200 MG/2ML IV SOLN
INTRAVENOUS | Status: DC | PRN
  Administered 2017-06-11: 200 mg via INTRAVENOUS

## 2017-06-11 MED ORDER — MEPERIDINE HCL 50 MG/ML IJ SOLN: 6.2500 mg | INTRAMUSCULAR | Status: DC | PRN

## 2017-06-11 MED ORDER — TRAMADOL HCL 50 MG PO TABS
50.0000 mg | ORAL_TABLET | Freq: Four times a day (QID) | ORAL | Status: DC | PRN
  Filled 2017-06-11: qty 1

## 2017-06-11 MED ORDER — ROCURONIUM BROMIDE 100 MG/10ML IV SOLN
INTRAVENOUS | Status: DC | PRN
  Administered 2017-06-11: 40 mg via INTRAVENOUS

## 2017-06-11 MED ORDER — AMLODIPINE BESYLATE 5 MG PO TABS
5.0000 mg | ORAL_TABLET | Freq: Every day | ORAL | Status: DC
  Administered 2017-06-13: 5 mg via ORAL
  Filled 2017-06-11: qty 1

## 2017-06-11 MED ORDER — CHLORHEXIDINE GLUCONATE CLOTH 2 % EX PADS: 6.0000 | MEDICATED_PAD | Freq: Once | CUTANEOUS | Status: DC

## 2017-06-11 MED ORDER — POLYVINYL ALCOHOL 1.4 % OP SOLN
1.0000 [drp] | OPHTHALMIC | Status: DC | PRN
  Filled 2017-06-11: qty 15

## 2017-06-11 MED ORDER — PROPOFOL 10 MG/ML IV BOLUS
INTRAVENOUS | Status: DC | PRN
  Administered 2017-06-11: 200 mg via INTRAVENOUS

## 2017-06-11 MED ORDER — CEFAZOLIN SODIUM-DEXTROSE 2-4 GM/100ML-% IV SOLN
INTRAVENOUS | Status: AC
  Filled 2017-06-11: qty 100

## 2017-06-11 MED ORDER — CIPROFLOXACIN IN D5W 400 MG/200ML IV SOLN
INTRAVENOUS | Status: DC | PRN
  Administered 2017-06-11: 400 mg via INTRAVENOUS

## 2017-06-11 MED ORDER — EPHEDRINE SULFATE 50 MG/ML IJ SOLN
INTRAMUSCULAR | Status: DC | PRN
  Administered 2017-06-11 (×2): 10 mg via INTRAVENOUS

## 2017-06-11 MED ORDER — OXYCODONE HCL 5 MG PO TABS: 5.0000 mg | ORAL_TABLET | Freq: Four times a day (QID) | ORAL | 0 refills | Status: DC | PRN

## 2017-06-11 MED ORDER — PROPOFOL 10 MG/ML IV BOLUS
INTRAVENOUS | Status: AC
  Filled 2017-06-11: qty 20

## 2017-06-11 MED ORDER — LACTATED RINGERS IV SOLN
INTRAVENOUS | Status: DC | PRN
  Administered 2017-06-11 (×2): via INTRAVENOUS

## 2017-06-11 MED ORDER — LIDOCAINE HCL (CARDIAC) 20 MG/ML IV SOLN
INTRAVENOUS | Status: DC | PRN
  Administered 2017-06-11: 100 mg via INTRAVENOUS

## 2017-06-11 MED ORDER — CEFAZOLIN SODIUM-DEXTROSE 2-4 GM/100ML-% IV SOLN: 2.0000 g | INTRAVENOUS | Status: DC

## 2017-06-11 MED ORDER — PAROXETINE HCL 10 MG PO TABS
10.0000 mg | ORAL_TABLET | Freq: Every evening | ORAL | Status: DC
  Administered 2017-06-11 – 2017-06-12 (×2): 10 mg via ORAL
  Filled 2017-06-11 (×3): qty 1

## 2017-06-11 MED ORDER — MIDAZOLAM HCL 2 MG/2ML IJ SOLN
INTRAMUSCULAR | Status: AC
  Administered 2017-06-11: 2 mg
  Filled 2017-06-11: qty 2

## 2017-06-11 MED ORDER — METHYLENE BLUE 0.5 % INJ SOLN
INTRAVENOUS | Status: AC
  Filled 2017-06-11: qty 10

## 2017-06-11 MED ORDER — TECHNETIUM TC 99M SULFUR COLLOID FILTERED
1.0000 | Freq: Once | INTRAVENOUS | Status: AC | PRN
  Administered 2017-06-11: 1 via INTRADERMAL

## 2017-06-11 MED ORDER — 0.9 % SODIUM CHLORIDE (POUR BTL) OPTIME
TOPICAL | Status: DC | PRN
  Administered 2017-06-11: 1000 mL

## 2017-06-11 MED ORDER — HYDROMORPHONE HCL 1 MG/ML IJ SOLN
INTRAMUSCULAR | Status: AC
  Filled 2017-06-11: qty 1

## 2017-06-11 MED ORDER — MORPHINE SULFATE (PF) 4 MG/ML IV SOLN: 1.0000 mg | INTRAVENOUS | Status: DC | PRN

## 2017-06-11 MED ORDER — HYDROMORPHONE HCL 1 MG/ML IJ SOLN
0.2500 mg | INTRAMUSCULAR | Status: DC | PRN
  Administered 2017-06-11: 0.5 mg via INTRAVENOUS

## 2017-06-11 MED ORDER — SUGAMMADEX SODIUM 200 MG/2ML IV SOLN
INTRAVENOUS | Status: AC
Start: 2017-06-11 — End: ?
  Filled 2017-06-11: qty 4

## 2017-06-11 MED ORDER — FENTANYL CITRATE (PF) 100 MCG/2ML IJ SOLN
INTRAMUSCULAR | Status: AC
  Administered 2017-06-11: 100 ug
  Filled 2017-06-11: qty 2

## 2017-06-11 MED ORDER — ROPIVACAINE HCL 2 MG/ML IJ SOLN
INTRAMUSCULAR | Status: DC | PRN
  Administered 2017-06-11: 30 mL

## 2017-06-11 MED ORDER — ENOXAPARIN SODIUM 40 MG/0.4ML ~~LOC~~ SOLN: 40.0000 mg | SUBCUTANEOUS | Status: DC

## 2017-06-11 MED ORDER — POLYETHYL GLYCOL-PROPYL GLYCOL 0.4-0.3 % OP GEL: Freq: Every day | OPHTHALMIC | Status: DC | PRN

## 2017-06-11 MED ORDER — DEXAMETHASONE SODIUM PHOSPHATE 10 MG/ML IJ SOLN
INTRAMUSCULAR | Status: AC
  Filled 2017-06-11: qty 1

## 2017-06-11 MED ORDER — OXYCODONE HCL 5 MG PO TABS
5.0000 mg | ORAL_TABLET | ORAL | Status: DC | PRN
  Administered 2017-06-11 – 2017-06-13 (×2): 10 mg via ORAL
  Filled 2017-06-11 (×2): qty 2

## 2017-06-11 MED ORDER — DEXAMETHASONE SODIUM PHOSPHATE 10 MG/ML IJ SOLN
INTRAMUSCULAR | Status: DC | PRN
  Administered 2017-06-11: 10 mg via INTRAVENOUS

## 2017-06-11 MED ORDER — ONDANSETRON HCL 4 MG/2ML IJ SOLN: 4.0000 mg | Freq: Four times a day (QID) | INTRAMUSCULAR | Status: DC | PRN

## 2017-06-11 MED ORDER — HYDROCHLOROTHIAZIDE 25 MG PO TABS
25.0000 mg | ORAL_TABLET | Freq: Every day | ORAL | Status: DC
  Administered 2017-06-11 – 2017-06-13 (×2): 25 mg via ORAL
  Filled 2017-06-11 (×2): qty 1

## 2017-06-11 MED ORDER — POTASSIUM CHLORIDE IN NACL 20-0.9 MEQ/L-% IV SOLN
INTRAVENOUS | Status: DC
  Administered 2017-06-11 – 2017-06-12 (×2): via INTRAVENOUS
  Filled 2017-06-11 (×2): qty 1000

## 2017-06-11 MED ORDER — PROMETHAZINE HCL 25 MG/ML IJ SOLN: 6.2500 mg | INTRAMUSCULAR | Status: DC | PRN

## 2017-06-11 MED ORDER — MIDAZOLAM HCL 2 MG/2ML IJ SOLN
INTRAMUSCULAR | Status: AC
  Filled 2017-06-11: qty 2

## 2017-06-11 MED ORDER — LISINOPRIL 40 MG PO TABS
40.0000 mg | ORAL_TABLET | Freq: Every day | ORAL | Status: DC
  Administered 2017-06-13: 40 mg via ORAL
  Filled 2017-06-11: qty 1

## 2017-06-11 MED ORDER — ONDANSETRON HCL 4 MG/2ML IJ SOLN
INTRAMUSCULAR | Status: AC
  Filled 2017-06-11: qty 2

## 2017-06-11 MED ORDER — ACETAMINOPHEN 10 MG/ML IV SOLN: 1000.0000 mg | Freq: Once | INTRAVENOUS | Status: DC | PRN

## 2017-06-11 MED ORDER — SODIUM CHLORIDE 0.9 % IJ SOLN
INTRAMUSCULAR | Status: AC
Start: 2017-06-11 — End: ?
  Filled 2017-06-11: qty 10

## 2017-06-11 MED ORDER — ONDANSETRON HCL 4 MG/2ML IJ SOLN
INTRAMUSCULAR | Status: DC | PRN
  Administered 2017-06-11: 4 mg via INTRAVENOUS

## 2017-06-11 MED ORDER — FENTANYL CITRATE (PF) 100 MCG/2ML IJ SOLN
INTRAMUSCULAR | Status: DC | PRN
  Administered 2017-06-11: 50 ug via INTRAVENOUS
  Administered 2017-06-11: 100 ug via INTRAVENOUS

## 2017-06-11 MED ORDER — LACTATED RINGERS IV SOLN
INTRAVENOUS | Status: DC
  Administered 2017-06-11: 07:00:00 via INTRAVENOUS

## 2017-06-11 MED ORDER — CIPROFLOXACIN IN D5W 400 MG/200ML IV SOLN
INTRAVENOUS | Status: AC
  Filled 2017-06-11: qty 200

## 2017-06-11 MED ORDER — VENLAFAXINE HCL ER 37.5 MG PO CP24
37.5000 mg | ORAL_CAPSULE | Freq: Every day | ORAL | Status: DC
  Administered 2017-06-13: 37.5 mg via ORAL
  Filled 2017-06-11 (×2): qty 1

## 2017-06-11 MED ORDER — CARVEDILOL 25 MG PO TABS
25.0000 mg | ORAL_TABLET | Freq: Two times a day (BID) | ORAL | Status: DC
  Administered 2017-06-11 – 2017-06-12 (×3): 25 mg via ORAL
  Filled 2017-06-11 (×4): qty 1

## 2017-06-11 MED ORDER — ONDANSETRON 4 MG PO TBDP: 4.0000 mg | ORAL_TABLET | Freq: Four times a day (QID) | ORAL | Status: DC | PRN

## 2017-06-11 MED ORDER — MONTELUKAST SODIUM 10 MG PO TABS
10.0000 mg | ORAL_TABLET | Freq: Every day | ORAL | Status: DC
  Administered 2017-06-13: 10 mg via ORAL
  Filled 2017-06-11: qty 1

## 2017-06-11 MED ORDER — HYDROCODONE-ACETAMINOPHEN 7.5-325 MG PO TABS: 1.0000 | ORAL_TABLET | Freq: Once | ORAL | Status: DC | PRN

## 2017-06-11 SURGICAL SUPPLY — 43 items
APPLIER CLIP 9.375 MED OPEN (MISCELLANEOUS) ×3
BINDER BREAST LRG (GAUZE/BANDAGES/DRESSINGS) IMPLANT
BINDER BREAST XLRG (GAUZE/BANDAGES/DRESSINGS) ×3 IMPLANT
CANISTER SUCT 3000ML PPV (MISCELLANEOUS) ×3 IMPLANT
CHLORAPREP W/TINT 26ML (MISCELLANEOUS) ×3 IMPLANT
CLIP APPLIE 9.375 MED OPEN (MISCELLANEOUS) ×1 IMPLANT
CONT SPEC 4OZ CLIKSEAL STRL BL (MISCELLANEOUS) ×3 IMPLANT
COVER PROBE W GEL 5X96 (DRAPES) ×3 IMPLANT
COVER SURGICAL LIGHT HANDLE (MISCELLANEOUS) ×3 IMPLANT
DERMABOND ADVANCED (GAUZE/BANDAGES/DRESSINGS) ×2
DERMABOND ADVANCED .7 DNX12 (GAUZE/BANDAGES/DRESSINGS) ×1 IMPLANT
DRAIN CHANNEL 19F RND (DRAIN) ×3 IMPLANT
DRAPE LAPAROSCOPIC ABDOMINAL (DRAPES) ×3 IMPLANT
DRAPE UTILITY XL STRL (DRAPES) IMPLANT
ELECT REM PT RETURN 9FT ADLT (ELECTROSURGICAL) ×3
ELECTRODE REM PT RTRN 9FT ADLT (ELECTROSURGICAL) ×1 IMPLANT
EVACUATOR SILICONE 100CC (DRAIN) ×3 IMPLANT
GAUZE SPONGE 4X4 12PLY STRL (GAUZE/BANDAGES/DRESSINGS) ×3 IMPLANT
GLOVE SURG SIGNA 7.5 PF LTX (GLOVE) ×3 IMPLANT
GOWN STRL REUS W/ TWL LRG LVL3 (GOWN DISPOSABLE) ×2 IMPLANT
GOWN STRL REUS W/ TWL XL LVL3 (GOWN DISPOSABLE) ×1 IMPLANT
GOWN STRL REUS W/TWL LRG LVL3 (GOWN DISPOSABLE) ×4
GOWN STRL REUS W/TWL XL LVL3 (GOWN DISPOSABLE) ×2
KIT BASIN OR (CUSTOM PROCEDURE TRAY) ×3 IMPLANT
KIT TURNOVER KIT B (KITS) ×3 IMPLANT
NEEDLE 18GX1X1/2 (RX/OR ONLY) (NEEDLE) ×3 IMPLANT
NEEDLE FILTER BLUNT 18X 1/2SAF (NEEDLE)
NEEDLE FILTER BLUNT 18X1 1/2 (NEEDLE) IMPLANT
NEEDLE HYPO 25GX1X1/2 BEV (NEEDLE) ×3 IMPLANT
NS IRRIG 1000ML POUR BTL (IV SOLUTION) ×3 IMPLANT
PACK GENERAL/GYN (CUSTOM PROCEDURE TRAY) ×3 IMPLANT
PAD ARMBOARD 7.5X6 YLW CONV (MISCELLANEOUS) ×6 IMPLANT
SPECIMEN JAR X LARGE (MISCELLANEOUS) ×3 IMPLANT
STAPLER VISISTAT 35W (STAPLE) ×3 IMPLANT
SUT ETHILON 3 0 FSL (SUTURE) ×3 IMPLANT
SUT MON AB 4-0 PC3 18 (SUTURE) ×3 IMPLANT
SUT SILK 2 0 SH (SUTURE) ×3 IMPLANT
SUT VIC AB 3-0 SH 18 (SUTURE) ×6 IMPLANT
SUT VIC AB 3-0 SH 27 (SUTURE)
SUT VIC AB 3-0 SH 27XBRD (SUTURE) IMPLANT
SYR CONTROL 10ML LL (SYRINGE) ×3 IMPLANT
TOWEL OR 17X24 6PK STRL BLUE (TOWEL DISPOSABLE) ×3 IMPLANT
TOWEL OR 17X26 10 PK STRL BLUE (TOWEL DISPOSABLE) ×3 IMPLANT

## 2017-06-11 NOTE — Anesthesia Procedure Notes (Signed)
Anesthesia Regional Block: Pectoralis block   Pre-Anesthetic Checklist: ,, timeout performed, Correct Patient, Correct Site, Correct Laterality, Correct Procedure, Correct Position, site marked, Risks and benefits discussed,  Surgical consent,  Pre-op evaluation,  At surgeon's request and post-op pain management  Laterality: Left  Prep: chloraprep       Needles:  Injection technique: Single-shot  Needle Type: Echogenic Needle     Needle Length: 9cm  Needle Gauge: 21     Additional Needles:   Narrative:  Start time: 06/11/2017 8:43 AM End time: 06/11/2017 8:49 AM Injection made incrementally with aspirations every 5 mL.  Performed by: Personally  Anesthesiologist: Barnet Glasgow, MD

## 2017-06-11 NOTE — Op Note (Signed)
LEFT MASTECTOMY WITH SENTINEL LYMPH NODE BIOPSY  Procedure Note  Breanna [name] 06/11/2017   Pre-op Diagnosis: LEFT BREAST CANCER     Post-op Diagnosis: same  Procedure(s): LEFT MASTECTOMY WITH DEEP LEFT AXILLARY SENTINEL LYMPH NODE BIOPSY  Surgeon(s): Coralie Keens, MD  Anesthesia: General  Staff:  Circulator: Rosanne Sack, RN Scrub Person: Caswell Corwin T  Estimated Blood Loss: Minimal               Specimens: SENT TO PATH  Indications: This is [name] 72 year old female with multifocal left breast cancer.  The decision has been made after meeting with the surgery and the medical oncologist to proceed with [name] mastectomy and sentinel node biopsy  Procedure: The patient was identified in the holding area.  The radiation technologist injected radioactive isotope around the areola of the left breast.  She was taken to the operating room.  She was placed supine on the operating table and general anesthesia was induced.  Her left breast was then prepped and draped in the usual sterile fashion.  I made elliptical incision on the breast going from medial to lateral incorporating the nipple areolar complex.  I then took this down to the breast tissue with electrocautery.  I then used the cautery to create the superior skin flap staying just underneath the skin and going all the way superiorly to just below the clavicle.  I then took this down to the chest wall.  I then dissected the inferior skin flap with electrocautery going down to the inframammary ridge staying underneath the skin as well.  We then took the dissection toward the lateral breast at both flaps going toward the axilla.  I then dissected the breast tissue off of the pectoralis muscle with the cautery moving medial to lateral and past the pectoralis muscles.  I then completed the mastectomy removing the rest of the tissue with the cautery.  I marked the skin at the lateral edge for pathology.  The mastectomy specimen was then sent  to pathology.  We then irrigated with saline.  The neoprobe was brought to the field.  I identified an area of increased uptake in the deep axilla.  I found several matted nodes together which I excised with the electrocautery.  We then again examined the axilla with the neoprobe and found no other increased uptake.  We then irrigated the axilla and mastectomy site further with saline.  Hemostasis appeared to be achieved.  I made an incision with [name] scalpel placed [name] 79 French Blake drain into the axilla and mastectomy site.  This was sewn in place with [name] nylon suture.  I then closed the subtenons tissue with interrupted 3-0 Vicryl sutures and closed the skin with [name] running 4-0 Monocryl.  Dermabond was then applied.  The patient tolerated the procedure well.  All the counts were correct at the end of the procedure.  The patient was then extubated in the operating room and taken in [name] stable condition to the recovery room.          Breanna [name]   Date: 06/11/2017  Time: 12:01 PM

## 2017-06-11 NOTE — Transfer of Care (Signed)
Immediate Anesthesia Transfer of Care Note  Patient: Breanna [name]  Procedure(s) Performed: LEFT MASTECTOMY WITH SENTINEL LYMPH NODE BIOPSY (Left Breast)  Patient Location: PACU  Anesthesia Type:General and regional for post op pain  Level of Consciousness: awake, alert  and oriented  Airway & Oxygen Therapy: Patient Spontanous Breathing and Patient connected to nasal cannula oxygen  Post-op Assessment: Report given to RN and Post -op Vital signs reviewed and stable  Post vital signs: Reviewed and stable  Last Vitals:  Vitals Value Taken Time  BP 148/61 06/11/2017 12:17 PM  Temp    Pulse 72 06/11/2017 12:20 PM  Resp 12 06/11/2017 12:20 PM  SpO2 87 % 06/11/2017 12:20 PM  Vitals shown include unvalidated device data.  Last Pain:  Vitals:   06/11/17 0618  TempSrc: Oral  PainSc:       Patients Stated Pain Goal: 3 (19/50/93 2671)  Complications: No apparent anesthesia complications

## 2017-06-11 NOTE — Interval H&P Note (Signed)
History and Physical Interval Note:no change in H and P  06/11/2017 7:18 AM  Breanna [name]  has presented today for surgery, with the diagnosis of LEFT BREAST CANCER  The various methods of treatment have been discussed with the patient and family. After consideration of risks, benefits and other options for treatment, the patient has consented to  Procedure(s): MASTECTOMY WITH SENTINEL LYMPH NODE BIOPSY (Left) as a surgical intervention .  The patient's history has been reviewed, patient examined, no change in status, stable for surgery.  I have reviewed the patient's chart and labs.  Questions were answered to the patient's satisfaction.     [name] A

## 2017-06-11 NOTE — Anesthesia Procedure Notes (Signed)
Procedure Name: Intubation Date/Time: 06/11/2017 10:49 AM Performed by: Neldon Newport, CRNA Pre-anesthesia Checklist: Timeout performed, Patient being monitored, Suction available, Emergency Drugs available and Patient identified Patient Re-evaluated:Patient Re-evaluated prior to induction Oxygen Delivery Method: Circle system utilized Preoxygenation: Pre-oxygenation with 100% oxygen Induction Type: IV induction Ventilation: Mask ventilation without difficulty Laryngoscope Size: Mac and 3 Grade View: Grade I Tube type: Oral Tube size: 7.0 mm Number of attempts: 1 Placement Confirmation: breath sounds checked- equal and bilateral,  positive ETCO2 and ETT inserted through vocal cords under direct vision Secured at: 22 cm Tube secured with: Tape Dental Injury: Teeth and Oropharynx as per pre-operative assessment

## 2017-06-11 NOTE — Discharge Instructions (Signed)
[facility] °[phone] ° °MASTECTOMY: POST OP INSTRUCTIONS ° °Always review your discharge instruction sheet given to you by the facility where your surgery was performed. °IF YOU HAVE DISABILITY OR FAMILY LEAVE FORMS, YOU MUST BRING THEM TO THE OFFICE FOR PROCESSING.   °DO NOT GIVE THEM TO YOUR DOCTOR. °A prescription for pain medication may be given to you upon discharge.  Take your pain medication as prescribed, if needed.  If narcotic pain medicine is not needed, then you may take acetaminophen (Tylenol) or ibuprofen (Advil) as needed. °1. Take your usually prescribed medications unless otherwise directed. °2. If you need a refill on your pain medication, please contact your pharmacy.  They will contact our office to request authorization.  Prescriptions will not be filled after 5pm or on week-ends. °3. You should follow a light diet the first few days after arrival home, such as soup and crackers, etc.  Resume your normal diet the day after surgery. °4. Most patients will experience some swelling and bruising on the chest and underarm.  Ice packs will help.  Swelling and bruising can take several days to resolve.  °5. It is common to experience some constipation if taking pain medication after surgery.  Increasing fluid intake and taking a stool softener (such as Colace) will usually help or prevent this problem from occurring.  A mild laxative (Milk of Magnesia or Miralax) should be taken according to package instructions if there are no bowel movements after 48 hours. °6. Unless discharge instructions indicate otherwise, leave your bandage dry and in place until your next appointment in 3-5 days.  You may take a limited sponge bath.  No tube baths or showers until the drains are removed.  You may have steri-strips (small skin tapes) in place directly over the incision.  These strips should be left on the skin for 7-10 days.  If your surgeon used skin glue on the incision, you may  shower in 24 hours.  The glue will flake off over the next 2-3 weeks.  Any sutures or staples will be removed at the office during your follow-up visit. °7. DRAINS:  If you have drains in place, it is important to keep a list of the amount of drainage produced each day in your drains.  Before leaving the hospital, you should be instructed on drain care.  Call our office if you have any questions about your drains. °8. ACTIVITIES:  You may resume regular (light) daily activities beginning the next day--such as daily self-care, walking, climbing stairs--gradually increasing activities as tolerated.  You may have sexual intercourse when it is comfortable.  Refrain from any heavy lifting or straining until approved by your doctor. °a. You may drive when you are no longer taking prescription pain medication, you can comfortably wear a seatbelt, and you can safely maneuver your car and apply brakes. °b. RETURN TO WORK:  __________________________________________________________ °9. You should see your doctor in the office for a follow-up appointment approximately 3-5 days after your surgery.  Your doctor’s nurse will typically make your follow-up appointment when she calls you with your pathology report.  Expect your pathology report 2-3 business days after your surgery.  You may call to check if you do not hear from us after three days.   °10. OTHER INSTRUCTIONS: ______________________________________________________________________________________________ ____________________________________________________________________________________________ °WHEN TO CALL YOUR DOCTOR: °1. Fever over 101.0 °2. Nausea and/or vomiting °3. Extreme swelling or bruising °4. Continued bleeding from incision. °5. Increased pain, redness, or drainage from the incision. °  The clinic staff is available to answer your questions during regular business hours.  Please don’t hesitate to call and ask to speak to one of the nurses for clinical  concerns.  If you have a medical emergency, go to the nearest emergency room or call 911.  A surgeon from [facility] is always on call at the hospital. °[address], [address], [place], [place]  [address] ? [address] [address], [place],    [address] °[phone] ? [phone] ? FAX [phone] °Web site: [email or web address] °

## 2017-06-12 ENCOUNTER — Observation Stay (HOSPITAL_COMMUNITY): Payer: Medicare Other | Admitting: Anesthesiology

## 2017-06-12 ENCOUNTER — Encounter (HOSPITAL_COMMUNITY)
Admission: RE | Disposition: A | Discharge status: Home / Self Care | Payer: Self-pay | Source: Ambulatory Visit | Attending: Surgery

## 2017-06-12 DIAGNOSIS — Z881 Allergy status to other antibiotic agents status: Secondary | ICD-10-CM | POA: Diagnosis not present

## 2017-06-12 DIAGNOSIS — Z79899 Other long term (current) drug therapy: Secondary | ICD-10-CM | POA: Diagnosis not present

## 2017-06-12 DIAGNOSIS — Z79890 Hormone replacement therapy (postmenopausal): Secondary | ICD-10-CM | POA: Diagnosis not present

## 2017-06-12 DIAGNOSIS — Z888 Allergy status to other drugs, medicaments and biological substances status: Secondary | ICD-10-CM | POA: Diagnosis not present

## 2017-06-12 DIAGNOSIS — Z87891 Personal history of nicotine dependence: Secondary | ICD-10-CM | POA: Diagnosis not present

## 2017-06-12 DIAGNOSIS — Z8673 Personal history of transient ischemic attack (TIA), and cerebral infarction without residual deficits: Secondary | ICD-10-CM | POA: Diagnosis not present

## 2017-06-12 DIAGNOSIS — I10 Essential (primary) hypertension: Secondary | ICD-10-CM | POA: Diagnosis present

## 2017-06-12 DIAGNOSIS — D62 Acute posthemorrhagic anemia: Secondary | ICD-10-CM | POA: Diagnosis present

## 2017-06-12 DIAGNOSIS — Z9012 Acquired absence of left breast and nipple: Secondary | ICD-10-CM | POA: Diagnosis not present

## 2017-06-12 DIAGNOSIS — Z8249 Family history of ischemic heart disease and other diseases of the circulatory system: Secondary | ICD-10-CM | POA: Diagnosis not present

## 2017-06-12 DIAGNOSIS — Y92230 Patient room in hospital as the place of occurrence of the external cause: Secondary | ICD-10-CM | POA: Diagnosis present

## 2017-06-12 DIAGNOSIS — J302 Other seasonal allergic rhinitis: Secondary | ICD-10-CM | POA: Diagnosis present

## 2017-06-12 DIAGNOSIS — Z7902 Long term (current) use of antithrombotics/antiplatelets: Secondary | ICD-10-CM | POA: Diagnosis not present

## 2017-06-12 DIAGNOSIS — J449 Chronic obstructive pulmonary disease, unspecified: Secondary | ICD-10-CM | POA: Diagnosis present

## 2017-06-12 DIAGNOSIS — Z90710 Acquired absence of both cervix and uterus: Secondary | ICD-10-CM | POA: Diagnosis not present

## 2017-06-12 DIAGNOSIS — Y838 Other surgical procedures as the cause of abnormal reaction of the patient, or of later complication, without mention of misadventure at the time of the procedure: Secondary | ICD-10-CM | POA: Diagnosis present

## 2017-06-12 DIAGNOSIS — L7632 Postprocedural hematoma of skin and subcutaneous tissue following other procedure: Secondary | ICD-10-CM | POA: Diagnosis present

## 2017-06-12 DIAGNOSIS — Z79891 Long term (current) use of opiate analgesic: Secondary | ICD-10-CM | POA: Diagnosis not present

## 2017-06-12 DIAGNOSIS — C50412 Malignant neoplasm of upper-outer quadrant of left female breast: Secondary | ICD-10-CM | POA: Diagnosis present

## 2017-06-12 DIAGNOSIS — F419 Anxiety disorder, unspecified: Secondary | ICD-10-CM | POA: Diagnosis present

## 2017-06-12 DIAGNOSIS — Y813 Surgical instruments, materials and general- and plastic-surgery devices (including sutures) associated with adverse incidents: Secondary | ICD-10-CM | POA: Diagnosis present

## 2017-06-12 DIAGNOSIS — C50812 Malignant neoplasm of overlapping sites of left female breast: Secondary | ICD-10-CM | POA: Diagnosis present

## 2017-06-12 DIAGNOSIS — Z9981 Dependence on supplemental oxygen: Secondary | ICD-10-CM | POA: Diagnosis not present

## 2017-06-12 HISTORY — PX: EVACUATION BREAST HEMATOMA: SHX1537

## 2017-06-12 LAB — CBC
HCT: 26.1 % — ABNORMAL LOW (ref 36.0–46.0)
HCT: 30.7 % — ABNORMAL LOW (ref 36.0–46.0)
Hemoglobin: 8.6 g/dL — ABNORMAL LOW (ref 12.0–15.0)
Hemoglobin: 9.6 g/dL — ABNORMAL LOW (ref 12.0–15.0)
MCH: 31.9 pg (ref 26.0–34.0)
MCH: 30.2 pg (ref 26.0–34.0)
MCHC: 33 g/dL (ref 30.0–36.0)
MCHC: 31.3 g/dL (ref 30.0–36.0)
MCV: 96.7 fL (ref 78.0–100.0)
MCV: 96.5 fL (ref 78.0–100.0)
PLATELETS: 157 10*3/uL (ref 150–400)
PLATELETS: 172 10*3/uL (ref 150–400)
RBC: 2.7 MIL/uL — ABNORMAL LOW (ref 3.87–5.11)
RBC: 3.18 MIL/uL — ABNORMAL LOW (ref 3.87–5.11)
RDW: 13.2 % (ref 11.5–15.5)
RDW: 12.9 % (ref 11.5–15.5)
WBC: 8.4 10*3/uL (ref 4.0–10.5)
WBC: 9.5 10*3/uL (ref 4.0–10.5)

## 2017-06-12 LAB — APTT: aPTT: 25 seconds (ref 24–36)

## 2017-06-12 LAB — TYPE AND SCREEN
ABO/RH(D): O NEG
ANTIBODY SCREEN: NEGATIVE

## 2017-06-12 LAB — PROTIME-INR
INR: 1.04
Prothrombin Time: 13.5 seconds (ref 11.4–15.2)

## 2017-06-12 SURGERY — EVACUATION, HEMATOMA, BREAST
Anesthesia: General | Site: Breast | Laterality: Left

## 2017-06-12 MED ORDER — DEXAMETHASONE SODIUM PHOSPHATE 10 MG/ML IJ SOLN
INTRAMUSCULAR | Status: DC | PRN
  Administered 2017-06-12: 10 mg via INTRAVENOUS

## 2017-06-12 MED ORDER — TRAMADOL HCL 50 MG PO TABS
100.0000 mg | ORAL_TABLET | Freq: Four times a day (QID) | ORAL | Status: DC | PRN
  Administered 2017-06-12: 100 mg via ORAL
  Filled 2017-06-12: qty 2

## 2017-06-12 MED ORDER — CIPROFLOXACIN IN D5W 400 MG/200ML IV SOLN
INTRAVENOUS | Status: AC
  Filled 2017-06-12: qty 200

## 2017-06-12 MED ORDER — PROPOFOL 10 MG/ML IV BOLUS
INTRAVENOUS | Status: DC | PRN
  Administered 2017-06-12: 110 mg via INTRAVENOUS

## 2017-06-12 MED ORDER — LIDOCAINE 2% (20 MG/ML) 5 ML SYRINGE
INTRAMUSCULAR | Status: AC
  Filled 2017-06-12: qty 5

## 2017-06-12 MED ORDER — PHENYLEPHRINE HCL 10 MG/ML IJ SOLN
INTRAVENOUS | Status: DC | PRN
  Administered 2017-06-12: 40 ug/min via INTRAVENOUS

## 2017-06-12 MED ORDER — FENTANYL CITRATE (PF) 100 MCG/2ML IJ SOLN: 25.0000 ug | INTRAMUSCULAR | Status: DC | PRN

## 2017-06-12 MED ORDER — OXYCODONE HCL 5 MG PO TABS: 5.0000 mg | ORAL_TABLET | Freq: Once | ORAL | Status: DC | PRN

## 2017-06-12 MED ORDER — LIDOCAINE HCL (CARDIAC) 20 MG/ML IV SOLN
INTRAVENOUS | Status: DC | PRN
  Administered 2017-06-12: 50 mg via INTRAVENOUS

## 2017-06-12 MED ORDER — ROCURONIUM BROMIDE 100 MG/10ML IV SOLN
INTRAVENOUS | Status: DC | PRN
  Administered 2017-06-12: 40 mg via INTRAVENOUS

## 2017-06-12 MED ORDER — OXYCODONE HCL 5 MG/5ML PO SOLN: 5.0000 mg | Freq: Once | ORAL | Status: DC | PRN

## 2017-06-12 MED ORDER — CIPROFLOXACIN IN D5W 400 MG/200ML IV SOLN
400.0000 mg | Freq: Two times a day (BID) | INTRAVENOUS | Status: DC
  Administered 2017-06-12: 400 mg via INTRAVENOUS

## 2017-06-12 MED ORDER — 0.9 % SODIUM CHLORIDE (POUR BTL) OPTIME
TOPICAL | Status: DC | PRN
  Administered 2017-06-12: 2000 mL

## 2017-06-12 MED ORDER — CHLORHEXIDINE GLUCONATE CLOTH 2 % EX PADS
6.0000 | MEDICATED_PAD | Freq: Once | CUTANEOUS | Status: AC
  Administered 2017-06-12: 6 via TOPICAL

## 2017-06-12 MED ORDER — SUGAMMADEX SODIUM 200 MG/2ML IV SOLN
INTRAVENOUS | Status: DC | PRN
  Administered 2017-06-12: 100 mg via INTRAVENOUS

## 2017-06-12 MED ORDER — LIDOCAINE HCL (CARDIAC) 20 MG/ML IV SOLN
INTRAVENOUS | Status: DC | PRN
  Administered 2017-06-12: 50 mg via INTRATRACHEAL

## 2017-06-12 MED ORDER — ACETAMINOPHEN 325 MG PO TABS
325.0000 mg | ORAL_TABLET | Freq: Four times a day (QID) | ORAL | Status: DC | PRN
Start: 2017-06-12 — End: 2017-06-13
  Administered 2017-06-12 – 2017-06-13 (×2): 325 mg via ORAL
  Filled 2017-06-12 (×2): qty 1

## 2017-06-12 MED ORDER — CHLORHEXIDINE GLUCONATE CLOTH 2 % EX PADS: 6.0000 | MEDICATED_PAD | Freq: Once | CUTANEOUS | Status: DC

## 2017-06-12 MED ORDER — LACTATED RINGERS IV SOLN
INTRAVENOUS | Status: DC | PRN
  Administered 2017-06-12 (×2): via INTRAVENOUS

## 2017-06-12 MED ORDER — PROPOFOL 10 MG/ML IV BOLUS
INTRAVENOUS | Status: AC
  Filled 2017-06-12: qty 20

## 2017-06-12 MED ORDER — BUPIVACAINE-EPINEPHRINE (PF) 0.5% -1:200000 IJ SOLN
INTRAMUSCULAR | Status: AC
  Filled 2017-06-12: qty 30

## 2017-06-12 MED ORDER — SODIUM CHLORIDE 0.9 % IR SOLN
Status: DC | PRN
  Administered 2017-06-12: 3000 mL

## 2017-06-12 MED ORDER — CEFAZOLIN SODIUM-DEXTROSE 2-4 GM/100ML-% IV SOLN
2.0000 g | INTRAVENOUS | Status: DC
  Filled 2017-06-12: qty 100

## 2017-06-12 MED ORDER — FENTANYL CITRATE (PF) 100 MCG/2ML IJ SOLN
INTRAMUSCULAR | Status: DC | PRN
  Administered 2017-06-12 (×3): 50 ug via INTRAVENOUS

## 2017-06-12 MED ORDER — FENTANYL CITRATE (PF) 250 MCG/5ML IJ SOLN
INTRAMUSCULAR | Status: AC
Start: 2017-06-12 — End: ?
  Filled 2017-06-12: qty 5

## 2017-06-12 MED ORDER — ONDANSETRON HCL 4 MG/2ML IJ SOLN
INTRAMUSCULAR | Status: DC | PRN
  Administered 2017-06-12: 4 mg via INTRAVENOUS

## 2017-06-12 SURGICAL SUPPLY — 36 items
APPLIER CLIP 11 MED OPEN (CLIP)
BENZOIN TINCTURE PRP APPL 2/3 (GAUZE/BANDAGES/DRESSINGS) IMPLANT
BINDER BREAST LRG (GAUZE/BANDAGES/DRESSINGS) IMPLANT
BINDER BREAST XXLRG (GAUZE/BANDAGES/DRESSINGS) ×3 IMPLANT
BLADE HEX COATED 2.75 (ELECTRODE) ×3 IMPLANT
CANISTER SUCT 3000ML PPV (MISCELLANEOUS) IMPLANT
CLIP APPLIE 11 MED OPEN (CLIP) IMPLANT
CLOSURE WOUND 1/2 X4 (GAUZE/BANDAGES/DRESSINGS)
DRAIN CHANNEL 19F RND (DRAIN) ×6 IMPLANT
DRAPE LAPAROSCOPIC ABDOMINAL (DRAPES) IMPLANT
DRSG PAD ABDOMINAL 8X10 ST (GAUZE/BANDAGES/DRESSINGS) IMPLANT
ELECT REM PT RETURN 9FT ADLT (ELECTROSURGICAL) ×3
ELECTRODE REM PT RTRN 9FT ADLT (ELECTROSURGICAL) ×1 IMPLANT
EVACUATOR SILICONE 100CC (DRAIN) ×6 IMPLANT
GAUZE SPONGE 4X4 12PLY STRL (GAUZE/BANDAGES/DRESSINGS) ×3 IMPLANT
GAUZE XEROFORM 5X9 LF (GAUZE/BANDAGES/DRESSINGS) ×3 IMPLANT
GLOVE BIOGEL PI IND STRL 7.0 (GLOVE) ×1 IMPLANT
GLOVE BIOGEL PI INDICATOR 7.0 (GLOVE) ×2
GLOVE EUDERMIC 7 POWDERFREE (GLOVE) ×3 IMPLANT
GOWN STRL REUS W/TWL XL LVL3 (GOWN DISPOSABLE) ×6 IMPLANT
HANDPIECE INTERPULSE COAX TIP (DISPOSABLE) ×2
KIT BASIN OR (CUSTOM PROCEDURE TRAY) ×3 IMPLANT
MARKER SKIN DUAL TIP RULER LAB (MISCELLANEOUS) IMPLANT
NEEDLE HYPO 22GX1.5 SAFETY (NEEDLE) IMPLANT
NS IRRIG 1000ML POUR BTL (IV SOLUTION) ×6 IMPLANT
PACK GENERAL/GYN (CUSTOM PROCEDURE TRAY) ×3 IMPLANT
PAD ABD 8X10 STRL (GAUZE/BANDAGES/DRESSINGS) ×3 IMPLANT
SET HNDPC FAN SPRY TIP SCT (DISPOSABLE) ×1 IMPLANT
SPONGE DRAIN TRACH 4X4 STRL 2S (GAUZE/BANDAGES/DRESSINGS) IMPLANT
STAPLER VISISTAT 35W (STAPLE) ×9 IMPLANT
STRIP CLOSURE SKIN 1/2X4 (GAUZE/BANDAGES/DRESSINGS) IMPLANT
SUT ETHILON 3 0 PS 1 (SUTURE) ×6 IMPLANT
SUT MNCRL AB 4-0 PS2 18 (SUTURE) IMPLANT
SUT VIC AB 3-0 SH 18 (SUTURE) IMPLANT
SYR CONTROL 10ML LL (SYRINGE) IMPLANT
TOWEL OR 17X26 10 PK STRL BLUE (TOWEL DISPOSABLE) ×3 IMPLANT

## 2017-06-12 NOTE — Op Note (Addendum)
Patient Name:           Breanna [name]   Date of Surgery:        06/12/2017  Pre op Diagnosis:      Acute hematoma left mastectomy wound  Post op Diagnosis:    Same  Procedure:                 Evacuation left mastectomy wound hematoma, control of bleeders  Surgeon:                     Edsel Petrin. Dalbert Batman, M.D., FACS  Assistant:                      OR staff  Operative Indications:   This is a 72 year old female who underwent left total mastectomy and sentinel node biopsy 24 hours ago by Dr. Mercy Riding for multifocal cancer of the left breast..  The surgery was uneventful.  She takes Plavix chronically due to past history of TIA.  But that was discontinued several days ago.  She is on home oxygen.  [name] and there was a large but soft hematoma of the mastectomy wound.  Blood work showed that her hemoglobin had fallen from 13.0 to 8.6.  She is brought to the operating room for exploration of her left mastectomy wound and evacuation of hematoma.  Operative Findings:       There was a significant clotted hematoma in the left mastectomy wound and some liquid blood.  There were numerous and diffuse tiny bleeders but no large areas of hemorrhage.  All of the bleeders were cauterized and the wound was irrigated and appeared hemostatic.  2 drains were placed  Procedure in Detail:          Following the induction of general LMA anesthesia the Dermabond was removed from her mastectomy incision.  The single lateral drain was removed.  The entire left chest wall was prepped and draped in a sterile fashion.  Surgical timeout was performed.  Intravenous antibiotics were given.  Using a knife I opened the incision and removed some of the Vicryl and Monocryl sutures.      I evacuated all the hematoma and liquid blood.  I spent a long time going around the circumference of the wound cauterizing bleeders.  I cleaned up the wound with a  pulsatile irrigator.  I then cauterized further small oozing points until it all seemed clean.  I irrigated extensively again.  I was satisfied with hemostasis.  Worley drains were placed, one up into the left axillary area and one across the skin flaps.  These were brought out through 2 separate stab incisions, sutured to the skin and connected to suction bulbs.  The skin was closed with skin staples.  The drains did not appear to be draining very much at all.  The wound was cleansed, covered with Xeroform gauze, 4 x 4's, ABDs, and a breast binder.  The patient tolerated the procedure well and was taken to PACU in stable condition.  Estimated blood evacuated 750 cc.  Acute blood loss 25 cc.  Counts correct.  Complications none.     Edsel Petrin. Dalbert Batman, M.D., [facility] General and Minimally invasive Surgery Breast and Colorectal Surgery Office:   [phone] Pager:   [phone]   06/12/2017 10:44 AM

## 2017-06-12 NOTE — Anesthesia Postprocedure Evaluation (Signed)
Anesthesia Post Note  Patient: Breanna [name]  Procedure(s) Performed: EVACUATION HEMATOMA BREAST(POST MASTECTOMY) (Left Breast)     Patient location during evaluation: PACU Anesthesia Type: General Level of consciousness: awake and alert Pain management: pain level controlled Vital Signs Assessment: post-procedure vital signs reviewed and stable Respiratory status: spontaneous breathing, nonlabored ventilation, respiratory function stable and patient connected to nasal cannula oxygen Cardiovascular status: blood pressure returned to baseline and stable Postop Assessment: no apparent nausea or vomiting Anesthetic complications: no    Last Vitals:  Vitals:   06/12/17 1150 06/12/17 1205  BP: 118/72 123/62  Pulse: 70 67  Resp: 17 17  Temp: (!) 36.4 C 36.4 C  SpO2: 97% 96%    Last Pain:  Vitals:   06/12/17 1205  TempSrc: Oral  PainSc:                  [name]

## 2017-06-12 NOTE — Progress Notes (Signed)
1 Day Post-Op  Subjective: Alert.  Oriented stable.  Pleasant. Pain well controlled Ambulating to bathroom and voiding without difficulty.  No nausea or vomiting.  Patient states single drainage bulb filling up frequently.  Output 405 cc.  CBC pending.   Objective: Vital signs in last 24 hours: Temp:  [97.7 F (36.5 C)-98.4 F (36.9 C)] 98.3 F (36.8 C) (04/06 0419) Pulse Rate:  [67-90] 74 (04/06 0419) Resp:  [11-19] 16 (04/06 0419) BP: (124-148)/(53-79) 125/53 (04/06 0419) SpO2:  [86 %-98 %] 92 % (04/06 0419) Weight:  [97.1 kg (214 lb)] 97.1 kg (214 lb) (04/06 0156) Last BM Date: 06/11/17  Intake/Output from previous day: 04/05 0701 - 04/06 0700 In: 3433.8 [I.V.:2533.8] Out: 425 [Drains:405; Blood:20] Intake/Output this shift: Total I/O In: 1800 [I.V.:900; Other:900] Out: 350 [Drains:350]  General appearance: Alert.  Pleasant.  No distress. Resp: clear to auscultation bilaterally Breasts:Left mastectomy wound skin intact.  Lots of ecchymoses and seems to have a significant hematoma, although not tense or tender.  Bloody drainage in the bulb.  Lab Results:  No results found for this or any previous visit (from the past 24 hour(s)).   Studies/Results: Nm Sentinel Node Inj-no Rpt (breast)  Result Date: 06/11/2017 Sulfur colloid was injected by the nuclear medicine technologist for melanoma sentinel node.    Marland Kitchen amLODipine  5 mg Oral Daily  . carvedilol  25 mg Oral BID WC  . enoxaparin (LOVENOX) injection  40 mg Subcutaneous Q24H  . hydrochlorothiazide  25 mg Oral Daily  . lisinopril  40 mg Oral Daily  . montelukast  10 mg Oral Daily  . PARoxetine  10 mg Oral QPM  . venlafaxine XR  37.5 mg Oral Q breakfast     Assessment/Plan: s/p Procedure(s): LEFT MASTECTOMY WITH SENTINEL LYMPH NODE BIOPSY   POD #1.  Left mastectomy and sentinel node biopsy -Seems to have significant left mastectomy hematoma -N.p.o. -Check CBC -Likely return to OR today for evacuation of  hematoma and replacement of drains. -I explained this to patient, and although disappointed, she is very understanding.  Will check CBC and return to OR this morning.  I explained this in detail the patient with indications, techniques, and risks.  Hypertension Oxygen dependent Plavix at home-hx TIA  @PROBHOSP @  LOS: 0 days    Adin Hector 06/12/2017  . .prob

## 2017-06-12 NOTE — Transfer of Care (Signed)
Immediate Anesthesia Transfer of Care Note  Patient: Breanna [name]  Procedure(s) Performed: EVACUATION HEMATOMA BREAST(POST MASTECTOMY) (Left Breast)  Patient Location: PACU  Anesthesia Type:General  Level of Consciousness: awake, alert  and patient cooperative  Airway & Oxygen Therapy: Patient Spontanous Breathing and Patient connected to nasal cannula oxygen  Post-op Assessment: Report given to RN, Post -op Vital signs reviewed and stable and Patient moving all extremities  Post vital signs: Reviewed and stable  Last Vitals:  Vitals Value Taken Time  BP 124/55 06/12/2017 11:02 AM  Temp    Pulse 71 06/12/2017 11:03 AM  Resp 16 06/12/2017 11:03 AM  SpO2 96 % 06/12/2017 11:03 AM  Vitals shown include unvalidated device data.  Last Pain:  Vitals:   06/12/17 0903  TempSrc:   PainSc: 0-No pain      Patients Stated Pain Goal: 3 (57/[id number])  Complications: No apparent anesthesia complications

## 2017-06-12 NOTE — Anesthesia Preprocedure Evaluation (Signed)
Anesthesia Evaluation  Patient identified by MRN, date of birth, ID band Patient awake    Reviewed: Allergy & Precautions, NPO status , Patient's Chart, lab work & pertinent test results, reviewed documented beta blocker date and time   History of Anesthesia Complications Negative for: history of anesthetic complications  Airway Mallampati: II  TM Distance: >3 FB Neck ROM: Full    Dental  (+) Teeth Intact   Pulmonary shortness of breath and Long-Term Oxygen Therapy, COPD, former smoker,    breath sounds clear to auscultation       Cardiovascular Exercise Tolerance: Good hypertension, Pt. on medications and Pt. on home beta blockers Normal cardiovascular exam Rhythm:Regular Rate:Normal     Neuro/Psych Anxiety TIA Neuromuscular disease CVA    GI/Hepatic Neg liver ROS, hiatal hernia, GERD  Medicated and Controlled,  Endo/Other  negative endocrine ROSMorbid obesity  Renal/GU negative Renal ROS     Musculoskeletal  (+) Arthritis ,   Abdominal   Peds  Hematology   Anesthesia Other Findings   Reproductive/Obstetrics                             Anesthesia Physical Anesthesia Plan  ASA: III  Anesthesia Plan: General   Post-op Pain Management:    Induction: Intravenous  PONV Risk Score and Plan: 3 and Ondansetron and Dexamethasone  Airway Management Planned: LMA and Oral ETT  Additional Equipment: None  Intra-op Plan:   Post-operative Plan: Extubation in OR  Informed Consent: I have reviewed the patients History and Physical, chart, labs and discussed the procedure including the risks, benefits and alternatives for the proposed anesthesia with the patient or authorized representative who has indicated his/her understanding and acceptance.   Dental advisory given  Plan Discussed with: CRNA and Surgeon  Anesthesia Plan Comments:         Anesthesia Quick Evaluation

## 2017-06-12 NOTE — Anesthesia Procedure Notes (Signed)
Procedure Name: Intubation Date/Time: 06/12/2017 9:51 AM Performed by: Izora Gala, CRNA Pre-anesthesia Checklist: Patient identified, Emergency Drugs available and Suction available Patient Re-evaluated:Patient Re-evaluated prior to induction Oxygen Delivery Method: Circle system utilized Preoxygenation: Pre-oxygenation with 100% oxygen Induction Type: IV induction Ventilation: Mask ventilation without difficulty Laryngoscope Size: Miller and 3 Grade View: Grade II Tube type: Oral Tube size: 7.0 mm Number of attempts: 1 Airway Equipment and Method: Stylet and LTA kit utilized Placement Confirmation: ETT inserted through vocal cords under direct vision,  positive ETCO2 and breath sounds checked- equal and bilateral Secured at: 22 cm Tube secured with: Tape Dental Injury: Teeth and Oropharynx as per pre-operative assessment

## 2017-06-13 ENCOUNTER — Encounter (HOSPITAL_COMMUNITY): Payer: Self-pay | Admitting: General Surgery

## 2017-06-13 LAB — CBC
HCT: 30 % — ABNORMAL LOW (ref 36.0–46.0)
Hemoglobin: 9.3 g/dL — ABNORMAL LOW (ref 12.0–15.0)
MCH: 30.2 pg (ref 26.0–34.0)
MCHC: 31 g/dL (ref 30.0–36.0)
MCV: 97.4 fL (ref 78.0–100.0)
PLATELETS: 203 10*3/uL (ref 150–400)
RBC: 3.08 MIL/uL — AB (ref 3.87–5.11)
RDW: 13.2 % (ref 11.5–15.5)
WBC: 11.3 10*3/uL — AB (ref 4.0–10.5)

## 2017-06-13 LAB — BASIC METABOLIC PANEL
ANION GAP: 9 (ref 5–15)
BUN: 12 mg/dL (ref 6–20)
CALCIUM: 8.4 mg/dL — AB (ref 8.9–10.3)
CHLORIDE: 104 mmol/L (ref 101–111)
CO2: 29 mmol/L (ref 22–32)
CREATININE: 0.64 mg/dL (ref 0.44–1.00)
GFR calc Af Amer: >60 mL/min (ref >60)
GFR calc non Af Amer: >60 mL/min (ref >60)
Glucose, Bld: 159 mg/dL — ABNORMAL HIGH (ref 65–99)
Potassium: 4 mmol/L (ref 3.5–5.1)
SODIUM: 142 mmol/L (ref 135–145)

## 2017-06-13 MED ORDER — FERROUS SULFATE 300 (60 FE) MG/5ML PO SYRP: 300.0000 mg | ORAL_SOLUTION | Freq: Every day | ORAL | 1 refills | Status: DC

## 2017-06-13 MED ORDER — TRAMADOL HCL 50 MG PO TABS: 100.0000 mg | ORAL_TABLET | Freq: Four times a day (QID) | ORAL | 0 refills | Status: DC | PRN

## 2017-06-13 NOTE — Discharge Summary (Signed)
Patient ID: Breanna [name] [id number] 72 y.o. 1946-03-06  Admit date: 06/11/2017  Discharge date and time: 06/13/2017  Admitting Physician: Coralie Keens  Discharge Physician: Adin Hector  Admission Diagnoses: LEFT BREAST CANCER  Discharge Diagnoses: Left breast cancer                                         Hematoma left mastectomy wound                                          Acute blood loss anemia                                          History TIA-anticoagulated on Plavix                                          Oxygen dependent COPD                                           Hypertension  Operations: Procedure(s): Left total mastectomy and left axillary deep sentinel lymph node biopsy  06/11/2017 EVACUATION HEMATOMA BREAST(POST MASTECTOMY)-06/12/2017  Admission Condition: good  Discharged Condition: good  Indication for Admission: This is a 72 year old female with multifocal left breast cancer.  The decision has been made after meeting with the surgery and the medical oncologist to proceed with a mastectomy and sentinel node biopsy.  She has discontinued her Plavix 5 days preop    Hospital Course: On the day of admission the patient was taken to the operating room and underwent elective left total mastectomy and left deep axillary sentinel lymph node biopsy.  The surgery was uneventful     Overnight the patient remained stable but had lots of drainage from her JP drain.  Left mastectomy hematoma was obvious on physical exam on rounds early in the morning postop day 1.  Hemoglobin had fallen from 13.0-8.6.  She was taken to the operating room at that point in time and the mastectomy incision was opened.  The hematoma was evacuated.  Tiny bleeders were cauterized extensively.  The wound was closed with 2 drains.     She remained stable thereafter and had minimal bleeding.  INR and PTT were normal postop.  Hemoglobin stabilized at 9.3 without transfusion. On postop day 1 she  was perfectly comfortable and stable   and was begging to go home.  The wound looked good and was completely flat without hematoma.  The drainage had subsided significantly.  Hemoglobin was 9.3.  SPO2 was 98% on 1.5 L nasal O2 which she uses at home.      I felt that she met discharge criteria and that it was safe for discharge.  She was given a prescription for tramadol and iron sulfate syrup.  Wound care and drain care  education was performed by the nursing staff.     She was advised that she has stent skin staples which need to come out in 10-14 days.  She was advised to make an appointment with Dr. Ninfa Linden in 10-14 days.    Pathology is pending  Consults: None  Significant Diagnostic Studies: Surgical pathology  Treatments: surgery: Left mastectomy with sentinel node biopsy, return to operating room for evacuation hematoma  Disposition: Home  Patient Instructions:    Activity: No driving.  No strenuous activities.  Ambulate as much as possible Diet: low fat, low cholesterol diet Wound Care: as directed  Follow-up:  With Dr. Mercy Riding in 10-14 days .  Signed: Edsel Petrin. Dalbert Batman, M.D., FACS General and minimally invasive surgery Breast and Colorectal Surgery  06/13/2017, 9:38 AM

## 2017-06-14 ENCOUNTER — Encounter (HOSPITAL_COMMUNITY): Payer: Self-pay | Admitting: Surgery

## 2017-06-14 NOTE — Anesthesia Postprocedure Evaluation (Signed)
Anesthesia Post Note  Patient: Breanna [name]  Procedure(s) Performed: LEFT MASTECTOMY WITH SENTINEL LYMPH NODE BIOPSY (Left Breast)     Patient location during evaluation: PACU Anesthesia Type: Regional and General Level of consciousness: awake and alert Pain management: pain level controlled Vital Signs Assessment: post-procedure vital signs reviewed and stable Respiratory status: spontaneous breathing, nonlabored ventilation, respiratory function stable and patient connected to nasal cannula oxygen Cardiovascular status: blood pressure returned to baseline and stable Postop Assessment: no apparent nausea or vomiting Anesthetic complications: no    Last Vitals:  Vitals:   06/13/17 0956 06/13/17 1000  BP:  (!) 168/70  Pulse:  91  Resp:  18  Temp:  36.9 C  SpO2: 90% 96%    Last Pain:  Vitals:   06/13/17 1251  TempSrc:   PainSc: 0-No pain                 Barnet Glasgow

## 2017-06-15 NOTE — Assessment & Plan Note (Addendum)
05/13/17: [facility] biopsy: Left breast biopsy 2 o'clock position 8 cm from nipple ultrasound-guided biopsy: Grade 2 IDC, ER 50%, PR 3%, HER-2 equivocal Ki-67 15% nipple area biopsy grade 2 IDC, ER 100% positive, PR negative, HER-2 equivocal, Ki-67 40%  06/11/17: Left Mastectomy: 2 foci of IDC 2.2 and 1 cm Grade 2, 1/2 LN Positive; Grade 2 IDC, ER 50%, PR 3%, HER-2 equivocal Ki-67 15% nipple area biopsy grade 2 IDC, ER 100% positive, PR negative, HER-2 equivocal, Ki-67 40% T2N1a Stage 1B   Plan:  1.Oncotype DX testing to determine if she would benefit from systemic chemotherapy  2.  Followed by adjuvant antiestrogen therapy

## 2017-06-16 ENCOUNTER — Telehealth: Payer: Self-pay | Admitting: *Deleted

## 2017-06-16 ENCOUNTER — Inpatient Hospital Stay: Payer: Medicare Other | Attending: Hematology and Oncology | Admitting: Hematology and Oncology

## 2017-06-16 DIAGNOSIS — C50412 Malignant neoplasm of upper-outer quadrant of left female breast: Secondary | ICD-10-CM | POA: Diagnosis present

## 2017-06-16 DIAGNOSIS — Z79899 Other long term (current) drug therapy: Secondary | ICD-10-CM

## 2017-06-16 DIAGNOSIS — Z9012 Acquired absence of left breast and nipple: Secondary | ICD-10-CM | POA: Diagnosis not present

## 2017-06-16 DIAGNOSIS — Z170 Estrogen receptor positive status [ER+]: Secondary | ICD-10-CM | POA: Insufficient documentation

## 2017-06-16 NOTE — Telephone Encounter (Signed)
Received order for mammaprint testing. Requisition faxed to pathology. Received by Keisha. 

## 2017-06-16 NOTE — Progress Notes (Signed)
Patient Care Team: Sherrilee Gilles, DO as PCP - General (Family Medicine)  DIAGNOSIS:  Encounter Diagnosis  Name Primary?  . Malignant neoplasm of upper-outer quadrant of left breast in female, estrogen receptor positive ([place])     SUMMARY OF ONCOLOGIC HISTORY:   Malignant neoplasm of upper-outer quadrant of left breast in female, estrogen receptor positive ([place])   05/13/2017 Initial Diagnosis    [place] biopsy: Left breast biopsy 2 o'clock position 8 cm from nipple ultrasound-guided biopsy: Grade 2 IDC, ER 50%, PR 3%, HER-2 equivocal Ki-67 15% nipple area biopsy grade 2 IDC, ER 100% positive, PR negative, HER-2 equivocal, Ki-67 40%      05/31/2017 Breast MRI    Left breast UOQ 230 position: 2 x 1.9 x 1.9 cm mass, second mass left breast UOQ 230 position: 1 x 1 x 0.7 cm, 2 more enhancing adjacent 3 and 5 mm nodules slightly superiorly midway between the 2 masses.  No abnormal lymph nodes, right breast normal      06/11/2017 Surgery    Left Mastectomy: 2 foci of IDC 2.2 and 1 cm Grade 2, 1/2 LN Positive; Grade 2 IDC, ER 50%, PR 3%, HER-2 equivocal Ki-67 15% nipple area biopsy grade 2 IDC, ER 100% positive, PR negative, HER-2 equivocal, Ki-67 40% T2N1a Stage 1B       CHIEF COMPLIANT: Follow-up after left mastectomy  INTERVAL HISTORY: Breanna [name] is a 72 year old with above-mentioned his left breast cancer diagnosed in [place].  She underwent left mastectomy and is here today to discuss pathology report.  She was noted to have 2 foci of invasive ductal carcinoma that were both ER PR positive HER-2 negative.  She is healing and recovering well from recent surgery.  She still has drains in place.  She also has discomfort in the chest wall.  She still uses oxygen she does not know why she is short of breath without the oxygen.  Apparently pulmonary and cardiology did not find any abnormalities in her lungs or the heart function.  REVIEW OF SYSTEMS:   Constitutional: Denies  fevers, chills or abnormal weight loss Eyes: Denies blurriness of vision Ears, nose, mouth, throat, and face: Denies mucositis or sore throat Respiratory: O2 by nasal cannula Cardiovascular: Denies palpitation, chest discomfort Gastrointestinal:  Denies nausea, heartburn or change in bowel habits Skin: Denies abnormal skin rashes Lymphatics: Denies new lymphadenopathy or easy bruising Neurological:Denies numbness, tingling or new weaknesses Behavioral/Psych: Mood is stable, no new changes  Extremities: No lower extremity edema Breast: Recent left mastectomy All other systems were reviewed with the patient and are negative.  I have reviewed the past medical history, past surgical history, social history and family history with the patient and they are unchanged from previous note.  ALLERGIES:  is allergic to atorvastatin; ancef [cefazolin]; lactose; mometasone; nasonex [mometasone furoate]; and neurontin [gabapentin].  MEDICATIONS:  Current Outpatient Medications  Medication Sig Dispense Refill  . acetaminophen (TYLENOL) 500 MG tablet Take 500 mg by mouth every 6 (six) hours as needed for moderate pain or headache.     Marland Kitchen amLODipine (NORVASC) 5 MG tablet Take 5 mg by mouth daily.    . carvedilol (COREG) 25 MG tablet Take 25 mg by mouth 2 (two) times daily with a meal.    . cetirizine (ZYRTEC) 10 MG tablet Take 10 mg by mouth daily.    . clopidogrel (PLAVIX) 75 MG tablet Take 75 mg by mouth daily.    . ferrous sulfate 300 (60 Fe) MG/5ML syrup  Take 5 mLs (300 mg total) by mouth daily. 150 mL 1  . fluticasone (FLONASE) 50 MCG/ACT nasal spray Place 1 spray into both nostrils daily as needed for allergies.     . hydrochlorothiazide (HYDRODIURIL) 25 MG tablet Take 25 mg by mouth daily.    . lansoprazole (PREVACID) 15 MG capsule Take 15 mg by mouth daily.     Marland Kitchen lisinopril (PRINIVIL,ZESTRIL) 40 MG tablet Take 40 mg by mouth daily.    . montelukast (SINGULAIR) 10 MG tablet Take 10 mg by mouth  daily.    Marland Kitchen oxyCODONE (OXY IR/ROXICODONE) 5 MG immediate release tablet Take 1-2 tablets (5-10 mg total) by mouth every 6 (six) hours as needed for moderate pain, severe pain or breakthrough pain. 25 tablet 0  . PARoxetine (PAXIL) 10 MG tablet Take 10 mg by mouth every evening.     Vladimir Faster Glycol-Propyl Glycol (SYSTANE OP) Place 1 drop into both eyes daily as needed (for dry eyes).     . potassium chloride (K-DUR,KLOR-CON) 10 MEQ tablet Take 10 mEq by mouth daily.     . traMADol (ULTRAM) 50 MG tablet Take 50 mg by mouth every 6 (six) hours as needed for moderate pain.     . traMADol (ULTRAM) 50 MG tablet Take 2 tablets (100 mg total) by mouth every 6 (six) hours as needed (mild pain). 20 tablet 0  . venlafaxine XR (EFFEXOR-XR) 37.5 MG 24 hr capsule Take 1 capsule (37.5 mg total) by mouth daily with breakfast. 30 capsule 3   No current facility-administered medications for this visit.     PHYSICAL EXAMINATION: ECOG PERFORMANCE STATUS: 1 - Symptomatic but completely ambulatory  Vitals:   06/16/17 1525  BP: (!) 124/56  Pulse: 72  Resp: 18  Temp: 98 F (36.7 C)  SpO2: 99%   Filed Weights   06/16/17 1525  Weight: 216 lb 6.4 oz (98.2 kg)    GENERAL:alert, no distress and comfortable SKIN: skin color, texture, turgor are normal, no rashes or significant lesions EYES: normal, Conjunctiva are pink and non-injected, sclera clear OROPHARYNX:no exudate, no erythema and lips, buccal mucosa, and tongue normal  NECK: supple, thyroid normal size, non-tender, without nodularity LYMPH:  no palpable lymphadenopathy in the cervical, axillary or inguinal LUNGS: clear to auscultation and percussion with normal breathing effort HEART: regular rate & rhythm and no murmurs and no lower extremity edema ABDOMEN:abdomen soft, non-tender and normal bowel sounds MUSCULOSKELETAL:no cyanosis of digits and no clubbing  NEURO: alert & oriented x 3 with fluent speech, no focal motor/sensory  deficits EXTREMITIES: No lower extremity edema  LABORATORY DATA:  I have reviewed the data as listed CMP Latest Ref Rng & Units 06/13/2017 06/09/2017 09/18/2016  Glucose 65 - 99 mg/dL 159(H) 97 111(H)  BUN 6 - 20 mg/dL _0 Creatinine 0.44 - 1.00 mg/dL 0.64 0.61 0.57  Sodium 135 - 145 mmol/L 142 137 139  Potassium 3.5 - 5.1 mmol/L 4.0 3.6 3.4(L)  Chloride 101 - 111 mmol/L 104 100(L) 103  CO2 22 - 32 mmol/L _1 Calcium 8.9 - 10.3 mg/dL 8.4(L) 9.1 8.8(L)  Total Protein 6.5 - 8.1 g/dL - - 6.1(L)  Total Bilirubin 0.3 - 1.2 mg/dL - - 0.4  Alkaline Phos 38 - 126 U/L - - 77  AST 15 - 41 U/L - - 30  ALT 14 - 54 U/L - - 25    Lab Results  Component Value Date   WBC 11.3 (H) 06/13/2017  HGB 9.3 (L) 06/13/2017   HCT 30.0 (L) 06/13/2017   MCV 97.4 06/13/2017   PLT 203 06/13/2017   NEUTROABS 2.1 09/18/2016    ASSESSMENT & PLAN:  Malignant neoplasm of upper-outer quadrant of left breast in female, estrogen receptor positive ([place]) 05/13/17: [facility] biopsy: Left breast biopsy 2 o'clock position 8 cm from nipple ultrasound-guided biopsy: Grade 2 IDC, ER 50%, PR 3%, HER-2 equivocal Ki-67 15% nipple area biopsy grade 2 IDC, ER 100% positive, PR negative, HER-2 equivocal, Ki-67 40%  06/11/17: Left Mastectomy: 2 foci of IDC 2.2 and 1 cm Grade 2, 1/2 LN Positive; Grade 2 IDC, ER 50%, PR 3%, HER-2 equivocal Ki-67 15% nipple area biopsy grade 2 IDC, ER 100% positive, PR negative, HER-2 equivocal, Ki-67 40% T2N1a Stage 1B  I discussed with her that given the fact that she has lymph node positive disease, this is clinically high risk disease.  Plan:  1.Mammaprint testing to determine if she would benefit from systemic chemotherapy 2. Adjuvant radiation therapy 3  Followed by adjuvant antiestrogen therapy  Decide on the adjuvant treatment plan. I will call the patient with results of Mammaprint testing and then decide on the adjuvant treatment plan  No orders of the defined types were  placed in this encounter.  The patient has a good understanding of the overall plan. she agrees with it. she will call with any problems that may develop before the next visit here.   Harriette Ohara, MD 06/16/17

## 2017-06-17 ENCOUNTER — Telehealth: Payer: Self-pay | Admitting: Hematology and Oncology

## 2017-06-17 NOTE — Telephone Encounter (Signed)
No 4/10 los.

## 2017-06-28 ENCOUNTER — Telehealth: Payer: Self-pay | Admitting: *Deleted

## 2017-06-28 NOTE — Telephone Encounter (Signed)
Received Mammaprint score of High Risk. Physician team notified. Called pt with results and scheduled appt with Dr. Lindi Adie on 5/2 at 1:30. Denies further questions at this time.

## 2017-06-30 ENCOUNTER — Encounter (HOSPITAL_COMMUNITY): Payer: Self-pay

## 2017-07-08 ENCOUNTER — Telehealth: Payer: Self-pay | Admitting: Hematology and Oncology

## 2017-07-08 ENCOUNTER — Other Ambulatory Visit: Payer: Self-pay

## 2017-07-08 ENCOUNTER — Inpatient Hospital Stay: Payer: Medicare Other | Attending: Hematology and Oncology | Admitting: Hematology and Oncology

## 2017-07-08 DIAGNOSIS — C50412 Malignant neoplasm of upper-outer quadrant of left female breast: Secondary | ICD-10-CM

## 2017-07-08 DIAGNOSIS — Z5111 Encounter for antineoplastic chemotherapy: Secondary | ICD-10-CM | POA: Diagnosis not present

## 2017-07-08 DIAGNOSIS — Z170 Estrogen receptor positive status [ER+]: Principal | ICD-10-CM

## 2017-07-08 NOTE — Assessment & Plan Note (Signed)
05/13/17: [facility] biopsy: Left breast biopsy 2 o'clock position 8 cm from nipple ultrasound-guided biopsy: Grade 2 IDC, ER 50%, PR 3%, HER-2 equivocal Ki-67 15% nipple area biopsy grade 2 IDC, ER 100% positive, PR negative, HER-2 equivocal, Ki-67 40%  06/11/17: Left Mastectomy: 2 foci of IDC 2.2 and 1 cm Grade 2, 1/2 LN Positive; Grade 2 IDC, ER 50%, PR 3%, HER-2 equivocal Ki-67 15% nipple area biopsy grade 2 IDC, ER 100% positive, PR negative, HER-2 equivocal, Ki-67 40% T2N1a Stage 1B  I discussed with her that given the fact that she has lymph node positive disease, this is clinically high risk disease.  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, potential benefit of treatment at 5 years: 94.6% distant metastasis free interval for patients treated with chemotherapy  Recommendation: 1. Adjuvant chemotherapy with dose dense Adriamycin and Cytoxan x4 followed by Taxol weekly x12  2. followed by radiation 3.  Followed by adjuvant antiestrogen therapy  Chemotherapy Counseling: I discussed the risks and benefits of chemotherapy including the risks of nausea/ vomiting, risk of infection from low WBC count, fatigue due to chemo or anemia, bruising or bleeding due to low platelets, mouth sores, loss/ change in taste and decreased appetite. Liver and kidney function will be monitored through out chemotherapy as abnormalities in liver and kidney function may be a side effect of treatment. Cardiac dysfunction due to Adriamycin were discussed in detail. Risk of permanent bone marrow dysfunction and leukemia due to chemo were also discussed.  Plan: Post placement, echocardiogram, chemo class, start chemotherapy in 2 weeks  

## 2017-07-08 NOTE — Progress Notes (Signed)
Patient Care Team: Sherrilee Gilles, DO as PCP - General (Family Medicine)  DIAGNOSIS:  Encounter Diagnosis  Name Primary?  . Malignant neoplasm of upper-outer quadrant of left breast in female, estrogen receptor positive ([place])     SUMMARY OF ONCOLOGIC HISTORY:   Malignant neoplasm of upper-outer quadrant of left breast in female, estrogen receptor positive ([place])   05/13/2017 Initial Diagnosis    [place] biopsy: Left breast biopsy 2 o'clock position 8 cm from nipple ultrasound-guided biopsy: Grade 2 IDC, ER 50%, PR 3%, HER-2 equivocal Ki-67 15% nipple area biopsy grade 2 IDC, ER 100% positive, PR negative, HER-2 equivocal, Ki-67 40%      05/31/2017 Breast MRI    Left breast UOQ 230 position: 2 x 1.9 x 1.9 cm mass, second mass left breast UOQ 230 position: 1 x 1 x 0.7 cm, 2 more enhancing adjacent 3 and 5 mm nodules slightly superiorly midway between the 2 masses.  No abnormal lymph nodes, right breast normal      06/11/2017 Surgery    Left Mastectomy: 2 foci of IDC 2.2 and 1 cm Grade 2, 1/2 LN Positive; Grade 2 IDC, ER 50%, PR 3%, HER-2 equivocal Ki-67 15% nipple area biopsy grade 2 IDC, ER 100% positive, PR negative, HER-2 equivocal, Ki-67 40% T2N1a Stage 1B      06/25/2017 Miscellaneous    Mammaprint high risk luminal type B       CHIEF COMPLIANT: Patient is here to discuss the results of Mammaprint and adjuvant treatment options  INTERVAL HISTORY: Breanna [name] is a 72 year old with above-mentioned history of left breast cancer with mastectomy and is here today to discuss the results of Mammaprint testing.  The Mammaprint came back as high risk and it is luminal type B.  Because of this she needs systemic chemotherapy and she is here today to discuss the treatment plan.  REVIEW OF SYSTEMS:   Constitutional: Denies fevers, chills or abnormal weight loss Eyes: Denies blurriness of vision Ears, nose, mouth, throat, and face: Denies mucositis or sore throat Respiratory:  Shortness of breath with oxygen to nasal cannula Cardiovascular: Denies palpitation, chest discomfort Gastrointestinal:  Denies nausea, heartburn or change in bowel habits Skin: Denies abnormal skin rashes Lymphatics: Denies new lymphadenopathy or easy bruising Neurological:Denies numbness, tingling or new weaknesses Behavioral/Psych: Mood is stable, no new changes  Extremities: No lower extremity edema  All other systems were reviewed with the patient and are negative.  I have reviewed the past medical history, past surgical history, social history and family history with the patient and they are unchanged from previous note.  ALLERGIES:  is allergic to atorvastatin; ancef [cefazolin]; lactose; mometasone; nasonex [mometasone furoate]; and neurontin [gabapentin].  MEDICATIONS:  Current Outpatient Medications  Medication Sig Dispense Refill  . acetaminophen (TYLENOL) 500 MG tablet Take 500 mg by mouth every 6 (six) hours as needed for moderate pain or headache.     Marland Kitchen amLODipine (NORVASC) 5 MG tablet Take 5 mg by mouth daily.    . carvedilol (COREG) 25 MG tablet Take 25 mg by mouth 2 (two) times daily with a meal.    . cetirizine (ZYRTEC) 10 MG tablet Take 10 mg by mouth daily.    . clopidogrel (PLAVIX) 75 MG tablet Take 75 mg by mouth daily.    . ferrous sulfate 300 (60 Fe) MG/5ML syrup Take 5 mLs (300 mg total) by mouth daily. 150 mL 1  . fluticasone (FLONASE) 50 MCG/ACT nasal spray Place 1 spray into both nostrils  daily as needed for allergies.     . hydrochlorothiazide (HYDRODIURIL) 25 MG tablet Take 25 mg by mouth daily.    . lansoprazole (PREVACID) 15 MG capsule Take 15 mg by mouth daily.     Marland Kitchen lisinopril (PRINIVIL,ZESTRIL) 40 MG tablet Take 40 mg by mouth daily.    . montelukast (SINGULAIR) 10 MG tablet Take 10 mg by mouth daily.    Marland Kitchen oxyCODONE (OXY IR/ROXICODONE) 5 MG immediate release tablet Take 1-2 tablets (5-10 mg total) by mouth every 6 (six) hours as needed for moderate  pain, severe pain or breakthrough pain. 25 tablet 0  . PARoxetine (PAXIL) 10 MG tablet Take 10 mg by mouth every evening.     Vladimir Faster Glycol-Propyl Glycol (SYSTANE OP) Place 1 drop into both eyes daily as needed (for dry eyes).     . potassium chloride (K-DUR,KLOR-CON) 10 MEQ tablet Take 10 mEq by mouth daily.     . traMADol (ULTRAM) 50 MG tablet Take 50 mg by mouth every 6 (six) hours as needed for moderate pain.     . traMADol (ULTRAM) 50 MG tablet Take 2 tablets (100 mg total) by mouth every 6 (six) hours as needed (mild pain). 20 tablet 0  . venlafaxine XR (EFFEXOR-XR) 37.5 MG 24 hr capsule Take 1 capsule (37.5 mg total) by mouth daily with breakfast. 30 capsule 3   No current facility-administered medications for this visit.     PHYSICAL EXAMINATION: ECOG PERFORMANCE STATUS: 1 - Symptomatic but completely ambulatory  Vitals:   07/08/17 1334  BP: (!) 141/67  Pulse: 60  Resp: 18  Temp: 97.6 F (36.4 C)  SpO2: 98%   Filed Weights   07/08/17 1334  Weight: 212 lb 11.2 oz (96.5 kg)    GENERAL:alert, no distress and comfortable SKIN: skin color, texture, turgor are normal, no rashes or significant lesions EYES: normal, Conjunctiva are pink and non-injected, sclera clear OROPHARYNX:no exudate, no erythema and lips, buccal mucosa, and tongue normal  NECK: supple, thyroid normal size, non-tender, without nodularity LYMPH:  no palpable lymphadenopathy in the cervical, axillary or inguinal LUNGS: clear to auscultation and percussion with normal breathing effort HEART: regular rate & rhythm and no murmurs and no lower extremity edema ABDOMEN:abdomen soft, non-tender and normal bowel sounds MUSCULOSKELETAL:no cyanosis of digits and no clubbing  NEURO: alert & oriented x 3 with fluent speech, no focal motor/sensory deficits EXTREMITIES: No lower extremity edema   LABORATORY DATA:  I have reviewed the data as listed CMP Latest Ref Rng & Units 06/13/2017 06/09/2017 09/18/2016  Glucose  65 - 99 mg/dL 159(H) 97 111(H)  BUN 6 - 20 mg/dL _0 Creatinine 0.44 - 1.00 mg/dL 0.64 0.61 0.57  Sodium 135 - 145 mmol/L 142 137 139  Potassium 3.5 - 5.1 mmol/L 4.0 3.6 3.4(L)  Chloride 101 - 111 mmol/L 104 100(L) 103  CO2 22 - 32 mmol/L _1 Calcium 8.9 - 10.3 mg/dL 8.4(L) 9.1 8.8(L)  Total Protein 6.5 - 8.1 g/dL - - 6.1(L)  Total Bilirubin 0.3 - 1.2 mg/dL - - 0.4  Alkaline Phos 38 - 126 U/L - - 77  AST 15 - 41 U/L - - 30  ALT 14 - 54 U/L - - 25    Lab Results  Component Value Date   WBC 11.3 (H) 06/13/2017   HGB 9.3 (L) 06/13/2017   HCT 30.0 (L) 06/13/2017   MCV 97.4 06/13/2017   PLT 203 06/13/2017   NEUTROABS 2.1 09/18/2016  ASSESSMENT & PLAN:  Malignant neoplasm of upper-outer quadrant of left breast in female, estrogen receptor positive ([place]) 05/13/17: [place] biopsy: Left breast biopsy 2 o'clock position 8 cm from nipple ultrasound-guided biopsy: Grade 2 IDC, ER 50%, PR 3%, HER-2 equivocal Ki-67 15% nipple area biopsy grade 2 IDC, ER 100% positive, PR negative, HER-2 equivocal, Ki-67 40%  06/11/17: Left Mastectomy: 2 foci of IDC 2.2 and 1 cm Grade 2, 1/2 LN Positive; Grade 2 IDC, ER 50%, PR 3%, HER-2 equivocal Ki-67 15% nipple area biopsy grade 2 IDC, ER 100% positive, PR negative, HER-2 equivocal, Ki-67 40% T2N1a Stage 1B  I discussed with her that given the fact that she has lymph node positive disease, this is clinically high risk disease.  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, potential benefit of treatment at 5 years: 94.6% distant metastasis free interval for patients treated with chemotherapy  Recommendation: 1. Adjuvant chemotherapy with dose dense Adriamycin and Cytoxan x4 followed by Taxol weekly x12  2. followed by radiation 3.  Followed by adjuvant antiestrogen therapy  Chemotherapy Counseling: I discussed the risks and benefits of chemotherapy including the risks of nausea/ vomiting, risk of infection from low WBC count, fatigue due to  chemo or anemia, bruising or bleeding due to low platelets, mouth sores, loss/ change in taste and decreased appetite. Liver and kidney function will be monitored through out chemotherapy as abnormalities in liver and kidney function may be a side effect of treatment. Cardiac dysfunction due to Adriamycin were discussed in detail. Risk of permanent bone marrow dysfunction and leukemia due to chemo were also discussed.  Plan: Post placement, echocardiogram, chemo class, start chemotherapy in 2 weeks  Shortness of breath: With O2 by nasal cannula.  Patient wishes to see pulmonary at [facility].  She wants me to help make a referral.   No orders of the defined types were placed in this encounter.  The patient has a good understanding of the overall plan. she agrees with it. she will call with any problems that may develop before the next visit here.   Harriette Ohara, MD 07/08/17

## 2017-07-08 NOTE — Progress Notes (Signed)
Referral sent to [facility].  Cyndia Bent RN

## 2017-07-08 NOTE — Telephone Encounter (Signed)
Patient scheduled 5/17 per avail in treatment area. Patient asked to not be scheduled for an ECHO until she brings records from another facility.

## 2017-07-09 ENCOUNTER — Other Ambulatory Visit: Payer: Self-pay | Admitting: *Deleted

## 2017-07-09 ENCOUNTER — Inpatient Hospital Stay: Payer: Medicare Other

## 2017-07-09 DIAGNOSIS — Z170 Estrogen receptor positive status [ER+]: Principal | ICD-10-CM

## 2017-07-09 DIAGNOSIS — C50412 Malignant neoplasm of upper-outer quadrant of left female breast: Secondary | ICD-10-CM

## 2017-07-09 NOTE — Progress Notes (Signed)
ee

## 2017-07-13 ENCOUNTER — Other Ambulatory Visit (HOSPITAL_COMMUNITY): Payer: Self-pay | Admitting: Surgery

## 2017-07-13 ENCOUNTER — Other Ambulatory Visit: Payer: Self-pay | Admitting: Hematology and Oncology

## 2017-07-13 DIAGNOSIS — Z170 Estrogen receptor positive status [ER+]: Principal | ICD-10-CM

## 2017-07-13 DIAGNOSIS — C50412 Malignant neoplasm of upper-outer quadrant of left female breast: Secondary | ICD-10-CM

## 2017-07-14 ENCOUNTER — Telehealth: Payer: Self-pay | Admitting: *Deleted

## 2017-07-14 NOTE — Telephone Encounter (Signed)
Spoke with patient to let her know we received her echo report from February.  I have cancelled her echo appointment for 5/9. She is aware.

## 2017-07-15 ENCOUNTER — Other Ambulatory Visit (HOSPITAL_COMMUNITY): Payer: Medicare Other

## 2017-07-16 ENCOUNTER — Other Ambulatory Visit: Payer: Self-pay | Admitting: Radiology

## 2017-07-19 ENCOUNTER — Other Ambulatory Visit: Payer: Self-pay | Admitting: Hematology and Oncology

## 2017-07-19 ENCOUNTER — Ambulatory Visit (HOSPITAL_COMMUNITY)
Admission: RE | Admit: 2017-07-19 | Discharge: 2017-07-19 | Disposition: A | Discharge status: Home / Self Care | Payer: Medicare Other | Source: Ambulatory Visit | Attending: Hematology and Oncology | Admitting: Hematology and Oncology

## 2017-07-19 ENCOUNTER — Encounter (HOSPITAL_COMMUNITY): Payer: Self-pay

## 2017-07-19 DIAGNOSIS — C50412 Malignant neoplasm of upper-outer quadrant of left female breast: Secondary | ICD-10-CM

## 2017-07-19 DIAGNOSIS — K219 Gastro-esophageal reflux disease without esophagitis: Secondary | ICD-10-CM | POA: Insufficient documentation

## 2017-07-19 DIAGNOSIS — Z170 Estrogen receptor positive status [ER+]: Principal | ICD-10-CM

## 2017-07-19 DIAGNOSIS — Z9981 Dependence on supplemental oxygen: Secondary | ICD-10-CM | POA: Insufficient documentation

## 2017-07-19 DIAGNOSIS — M1990 Unspecified osteoarthritis, unspecified site: Secondary | ICD-10-CM | POA: Insufficient documentation

## 2017-07-19 DIAGNOSIS — Z7951 Long term (current) use of inhaled steroids: Secondary | ICD-10-CM | POA: Diagnosis not present

## 2017-07-19 DIAGNOSIS — C50912 Malignant neoplasm of unspecified site of left female breast: Secondary | ICD-10-CM | POA: Diagnosis present

## 2017-07-19 DIAGNOSIS — Z87891 Personal history of nicotine dependence: Secondary | ICD-10-CM | POA: Insufficient documentation

## 2017-07-19 DIAGNOSIS — Z7902 Long term (current) use of antithrombotics/antiplatelets: Secondary | ICD-10-CM | POA: Diagnosis not present

## 2017-07-19 DIAGNOSIS — Z8673 Personal history of transient ischemic attack (TIA), and cerebral infarction without residual deficits: Secondary | ICD-10-CM | POA: Diagnosis not present

## 2017-07-19 DIAGNOSIS — F410 Panic disorder [episodic paroxysmal anxiety] without agoraphobia: Secondary | ICD-10-CM | POA: Diagnosis not present

## 2017-07-19 DIAGNOSIS — I10 Essential (primary) hypertension: Secondary | ICD-10-CM | POA: Insufficient documentation

## 2017-07-19 HISTORY — PX: IR US GUIDE VASC ACCESS RIGHT: IMG2390

## 2017-07-19 HISTORY — PX: IR FLUORO GUIDE PORT INSERTION RIGHT: IMG5741

## 2017-07-19 LAB — CBC
HCT: 37.1 % (ref 36.0–46.0)
Hemoglobin: 11.7 g/dL — ABNORMAL LOW (ref 12.0–15.0)
MCH: 30.5 pg (ref 26.0–34.0)
MCHC: 31.5 g/dL (ref 30.0–36.0)
MCV: 96.9 fL (ref 78.0–100.0)
PLATELETS: 230 10*3/uL (ref 150–400)
RBC: 3.83 MIL/uL — ABNORMAL LOW (ref 3.87–5.11)
RDW: 13.7 % (ref 11.5–15.5)
WBC: 3.6 10*3/uL — AB (ref 4.0–10.5)

## 2017-07-19 LAB — PROTIME-INR
INR: 0.98
PROTHROMBIN TIME: 12.9 s (ref 11.4–15.2)

## 2017-07-19 LAB — BASIC METABOLIC PANEL
Anion gap: 6 (ref 5–15)
BUN: 10 mg/dL (ref 6–20)
CALCIUM: 8.9 mg/dL (ref 8.9–10.3)
CHLORIDE: 102 mmol/L (ref 101–111)
CO2: 32 mmol/L (ref 22–32)
CREATININE: 0.66 mg/dL (ref 0.44–1.00)
GFR calc Af Amer: >60 mL/min (ref >60)
GFR calc non Af Amer: >60 mL/min (ref >60)
Glucose, Bld: 111 mg/dL — ABNORMAL HIGH (ref 65–99)
Potassium: 4.1 mmol/L (ref 3.5–5.1)
SODIUM: 140 mmol/L (ref 135–145)

## 2017-07-19 MED ORDER — MIDAZOLAM HCL 2 MG/2ML IJ SOLN
INTRAMUSCULAR | Status: AC
  Filled 2017-07-19: qty 2

## 2017-07-19 MED ORDER — CLINDAMYCIN PHOSPHATE 900 MG/50ML IV SOLN
900.0000 mg | Freq: Once | INTRAVENOUS | Status: AC
  Administered 2017-07-19: 900 mg via INTRAVENOUS
  Filled 2017-07-19: qty 50

## 2017-07-19 MED ORDER — LIDOCAINE HCL (PF) 1 % IJ SOLN
INTRAMUSCULAR | Status: AC | PRN
  Administered 2017-07-19: 10 mL

## 2017-07-19 MED ORDER — FENTANYL CITRATE (PF) 100 MCG/2ML IJ SOLN
INTRAMUSCULAR | Status: AC | PRN
  Administered 2017-07-19: 50 ug via INTRAVENOUS
  Administered 2017-07-19 (×2): 25 ug via INTRAVENOUS

## 2017-07-19 MED ORDER — MIDAZOLAM HCL 2 MG/2ML IJ SOLN
INTRAMUSCULAR | Status: AC | PRN
Start: 2017-07-19 — End: 2017-07-19
  Administered 2017-07-19 (×2): 0.5 mg via INTRAVENOUS
  Administered 2017-07-19: 1 mg via INTRAVENOUS

## 2017-07-19 MED ORDER — VANCOMYCIN HCL IN DEXTROSE 1-5 GM/200ML-% IV SOLN
INTRAVENOUS | Status: AC
  Filled 2017-07-19: qty 200

## 2017-07-19 MED ORDER — FENTANYL CITRATE (PF) 100 MCG/2ML IJ SOLN
INTRAMUSCULAR | Status: AC
  Filled 2017-07-19: qty 2

## 2017-07-19 MED ORDER — VANCOMYCIN HCL IN DEXTROSE 1-5 GM/200ML-% IV SOLN
1000.0000 mg | INTRAVENOUS | Status: AC
  Administered 2017-07-19: 1000 mg via INTRAVENOUS

## 2017-07-19 MED ORDER — SODIUM CHLORIDE 0.9 % IV SOLN: INTRAVENOUS | Status: DC

## 2017-07-19 MED ORDER — HEPARIN SOD (PORK) LOCK FLUSH 100 UNIT/ML IV SOLN
INTRAVENOUS | Status: AC
  Filled 2017-07-19: qty 5

## 2017-07-19 MED ORDER — HEPARIN SOD (PORK) LOCK FLUSH 100 UNIT/ML IV SOLN
INTRAVENOUS | Status: AC | PRN
Start: 2017-07-19 — End: 2017-07-19
  Administered 2017-07-19: 500 [IU] via INTRAVENOUS

## 2017-07-19 MED ORDER — LIDOCAINE HCL 1 % IJ SOLN
INTRAMUSCULAR | Status: AC
  Filled 2017-07-19: qty 20

## 2017-07-19 NOTE — Discharge Instructions (Signed)
Implanted Port Insertion, Care After °This sheet gives you information about how to care for yourself after your procedure. Your health care provider may also give you more specific instructions. If you have problems or questions, contact your health care provider. °What can I expect after the procedure? °After your procedure, it is common to have: °· Discomfort at the port insertion site. °· Bruising on the skin over the port. This should improve over 3-4 days. ° °Follow these instructions at home: °Port care °· After your port is placed, you will get a manufacturer's information card. The card has information about your port. Keep this card with you at all times. °· Take care of the port as told by your health care provider. Ask your health care provider if you or a family member can get training for taking care of the port at home. A home health care nurse may also take care of the port. °· Make sure to remember what type of port you have. °Incision care °· Follow instructions from your health care provider about how to take care of your port insertion site. Make sure you: °? Wash your hands with soap and water before you change your bandage (dressing). If soap and water are not available, use hand sanitizer. °? Change your dressing as told by your health care provider. °? Leave stitches (sutures), skin glue, or adhesive strips in place. These skin closures may need to stay in place for 2 weeks or longer. If adhesive strip edges start to loosen and curl up, you may trim the loose edges. Do not remove adhesive strips completely unless your health care provider tells you to do that. °· Check your port insertion site every day for signs of infection. Check for: °? More redness, swelling, or pain. °? More fluid or blood. °? Warmth. °? Pus or a bad smell. °General instructions °· Do not take baths, swim, or use a hot tub until your health care provider approves. °· Do not lift anything that is heavier than 10 lb (4.5  kg) for a week, or as told by your health care provider. °· Ask your health care provider when it is okay to: °? Return to work or school. °? Resume usual physical activities or sports. °· Do not drive for 24 hours if you were given a medicine to help you relax (sedative). °· Take over-the-counter and prescription medicines only as told by your health care provider. °· Wear a medical alert bracelet in case of an emergency. This will tell any health care providers that you have a port. °· Keep all follow-up visits as told by your health care provider. This is important. °Contact a health care provider if: °· You cannot flush your port with saline as directed, or you cannot draw blood from the port. °· You have a fever or chills. °· You have more redness, swelling, or pain around your port insertion site. °· You have more fluid or blood coming from your port insertion site. °· Your port insertion site feels warm to the touch. °· You have pus or a bad smell coming from the port insertion site. °Get help right away if: °· You have chest pain or shortness of breath. °· You have bleeding from your port that you cannot control. °Summary °· Take care of the port as told by your health care provider. °· Change your dressing as told by your health care provider. °· Keep all follow-up visits as told by your health care provider. °  This information is not intended to replace advice given to you by your health care provider. Make sure you discuss any questions you have with your health care provider. °Document Released: 12/14/2012 Document Revised: 01/15/2016 Document Reviewed: 01/15/2016 °Elsevier Interactive Patient Education © 2017 [employer] Inc[employer] ° °

## 2017-07-19 NOTE — Procedures (Signed)
Interventional Radiology Procedure Note  Procedure: Single Lumen Power Port Placement    Access:  Right IJ vein.  Findings: Catheter tip positioned at SVC/RA junction. Port is ready for immediate use.   Complications: None  EBL: < 10 mL  Recommendations:  - Ok to shower in 24 hours - Do not submerge for 7 days - Routine line care   Joely Losier T. Aubry Tucholski, M.D Pager:  [phone]   
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
## 2017-07-19 NOTE — Sedation Documentation (Signed)
Patient complaining of pain in right arm. Writer assessed right forearm and patient has red blotches noted to right forearm. Paitent denies throat swelling or itching all over. MD aware.

## 2017-07-20 ENCOUNTER — Encounter: Payer: Self-pay | Admitting: Hematology and Oncology

## 2017-07-21 ENCOUNTER — Other Ambulatory Visit: Payer: Self-pay | Admitting: Hematology and Oncology

## 2017-07-21 DIAGNOSIS — C50412 Malignant neoplasm of upper-outer quadrant of left female breast: Secondary | ICD-10-CM

## 2017-07-21 DIAGNOSIS — Z170 Estrogen receptor positive status [ER+]: Principal | ICD-10-CM

## 2017-07-21 MED ORDER — PROCHLORPERAZINE MALEATE 10 MG PO TABS: 10.0000 mg | ORAL_TABLET | Freq: Four times a day (QID) | ORAL | 1 refills | Status: DC | PRN

## 2017-07-21 MED ORDER — DEXAMETHASONE 4 MG PO TABS: 4.0000 mg | ORAL_TABLET | Freq: Every day | ORAL | 0 refills | Status: DC

## 2017-07-21 MED ORDER — LORAZEPAM 0.5 MG PO TABS: 0.5000 mg | ORAL_TABLET | Freq: Every evening | ORAL | 0 refills | Status: DC | PRN

## 2017-07-21 MED ORDER — ONDANSETRON HCL 8 MG PO TABS: 8.0000 mg | ORAL_TABLET | Freq: Two times a day (BID) | ORAL | 1 refills | Status: DC | PRN

## 2017-07-21 MED ORDER — LIDOCAINE-PRILOCAINE 2.5-2.5 % EX CREA: TOPICAL_CREAM | CUTANEOUS | 3 refills | Status: DC

## 2017-07-21 NOTE — Progress Notes (Signed)
START ON PATHWAY REGIMEN - Breast   Dose-Dense AC q14 days:   A cycle is every 14 days:     Doxorubicin      Cyclophosphamide      [id number]   **Always confirm dose/schedule in your pharmacy ordering system**    Paclitaxel 80 mg/m2 Weekly:   Administer weekly:     Paclitaxel   **Always confirm dose/schedule in your pharmacy ordering system**    Patient Characteristics: Postoperative without Neoadjuvant Therapy (Pathologic Staging), Invasive Disease, Adjuvant Therapy, HER2 Negative/Unknown/Equivocal, ER Positive, Node Positive, Node Positive (1-3), MammaPrint(R) Ordered, High Genomic Risk Therapeutic Status: Postoperative without Neoadjuvant Therapy (Pathologic Staging) AJCC Grade: G2 AJCC N Category: pN1 AJCC M Category: cM0 ER Status: Positive (+) AJCC 8 Stage Grouping: IIB HER2 Status: Negative (-) Oncotype Dx Recurrence Score: Ordered Other Genomic Test AJCC T Category: pT2 PR Status: Negative (-) Has this patient completed genomic testing<= Yes - MammaPrint(R) MammaPrint(R) Score: High Genomic Risk Intent of Therapy: Curative Intent, Discussed with Patient 

## 2017-07-23 ENCOUNTER — Inpatient Hospital Stay (HOSPITAL_BASED_OUTPATIENT_CLINIC_OR_DEPARTMENT_OTHER): Payer: Medicare Other | Admitting: Medical

## 2017-07-23 ENCOUNTER — Inpatient Hospital Stay: Payer: Medicare Other

## 2017-07-23 ENCOUNTER — Inpatient Hospital Stay (HOSPITAL_BASED_OUTPATIENT_CLINIC_OR_DEPARTMENT_OTHER): Payer: Medicare Other | Admitting: Hematology and Oncology

## 2017-07-23 ENCOUNTER — Encounter: Payer: Self-pay | Admitting: *Deleted

## 2017-07-23 VITALS — BP 116/64 | HR 60 | Resp 17

## 2017-07-23 DIAGNOSIS — T8090XA Unspecified complication following infusion and therapeutic injection, initial encounter: Secondary | ICD-10-CM

## 2017-07-23 DIAGNOSIS — Z5111 Encounter for antineoplastic chemotherapy: Secondary | ICD-10-CM | POA: Diagnosis not present

## 2017-07-23 DIAGNOSIS — Z170 Estrogen receptor positive status [ER+]: Principal | ICD-10-CM

## 2017-07-23 DIAGNOSIS — C50412 Malignant neoplasm of upper-outer quadrant of left female breast: Secondary | ICD-10-CM

## 2017-07-23 DIAGNOSIS — Z95828 Presence of other vascular implants and grafts: Secondary | ICD-10-CM | POA: Insufficient documentation

## 2017-07-23 LAB — CMP (CANCER CENTER ONLY)
ALT: 27 U/L (ref 0–55)
AST: 30 U/L (ref 5–34)
Albumin: 3.5 g/dL (ref 3.5–5.0)
Alkaline Phosphatase: 71 U/L (ref 40–150)
Anion gap: 6 (ref 3–11)
BUN: 10 mg/dL (ref 7–26)
CO2: 33 mmol/L — AB (ref 22–29)
Calcium: 8.9 mg/dL (ref 8.4–10.4)
Chloride: 102 mmol/L (ref 98–109)
Creatinine: 0.69 mg/dL (ref 0.60–1.10)
GFR, Est AFR Am: >60 mL/min (ref >60)
GFR, Estimated: >60 mL/min (ref >60)
GLUCOSE: 110 mg/dL (ref 70–140)
POTASSIUM: 3.9 mmol/L (ref 3.5–5.1)
Sodium: 141 mmol/L (ref 136–145)
TOTAL PROTEIN: 6.3 g/dL — AB (ref 6.4–8.3)
Total Bilirubin: 0.3 mg/dL (ref 0.2–1.2)

## 2017-07-23 LAB — CBC WITH DIFFERENTIAL (CANCER CENTER ONLY)
BASOS ABS: 0 10*3/uL (ref 0.0–0.1)
Basophils Relative: 1 %
EOS PCT: 2 %
Eosinophils Absolute: 0.1 10*3/uL (ref 0.0–0.5)
HCT: 34.6 % — ABNORMAL LOW (ref 34.8–46.6)
Hemoglobin: 11.4 g/dL — ABNORMAL LOW (ref 11.6–15.9)
LYMPHS ABS: 1.4 10*3/uL (ref 0.9–3.3)
LYMPHS PCT: 42 %
MCH: 31.2 pg (ref 25.1–34.0)
MCHC: 33.1 g/dL (ref 31.5–36.0)
MCV: 94.3 fL (ref 79.5–101.0)
MONO ABS: 0.5 10*3/uL (ref 0.1–0.9)
MONOS PCT: 15 %
Neutro Abs: 1.4 10*3/uL — ABNORMAL LOW (ref 1.5–6.5)
Neutrophils Relative %: 40 %
PLATELETS: 197 10*3/uL (ref 145–400)
RBC: 3.67 MIL/uL — ABNORMAL LOW (ref 3.70–5.45)
RDW: 13.7 % (ref 11.2–14.5)
WBC Count: 3.4 10*3/uL — ABNORMAL LOW (ref 3.9–10.3)

## 2017-07-23 MED ORDER — PALONOSETRON HCL INJECTION 0.25 MG/5ML
INTRAVENOUS | Status: AC
Start: 2017-07-23 — End: ?
  Filled 2017-07-23: qty 5

## 2017-07-23 MED ORDER — HEPARIN SOD (PORK) LOCK FLUSH 100 UNIT/ML IV SOLN
500.0000 [IU] | Freq: Once | INTRAVENOUS | Status: AC | PRN
  Administered 2017-07-23: 500 [IU]
  Filled 2017-07-23: qty 5

## 2017-07-23 MED ORDER — FAMOTIDINE IN NACL 20-0.9 MG/50ML-% IV SOLN
20.0000 mg | Freq: Two times a day (BID) | INTRAVENOUS | Status: DC
  Administered 2017-07-23: 20 mg via INTRAVENOUS

## 2017-07-23 MED ORDER — SODIUM CHLORIDE 0.9 % IV SOLN
Freq: Once | INTRAVENOUS | Status: AC
  Administered 2017-07-23: 12:00:00 via INTRAVENOUS

## 2017-07-23 MED ORDER — DOXORUBICIN HCL CHEMO IV INJECTION 2 MG/ML
50.0000 mg/m2 | Freq: Once | INTRAVENOUS | Status: AC
  Administered 2017-07-23: 106 mg via INTRAVENOUS
  Filled 2017-07-23: qty 53

## 2017-07-23 MED ORDER — SODIUM CHLORIDE 0.9% FLUSH
10.0000 mL | INTRAVENOUS | Status: DC | PRN
  Administered 2017-07-23: 10 mL
  Filled 2017-07-23: qty 10

## 2017-07-23 MED ORDER — SODIUM CHLORIDE 0.9 % IV SOLN
Freq: Once | INTRAVENOUS | Status: AC
  Administered 2017-07-23: 12:00:00 via INTRAVENOUS
  Filled 2017-07-23: qty 5

## 2017-07-23 MED ORDER — SODIUM CHLORIDE 0.9 % IV SOLN
500.0000 mg/m2 | Freq: Once | INTRAVENOUS | Status: AC
  Administered 2017-07-23: 1060 mg via INTRAVENOUS
  Filled 2017-07-23: qty 53

## 2017-07-23 MED ORDER — PALONOSETRON HCL INJECTION 0.25 MG/5ML
0.2500 mg | Freq: Once | INTRAVENOUS | Status: AC
  Administered 2017-07-23: 0.25 mg via INTRAVENOUS

## 2017-07-23 NOTE — Progress Notes (Signed)
Patient Care Team: Sherrilee Gilles, DO as PCP - General (Family Medicine)  DIAGNOSIS:  Encounter Diagnosis  Name Primary?  . Malignant neoplasm of upper-outer quadrant of left breast in female, estrogen receptor positive ([place])     SUMMARY OF ONCOLOGIC HISTORY:   Malignant neoplasm of upper-outer quadrant of left breast in female, estrogen receptor positive ([place])   05/13/2017 Initial Diagnosis    [place] biopsy: Left breast biopsy 2 o'clock position 8 cm from nipple ultrasound-guided biopsy: Grade 2 IDC, ER 50%, PR 3%, HER-2 equivocal Ki-67 15% nipple area biopsy grade 2 IDC, ER 100% positive, PR negative, HER-2 equivocal, Ki-67 40%      05/31/2017 Breast MRI    Left breast UOQ 230 position: 2 x 1.9 x 1.9 cm mass, second mass left breast UOQ 230 position: 1 x 1 x 0.7 cm, 2 more enhancing adjacent 3 and 5 mm nodules slightly superiorly midway between the 2 masses.  No abnormal lymph nodes, right breast normal      06/11/2017 Surgery    Left Mastectomy: 2 foci of IDC 2.2 and 1 cm Grade 2, 1/2 LN Positive; Grade 2 IDC, ER 50%, PR 3%, HER-2 equivocal Ki-67 15% nipple area biopsy grade 2 IDC, ER 100% positive, PR negative, HER-2 equivocal, Ki-67 40% T2N1a Stage 1B      06/25/2017 Miscellaneous    Mammaprint high risk luminal type B      07/23/2017 -  Chemotherapy    Adjuvant chemotherapy with dose dense Adriamycin and Cytoxan x4 followed by Taxol weekly x12        CHIEF COMPLIANT: Cycle 1 day 1 adjuvant chemotherapy with dose dense Adriamycin and Cytoxan  INTERVAL HISTORY: Breanna [name] is a 72 year old with above-mentioned history of left breast cancer treated with mastectomy and had height Mammaprint and is here today to begin her first cycle of chemotherapy with dose dense Adriamycin and Cytoxan.  She is anxious to get started with the chemotherapy.  Port has been placed.  REVIEW OF SYSTEMS:   Constitutional: Denies fevers, chills or abnormal weight loss Eyes: Denies  blurriness of vision Ears, nose, mouth, throat, and face: Denies mucositis or sore throat Respiratory: Denies cough, dyspnea or wheezes Cardiovascular: Denies palpitation, chest discomfort Gastrointestinal:  Denies nausea, heartburn or change in bowel habits Skin: Denies abnormal skin rashes Lymphatics: Denies new lymphadenopathy or easy bruising Neurological:Denies numbness, tingling or new weaknesses Behavioral/Psych: Mood is stable, no new changes  Extremities: No lower extremity edema  All other systems were reviewed with the patient and are negative.  I have reviewed the past medical history, past surgical history, social history and family history with the patient and they are unchanged from previous note.  ALLERGIES:  is allergic to atorvastatin; ancef [cefazolin]; vancomycin; lactose; mometasone; nasonex [mometasone furoate]; and neurontin [gabapentin].  MEDICATIONS:  Current Outpatient Medications  Medication Sig Dispense Refill  . acetaminophen (TYLENOL) 500 MG tablet Take 500 mg by mouth every 6 (six) hours as needed for moderate pain or headache.     Marland Kitchen amLODipine (NORVASC) 5 MG tablet Take 5 mg by mouth daily.    . B Complex-C (SUPER B COMPLEX PO) Take 1 tablet by mouth daily.    . carvedilol (COREG) 25 MG tablet Take 25 mg by mouth 2 (two) times daily with a meal.    . cetirizine (ZYRTEC) 10 MG tablet Take 10 mg by mouth daily as needed for allergies.     Marland Kitchen clopidogrel (PLAVIX) 75 MG tablet Take 75 mg by  mouth daily.    Marland Kitchen dexamethasone (DECADRON) 4 MG tablet Take 1 tablet (4 mg total) by mouth daily for 2 days. Take 1 tab day after chemo and 1 tab 2 days after chemo with food 8 tablet 0  . ferrous sulfate 325 (65 FE) MG tablet Take 325 mg by mouth daily with breakfast.    . fluticasone (FLONASE) 50 MCG/ACT nasal spray Place 1 spray into both nostrils daily as needed for allergies.     . hydrochlorothiazide (HYDRODIURIL) 25 MG tablet Take 25 mg by mouth daily.    .  lansoprazole (PREVACID) 15 MG capsule Take 15 mg by mouth daily.     Marland Kitchen lidocaine-prilocaine (EMLA) cream Apply to affected area once 30 g 3  . lisinopril (PRINIVIL,ZESTRIL) 40 MG tablet Take 40 mg by mouth daily.    Marland Kitchen LORazepam (ATIVAN) 0.5 MG tablet Take 1 tablet (0.5 mg total) by mouth at bedtime as needed (Nausea or vomiting). 30 tablet 0  . montelukast (SINGULAIR) 10 MG tablet Take 10 mg by mouth daily.    . ondansetron (ZOFRAN) 8 MG tablet Take 1 tablet (8 mg total) by mouth 2 (two) times daily as needed. Start on the third day after chemotherapy. 30 tablet 1  . Polyethyl Glycol-Propyl Glycol (SYSTANE OP) Place 1 drop into both eyes daily as needed (for dry eyes).     . potassium chloride (K-DUR,KLOR-CON) 10 MEQ tablet Take 10 mEq by mouth daily.     . prochlorperazine (COMPAZINE) 10 MG tablet Take 1 tablet (10 mg total) by mouth every 6 (six) hours as needed (Nausea or vomiting). 30 tablet 1  . traMADol (ULTRAM) 50 MG tablet Take 2 tablets (100 mg total) by mouth every 6 (six) hours as needed (mild pain). (Patient taking differently: Take 50 mg by mouth every 6 (six) hours as needed (mild pain). ) 20 tablet 0  . venlafaxine XR (EFFEXOR-XR) 37.5 MG 24 hr capsule Take 1 capsule (37.5 mg total) by mouth daily with breakfast. 30 capsule 3   No current facility-administered medications for this visit.     PHYSICAL EXAMINATION: ECOG PERFORMANCE STATUS: 1 - Symptomatic but completely ambulatory  There were no vitals filed for this visit. There were no vitals filed for this visit.  GENERAL:alert, no distress and comfortable SKIN: skin color, texture, turgor are normal, no rashes or significant lesions EYES: normal, Conjunctiva are pink and non-injected, sclera clear OROPHARYNX:no exudate, no erythema and lips, buccal mucosa, and tongue normal  NECK: supple, thyroid normal size, non-tender, without nodularity LYMPH:  no palpable lymphadenopathy in the cervical, axillary or inguinal LUNGS:  clear to auscultation and percussion with normal breathing effort HEART: regular rate & rhythm and no murmurs and no lower extremity edema ABDOMEN:abdomen soft, non-tender and normal bowel sounds MUSCULOSKELETAL:no cyanosis of digits and no clubbing  NEURO: alert & oriented x 3 with fluent speech, no focal motor/sensory deficits EXTREMITIES: No lower extremity edema  LABORATORY DATA:  I have reviewed the data as listed CMP Latest Ref Rng & Units 07/19/2017 06/13/2017 06/09/2017  Glucose 65 - 99 mg/dL 111(H) 159(H) 97  BUN 6 - 20 mg/dL _0 Creatinine 0.44 - 1.00 mg/dL 0.66 0.64 0.61  Sodium 135 - 145 mmol/L 140 142 137  Potassium 3.5 - 5.1 mmol/L 4.1 4.0 3.6  Chloride 101 - 111 mmol/L 102 104 100(L)  CO2 22 - 32 mmol/L 32 29 29  Calcium 8.9 - 10.3 mg/dL 8.9 8.4(L) 9.1  Total Protein 6.5 - 8.1  g/dL - - -  Total Bilirubin 0.3 - 1.2 mg/dL - - -  Alkaline Phos 38 - 126 U/L - - -  AST 15 - 41 U/L - - -  ALT 14 - 54 U/L - - -    Lab Results  Component Value Date   WBC 3.6 (L) 07/19/2017   HGB 11.7 (L) 07/19/2017   HCT 37.1 07/19/2017   MCV 96.9 07/19/2017   PLT 230 07/19/2017   NEUTROABS 2.1 09/18/2016    ASSESSMENT & PLAN:  Malignant neoplasm of upper-outer quadrant of left breast in female, estrogen receptor positive ([place]) 05/13/17: [place] biopsy: Left breast biopsy 2 o'clock position 8 cm from nipple ultrasound-guided biopsy: Grade 2 IDC, ER 50%, PR 3%, HER-2 equivocal Ki-67 15% nipple area biopsy grade 2 IDC, ER 100% positive, PR negative, HER-2 equivocal, Ki-67 40%  06/11/17:Left Mastectomy: 2 foci of IDC 2.2 and 1 cm Grade 2, 1/2 LN Positive; Grade 2 IDC, ER 50%, PR 3%, HER-2 equivocal Ki-67 15% nipple area biopsy grade 2 IDC, ER 100% positive, PR negative, HER-2 equivocal, Ki-67 40% T2N1a Stage 1B I discussed with her that given the fact that she has lymph node positive disease, this is clinically high risk disease.  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,  potential benefit of treatment at 5 years: 94.6% distant metastasis free interval for patients treated with chemotherapy  Recommendation: 1. Adjuvant chemotherapy with dose dense Adriamycin and Cytoxan x4 followed by Taxol weekly x12  2. followed by radiation 3.  Followed by adjuvant antiestrogen therapy ----------------------------------------------------------------------- Current treatment: Cycle 1 day 1 dose dense Adriamycin Cytoxan Antiemetics were reviewed Chemotherapy consent obtained Chemotherapy education completed Echocardiogram  Closely monitoring for chemotherapy toxicities. Return to clinic in one week for toxicity check       No orders of the defined types were placed in this encounter.  The patient has a good understanding of the overall plan. she agrees with it. she will call with any problems that may develop before the next visit here.   Harriette Ohara, MD 07/23/17

## 2017-07-23 NOTE — Assessment & Plan Note (Signed)
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, potential benefit of treatment at 5 years: 94.6% distant metastasis free interval for patients treated with chemotherapy  Recommendation: 1. Adjuvant chemotherapy with dose dense Adriamycin and Cytoxan x4 followed by Taxol weekly x12  2. followed by radiation 3.  Followed by adjuvant antiestrogen therapy ----------------------------------------------------------------------- Current treatment: Cycle 1 day 1 dose dense Adriamycin Cytoxan Antiemetics were reviewed Chemotherapy consent obtained Chemotherapy education completed Echocardiogram  Closely monitoring for chemotherapy toxicities. Return to clinic in one week for toxicity check

## 2017-07-23 NOTE — Patient Instructions (Signed)
[facility] Discharge Instructions for Patients Receiving Chemotherapy  Today you received the following chemotherapy agents:  Cytoxan (cyclophosphamide), Adriamycin (doxorubicin)  To help prevent nausea and vomiting after your treatment, we encourage you to take your nausea medication as prescribed.   If you develop nausea and vomiting that is not controlled by your nausea medication, call the clinic.   BELOW ARE SYMPTOMS THAT SHOULD BE REPORTED IMMEDIATELY:  *FEVER GREATER THAN 100.5 F  *CHILLS WITH OR WITHOUT FEVER  NAUSEA AND VOMITING THAT IS NOT CONTROLLED WITH YOUR NAUSEA MEDICATION  *UNUSUAL SHORTNESS OF BREATH  *UNUSUAL BRUISING OR BLEEDING  TENDERNESS IN MOUTH AND THROAT WITH OR WITHOUT PRESENCE OF ULCERS  *URINARY PROBLEMS  *BOWEL PROBLEMS  UNUSUAL RASH Items with * indicate a potential emergency and should be followed up as soon as possible.  Feel free to call the clinic should you have any questions or concerns. The clinic phone number is [phone].  Please show the [facility] at check-in to the Emergency Department and triage nurse.    Cyclophosphamide injection What is this medicine? CYCLOPHOSPHAMIDE (sye kloe FOSS fa mide) is a chemotherapy drug. It slows the growth of cancer cells. This medicine is used to treat many types of cancer like lymphoma, myeloma, leukemia, breast cancer, and ovarian cancer, to name a few. This medicine may be used for other purposes; ask your health care provider or pharmacist if you have questions. COMMON BRAND NAME(S): Cytoxan, Neosar What should I tell my health care provider before I take this medicine? They need to know if you have any of these conditions: -blood disorders -history of other chemotherapy -infection -kidney disease -liver disease -recent or ongoing radiation therapy -tumors in the bone marrow -an unusual or allergic reaction to cyclophosphamide, other chemotherapy, other  medicines, foods, dyes, or preservatives -pregnant or trying to get pregnant -breast-feeding How should I use this medicine? This drug is usually given as an injection into a vein or muscle or by infusion into a vein. It is administered in a hospital or clinic by a specially trained health care professional. Talk to your pediatrician regarding the use of this medicine in children. Special care may be needed. Overdosage: If you think you have taken too much of this medicine contact a poison control center or emergency room at once. NOTE: This medicine is only for you. Do not share this medicine with others. What if I miss a dose? It is important not to miss your dose. Call your doctor or health care professional if you are unable to keep an appointment. What may interact with this medicine? This medicine may interact with the following medications: -amiodarone -amphotericin B -azathioprine -certain antiviral medicines for HIV or AIDS such as protease inhibitors (e.g., indinavir, ritonavir) and zidovudine -certain blood pressure medications such as benazepril, captopril, enalapril, fosinopril, lisinopril, moexipril, monopril, perindopril, quinapril, ramipril, trandolapril -certain cancer medications such as anthracyclines (e.g., daunorubicin, doxorubicin), busulfan, cytarabine, paclitaxel, pentostatin, tamoxifen, trastuzumab -certain diuretics such as chlorothiazide, chlorthalidone, hydrochlorothiazide, indapamide, metolazone -certain medicines that treat or prevent blood clots like warfarin -certain muscle relaxants such as succinylcholine -cyclosporine -etanercept -indomethacin -medicines to increase blood counts like filgrastim, pegfilgrastim, sargramostim -medicines used as general anesthesia -metronidazole -natalizumab This list may not describe all possible interactions. Give your health care provider a list of all the medicines, herbs, non-prescription drugs, or dietary supplements  you use. Also tell them if you smoke, drink alcohol, or use illegal drugs. Some items may interact with your medicine.  What should I watch for while using this medicine? Visit your doctor for checks on your progress. This drug may make you feel generally unwell. This is not uncommon, as chemotherapy can affect healthy cells as well as cancer cells. Report any side effects. Continue your course of treatment even though you feel ill unless your doctor tells you to stop. Drink water or other fluids as directed. Urinate often, even at night. In some cases, you may be given additional medicines to help with side effects. Follow all directions for their use. Call your doctor or health care professional for advice if you get a fever, chills or sore throat, or other symptoms of a cold or flu. Do not treat yourself. This drug decreases your body's ability to fight infections. Try to avoid being around people who are sick. This medicine may increase your risk to bruise or bleed. Call your doctor or health care professional if you notice any unusual bleeding. Be careful brushing and flossing your teeth or using a toothpick because you may get an infection or bleed more easily. If you have any dental work done, tell your dentist you are receiving this medicine. You may get drowsy or dizzy. Do not drive, use machinery, or do anything that needs mental alertness until you know how this medicine affects you. Do not become pregnant while taking this medicine or for 1 year after stopping it. Women should inform their doctor if they wish to become pregnant or think they might be pregnant. Men should not father a child while taking this medicine and for 4 months after stopping it. There is a potential for serious side effects to an unborn child. Talk to your health care professional or pharmacist for more information. Do not breast-feed an infant while taking this medicine. This medicine may interfere with the ability to  have a child. This medicine has caused ovarian failure in some women. This medicine has caused reduced sperm counts in some men. You should talk with your doctor or health care professional if you are concerned about your fertility. If you are going to have surgery, tell your doctor or health care professional that you have taken this medicine. What side effects may I notice from receiving this medicine? Side effects that you should report to your doctor or health care professional as soon as possible: -allergic reactions like skin rash, itching or hives, swelling of the face, lips, or tongue -low blood counts - this medicine may decrease the number of white blood cells, red blood cells and platelets. You may be at increased risk for infections and bleeding. -signs of infection - fever or chills, cough, sore throat, pain or difficulty passing urine -signs of decreased platelets or bleeding - bruising, pinpoint red spots on the skin, black, tarry stools, blood in the urine -signs of decreased red blood cells - unusually weak or tired, fainting spells, lightheadedness -breathing problems -dark urine -dizziness -palpitations -swelling of the ankles, feet, hands -trouble passing urine or change in the amount of urine -weight gain -yellowing of the eyes or skin Side effects that usually do not require medical attention (report to your doctor or health care professional if they continue or are bothersome): -changes in nail or skin color -hair loss -missed menstrual periods -mouth sores -nausea, vomiting This list may not describe all possible side effects. Call your doctor for medical advice about side effects. You may report side effects to FDA at 1-800-FDA-1088. Where should I keep my medicine? This drug is  given in a hospital or clinic and will not be stored at home. NOTE: This sheet is a summary. It may not cover all possible information. If you have questions about this medicine, talk to  your doctor, pharmacist, or health care provider.  2018 Elsevier/[employer] Standard (2012-01-08 16:22:58)   Doxorubicin injection What is this medicine? DOXORUBICIN (dox oh ROO bi sin) is a chemotherapy drug. It is used to treat many kinds of cancer like leukemia, lymphoma, neuroblastoma, sarcoma, and Wilms' tumor. It is also used to treat bladder cancer, breast cancer, lung cancer, ovarian cancer, stomach cancer, and thyroid cancer. This medicine may be used for other purposes; ask your health care provider or pharmacist if you have questions. COMMON BRAND NAME(S): Adriamycin, Adriamycin PFS, Adriamycin RDF, Rubex What should I tell my health care provider before I take this medicine? They need to know if you have any of these conditions: -heart disease -history of low blood counts caused by a medicine -liver disease -recent or ongoing radiation therapy -an unusual or allergic reaction to doxorubicin, other chemotherapy agents, other medicines, foods, dyes, or preservatives -pregnant or trying to get pregnant -breast-feeding How should I use this medicine? This drug is given as an infusion into a vein. It is administered in a hospital or clinic by a specially trained health care professional. If you have pain, swelling, burning or any unusual feeling around the site of your injection, tell your health care professional right away. Talk to your pediatrician regarding the use of this medicine in children. Special care may be needed. Overdosage: If you think you have taken too much of this medicine contact a poison control center or emergency room at once. NOTE: This medicine is only for you. Do not share this medicine with others. What if I miss a dose? It is important not to miss your dose. Call your doctor or health care professional if you are unable to keep an appointment. What may interact with this medicine? This medicine may interact with the following  medications: -6-mercaptopurine -paclitaxel -phenytoin -St. John's Wort -trastuzumab -verapamil This list may not describe all possible interactions. Give your health care provider a list of all the medicines, herbs, non-prescription drugs, or dietary supplements you use. Also tell them if you smoke, drink alcohol, or use illegal drugs. Some items may interact with your medicine. What should I watch for while using this medicine? This drug may make you feel generally unwell. This is not uncommon, as chemotherapy can affect healthy cells as well as cancer cells. Report any side effects. Continue your course of treatment even though you feel ill unless your doctor tells you to stop. There is a maximum amount of this medicine you should receive throughout your life. The amount depends on the medical condition being treated and your overall health. Your doctor will watch how much of this medicine you receive in your lifetime. Tell your doctor if you have taken this medicine before. You may need blood work done while you are taking this medicine. Your urine may turn red for a few days after your dose. This is not blood. If your urine is dark or brown, call your doctor. In some cases, you may be given additional medicines to help with side effects. Follow all directions for their use. Call your doctor or health care professional for advice if you get a fever, chills or sore throat, or other symptoms of a cold or flu. Do not treat yourself. This drug decreases your body's ability to  fight infections. Try to avoid being around people who are sick. This medicine may increase your risk to bruise or bleed. Call your doctor or health care professional if you notice any unusual bleeding. Talk to your doctor about your risk of cancer. You may be more at risk for certain types of cancers if you take this medicine. Do not become pregnant while taking this medicine or for 6 months after stopping it. Women should  inform their doctor if they wish to become pregnant or think they might be pregnant. Men should not father a child while taking this medicine and for 6 months after stopping it. There is a potential for serious side effects to an unborn child. Talk to your health care professional or pharmacist for more information. Do not breast-feed an infant while taking this medicine. This medicine has caused ovarian failure in some women and reduced sperm counts in some men This medicine may interfere with the ability to have a child. Talk with your doctor or health care professional if you are concerned about your fertility. What side effects may I notice from receiving this medicine? Side effects that you should report to your doctor or health care professional as soon as possible: -allergic reactions like skin rash, itching or hives, swelling of the face, lips, or tongue -breathing problems -chest pain -fast or irregular heartbeat -low blood counts - this medicine may decrease the number of white blood cells, red blood cells and platelets. You may be at increased risk for infections and bleeding. -pain, redness, or irritation at site where injected -signs of infection - fever or chills, cough, sore throat, pain or difficulty passing urine -signs of decreased platelets or bleeding - bruising, pinpoint red spots on the skin, black, tarry stools, blood in the urine -swelling of the ankles, feet, hands -tiredness -weakness Side effects that usually do not require medical attention (report to your doctor or health care professional if they continue or are bothersome): -diarrhea -hair loss -mouth sores -nail discoloration or damage -nausea -red colored urine -vomiting This list may not describe all possible side effects. Call your doctor for medical advice about side effects. You may report side effects to FDA at 1-800-FDA-1088. Where should I keep my medicine? This drug is given in a hospital or clinic  and will not be stored at home. NOTE: This sheet is a summary. It may not cover all possible information. If you have questions about this medicine, talk to your doctor, pharmacist, or health care provider.  2018 Elsevier/Gold Standard (2015-04-22 11:28:51)   Fosaprepitant injection What is this medicine? FOSAPREPITANT (fos ap RE pi tant) is used together with other medicines to prevent nausea and vomiting caused by cancer treatment (chemotherapy). This medicine may be used for other purposes; ask your health care provider or pharmacist if you have questions. COMMON BRAND NAME(S): Emend What should I tell my health care provider before I take this medicine? They need to know if you have any of these conditions: -liver disease -an unusual or allergic reaction to fosaprepitant, aprepitant, medicines, foods, dyes, or preservatives -pregnant or trying to get pregnant -breast-feeding How should I use this medicine? This medicine is for injection into a vein. It is given by a health care professional in a hospital or clinic setting. Talk to your pediatrician regarding the use of this medicine in children. Special care may be needed. Overdosage: If you think you have taken too much of this medicine contact a poison control center or emergency room  at once. NOTE: This medicine is only for you. Do not share this medicine with others. What if I miss a dose? This does not apply. What may interact with this medicine? Do not take this medicine with any of these medicines: -cisapride -flibanserin -lomitapide -pimozide This medicine may also interact with the following medications: -diltiazem -female hormones, like estrogens or progestins and birth control pills -medicines for fungal infections like ketoconazole and itraconazole -medicines for HIV -medicines for seizures or to control epilepsy like carbamazepine or phenytoin -medicines used for sleep or anxiety disorders like alprazolam,  diazepam, or midazolam -nefazodone -paroxetine -ranolazine -rifampin -some chemotherapy medications like etoposide, ifosfamide, vinblastine, vincristine -some antibiotics like clarithromycin, erythromycin, troleandomycin -steroid medicines like dexamethasone or methylprednisolone -tolbutamide -warfarin This list may not describe all possible interactions. Give your health care provider a list of all the medicines, herbs, non-prescription drugs, or dietary supplements you use. Also tell them if you smoke, drink alcohol, or use illegal drugs. Some items may interact with your medicine. What should I watch for while using this medicine? Do not take this medicine if you already have nausea and vomiting. Ask your health care provider what to do if you already have nausea. Birth control pills and other methods of hormonal contraception (for example, IUD or patch) may not work properly while you are taking this medicine. Use an extra method of birth control during treatment and for 1 month after your last dose of fosaprepitant. This medicine should not be used continuously for a long time. Visit your doctor or health care professional for regular check-ups. This medicine may change your liver function blood test results. What side effects may I notice from receiving this medicine? Side effects that you should report to your doctor or health care professional as soon as possible: -allergic reactions like skin rash, itching or hives, swelling of the face, lips, or tongue -breathing problems -changes in heart rhythm -high or low blood pressure -pain, redness, or irritation at site where injected -rectal bleeding -serious dizziness or disorientation, confusion -sharp or severe stomach pain -sharp pain in your leg Side effects that usually do not require medical attention (report to your doctor or health care professional if they continue or are bothersome): -constipation or diarrhea -hair  loss -headache -hiccups -loss of appetite -nausea -upset stomach -tiredness This list may not describe all possible side effects. Call your doctor for medical advice about side effects. You may report side effects to FDA at 1-800-FDA-1088. Where should I keep my medicine? This drug is given in a hospital or clinic and will not be stored at home. NOTE: This sheet is a summary. It may not cover all possible information. If you have questions about this medicine, talk to your doctor, pharmacist, or health care provider.  2018 Elsevier/Gold Standard (2014-04-11 10:45:34)

## 2017-07-26 ENCOUNTER — Inpatient Hospital Stay: Payer: Medicare Other

## 2017-07-26 VITALS — BP 132/90 | HR 67 | Temp 98.1°F

## 2017-07-26 DIAGNOSIS — Z170 Estrogen receptor positive status [ER+]: Principal | ICD-10-CM

## 2017-07-26 DIAGNOSIS — C50412 Malignant neoplasm of upper-outer quadrant of left female breast: Secondary | ICD-10-CM

## 2017-07-26 DIAGNOSIS — Z5111 Encounter for antineoplastic chemotherapy: Secondary | ICD-10-CM | POA: Diagnosis not present

## 2017-07-26 MED ORDER — PEGFILGRASTIM-CBQV 6 MG/0.6ML ~~LOC~~ SOSY
PREFILLED_SYRINGE | SUBCUTANEOUS | Status: AC
  Filled 2017-07-26: qty 0.6

## 2017-07-26 MED ORDER — PEGFILGRASTIM-CBQV 6 MG/0.6ML ~~LOC~~ SOSY
6.0000 mg | PREFILLED_SYRINGE | Freq: Once | SUBCUTANEOUS | Status: AC
  Administered 2017-07-26: 6 mg via SUBCUTANEOUS

## 2017-07-26 NOTE — Patient Instructions (Signed)
Pegfilgrastim injection What is this medicine? PEGFILGRASTIM (PEG fil gra stim) is a long-acting granulocyte colony-stimulating factor that stimulates the growth of neutrophils, a type of white blood cell important in the body's fight against infection. It is used to reduce the incidence of fever and infection in patients with certain types of cancer who are receiving chemotherapy that affects the bone marrow, and to increase survival after being exposed to high doses of radiation. This medicine may be used for other purposes; ask your health care provider or pharmacist if you have questions. COMMON BRAND NAME(S): Neulasta What should I tell my health care provider before I take this medicine? They need to know if you have any of these conditions: -kidney disease -latex allergy -ongoing radiation therapy -sickle cell disease -skin reactions to acrylic adhesives (On-Body Injector only) -an unusual or allergic reaction to pegfilgrastim, filgrastim, other medicines, foods, dyes, or preservatives -pregnant or trying to get pregnant -breast-feeding How should I use this medicine? This medicine is for injection under the skin. If you get this medicine at home, you will be taught how to prepare and give the pre-filled syringe or how to use the On-body Injector. Refer to the patient Instructions for Use for detailed instructions. Use exactly as directed. Tell your healthcare provider immediately if you suspect that the On-body Injector may not have performed as intended or if you suspect the use of the On-body Injector resulted in a missed or partial dose. It is important that you put your used needles and syringes in a special sharps container. Do not put them in a trash can. If you do not have a sharps container, call your pharmacist or healthcare provider to get one. Talk to your pediatrician regarding the use of this medicine in children. While this drug may be prescribed for selected conditions,  precautions do apply. Overdosage: If you think you have taken too much of this medicine contact a poison control center or emergency room at once. NOTE: This medicine is only for you. Do not share this medicine with others. What if I miss a dose? It is important not to miss your dose. Call your doctor or health care professional if you miss your dose. If you miss a dose due to an On-body Injector failure or leakage, a new dose should be administered as soon as possible using a single prefilled syringe for manual use. What may interact with this medicine? Interactions have not been studied. Give your health care provider a list of all the medicines, herbs, non-prescription drugs, or dietary supplements you use. Also tell them if you smoke, drink alcohol, or use illegal drugs. Some items may interact with your medicine. This list may not describe all possible interactions. Give your health care provider a list of all the medicines, herbs, non-prescription drugs, or dietary supplements you use. Also tell them if you smoke, drink alcohol, or use illegal drugs. Some items may interact with your medicine. What should I watch for while using this medicine? You may need blood work done while you are taking this medicine. If you are going to need a MRI, CT scan, or other procedure, tell your doctor that you are using this medicine (On-Body Injector only). What side effects may I notice from receiving this medicine? Side effects that you should report to your doctor or health care professional as soon as possible: -allergic reactions like skin rash, itching or hives, swelling of the face, lips, or tongue -dizziness -fever -pain, redness, or irritation at site   where injected -pinpoint red spots on the skin -red or dark-brown urine -shortness of breath or breathing problems -stomach or side pain, or pain at the shoulder -swelling -tiredness -trouble passing urine or change in the amount of urine Side  effects that usually do not require medical attention (report to your doctor or health care professional if they continue or are bothersome): -bone pain -muscle pain This list may not describe all possible side effects. Call your doctor for medical advice about side effects. You may report side effects to FDA at 1-800-FDA-1088. Where should I keep my medicine? Keep out of the reach of children. Store pre-filled syringes in a refrigerator between 2 and 8 degrees C (36 and 46 degrees F). Do not freeze. Keep in carton to protect from light. Throw away this medicine if it is left out of the refrigerator for more than 48 hours. Throw away any unused medicine after the expiration date. NOTE: This sheet is a summary. It may not cover all possible information. If you have questions about this medicine, talk to your doctor, pharmacist, or health care provider.  2018 [employer]vier/Gold Standard (2016-02-20 12:58:03)  

## 2017-07-26 NOTE — Progress Notes (Signed)
   DATE:  07/23/2017       X  CHEMO/IMMUNOTHERAPY REACTION            MD:  Lindi Adie   AGENT/BLOOD PRODUCT RECEIVING TODAY:   Adriamycin and Cytoxan  AGENT/BLOOD PRODUCT RECEIVING IMMEDIATELY PRIOR TO REACTION: Adriamycin  VS: BP:     116/64 p:       62 SPO2:       100% on 2 L/min via nasal cannula         REACTION(S): Tingling and numbness of the lips with chest tightness  PREMEDS: Aloxi and Emend  INTERVENTION:  Pepcid 20 mg IV x1  Review of Systems  Constitutional: Negative for diaphoresis.  HENT: Negative for trouble swallowing.   Respiratory: Positive for chest tightness. Negative for cough, choking, shortness of breath and wheezing.   Cardiovascular: Negative for chest pain and palpitations.  Gastrointestinal: Negative for nausea and vomiting.  Musculoskeletal: Negative for back pain.  Skin: Negative for color change and rash.  Neurological: Positive for numbness (Numbness and tingling of the lips). Negative for dizziness, light-headedness and headaches.    Physical Exam  Constitutional: No distress.  The patient is an elderly female who is receiving oxygen via nasal cannula.  HENT:  Head: Normocephalic and atraumatic.  Cardiovascular: Normal rate, regular rhythm and normal heart sounds. Exam reveals no gallop and no friction rub.  No murmur heard. Pulmonary/Chest: Effort normal and breath sounds normal. No respiratory distress. She has no wheezes. She has no rales.  Neurological: She is alert.  Skin: Skin is warm and dry. No rash noted. She is not diaphoretic. No erythema.     OUTCOME:  Patient responded to intervention. Chemo/Immunotherapy restarted and completed   Sandi Mealy, MHS, PA-C

## 2017-07-28 ENCOUNTER — Telehealth: Payer: Self-pay

## 2017-07-28 NOTE — Telephone Encounter (Signed)
Called pt to follow up after first time A/C 2 days ago. Pt states that she is doing her best to hydrate but doesn't seem to hydrate as much as she should. Pt does not like drinking water. Told pt that she may try drinking coconut water, or other flavored beverage without the caffeine.   Pt reports feeling nauseated, fatigue, constipated and headache. Explained to pt that headaches may be a side effect from her nausea medication zofran or aloxi. Advised that she drinks plenty of fluids and take compazine or lorazepam instead. Suggested that pt start using miralax to help soften her stool to prevent constipation. Advised that she checks her temperature at least BID and to practice good hand hygiene to prevent risk of infection.   Pt verbalized understanding and will try all nursing suggestions to manage symptoms at home. She knows to call with further concerns or questions.

## 2017-07-30 ENCOUNTER — Inpatient Hospital Stay (HOSPITAL_BASED_OUTPATIENT_CLINIC_OR_DEPARTMENT_OTHER): Payer: Medicare Other | Admitting: Hematology and Oncology

## 2017-07-30 ENCOUNTER — Inpatient Hospital Stay: Payer: Medicare Other

## 2017-07-30 DIAGNOSIS — C50412 Malignant neoplasm of upper-outer quadrant of left female breast: Secondary | ICD-10-CM | POA: Diagnosis not present

## 2017-07-30 DIAGNOSIS — Z170 Estrogen receptor positive status [ER+]: Secondary | ICD-10-CM

## 2017-07-30 DIAGNOSIS — Z5111 Encounter for antineoplastic chemotherapy: Secondary | ICD-10-CM | POA: Diagnosis not present

## 2017-07-30 DIAGNOSIS — Z95828 Presence of other vascular implants and grafts: Secondary | ICD-10-CM

## 2017-07-30 LAB — CBC WITH DIFFERENTIAL (CANCER CENTER ONLY)
BASOS PCT: 1 %
Basophils Absolute: 0 10*3/uL (ref 0.0–0.1)
EOS ABS: 0.1 10*3/uL (ref 0.0–0.5)
EOS PCT: 4 %
HCT: 34.3 % — ABNORMAL LOW (ref 34.8–46.6)
HEMOGLOBIN: 11 g/dL — AB (ref 11.6–15.9)
LYMPHS ABS: 1 10*3/uL (ref 0.9–3.3)
Lymphocytes Relative: 32 %
MCH: 31.3 pg (ref 25.1–34.0)
MCHC: 32.1 g/dL (ref 31.5–36.0)
MCV: 97.4 fL (ref 79.5–101.0)
MONOS PCT: 6 %
Monocytes Absolute: 0.2 10*3/uL (ref 0.1–0.9)
NEUTROS PCT: 57 %
Neutro Abs: 1.8 10*3/uL (ref 1.5–6.5)
PLATELETS: 122 10*3/uL — AB (ref 145–400)
RBC: 3.52 MIL/uL — ABNORMAL LOW (ref 3.70–5.45)
RDW: 13.3 % (ref 11.2–14.5)
WBC: 3.2 10*3/uL — AB (ref 3.9–10.3)

## 2017-07-30 LAB — CMP (CANCER CENTER ONLY)
ALK PHOS: 92 U/L (ref 40–150)
ALT: 27 U/L (ref 0–55)
AST: 24 U/L (ref 5–34)
Albumin: 3.5 g/dL (ref 3.5–5.0)
Anion gap: 8 (ref 3–11)
BUN: 16 mg/dL (ref 7–26)
CALCIUM: 9.1 mg/dL (ref 8.4–10.4)
CHLORIDE: 100 mmol/L (ref 98–109)
CO2: 30 mmol/L — AB (ref 22–29)
CREATININE: 0.68 mg/dL (ref 0.60–1.10)
Glucose, Bld: 141 mg/dL — ABNORMAL HIGH (ref 70–140)
Potassium: 4 mmol/L (ref 3.5–5.1)
Sodium: 138 mmol/L (ref 136–145)
Total Bilirubin: 0.3 mg/dL (ref 0.2–1.2)
Total Protein: 6.2 g/dL — ABNORMAL LOW (ref 6.4–8.3)

## 2017-07-30 MED ORDER — SODIUM CHLORIDE 0.9% FLUSH
10.0000 mL | INTRAVENOUS | Status: DC | PRN
  Administered 2017-07-30: 10 mL
  Filled 2017-07-30: qty 10

## 2017-07-30 MED ORDER — HEPARIN SOD (PORK) LOCK FLUSH 100 UNIT/ML IV SOLN
500.0000 [IU] | Freq: Once | INTRAVENOUS | Status: AC | PRN
  Administered 2017-07-30: 500 [IU]
  Filled 2017-07-30: qty 5

## 2017-07-30 NOTE — Progress Notes (Signed)
 Patient Care Team: McGee, Rachel, DO as PCP - General (Family Medicine)  DIAGNOSIS:  Encounter Diagnosis  Name Primary?  . Malignant neoplasm of upper-outer quadrant of left breast in female, estrogen receptor positive (HCC)     SUMMARY OF ONCOLOGIC HISTORY:   Malignant neoplasm of upper-outer quadrant of left breast in female, estrogen receptor positive (HCC)   05/13/2017 Initial Diagnosis    [place] biopsy: Left breast biopsy 2 o'clock position 8 cm from nipple ultrasound-guided biopsy: Grade 2 IDC, ER 50%, PR 3%, HER-2 equivocal Ki-67 15% nipple area biopsy grade 2 IDC, ER 100% positive, PR negative, HER-2 equivocal, Ki-67 40%      05/31/2017 Breast MRI    Left breast UOQ 230 position: 2 x 1.9 x 1.9 cm mass, second mass left breast UOQ 230 position: 1 x 1 x 0.7 cm, 2 more enhancing adjacent 3 and 5 mm nodules slightly superiorly midway between the 2 masses.  No abnormal lymph nodes, right breast normal      06/11/2017 Surgery    Left Mastectomy: 2 foci of IDC 2.2 and 1 cm Grade 2, 1/2 LN Positive; Grade 2 IDC, ER 50%, PR 3%, HER-2 equivocal Ki-67 15% nipple area biopsy grade 2 IDC, ER 100% positive, PR negative, HER-2 equivocal, Ki-67 40% T2N1a Stage 1B      06/25/2017 Miscellaneous    Mammaprint high risk luminal type B      07/23/2017 -  Chemotherapy    Adjuvant chemotherapy with dose dense Adriamycin and Cytoxan x4 followed by Taxol weekly x12        CHIEF COMPLIANT: Cycle 1 day 8 dose dense Adriamycin and Cytoxan  INTERVAL HISTORY: Breanna [name] is a 72-year-old with above-mentioned history of left breast cancer treated with mastectomy and is currently on adjuvant chemotherapy and today's cycle 1 day 8 of her treatment.  She tolerated chemo fairly well.  She had nausea for the first couple of days accompanied by profound fatigue.  She recovered after the next couple of days.  She did not have any vomiting.  Last couple of days she has been feeling much better.   Denies any fevers or chills.  REVIEW OF SYSTEMS:   Constitutional: Denies fevers, chills or abnormal weight loss Eyes: Denies blurriness of vision Ears, nose, mouth, throat, and face: Denies mucositis or sore throat Respiratory: Denies cough, dyspnea or wheezes Cardiovascular: Denies palpitation, chest discomfort Gastrointestinal:  Denies nausea, heartburn or change in bowel habits Skin: Denies abnormal skin rashes Lymphatics: Denies new lymphadenopathy or easy bruising Neurological:Denies numbness, tingling or new weaknesses Behavioral/Psych: Mood is stable, no new changes  Extremities: No lower extremity edema  All other systems were reviewed with the patient and are negative.  I have reviewed the past medical history, past surgical history, social history and family history with the patient and they are unchanged from previous note.  ALLERGIES:  is allergic to atorvastatin; ancef [cefazolin]; vancomycin; lactose; mometasone; nasonex [mometasone furoate]; and neurontin [gabapentin].  MEDICATIONS:  Current Outpatient Medications  Medication Sig Dispense Refill  . acetaminophen (TYLENOL) 500 MG tablet Take 500 mg by mouth every 6 (six) hours as needed for moderate pain or headache.     . amLODipine (NORVASC) 5 MG tablet Take 5 mg by mouth daily.    . B Complex-C (SUPER B COMPLEX PO) Take 1 tablet by mouth daily.    . carvedilol (COREG) 25 MG tablet Take 25 mg by mouth 2 (two) times daily with a meal.    .   cetirizine (ZYRTEC) 10 MG tablet Take 10 mg by mouth daily as needed for allergies.     . clopidogrel (PLAVIX) 75 MG tablet Take 75 mg by mouth daily.    . ferrous sulfate 325 (65 FE) MG tablet Take 325 mg by mouth daily with breakfast.    . fluticasone (FLONASE) 50 MCG/ACT nasal spray Place 1 spray into both nostrils daily as needed for allergies.     . hydrochlorothiazide (HYDRODIURIL) 25 MG tablet Take 25 mg by mouth daily.    . lansoprazole (PREVACID) 15 MG capsule Take 15 mg by  mouth daily.     . lidocaine-prilocaine (EMLA) cream Apply to affected area once 30 g 3  . lisinopril (PRINIVIL,ZESTRIL) 40 MG tablet Take 40 mg by mouth daily.    . LORazepam (ATIVAN) 0.5 MG tablet Take 1 tablet (0.5 mg total) by mouth at bedtime as needed (Nausea or vomiting). 30 tablet 0  . montelukast (SINGULAIR) 10 MG tablet Take 10 mg by mouth daily.    . ondansetron (ZOFRAN) 8 MG tablet Take 1 tablet (8 mg total) by mouth 2 (two) times daily as needed. Start on the third day after chemotherapy. 30 tablet 1  . Polyethyl Glycol-Propyl Glycol (SYSTANE OP) Place 1 drop into both eyes daily as needed (for dry eyes).     . potassium chloride (K-DUR,KLOR-CON) 10 MEQ tablet Take 10 mEq by mouth daily.     . prochlorperazine (COMPAZINE) 10 MG tablet Take 1 tablet (10 mg total) by mouth every 6 (six) hours as needed (Nausea or vomiting). 30 tablet 1  . traMADol (ULTRAM) 50 MG tablet Take 2 tablets (100 mg total) by mouth every 6 (six) hours as needed (mild pain). (Patient taking differently: Take 50 mg by mouth every 6 (six) hours as needed (mild pain). ) 20 tablet 0  . venlafaxine XR (EFFEXOR-XR) 37.5 MG 24 hr capsule Take 1 capsule (37.5 mg total) by mouth daily with breakfast. 30 capsule 3   No current facility-administered medications for this visit.     PHYSICAL EXAMINATION: ECOG PERFORMANCE STATUS: 1 - Symptomatic but completely ambulatory  Vitals:   07/30/17 1105  BP: 131/60  Pulse: 73  Resp: 18  Temp: 97.6 F (36.4 C)  SpO2: 98%   Filed Weights   07/30/17 1105  Weight: 213 lb 6.4 oz (96.8 kg)    GENERAL:alert, no distress and comfortable SKIN: skin color, texture, turgor are normal, no rashes or significant lesions EYES: normal, Conjunctiva are pink and non-injected, sclera clear OROPHARYNX:no exudate, no erythema and lips, buccal mucosa, and tongue normal  NECK: supple, thyroid normal size, non-tender, without nodularity LYMPH:  no palpable lymphadenopathy in the cervical,  axillary or inguinal LUNGS: clear to auscultation and percussion with normal breathing effort HEART: regular rate & rhythm and no murmurs and no lower extremity edema ABDOMEN:abdomen soft, non-tender and normal bowel sounds MUSCULOSKELETAL:no cyanosis of digits and no clubbing  NEURO: alert & oriented x 3 with fluent speech, no focal motor/sensory deficits EXTREMITIES: No lower extremity edema  LABORATORY DATA:  I have reviewed the data as listed CMP Latest Ref Rng & Units 07/30/2017 07/23/2017 07/19/2017  Glucose 70 - 140 mg/dL 141(H) 110 111(H)  BUN 7 - 26 mg/dL 16 10 10  Creatinine 0.60 - 1.10 mg/dL 0.68 0.69 0.66  Sodium 136 - 145 mmol/L 138 141 140  Potassium 3.5 - 5.1 mmol/L 4.0 3.9 4.1  Chloride 98 - 109 mmol/L 100 102 102  CO2 22 - 29 mmol/L 30(H)   33(H) 32  Calcium 8.4 - 10.4 mg/dL 9.1 8.9 8.9  Total Protein 6.4 - 8.3 g/dL 6.2(L) 6.3(L) -  Total Bilirubin 0.2 - 1.2 mg/dL 0.3 0.3 -  Alkaline Phos 40 - 150 U/L 92 71 -  AST 5 - 34 U/L 24 30 -  ALT 0 - 55 U/L 27 27 -    Lab Results  Component Value Date   WBC 3.2 (L) 07/30/2017   HGB 11.0 (L) 07/30/2017   HCT 34.3 (L) 07/30/2017   MCV 97.4 07/30/2017   PLT 122 (L) 07/30/2017   NEUTROABS 1.8 07/30/2017    ASSESSMENT & PLAN:  Malignant neoplasm of upper-outer quadrant of left breast in female, estrogen receptor positive (HCC) 05/13/17: [place] biopsy: Left breast biopsy 2 o'clock position 8 cm from nipple ultrasound-guided biopsy: Grade 2 IDC, ER 50%, PR 3%, HER-2 equivocal Ki-67 15% nipple area biopsy grade 2 IDC, ER 100% positive, PR negative, HER-2 equivocal, Ki-67 40%  06/11/17:Left Mastectomy: 2 foci of IDC 2.2 and 1 cm Grade 2, 1/2 LN Positive; Grade 2 IDC, ER 50%, PR 3%, HER-2 equivocal Ki-67 15% nipple area biopsy grade 2 IDC, ER 100% positive, PR negative, HER-2 equivocal, Ki-67 40% T2N1a Stage 1B I discussed with her that given the fact that she has lymph node positive disease, this is clinically high risk  disease.  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, potential benefit of treatment at 5 years: 94.6% distant metastasis free interval for patients treated with chemotherapy  Recommendation: 1.Adjuvant chemotherapy with dose dense Adriamycin and Cytoxan x4 followed by Taxol weekly x12 2.followed by radiation 3.Followed by adjuvant antiestrogen therapy ----------------------------------------------------------------------- Current treatment: Cycle 1 day 8 dose dense Adriamycin Cytoxan Chemo Toxicities: 1.  Nausea for first couple of days  2. fatigue  Labs have been reviewed. I will continue to maintain her on the same dosage of chemotherapy RTC in 1 week for cycle 2      No orders of the defined types were placed in this encounter.  The patient has a good understanding of the overall plan. she agrees with it. she will call with any problems that may develop before the next visit here.   Viinay K Gudena, MD 07/30/17    

## 2017-07-30 NOTE — Assessment & Plan Note (Signed)
05/13/17: [name] biopsy: Left breast biopsy 2 o'clock position 8 cm from nipple ultrasound-guided biopsy: Grade 2 IDC, ER 50%, PR 3%, Breanna [name]-2 equivocal Ki-67 15% nipple area biopsy grade 2 IDC, ER 100% positive, PR negative, Breanna [name]-2 equivocal, Ki-67 40%  06/11/17:Left Mastectomy: 2 foci of IDC 2.2 and 1 cm Grade 2, 1/2 LN Positive; Grade 2 IDC, ER 50%, PR 3%, Breanna [name]-2 equivocal Ki-67 15% nipple area biopsy grade 2 IDC, ER 100% positive, PR negative, Breanna [name]-2 equivocal, Ki-67 40% T2N1a Stage 1B I discussed with Breanna [name] that given the fact that she has lymph node positive disease, this is clinically high risk disease.  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, potential benefit of treatment at 5 years: 94.6% distant metastasis free interval for patients treated with chemotherapy  Recommendation: 1.Adjuvant chemotherapy with dose dense Adriamycin and Cytoxan x4 followed by Taxol weekly x12 2.followed by radiation 3.Followed by adjuvant antiestrogen therapy ----------------------------------------------------------------------- Current treatment: Cycle 1 day 8 dose dense Adriamycin Cytoxan Chemo Toxicities:   RTC in 1 week for cycle 2

## 2017-07-31 ENCOUNTER — Other Ambulatory Visit: Payer: Self-pay | Admitting: Hematology and Oncology

## 2017-07-31 DIAGNOSIS — C50412 Malignant neoplasm of upper-outer quadrant of left female breast: Secondary | ICD-10-CM

## 2017-07-31 DIAGNOSIS — Z170 Estrogen receptor positive status [ER+]: Principal | ICD-10-CM

## 2017-08-03 ENCOUNTER — Telehealth: Payer: Self-pay

## 2017-08-03 ENCOUNTER — Encounter: Payer: Self-pay | Admitting: Hematology and Oncology

## 2017-08-03 NOTE — Telephone Encounter (Signed)
Attempted to call pt. Left VM with patients husband to call us back when she got in.  Cyndia Bent RN

## 2017-08-05 ENCOUNTER — Other Ambulatory Visit: Payer: Medicare Other

## 2017-08-05 ENCOUNTER — Ambulatory Visit: Payer: Medicare Other

## 2017-08-05 ENCOUNTER — Ambulatory Visit: Payer: Medicare Other | Admitting: Hematology and Oncology

## 2017-08-09 ENCOUNTER — Other Ambulatory Visit: Payer: Self-pay | Admitting: Hematology and Oncology

## 2017-08-11 ENCOUNTER — Inpatient Hospital Stay: Payer: Medicare Other

## 2017-08-11 ENCOUNTER — Inpatient Hospital Stay: Payer: Medicare Other | Attending: Hematology and Oncology

## 2017-08-11 ENCOUNTER — Inpatient Hospital Stay (HOSPITAL_BASED_OUTPATIENT_CLINIC_OR_DEPARTMENT_OTHER): Payer: Medicare Other | Admitting: Hematology and Oncology

## 2017-08-11 ENCOUNTER — Encounter: Payer: Self-pay | Admitting: *Deleted

## 2017-08-11 ENCOUNTER — Telehealth: Payer: Self-pay

## 2017-08-11 VITALS — BP 135/62 | HR 69 | Temp 97.7°F | Resp 19

## 2017-08-11 DIAGNOSIS — Z79899 Other long term (current) drug therapy: Secondary | ICD-10-CM | POA: Insufficient documentation

## 2017-08-11 DIAGNOSIS — Z170 Estrogen receptor positive status [ER+]: Secondary | ICD-10-CM | POA: Diagnosis not present

## 2017-08-11 DIAGNOSIS — C50412 Malignant neoplasm of upper-outer quadrant of left female breast: Secondary | ICD-10-CM

## 2017-08-11 DIAGNOSIS — Z9012 Acquired absence of left breast and nipple: Secondary | ICD-10-CM | POA: Insufficient documentation

## 2017-08-11 DIAGNOSIS — Z95828 Presence of other vascular implants and grafts: Secondary | ICD-10-CM

## 2017-08-11 DIAGNOSIS — Z5111 Encounter for antineoplastic chemotherapy: Secondary | ICD-10-CM | POA: Diagnosis present

## 2017-08-11 LAB — CBC WITH DIFFERENTIAL (CANCER CENTER ONLY)
BASOS PCT: 1 %
Basophils Absolute: 0 10*3/uL (ref 0.0–0.1)
EOS ABS: 0 10*3/uL (ref 0.0–0.5)
EOS PCT: 0 %
HCT: 34.6 % — ABNORMAL LOW (ref 34.8–46.6)
HEMOGLOBIN: 11.4 g/dL — AB (ref 11.6–15.9)
Lymphocytes Relative: 23 %
Lymphs Abs: 1.7 10*3/uL (ref 0.9–3.3)
MCH: 31.2 pg (ref 25.1–34.0)
MCHC: 32.9 g/dL (ref 31.5–36.0)
MCV: 94.8 fL (ref 79.5–101.0)
MONO ABS: 1 10*3/uL — AB (ref 0.1–0.9)
MONOS PCT: 13 %
NEUTROS PCT: 63 %
Neutro Abs: 4.7 10*3/uL (ref 1.5–6.5)
PLATELETS: 198 10*3/uL (ref 145–400)
RBC: 3.65 MIL/uL — ABNORMAL LOW (ref 3.70–5.45)
RDW: 13.7 % (ref 11.2–14.5)
WBC Count: 7.5 10*3/uL (ref 3.9–10.3)

## 2017-08-11 LAB — CMP (CANCER CENTER ONLY)
ALBUMIN: 3.9 g/dL (ref 3.5–5.0)
ALT: 32 U/L (ref 0–55)
ANION GAP: 9 (ref 3–11)
AST: 37 U/L — ABNORMAL HIGH (ref 5–34)
Alkaline Phosphatase: 72 U/L (ref 40–150)
BUN: 19 mg/dL (ref 7–26)
CHLORIDE: 99 mmol/L (ref 98–109)
CO2: 30 mmol/L — AB (ref 22–29)
Calcium: 9.4 mg/dL (ref 8.4–10.4)
Creatinine: 0.72 mg/dL (ref 0.60–1.10)
GFR, Est AFR Am: >60 mL/min (ref >60)
GFR, Estimated: >60 mL/min (ref >60)
GLUCOSE: 114 mg/dL (ref 70–140)
POTASSIUM: 3.9 mmol/L (ref 3.5–5.1)
SODIUM: 138 mmol/L (ref 136–145)
Total Bilirubin: <0.2 mg/dL — ABNORMAL LOW (ref 0.2–1.2)
Total Protein: 6.7 g/dL (ref 6.4–8.3)

## 2017-08-11 MED ORDER — SODIUM CHLORIDE 0.9 % IV SOLN
Freq: Once | INTRAVENOUS | Status: AC
  Administered 2017-08-11: 13:00:00 via INTRAVENOUS

## 2017-08-11 MED ORDER — PEGFILGRASTIM 6 MG/0.6ML ~~LOC~~ PSKT
6.0000 mg | PREFILLED_SYRINGE | Freq: Once | SUBCUTANEOUS | Status: AC
  Administered 2017-08-11: 6 mg via SUBCUTANEOUS

## 2017-08-11 MED ORDER — DOXORUBICIN HCL CHEMO IV INJECTION 2 MG/ML
50.0000 mg/m2 | Freq: Once | INTRAVENOUS | Status: AC
  Administered 2017-08-11: 106 mg via INTRAVENOUS
  Filled 2017-08-11: qty 53

## 2017-08-11 MED ORDER — SODIUM CHLORIDE 0.9% FLUSH
10.0000 mL | INTRAVENOUS | Status: DC | PRN
  Administered 2017-08-11: 10 mL
  Filled 2017-08-11: qty 10

## 2017-08-11 MED ORDER — PEGFILGRASTIM 6 MG/0.6ML ~~LOC~~ PSKT
PREFILLED_SYRINGE | SUBCUTANEOUS | Status: AC
Start: 2017-08-11 — End: ?
  Filled 2017-08-11: qty 0.6

## 2017-08-11 MED ORDER — SODIUM CHLORIDE 0.9 % IV SOLN
500.0000 mg/m2 | Freq: Once | INTRAVENOUS | Status: AC
  Administered 2017-08-11: 1060 mg via INTRAVENOUS
  Filled 2017-08-11: qty 53

## 2017-08-11 MED ORDER — PALONOSETRON HCL INJECTION 0.25 MG/5ML
INTRAVENOUS | Status: AC
  Filled 2017-08-11: qty 5

## 2017-08-11 MED ORDER — PALONOSETRON HCL INJECTION 0.25 MG/5ML
0.2500 mg | Freq: Once | INTRAVENOUS | Status: AC
  Administered 2017-08-11: 0.25 mg via INTRAVENOUS

## 2017-08-11 MED ORDER — FOSAPREPITANT DIMEGLUMINE INJECTION 150 MG
Freq: Once | INTRAVENOUS | Status: AC
  Administered 2017-08-11: 13:00:00 via INTRAVENOUS
  Filled 2017-08-11: qty 5

## 2017-08-11 MED ORDER — HEPARIN SOD (PORK) LOCK FLUSH 100 UNIT/ML IV SOLN
500.0000 [IU] | Freq: Once | INTRAVENOUS | Status: AC | PRN
  Administered 2017-08-11: 500 [IU]
  Filled 2017-08-11: qty 5

## 2017-08-11 NOTE — Assessment & Plan Note (Addendum)
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, potential benefit of treatment at 5 years: 94.6% distant metastasis free interval for patients treated with chemotherapy  Recommendation: 1.Adjuvant chemotherapy with dose dense Adriamycin and Cytoxan x4 followed by Taxol weekly x12 2.followed by radiation 3.Followed by adjuvant antiestrogen therapy ----------------------------------------------------------------------- Current treatment: Cycle 2 day 1 dose dense Adriamycin Cytoxan  Chemo Toxicities: 1.  Nausea for first couple of days  2. fatigue  Labs have been reviewed. I will continue to maintain her on the same dosage of chemotherapy.  RTC in 2 weeks for cycle 3

## 2017-08-11 NOTE — Telephone Encounter (Signed)
Per Dr Lindi Adie, he switching pt to [facility]. Notified Herbalist in infusion.

## 2017-08-11 NOTE — Patient Instructions (Signed)
[facility] Discharge Instructions for Patients Receiving Chemotherapy  Today you received the following chemotherapy agents  Cytoxan, Adriamycin.   To help prevent nausea and vomiting after your treatment, we encourage you to take your nausea medication as prescribed.   If you develop nausea and vomiting that is not controlled by your nausea medication, call the clinic.    Neulasta On-Pro will begin at 630p you may remove at 8p.    BELOW ARE SYMPTOMS THAT SHOULD BE REPORTED IMMEDIATELY:  *FEVER GREATER THAN 100.5 F  *CHILLS WITH OR WITHOUT FEVER  NAUSEA AND VOMITING THAT IS NOT CONTROLLED WITH YOUR NAUSEA MEDICATION  *UNUSUAL SHORTNESS OF BREATH  *UNUSUAL BRUISING OR BLEEDING  TENDERNESS IN MOUTH AND THROAT WITH OR WITHOUT PRESENCE OF ULCERS  *URINARY PROBLEMS  *BOWEL PROBLEMS  UNUSUAL RASH Items with * indicate a potential emergency and should be followed up as soon as possible.  Feel free to call the clinic should you have any questions or concerns. The clinic phone number is [phone].  Please show the [facility] at check-in to the Emergency Department and triage nurse.

## 2017-08-11 NOTE — Progress Notes (Signed)
Patient Care Team: Sherrilee Gilles, DO as PCP - General (Family Medicine)  DIAGNOSIS:  Encounter Diagnosis  Name Primary?  . Malignant neoplasm of upper-outer quadrant of left breast in female, estrogen receptor positive ([place])     SUMMARY OF ONCOLOGIC HISTORY:   Malignant neoplasm of upper-outer quadrant of left breast in female, estrogen receptor positive ([place])   05/13/2017 Initial Diagnosis    Breanna [name] biopsy: Left breast biopsy 2 o'clock position 8 cm from nipple ultrasound-guided biopsy: Grade 2 IDC, ER 50%, PR 3%, HER-2 equivocal Ki-67 15% nipple area biopsy grade 2 IDC, ER 100% positive, PR negative, HER-2 equivocal, Ki-67 40%      05/31/2017 Breast MRI    Left breast UOQ 230 position: 2 x 1.9 x 1.9 cm mass, second mass left breast UOQ 230 position: 1 x 1 x 0.7 cm, 2 more enhancing adjacent 3 and 5 mm nodules slightly superiorly midway between the 2 masses.  No abnormal lymph nodes, right breast normal      06/11/2017 Surgery    Left Mastectomy: 2 foci of IDC 2.2 and 1 cm Grade 2, 1/2 LN Positive; Grade 2 IDC, ER 50%, PR 3%, HER-2 equivocal Ki-67 15% nipple area biopsy grade 2 IDC, ER 100% positive, PR negative, HER-2 equivocal, Ki-67 40% T2N1a Stage 1B      06/25/2017 Miscellaneous    Mammaprint high risk luminal type B      07/23/2017 -  Chemotherapy    Adjuvant chemotherapy with dose dense Adriamycin and Cytoxan x4 followed by Taxol weekly x12        CHIEF COMPLIANT: Cycle 2 dose dense Adriamycin and Cytoxan  INTERVAL HISTORY: Breanna [name] is a 72 year old with above-mentioned history of left breast cancer treated with mastectomy and is currently on adjuvant chemotherapy today is cycle 2 of dose dense indomethacin and Cytoxan.  She had one episode of intense bone pain that lasted 2 hours it was so excruciating it made her completely incapacitated.  She took some Percocet which seemed to help.  Other than that she had done fairly well.  She has had no further  problems with bone pain since then.  She is very anxious and afraid of the bone pain for the cycle.  She only took Claritin for 3 days after cycle 1.  REVIEW OF SYSTEMS:   Constitutional: Denies fevers, chills or abnormal weight loss Eyes: Denies blurriness of vision Ears, nose, mouth, throat, and face: Denies mucositis or sore throat Respiratory: Shortness of breath, uses oxygen by nasal cannula Cardiovascular: Denies palpitation, chest discomfort Gastrointestinal:  Denies nausea, heartburn or change in bowel habits Skin: Denies abnormal skin rashes Lymphatics: Denies new lymphadenopathy or easy bruising Neurological:Denies numbness, tingling or new weaknesses Behavioral/Psych: Mood is stable, no new changes  Extremities: No lower extremity edema  All other systems were reviewed with the patient and are negative.  I have reviewed the past medical history, past surgical history, social history and family history with the patient and they are unchanged from previous note.  ALLERGIES:  is allergic to atorvastatin; ancef [cefazolin]; vancomycin; lactose; mometasone; nasonex [mometasone furoate]; and neurontin [gabapentin].  MEDICATIONS:  Current Outpatient Medications  Medication Sig Dispense Refill  . acetaminophen (TYLENOL) 500 MG tablet Take 500 mg by mouth every 6 (six) hours as needed for moderate pain or headache.     Marland Kitchen amLODipine (NORVASC) 5 MG tablet Take 5 mg by mouth daily.    . B Complex-C (SUPER B COMPLEX PO) Take 1 tablet by mouth  daily.    . carvedilol (COREG) 25 MG tablet Take 25 mg by mouth 2 (two) times daily with a meal.    . cetirizine (ZYRTEC) 10 MG tablet Take 10 mg by mouth daily as needed for allergies.     Marland Kitchen clopidogrel (PLAVIX) 75 MG tablet Take 75 mg by mouth daily.    . ferrous sulfate 325 (65 FE) MG tablet Take 325 mg by mouth daily with breakfast.    . fluticasone (FLONASE) 50 MCG/ACT nasal spray Place 1 spray into both nostrils daily as needed for allergies.      . hydrochlorothiazide (HYDRODIURIL) 25 MG tablet Take 25 mg by mouth daily.    . lansoprazole (PREVACID) 15 MG capsule Take 15 mg by mouth daily.     Marland Kitchen lidocaine-prilocaine (EMLA) cream Apply to affected area once 30 g 3  . lisinopril (PRINIVIL,ZESTRIL) 40 MG tablet Take 40 mg by mouth daily.    Marland Kitchen LORazepam (ATIVAN) 0.5 MG tablet Take 1 tablet (0.5 mg total) by mouth at bedtime as needed (Nausea or vomiting). 30 tablet 0  . montelukast (SINGULAIR) 10 MG tablet Take 10 mg by mouth daily.    . ondansetron (ZOFRAN) 8 MG tablet Take 1 tablet (8 mg total) by mouth 2 (two) times daily as needed. Start on the third day after chemotherapy. 30 tablet 1  . Polyethyl Glycol-Propyl Glycol (SYSTANE OP) Place 1 drop into both eyes daily as needed (for dry eyes).     . potassium chloride (K-DUR,KLOR-CON) 10 MEQ tablet Take 10 mEq by mouth daily.     . prochlorperazine (COMPAZINE) 10 MG tablet Take 1 tablet (10 mg total) by mouth every 6 (six) hours as needed (Nausea or vomiting). 30 tablet 1  . traMADol (ULTRAM) 50 MG tablet Take 2 tablets (100 mg total) by mouth every 6 (six) hours as needed (mild pain). (Patient taking differently: Take 50 mg by mouth every 6 (six) hours as needed (mild pain). ) 20 tablet 0  . venlafaxine XR (EFFEXOR-XR) 37.5 MG 24 hr capsule Take 1 capsule (37.5 mg total) by mouth daily with breakfast. 30 capsule 3   No current facility-administered medications for this visit.     PHYSICAL EXAMINATION: ECOG PERFORMANCE STATUS: 1 - Symptomatic but completely ambulatory  Vitals:   08/11/17 1038  BP: 119/64  Pulse: (!) 54  Resp: 17  Temp: 98.1 F (36.7 C)  SpO2: 97%   Filed Weights   08/11/17 1038  Weight: 212 lb 1.6 oz (96.2 kg)    GENERAL:alert, no distress and comfortable SKIN: skin color, texture, turgor are normal, no rashes or significant lesions EYES: normal, Conjunctiva are pink and non-injected, sclera clear OROPHARYNX:no exudate, no erythema and lips, buccal  mucosa, and tongue normal  NECK: supple, thyroid normal size, non-tender, without nodularity LYMPH:  no palpable lymphadenopathy in the cervical, axillary or inguinal LUNGS: clear to auscultation and percussion with normal breathing effort HEART: regular rate & rhythm and no murmurs and no lower extremity edema ABDOMEN:abdomen soft, non-tender and normal bowel sounds MUSCULOSKELETAL:no cyanosis of digits and no clubbing  NEURO: alert & oriented x 3 with fluent speech, no focal motor/sensory deficits EXTREMITIES: No lower extremity edema  LABORATORY DATA:  I have reviewed the data as listed CMP Latest Ref Rng & Units 08/11/2017 07/30/2017 07/23/2017  Glucose 70 - 140 mg/dL 114 141(H) 110  BUN 7 - 26 mg/dL '19 16 10  '$ Creatinine 0.60 - 1.10 mg/dL 0.72 0.68 0.69  Sodium 136 - 145 mmol/L 138  138 141  Potassium 3.5 - 5.1 mmol/L 3.9 4.0 3.9  Chloride 98 - 109 mmol/L 99 100 102  CO2 22 - 29 mmol/L 30(H) 30(H) 33(H)  Calcium 8.4 - 10.4 mg/dL 9.4 9.1 8.9  Total Protein 6.4 - 8.3 g/dL 6.7 6.2(L) 6.3(L)  Total Bilirubin 0.2 - 1.2 mg/dL <0.2(L) 0.3 0.3  Alkaline Phos 40 - 150 U/L 72 92 71  AST 5 - 34 U/L 37(H) 24 30  ALT 0 - 55 U/L 32 27 27    Lab Results  Component Value Date   WBC 7.5 08/11/2017   HGB 11.4 (L) 08/11/2017   HCT 34.6 (L) 08/11/2017   MCV 94.8 08/11/2017   PLT 198 08/11/2017   NEUTROABS 4.7 08/11/2017    ASSESSMENT & PLAN:  Malignant neoplasm of upper-outer quadrant of left breast in female, estrogen receptor positive ([place]) 05/13/17: Breanna [name] biopsy: Left breast biopsy 2 o'clock position 8 cm from nipple ultrasound-guided biopsy: Grade 2 IDC, ER 50%, PR 3%, HER-2 equivocal Ki-67 15% nipple area biopsy grade 2 IDC, ER 100% positive, PR negative, HER-2 equivocal, Ki-67 40%  06/11/17:Left Mastectomy: 2 foci of IDC 2.2 and 1 cm Grade 2, 1/2 LN Positive; Grade 2 IDC, ER 50%, PR 3%, HER-2 equivocal Ki-67 15% nipple area biopsy grade 2 IDC, ER 100% positive, PR negative, HER-2  equivocal, Ki-67 40% T2N1a Stage 1B I discussed with her that given the fact that she has lymph node positive disease, this is clinically high risk disease.  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, potential benefit of treatment at 5 years: 94.6% distant metastasis free interval for patients treated with chemotherapy  Recommendation: 1.Adjuvant chemotherapy with dose dense Adriamycin and Cytoxan x4 followed by Taxol weekly x12 2.followed by radiation 3.Followed by adjuvant antiestrogen therapy ----------------------------------------------------------------------- Current treatment: Cycle 2 day 1 dose dense Adriamycin Cytoxan  Chemo Toxicities: 1.  Nausea for first couple of days  2. fatigue  B[facility] pain very intense from [facility]  Encouraged her to take Claritin every day for the next 2 weeks.  I also discussed with her about taking Tylenol or tramadol when she starts getting the bone pain.  She also has a few Percocets from previously that she can use as needed.  Labs have been reviewed. I will continue to maintain her on the same dosage of chemotherapy.  RTC in 2 weeks for cycle 3  No orders of the defined types were placed in this encounter.  The patient has a good understanding of the overall plan. she agrees with it. she will call with any problems that may develop before the next visit here.   Breanna Ohara, MD 08/11/17

## 2017-08-12 ENCOUNTER — Telehealth: Payer: Self-pay | Admitting: Hematology and Oncology

## 2017-08-12 NOTE — Telephone Encounter (Signed)
Cancel injections per Breanna [name] and patient she has a patch

## 2017-08-13 ENCOUNTER — Inpatient Hospital Stay: Payer: Medicare Other

## 2017-08-19 ENCOUNTER — Other Ambulatory Visit: Payer: Self-pay | Admitting: Hematology and Oncology

## 2017-08-19 ENCOUNTER — Ambulatory Visit: Payer: Medicare Other

## 2017-08-19 ENCOUNTER — Ambulatory Visit: Payer: Medicare Other | Admitting: Adult Health

## 2017-08-19 ENCOUNTER — Other Ambulatory Visit: Payer: Medicare Other

## 2017-08-19 DIAGNOSIS — Z170 Estrogen receptor positive status [ER+]: Principal | ICD-10-CM

## 2017-08-19 DIAGNOSIS — C50412 Malignant neoplasm of upper-outer quadrant of left female breast: Secondary | ICD-10-CM

## 2017-08-25 ENCOUNTER — Inpatient Hospital Stay: Payer: Medicare Other

## 2017-08-25 ENCOUNTER — Inpatient Hospital Stay (HOSPITAL_BASED_OUTPATIENT_CLINIC_OR_DEPARTMENT_OTHER): Payer: Medicare Other | Admitting: Hematology and Oncology

## 2017-08-25 DIAGNOSIS — Z9012 Acquired absence of left breast and nipple: Secondary | ICD-10-CM | POA: Diagnosis not present

## 2017-08-25 DIAGNOSIS — Z170 Estrogen receptor positive status [ER+]: Principal | ICD-10-CM

## 2017-08-25 DIAGNOSIS — C50412 Malignant neoplasm of upper-outer quadrant of left female breast: Secondary | ICD-10-CM

## 2017-08-25 DIAGNOSIS — Z79899 Other long term (current) drug therapy: Secondary | ICD-10-CM

## 2017-08-25 DIAGNOSIS — Z5111 Encounter for antineoplastic chemotherapy: Secondary | ICD-10-CM | POA: Diagnosis not present

## 2017-08-25 LAB — CMP (CANCER CENTER ONLY)
ALBUMIN: 3.5 g/dL (ref 3.5–5.0)
ALK PHOS: 101 U/L (ref 40–150)
ALT: 24 U/L (ref 0–55)
AST: 22 U/L (ref 5–34)
Anion gap: 6 (ref 3–11)
BUN: 11 mg/dL (ref 7–26)
CALCIUM: 9 mg/dL (ref 8.4–10.4)
CHLORIDE: 102 mmol/L (ref 98–109)
CO2: 33 mmol/L — AB (ref 22–29)
CREATININE: 0.73 mg/dL (ref 0.60–1.10)
GFR, Est AFR Am: >60 mL/min (ref >60)
GFR, Estimated: >60 mL/min (ref >60)
GLUCOSE: 116 mg/dL (ref 70–140)
Potassium: 3.7 mmol/L (ref 3.5–5.1)
SODIUM: 141 mmol/L (ref 136–145)
Total Bilirubin: <0.2 mg/dL — ABNORMAL LOW (ref 0.2–1.2)
Total Protein: 6.3 g/dL — ABNORMAL LOW (ref 6.4–8.3)

## 2017-08-25 LAB — CBC WITH DIFFERENTIAL (CANCER CENTER ONLY)
BASOS ABS: 0.1 10*3/uL (ref 0.0–0.1)
BASOS PCT: 1 %
EOS ABS: 0.1 10*3/uL (ref 0.0–0.5)
EOS PCT: 1 %
HCT: 33.9 % — ABNORMAL LOW (ref 34.8–46.6)
HEMOGLOBIN: 11 g/dL — AB (ref 11.6–15.9)
LYMPHS ABS: 1.4 10*3/uL (ref 0.9–3.3)
Lymphocytes Relative: 22 %
MCH: 31.4 pg (ref 25.1–34.0)
MCHC: 32.4 g/dL (ref 31.5–36.0)
MCV: 96.9 fL (ref 79.5–101.0)
Monocytes Absolute: 0.6 10*3/uL (ref 0.1–0.9)
Monocytes Relative: 10 %
NEUTROS PCT: 66 %
Neutro Abs: 4.3 10*3/uL (ref 1.5–6.5)
PLATELETS: 103 10*3/uL — AB (ref 145–400)
RBC: 3.5 MIL/uL — AB (ref 3.70–5.45)
RDW: 14.1 % (ref 11.2–14.5)
WBC: 6.4 10*3/uL (ref 3.9–10.3)

## 2017-08-25 MED ORDER — AZITHROMYCIN 250 MG PO TABS: ORAL_TABLET | ORAL | 0 refills | Status: DC

## 2017-08-25 MED ORDER — PEGFILGRASTIM 6 MG/0.6ML ~~LOC~~ PSKT
PREFILLED_SYRINGE | SUBCUTANEOUS | Status: AC
  Filled 2017-08-25: qty 0.6

## 2017-08-25 MED ORDER — PALONOSETRON HCL INJECTION 0.25 MG/5ML
0.2500 mg | Freq: Once | INTRAVENOUS | Status: AC
  Administered 2017-08-25: 0.25 mg via INTRAVENOUS

## 2017-08-25 MED ORDER — FOSAPREPITANT DIMEGLUMINE INJECTION 150 MG
Freq: Once | INTRAVENOUS | Status: AC
  Administered 2017-08-25: 12:00:00 via INTRAVENOUS
  Filled 2017-08-25: qty 5

## 2017-08-25 MED ORDER — PALONOSETRON HCL INJECTION 0.25 MG/5ML
INTRAVENOUS | Status: AC
  Filled 2017-08-25: qty 5

## 2017-08-25 MED ORDER — HEPARIN SOD (PORK) LOCK FLUSH 100 UNIT/ML IV SOLN
500.0000 [IU] | Freq: Once | INTRAVENOUS | Status: AC | PRN
  Administered 2017-08-25: 500 [IU]
  Filled 2017-08-25: qty 5

## 2017-08-25 MED ORDER — DOXORUBICIN HCL CHEMO IV INJECTION 2 MG/ML
50.0000 mg/m2 | Freq: Once | INTRAVENOUS | Status: AC
  Administered 2017-08-25: 106 mg via INTRAVENOUS
  Filled 2017-08-25: qty 53

## 2017-08-25 MED ORDER — SODIUM CHLORIDE 0.9 % IV SOLN
Freq: Once | INTRAVENOUS | Status: AC
  Administered 2017-08-25: 12:00:00 via INTRAVENOUS

## 2017-08-25 MED ORDER — PEGFILGRASTIM 6 MG/0.6ML ~~LOC~~ PSKT
6.0000 mg | PREFILLED_SYRINGE | Freq: Once | SUBCUTANEOUS | Status: AC
Start: 2017-08-25 — End: 2017-08-25
  Administered 2017-08-25: 6 mg via SUBCUTANEOUS

## 2017-08-25 MED ORDER — SODIUM CHLORIDE 0.9 % IV SOLN
500.0000 mg/m2 | Freq: Once | INTRAVENOUS | Status: AC
  Administered 2017-08-25: 1060 mg via INTRAVENOUS
  Filled 2017-08-25: qty 53

## 2017-08-25 MED ORDER — SODIUM CHLORIDE 0.9% FLUSH
10.0000 mL | INTRAVENOUS | Status: DC | PRN
  Administered 2017-08-25: 10 mL
  Filled 2017-08-25: qty 10

## 2017-08-25 NOTE — Assessment & Plan Note (Signed)
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, potential benefit of treatment at 5 years: 94.6% distant metastasis free interval for patients treated with chemotherapy  Recommendation: 1.Adjuvant chemotherapy with dose dense Adriamycin and Cytoxan x4 followed by Taxol weekly x12 2.followed by radiation 3.Followed by adjuvant antiestrogen therapy ----------------------------------------------------------------------- Current treatment: Cycle 3 day 1dose dense Adriamycin Cytoxan  Chemo Toxicities: 1.Nausea for first couple of days  2.fatigue  3. Bone pain very intense from Neulasta  Monitoring closely for toxicities Return 2 weeks for cycle 4 of Adriamycin and Cytoxan

## 2017-08-25 NOTE — Progress Notes (Signed)
Patient Care Team: Sherrilee Gilles, DO as PCP - General (Family Medicine)  DIAGNOSIS:  Encounter Diagnosis  Name Primary?  . Malignant neoplasm of upper-outer quadrant of left breast in female, estrogen receptor positive ([place])     SUMMARY OF ONCOLOGIC HISTORY:   Malignant neoplasm of upper-outer quadrant of left breast in female, estrogen receptor positive ([place])   05/13/2017 Initial Diagnosis    [place] biopsy: Left breast biopsy 2 o'clock position 8 cm from nipple ultrasound-guided biopsy: Grade 2 IDC, ER 50%, PR 3%, HER-2 equivocal Ki-67 15% nipple area biopsy grade 2 IDC, ER 100% positive, PR negative, HER-2 equivocal, Ki-67 40%      05/31/2017 Breast MRI    Left breast UOQ 230 position: 2 x 1.9 x 1.9 cm mass, second mass left breast UOQ 230 position: 1 x 1 x 0.7 cm, 2 more enhancing adjacent 3 and 5 mm nodules slightly superiorly midway between the 2 masses.  No abnormal lymph nodes, right breast normal      06/11/2017 Surgery    Left Mastectomy: 2 foci of IDC 2.2 and 1 cm Grade 2, 1/2 LN Positive; Grade 2 IDC, ER 50%, PR 3%, HER-2 equivocal Ki-67 15% nipple area biopsy grade 2 IDC, ER 100% positive, PR negative, HER-2 equivocal, Ki-67 40% T2N1a Stage 1B      06/25/2017 Miscellaneous    Mammaprint high risk luminal type B      07/23/2017 -  Chemotherapy    Adjuvant chemotherapy with dose dense Adriamycin and Cytoxan x4 followed by Taxol weekly x12        CHIEF COMPLIANT: Cycle 3 dose dense Adriamycin and Cytoxan  INTERVAL HISTORY: Breanna [name] is a 72 year old with above-mentioned history of left breast cancer treated with mastectomy and is currently on adjuvant chemotherapy and today is cycle 3 of dose dense Adriamycin and Cytoxan.  Overall she is tolerating chemo reasonably well.  She did have nausea after the last cycle of chemo as well as fatigue.  She did not have any significant bone pain.  REVIEW OF SYSTEMS:   Constitutional: Denies fevers, chills or abnormal  weight loss Eyes: Denies blurriness of vision Ears, nose, mouth, throat, and face: Denies mucositis or sore throat Respiratory: Uses oxygen by nasal cannula Cardiovascular: Denies palpitation, chest discomfort Gastrointestinal: Nausea that lasted 5 to 7 days after chemo Skin: Denies abnormal skin rashes Lymphatics: Denies new lymphadenopathy or easy bruising Neurological:Denies numbness, tingling or new weaknesses Behavioral/Psych: Mood is stable, no new changes  Extremities: No lower extremity edema Breast:  denies any pain or lumps or nodules in either breasts All other systems were reviewed with the patient and are negative.  I have reviewed the past medical history, past surgical history, social history and family history with the patient and they are unchanged from previous note.  ALLERGIES:  is allergic to atorvastatin; ancef [cefazolin]; vancomycin; lactose; mometasone; nasonex [mometasone furoate]; and neurontin [gabapentin].  MEDICATIONS:  Current Outpatient Medications  Medication Sig Dispense Refill  . acetaminophen (TYLENOL) 500 MG tablet Take 500 mg by mouth every 6 (six) hours as needed for moderate pain or headache.     Marland Kitchen amLODipine (NORVASC) 5 MG tablet Take 5 mg by mouth daily.    Marland Kitchen azithromycin (ZITHROMAX Z-PAK) 250 MG tablet Take as indicated on the box 6 each 0  . B Complex-C (SUPER B COMPLEX PO) Take 1 tablet by mouth daily.    . carvedilol (COREG) 25 MG tablet Take 25 mg by mouth 2 (two) times daily  with a meal.    . cetirizine (ZYRTEC) 10 MG tablet Take 10 mg by mouth daily as needed for allergies.     Marland Kitchen clopidogrel (PLAVIX) 75 MG tablet Take 75 mg by mouth daily.    Marland Kitchen dexamethasone (DECADRON) 4 MG tablet  TAKE 1 TABLET BY MOUTH THE DAY AFTER CHEMO AND 1 TABLET 2 DAYS AFTER CHEMO WITH FOOD 8 tablet 0  . ferrous sulfate 325 (65 FE) MG tablet Take 325 mg by mouth daily with breakfast.    . fluticasone (FLONASE) 50 MCG/ACT nasal spray Place 1 spray into both nostrils  daily as needed for allergies.     . hydrochlorothiazide (HYDRODIURIL) 25 MG tablet Take 25 mg by mouth daily.    . lansoprazole (PREVACID) 15 MG capsule Take 15 mg by mouth daily.     Marland Kitchen lidocaine-prilocaine (EMLA) cream Apply to affected area once 30 g 3  . lisinopril (PRINIVIL,ZESTRIL) 40 MG tablet Take 40 mg by mouth daily.    Marland Kitchen LORazepam (ATIVAN) 0.5 MG tablet Take 1 tablet (0.5 mg total) by mouth at bedtime as needed (Nausea or vomiting). 30 tablet 0  . montelukast (SINGULAIR) 10 MG tablet Take 10 mg by mouth daily.    . ondansetron (ZOFRAN) 8 MG tablet Take 1 tablet (8 mg total) by mouth 2 (two) times daily as needed. Start on the third day after chemotherapy. 30 tablet 1  . Polyethyl Glycol-Propyl Glycol (SYSTANE OP) Place 1 drop into both eyes daily as needed (for dry eyes).     . potassium chloride (K-DUR,KLOR-CON) 10 MEQ tablet Take 10 mEq by mouth daily.     . prochlorperazine (COMPAZINE) 10 MG tablet Take 1 tablet (10 mg total) by mouth every 6 (six) hours as needed (Nausea or vomiting). 30 tablet 1  . traMADol (ULTRAM) 50 MG tablet Take 2 tablets (100 mg total) by mouth every 6 (six) hours as needed (mild pain). (Patient taking differently: Take 50 mg by mouth every 6 (six) hours as needed (mild pain). ) 20 tablet 0   No current facility-administered medications for this visit.    Facility-Administered Medications Ordered in Other Visits  Medication Dose Route Frequency Provider Last Rate Last Dose  . sodium chloride flush (NS) 0.9 % injection 10 mL  10 mL Intracatheter PRN Nicholas Lose, MD   10 mL at 08/25/17 1425    PHYSICAL EXAMINATION: ECOG PERFORMANCE STATUS: 1 - Symptomatic but completely ambulatory  Vitals:   08/25/17 1100  BP: 121/63  Pulse: 66  Resp: 17  Temp: 98 F (36.7 C)  SpO2: 98%   Filed Weights   08/25/17 1100  Weight: 214 lb 12.8 oz (97.4 kg)    GENERAL:alert, no distress and comfortable SKIN: skin color, texture, turgor are normal, no rashes or  significant lesions EYES: normal, Conjunctiva are pink and non-injected, sclera clear OROPHARYNX:no exudate, no erythema and lips, buccal mucosa, and tongue normal  NECK: supple, thyroid normal size, non-tender, without nodularity LYMPH:  no palpable lymphadenopathy in the cervical, axillary or inguinal LUNGS: clear to auscultation and percussion with normal breathing effort HEART: regular rate & rhythm and no murmurs and no lower extremity edema ABDOMEN:abdomen soft, non-tender and normal bowel sounds MUSCULOSKELETAL:no cyanosis of digits and no clubbing  NEURO: alert & oriented x 3 with fluent speech, no focal motor/sensory deficits EXTREMITIES: No lower extremity edema  LABORATORY DATA:  I have reviewed the data as listed CMP Latest Ref Rng & Units 08/25/2017 08/11/2017 07/30/2017  Glucose 70 - 140  mg/dL 116 114 141(H)  BUN 7 - 26 mg/dL '11 19 16  '$ Creatinine 0.60 - 1.10 mg/dL 0.73 0.72 0.68  Sodium 136 - 145 mmol/L 141 138 138  Potassium 3.5 - 5.1 mmol/L 3.7 3.9 4.0  Chloride 98 - 109 mmol/L 102 99 100  CO2 22 - 29 mmol/L 33(H) 30(H) 30(H)  Calcium 8.4 - 10.4 mg/dL 9.0 9.4 9.1  Total Protein 6.4 - 8.3 g/dL 6.3(L) 6.7 6.2(L)  Total Bilirubin 0.2 - 1.2 mg/dL <0.2(L) <0.2(L) 0.3  Alkaline Phos 40 - 150 U/L 101 72 92  AST 5 - 34 U/L 22 37(H) 24  ALT 0 - 55 U/L 24 32 27    Lab Results  Component Value Date   WBC 6.4 08/25/2017   HGB 11.0 (L) 08/25/2017   HCT 33.9 (L) 08/25/2017   MCV 96.9 08/25/2017   PLT 103 (L) 08/25/2017   NEUTROABS 4.3 08/25/2017    ASSESSMENT & PLAN:  Malignant neoplasm of upper-outer quadrant of left breast in female, estrogen receptor positive ([place]) 05/13/17: [place] biopsy: Left breast biopsy 2 o'clock position 8 cm from nipple ultrasound-guided biopsy: Grade 2 IDC, ER 50%, PR 3%, HER-2 equivocal Ki-67 15% nipple area biopsy grade 2 IDC, ER 100% positive, PR negative, HER-2 equivocal, Ki-67 40%  06/11/17:Left Mastectomy: 2 foci of IDC 2.2 and 1 cm  Grade 2, 1/2 LN Positive; Grade 2 IDC, ER 50%, PR 3%, HER-2 equivocal Ki-67 15% nipple area biopsy grade 2 IDC, ER 100% positive, PR negative, HER-2 equivocal, Ki-67 40% T2N1a Stage 1B I discussed with her that given the fact that she has lymph node positive disease, this is clinically high risk disease.  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, potential benefit of treatment at 5 years: 94.6% distant metastasis free interval for patients treated with chemotherapy  Recommendation: 1.Adjuvant chemotherapy with dose dense Adriamycin and Cytoxan x4 followed by Taxol weekly x12 2.followed by radiation 3.Followed by adjuvant antiestrogen therapy ----------------------------------------------------------------------- Current treatment: Cycle 3 day 1dose dense Adriamycin Cytoxan  Chemo Toxicities: 1.Nausea for first couple of days  2.fatigue  3. Bone pain very intense from Neulasta  Monitoring closely for toxicities Return 2 weeks for cycle 4 of Adriamycin and Cytoxan  No orders of the defined types were placed in this encounter.  The patient has a good understanding of the overall plan. she agrees with it. she will call with any problems that may develop before the next visit here.   Harriette Ohara, MD 08/25/17

## 2017-08-25 NOTE — Patient Instructions (Signed)
[facility] Discharge Instructions for Patients Receiving Chemotherapy  Today you received the following chemotherapy agents Adriamycin, Cytoxan.  To help prevent nausea and vomiting after your treatment, we encourage you to take your nausea medication as prescribed.   If you develop nausea and vomiting that is not controlled by your nausea medication, call the clinic.   BELOW ARE SYMPTOMS THAT SHOULD BE REPORTED IMMEDIATELY:  *FEVER GREATER THAN 100.5 F  *CHILLS WITH OR WITHOUT FEVER  NAUSEA AND VOMITING THAT IS NOT CONTROLLED WITH YOUR NAUSEA MEDICATION  *UNUSUAL SHORTNESS OF BREATH  *UNUSUAL BRUISING OR BLEEDING  TENDERNESS IN MOUTH AND THROAT WITH OR WITHOUT PRESENCE OF ULCERS  *URINARY PROBLEMS  *BOWEL PROBLEMS  UNUSUAL RASH Items with * indicate a potential emergency and should be followed up as soon as possible.  Feel free to call the clinic should you have any questions or concerns. The clinic phone number is [phone].  Please show the CHEMO ALERT CARD at check-in to the Emergency Department and triage nurse.   

## 2017-08-27 ENCOUNTER — Ambulatory Visit: Payer: Medicare Other

## 2017-09-02 ENCOUNTER — Ambulatory Visit: Payer: Medicare Other | Admitting: Hematology and Oncology

## 2017-09-02 ENCOUNTER — Ambulatory Visit: Payer: Medicare Other

## 2017-09-02 ENCOUNTER — Other Ambulatory Visit: Payer: Medicare Other

## 2017-09-03 ENCOUNTER — Telehealth: Payer: Self-pay | Admitting: Hematology and Oncology

## 2017-09-03 ENCOUNTER — Telehealth: Payer: Self-pay

## 2017-09-03 NOTE — Telephone Encounter (Signed)
Returned pt's call regarding she is having pain in the balls of her feet x 2 days, it is red- inflammed and she wonders if it is from bone pain.  She is using neosporin with pain relief and wrapping feet with bandage and that is helping. She is also taking Claritin daily, her Tramadol and Tylenol as needed and that helps. Pt also reports her abd area is swollen, denies constipation and swelling in tops of feet and ankles are swollen-like there is an indention from her shoes.  This started 5-6 days ago.  Asked if she is drinking much water, pt said not really.  Reports she was on her feet a lot yesterday, doing chores in house and that might account for some increased pain in balls of feet.  Encouraged her to increase her water intake.  Notified Dr Lindi Adie.  He would patient to have appt next week, she has appt on July 3rd, Wed- he is ok with her keeping that appt.  Instructed pt to monitor her symptoms, call office sooner or if needs earlier appt on Monday or if she feels she needs to be seen sooner, then go to ER over weekend if needed for worsening symptoms like increased swelling or pain, redness, warmth, pain in calf, fever, difficulty breathing.  Pt voiced understanding and no other needs per pt at this time.

## 2017-09-03 NOTE — Telephone Encounter (Signed)
Called regarding schedule °

## 2017-09-08 ENCOUNTER — Inpatient Hospital Stay (HOSPITAL_BASED_OUTPATIENT_CLINIC_OR_DEPARTMENT_OTHER): Payer: Medicare Other | Admitting: Hematology and Oncology

## 2017-09-08 ENCOUNTER — Inpatient Hospital Stay: Payer: Medicare Other

## 2017-09-08 ENCOUNTER — Inpatient Hospital Stay: Payer: Medicare Other | Attending: Hematology and Oncology

## 2017-09-08 DIAGNOSIS — C50412 Malignant neoplasm of upper-outer quadrant of left female breast: Secondary | ICD-10-CM

## 2017-09-08 DIAGNOSIS — K219 Gastro-esophageal reflux disease without esophagitis: Secondary | ICD-10-CM | POA: Insufficient documentation

## 2017-09-08 DIAGNOSIS — M1990 Unspecified osteoarthritis, unspecified site: Secondary | ICD-10-CM | POA: Insufficient documentation

## 2017-09-08 DIAGNOSIS — Z9981 Dependence on supplemental oxygen: Secondary | ICD-10-CM | POA: Insufficient documentation

## 2017-09-08 DIAGNOSIS — Z5111 Encounter for antineoplastic chemotherapy: Secondary | ICD-10-CM | POA: Insufficient documentation

## 2017-09-08 DIAGNOSIS — Z8673 Personal history of transient ischemic attack (TIA), and cerebral infarction without residual deficits: Secondary | ICD-10-CM | POA: Insufficient documentation

## 2017-09-08 DIAGNOSIS — J449 Chronic obstructive pulmonary disease, unspecified: Secondary | ICD-10-CM | POA: Diagnosis not present

## 2017-09-08 DIAGNOSIS — Z9012 Acquired absence of left breast and nipple: Secondary | ICD-10-CM | POA: Diagnosis not present

## 2017-09-08 DIAGNOSIS — Z79899 Other long term (current) drug therapy: Secondary | ICD-10-CM | POA: Insufficient documentation

## 2017-09-08 DIAGNOSIS — J0100 Acute maxillary sinusitis, unspecified: Secondary | ICD-10-CM | POA: Insufficient documentation

## 2017-09-08 DIAGNOSIS — K449 Diaphragmatic hernia without obstruction or gangrene: Secondary | ICD-10-CM | POA: Diagnosis not present

## 2017-09-08 DIAGNOSIS — Z170 Estrogen receptor positive status [ER+]: Secondary | ICD-10-CM | POA: Insufficient documentation

## 2017-09-08 DIAGNOSIS — Z87891 Personal history of nicotine dependence: Secondary | ICD-10-CM | POA: Insufficient documentation

## 2017-09-08 DIAGNOSIS — I10 Essential (primary) hypertension: Secondary | ICD-10-CM | POA: Insufficient documentation

## 2017-09-08 LAB — CBC WITH DIFFERENTIAL (CANCER CENTER ONLY)
Basophils Absolute: 0 10*3/uL (ref 0.0–0.1)
Basophils Relative: 0 %
EOS PCT: 0 %
Eosinophils Absolute: 0 10*3/uL (ref 0.0–0.5)
HEMATOCRIT: 31.7 % — AB (ref 34.8–46.6)
Hemoglobin: 10.5 g/dL — ABNORMAL LOW (ref 11.6–15.9)
LYMPHS ABS: 1.5 10*3/uL (ref 0.9–3.3)
LYMPHS PCT: 15 %
MCH: 31.6 pg (ref 25.1–34.0)
MCHC: 33.3 g/dL (ref 31.5–36.0)
MCV: 94.8 fL (ref 79.5–101.0)
MONO ABS: 0.9 10*3/uL (ref 0.1–0.9)
Monocytes Relative: 10 %
NEUTROS ABS: 7.3 10*3/uL — AB (ref 1.5–6.5)
Neutrophils Relative %: 75 %
PLATELETS: 175 10*3/uL (ref 145–400)
RBC: 3.34 MIL/uL — ABNORMAL LOW (ref 3.70–5.45)
RDW: 15.2 % — AB (ref 11.2–14.5)
WBC Count: 9.8 10*3/uL (ref 3.9–10.3)

## 2017-09-08 LAB — CMP (CANCER CENTER ONLY)
ALT: 30 U/L (ref 0–44)
ANION GAP: 6 (ref 5–15)
AST: 22 U/L (ref 15–41)
Albumin: 3.7 g/dL (ref 3.5–5.0)
Alkaline Phosphatase: 121 U/L (ref 38–126)
BUN: 10 mg/dL (ref 8–23)
CHLORIDE: 101 mmol/L (ref 98–111)
CO2: 33 mmol/L — AB (ref 22–32)
Calcium: 9.1 mg/dL (ref 8.9–10.3)
Creatinine: 0.67 mg/dL (ref 0.44–1.00)
GFR, Est AFR Am: >60 mL/min (ref >60)
GFR, Estimated: >60 mL/min (ref >60)
Glucose, Bld: 123 mg/dL — ABNORMAL HIGH (ref 70–99)
POTASSIUM: 3.9 mmol/L (ref 3.5–5.1)
Sodium: 140 mmol/L (ref 135–145)
Total Bilirubin: <0.2 mg/dL — ABNORMAL LOW (ref 0.3–1.2)
Total Protein: 6.3 g/dL — ABNORMAL LOW (ref 6.5–8.1)

## 2017-09-08 MED ORDER — SODIUM CHLORIDE 0.9 % IV SOLN
Freq: Once | INTRAVENOUS | Status: AC
  Administered 2017-09-08: 12:00:00 via INTRAVENOUS

## 2017-09-08 MED ORDER — SODIUM CHLORIDE 0.9 % IV SOLN
500.0000 mg/m2 | Freq: Once | INTRAVENOUS | Status: AC
  Administered 2017-09-08: 1060 mg via INTRAVENOUS
  Filled 2017-09-08: qty 53

## 2017-09-08 MED ORDER — PEGFILGRASTIM 6 MG/0.6ML ~~LOC~~ PSKT
PREFILLED_SYRINGE | SUBCUTANEOUS | Status: AC
  Filled 2017-09-08: qty 0.6

## 2017-09-08 MED ORDER — TRAMADOL HCL 50 MG PO TABS: 50.0000 mg | ORAL_TABLET | Freq: Four times a day (QID) | ORAL | 3 refills | Status: DC | PRN

## 2017-09-08 MED ORDER — PALONOSETRON HCL INJECTION 0.25 MG/5ML
0.2500 mg | Freq: Once | INTRAVENOUS | Status: AC
  Administered 2017-09-08: 0.25 mg via INTRAVENOUS

## 2017-09-08 MED ORDER — HEPARIN SOD (PORK) LOCK FLUSH 100 UNIT/ML IV SOLN
500.0000 [IU] | Freq: Once | INTRAVENOUS | Status: AC | PRN
  Administered 2017-09-08: 500 [IU]
  Filled 2017-09-08: qty 5

## 2017-09-08 MED ORDER — DOXORUBICIN HCL CHEMO IV INJECTION 2 MG/ML
50.0000 mg/m2 | Freq: Once | INTRAVENOUS | Status: AC
  Administered 2017-09-08: 106 mg via INTRAVENOUS
  Filled 2017-09-08: qty 53

## 2017-09-08 MED ORDER — SODIUM CHLORIDE 0.9% FLUSH
10.0000 mL | INTRAVENOUS | Status: DC | PRN
  Administered 2017-09-08: 10 mL
  Filled 2017-09-08: qty 10

## 2017-09-08 MED ORDER — PALONOSETRON HCL INJECTION 0.25 MG/5ML
INTRAVENOUS | Status: AC
  Filled 2017-09-08: qty 5

## 2017-09-08 MED ORDER — PEGFILGRASTIM 6 MG/0.6ML ~~LOC~~ PSKT
6.0000 mg | PREFILLED_SYRINGE | Freq: Once | SUBCUTANEOUS | Status: AC
  Administered 2017-09-08: 6 mg via SUBCUTANEOUS

## 2017-09-08 MED ORDER — SODIUM CHLORIDE 0.9 % IV SOLN
Freq: Once | INTRAVENOUS | Status: AC
  Administered 2017-09-08: 12:00:00 via INTRAVENOUS
  Filled 2017-09-08: qty 5

## 2017-09-08 NOTE — Patient Instructions (Signed)
[facility] Discharge Instructions for Patients Receiving Chemotherapy  Today you received the following chemotherapy agents Adriamycin and Cytoxan  To help prevent nausea and vomiting after your treatment, we encourage you to take your nausea medication as directed.  If you develop nausea and vomiting that is not controlled by your nausea medication, call the clinic.   BELOW ARE SYMPTOMS THAT SHOULD BE REPORTED IMMEDIATELY:  *FEVER GREATER THAN 100.5 F  *CHILLS WITH OR WITHOUT FEVER  NAUSEA AND VOMITING THAT IS NOT CONTROLLED WITH YOUR NAUSEA MEDICATION  *UNUSUAL SHORTNESS OF BREATH  *UNUSUAL BRUISING OR BLEEDING  TENDERNESS IN MOUTH AND THROAT WITH OR WITHOUT PRESENCE OF ULCERS  *URINARY PROBLEMS  *BOWEL PROBLEMS  UNUSUAL RASH Items with * indicate a potential emergency and should be followed up as soon as possible.  Feel free to call the clinic should you have any questions or concerns. The clinic phone number is [phone].  Please show the CHEMO ALERT CARD at check-in to the Emergency Department and triage nurse.   

## 2017-09-08 NOTE — Assessment & Plan Note (Signed)
05/13/17: [place] biopsy: Left breast biopsy 2 o'clock position 8 cm from nipple ultrasound-guided biopsy: Grade 2 IDC, ER 50%, PR 3%, HER-2 equivocal Ki-67 15% nipple area biopsy grade 2 IDC, ER 100% positive, PR negative, HER-2 equivocal, Ki-67 40%  06/11/17:Left Mastectomy: 2 foci of IDC 2.2 and 1 cm Grade 2, 1/2 LN Positive; Grade 2 IDC, ER 50%, PR 3%, HER-2 equivocal Ki-67 15% nipple area biopsy grade 2 IDC, ER 100% positive, PR negative, HER-2 equivocal, Ki-67 40% T2N1a Stage 1B I discussed with her that given the fact that she has lymph node positive disease, this is clinically high risk disease.  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, potential benefit of treatment at 5 years: 94.6% distant metastasis free interval for patients treated with chemotherapy  Recommendation: 1.Adjuvant chemotherapy with dose dense Adriamycin and Cytoxan x4 followed by Taxol weekly x12 2.followed by radiation 3.Followed by adjuvant antiestrogen therapy ----------------------------------------------------------------------- Current treatment: Cycle 3 day 1dose dense Adriamycin Cytoxan  Chemo Toxicities: 1.Nausea for first couple of days  2.fatigue  3. Bone pain very intense from Neulasta  Monitoring closely for toxicities Return 2 weeks for cycle 4 of Adriamycin and Cytoxan 

## 2017-09-08 NOTE — Progress Notes (Signed)
Patient Care Team: Sherrilee Gilles, DO as PCP - General (Family Medicine)  DIAGNOSIS:  Encounter Diagnosis  Name Primary?  . Malignant neoplasm of upper-outer quadrant of left breast in female, estrogen receptor positive ([place])     SUMMARY OF ONCOLOGIC HISTORY:   Malignant neoplasm of upper-outer quadrant of left breast in female, estrogen receptor positive ([place])   05/13/2017 Initial Diagnosis    [place] biopsy: Left breast biopsy 2 o'clock position 8 cm from nipple ultrasound-guided biopsy: Grade 2 IDC, ER 50%, PR 3%, HER-2 equivocal Ki-67 15% nipple area biopsy grade 2 IDC, ER 100% positive, PR negative, HER-2 equivocal, Ki-67 40%      05/31/2017 Breast MRI    Left breast UOQ 230 position: 2 x 1.9 x 1.9 cm mass, second mass left breast UOQ 230 position: 1 x 1 x 0.7 cm, 2 more enhancing adjacent 3 and 5 mm nodules slightly superiorly midway between the 2 masses.  No abnormal lymph nodes, right breast normal      06/11/2017 Surgery    Left Mastectomy: 2 foci of IDC 2.2 and 1 cm Grade 2, 1/2 LN Positive; Grade 2 IDC, ER 50%, PR 3%, HER-2 equivocal Ki-67 15% nipple area biopsy grade 2 IDC, ER 100% positive, PR negative, HER-2 equivocal, Ki-67 40% T2N1a Stage 1B      06/25/2017 Miscellaneous    Mammaprint high risk luminal type B      07/23/2017 -  Chemotherapy    Adjuvant chemotherapy with dose dense Adriamycin and Cytoxan x4 followed by Taxol weekly x12        CHIEF COMPLIANT: 3 dose dense Adriamycin and Cytoxan  INTERVAL HISTORY: Breanna [name] is a 72 year old with above-mentioned his left breast cancer treated with mastectomy and is now on adjuvant chemotherapy.  Today is cycle 3 of her a dose dense Adriamycin and Cytoxan.  She complains of fatigue that lasts for 6 to 7 days.   REVIEW OF SYSTEMS:   Constitutional: Denies fevers, chills or abnormal weight loss Eyes: Denies blurriness of vision Ears, nose, mouth, throat, and face: Denies mucositis or sore  throat Respiratory: Denies cough, dyspnea or wheezes Cardiovascular: Denies palpitation, chest discomfort Gastrointestinal:  Denies nausea, heartburn or change in bowel habits Skin: Denies abnormal skin rashes Lymphatics: Denies new lymphadenopathy or easy bruising Neurological:Denies numbness, tingling or new weaknesses Behavioral/Psych: Mood is stable, no new changes  Extremities: No lower extremity edema Breast:  denies any pain or lumps or nodules in either breasts All other systems were reviewed with the patient and are negative.  I have reviewed the past medical history, past surgical history, social history and family history with the patient and they are unchanged from previous note.  ALLERGIES:  is allergic to atorvastatin; ancef [cefazolin]; vancomycin; lactose; mometasone; nasonex [mometasone furoate]; and neurontin [gabapentin].  MEDICATIONS:  Current Outpatient Medications  Medication Sig Dispense Refill  . acetaminophen (TYLENOL) 500 MG tablet Take 500 mg by mouth every 6 (six) hours as needed for moderate pain or headache.     Marland Kitchen amLODipine (NORVASC) 5 MG tablet Take 5 mg by mouth daily.    Marland Kitchen azithromycin (ZITHROMAX Z-PAK) 250 MG tablet Take as indicated on the box 6 each 0  . B Complex-C (SUPER B COMPLEX PO) Take 1 tablet by mouth daily.    . carvedilol (COREG) 25 MG tablet Take 25 mg by mouth 2 (two) times daily with a meal.    . cetirizine (ZYRTEC) 10 MG tablet Take 10 mg by mouth daily as  needed for allergies.     Marland Kitchen clopidogrel (PLAVIX) 75 MG tablet Take 75 mg by mouth daily.    Marland Kitchen dexamethasone (DECADRON) 4 MG tablet  TAKE 1 TABLET BY MOUTH THE DAY AFTER CHEMO AND 1 TABLET 2 DAYS AFTER CHEMO WITH FOOD 8 tablet 0  . ferrous sulfate 325 (65 FE) MG tablet Take 325 mg by mouth daily with breakfast.    . fluticasone (FLONASE) 50 MCG/ACT nasal spray Place 1 spray into both nostrils daily as needed for allergies.     . hydrochlorothiazide (HYDRODIURIL) 25 MG tablet Take 25 mg  by mouth daily.    . lansoprazole (PREVACID) 15 MG capsule Take 15 mg by mouth daily.     Marland Kitchen lidocaine-prilocaine (EMLA) cream Apply to affected area once 30 g 3  . lisinopril (PRINIVIL,ZESTRIL) 40 MG tablet Take 40 mg by mouth daily.    Marland Kitchen LORazepam (ATIVAN) 0.5 MG tablet Take 1 tablet (0.5 mg total) by mouth at bedtime as needed (Nausea or vomiting). 30 tablet 0  . ondansetron (ZOFRAN) 8 MG tablet Take 1 tablet (8 mg total) by mouth 2 (two) times daily as needed. Start on the third day after chemotherapy. 30 tablet 1  . Polyethyl Glycol-Propyl Glycol (SYSTANE OP) Place 1 drop into both eyes daily as needed (for dry eyes).     . potassium chloride (K-DUR,KLOR-CON) 10 MEQ tablet Take 10 mEq by mouth daily.     . prochlorperazine (COMPAZINE) 10 MG tablet Take 1 tablet (10 mg total) by mouth every 6 (six) hours as needed (Nausea or vomiting). 30 tablet 1  . traMADol (ULTRAM) 50 MG tablet Take 1 tablet (50 mg total) by mouth every 6 (six) hours as needed (mild pain). 60 tablet 3   No current facility-administered medications for this visit.    Facility-Administered Medications Ordered in Other Visits  Medication Dose Route Frequency Provider Last Rate Last Dose  . cyclophosphamide (CYTOXAN) 1,060 mg in sodium chloride 0.9 % 250 mL chemo infusion  500 mg/m2 (Treatment Plan Recorded) Intravenous Once Nicholas Lose, MD      . DOXOrubicin (ADRIAMYCIN) chemo injection 106 mg  50 mg/m2 (Treatment Plan Recorded) Intravenous Once Nicholas Lose, MD      . fosaprepitant (EMEND) 150 mg, dexamethasone (DECADRON) 12 mg in sodium chloride 0.9 % 145 mL IVPB   Intravenous Once Nicholas Lose, MD 454 mL/hr at 09/08/17 1219    . heparin lock flush 100 unit/mL  500 Units Intracatheter Once PRN Nicholas Lose, MD      . pegfilgrastim (NEULASTA ONPRO KIT) injection 6 mg  6 mg Subcutaneous Once Nicholas Lose, MD      . sodium chloride flush (NS) 0.9 % injection 10 mL  10 mL Intracatheter PRN Nicholas Lose, MD         PHYSICAL EXAMINATION: ECOG PERFORMANCE STATUS: 1 - Symptomatic but completely ambulatory  Vitals:   09/08/17 1112  BP: (!) 103/56  Pulse: 62  Resp: 16  Temp: 97.7 F (36.5 C)  SpO2: 97%   Filed Weights   09/08/17 1112  Weight: 213 lb 14.4 oz (97 kg)    GENERAL:alert, no distress and comfortable SKIN: skin color, texture, turgor are normal, no rashes or significant lesions EYES: normal, Conjunctiva are pink and non-injected, sclera clear OROPHARYNX:no exudate, no erythema and lips, buccal mucosa, and tongue normal  NECK: supple, thyroid normal size, non-tender, without nodularity LYMPH:  no palpable lymphadenopathy in the cervical, axillary or inguinal LUNGS: clear to auscultation and percussion with normal  breathing effort HEART: regular rate & rhythm and no murmurs and no lower extremity edema ABDOMEN:abdomen soft, non-tender and normal bowel sounds MUSCULOSKELETAL:no cyanosis of digits and no clubbing  NEURO: alert & oriented x 3 with fluent speech, no focal motor/sensory deficits EXTREMITIES: No lower extremity edema  LABORATORY DATA:  I have reviewed the data as listed CMP Latest Ref Rng & Units 09/08/2017 08/25/2017 08/11/2017  Glucose 70 - 99 mg/dL 123(H) 116 114  BUN 8 - 23 mg/dL _0 Creatinine 0.44 - 1.00 mg/dL 0.67 0.73 0.72  Sodium 135 - 145 mmol/L 140 141 138  Potassium 3.5 - 5.1 mmol/L 3.9 3.7 3.9  Chloride 98 - 111 mmol/L 101 102 99  CO2 22 - 32 mmol/L 33(H) 33(H) 30(H)  Calcium 8.9 - 10.3 mg/dL 9.1 9.0 9.4  Total Protein 6.5 - 8.1 g/dL 6.3(L) 6.3(L) 6.7  Total Bilirubin 0.3 - 1.2 mg/dL <0.2(L) <0.2(L) <0.2(L)  Alkaline Phos 38 - 126 U/L 121 101 72  AST 15 - 41 U/L 22 22 37(H)  ALT 0 - 44 U/L 30 24 32    Lab Results  Component Value Date   WBC 9.8 09/08/2017   HGB 10.5 (L) 09/08/2017   HCT 31.7 (L) 09/08/2017   MCV 94.8 09/08/2017   PLT 175 09/08/2017   NEUTROABS 7.3 (H) 09/08/2017    ASSESSMENT & PLAN:  Malignant neoplasm of upper-outer  quadrant of left breast in female, estrogen receptor positive ([place]) 05/13/17: [place] biopsy: Left breast biopsy 2 o'clock position 8 cm from nipple ultrasound-guided biopsy: Grade 2 IDC, ER 50%, PR 3%, HER-2 equivocal Ki-67 15% nipple area biopsy grade 2 IDC, ER 100% positive, PR negative, HER-2 equivocal, Ki-67 40%  06/11/17:Left Mastectomy: 2 foci of IDC 2.2 and 1 cm Grade 2, 1/2 LN Positive; Grade 2 IDC, ER 50%, PR 3%, HER-2 equivocal Ki-67 15% nipple area biopsy grade 2 IDC, ER 100% positive, PR negative, HER-2 equivocal, Ki-67 40% T2N1a Stage 1B I discussed with her that given the fact that she has lymph node positive disease, this is clinically high risk disease.  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, potential benefit of treatment at 5 years: 94.6% distant metastasis free interval for patients treated with chemotherapy  Recommendation: 1.Adjuvant chemotherapy with dose dense Adriamycin and Cytoxan x4 followed by Taxol weekly x12 2.followed by radiation 3.Followed by adjuvant antiestrogen therapy ----------------------------------------------------------------------- Current treatment: Cycle 3 day 1dose dense Adriamycin Cytoxan  Chemo Toxicities: 1.Nausea for first couple of days  2.fatigue  3. Bone pain very intense from Neulasta  Monitoring closely for toxicities Return 2 weeks for cycle 4 of Adriamycin and Cytoxan   No orders of the defined types were placed in this encounter.  The patient has a good understanding of the overall plan. she agrees with it. she will call with any problems that may develop before the next visit here.   Harriette Ohara, MD 09/08/17

## 2017-09-10 ENCOUNTER — Ambulatory Visit: Payer: Medicare Other

## 2017-09-22 ENCOUNTER — Inpatient Hospital Stay: Payer: Medicare Other

## 2017-09-22 ENCOUNTER — Inpatient Hospital Stay (HOSPITAL_BASED_OUTPATIENT_CLINIC_OR_DEPARTMENT_OTHER): Payer: Medicare Other | Admitting: Adult Health

## 2017-09-22 ENCOUNTER — Encounter: Payer: Self-pay | Admitting: Adult Health

## 2017-09-22 VITALS — BP 123/55 | HR 66 | Temp 98.6°F | Resp 18 | Ht 65.0 in | Wt 216.3 lb

## 2017-09-22 VITALS — BP 149/73 | HR 66 | Temp 98.2°F | Resp 18

## 2017-09-22 DIAGNOSIS — Z170 Estrogen receptor positive status [ER+]: Secondary | ICD-10-CM

## 2017-09-22 DIAGNOSIS — C50412 Malignant neoplasm of upper-outer quadrant of left female breast: Secondary | ICD-10-CM

## 2017-09-22 DIAGNOSIS — Z9012 Acquired absence of left breast and nipple: Secondary | ICD-10-CM

## 2017-09-22 DIAGNOSIS — Z5111 Encounter for antineoplastic chemotherapy: Secondary | ICD-10-CM | POA: Diagnosis not present

## 2017-09-22 DIAGNOSIS — R609 Edema, unspecified: Secondary | ICD-10-CM | POA: Diagnosis not present

## 2017-09-22 DIAGNOSIS — Z95828 Presence of other vascular implants and grafts: Secondary | ICD-10-CM

## 2017-09-22 LAB — CBC WITH DIFFERENTIAL (CANCER CENTER ONLY)
Basophils Absolute: 0 10*3/uL (ref 0.0–0.1)
Basophils Relative: 0 %
EOS ABS: 0 10*3/uL (ref 0.0–0.5)
EOS PCT: 0 %
HCT: 28.9 % — ABNORMAL LOW (ref 34.8–46.6)
Hemoglobin: 9.8 g/dL — ABNORMAL LOW (ref 11.6–15.9)
LYMPHS ABS: 1 10*3/uL (ref 0.9–3.3)
Lymphocytes Relative: 12 %
MCH: 32.2 pg (ref 25.1–34.0)
MCHC: 33.9 g/dL (ref 31.5–36.0)
MCV: 95 fL (ref 79.5–101.0)
Monocytes Absolute: 0.9 10*3/uL (ref 0.1–0.9)
Monocytes Relative: 11 %
Neutro Abs: 6.5 10*3/uL (ref 1.5–6.5)
Neutrophils Relative %: 77 %
PLATELETS: 132 10*3/uL — AB (ref 145–400)
RBC: 3.04 MIL/uL — AB (ref 3.70–5.45)
RDW: 17 % — ABNORMAL HIGH (ref 11.2–14.5)
WBC: 8.5 10*3/uL (ref 3.9–10.3)

## 2017-09-22 LAB — CMP (CANCER CENTER ONLY)
ALBUMIN: 3.5 g/dL (ref 3.5–5.0)
ALT: 26 U/L (ref 0–44)
ANION GAP: 6 (ref 5–15)
AST: 20 U/L (ref 15–41)
Alkaline Phosphatase: 115 U/L (ref 38–126)
BUN: 6 mg/dL — ABNORMAL LOW (ref 8–23)
CO2: 32 mmol/L (ref 22–32)
Calcium: 8.9 mg/dL (ref 8.9–10.3)
Chloride: 102 mmol/L (ref 98–111)
Creatinine: 0.68 mg/dL (ref 0.44–1.00)
GFR, Est AFR Am: >60 mL/min (ref >60)
GFR, Estimated: >60 mL/min (ref >60)
GLUCOSE: 129 mg/dL — AB (ref 70–99)
POTASSIUM: 3.7 mmol/L (ref 3.5–5.1)
SODIUM: 140 mmol/L (ref 135–145)
Total Protein: 6.1 g/dL — ABNORMAL LOW (ref 6.5–8.1)

## 2017-09-22 MED ORDER — SODIUM CHLORIDE 0.9 % IV SOLN
20.0000 mg | Freq: Once | INTRAVENOUS | Status: AC
  Administered 2017-09-22: 20 mg via INTRAVENOUS
  Filled 2017-09-22: qty 2

## 2017-09-22 MED ORDER — DIPHENHYDRAMINE HCL 50 MG/ML IJ SOLN
50.0000 mg | Freq: Once | INTRAMUSCULAR | Status: AC
  Administered 2017-09-22: 50 mg via INTRAVENOUS

## 2017-09-22 MED ORDER — FAMOTIDINE IN NACL 20-0.9 MG/50ML-% IV SOLN
INTRAVENOUS | Status: AC
  Filled 2017-09-22: qty 50

## 2017-09-22 MED ORDER — FAMOTIDINE IN NACL 20-0.9 MG/50ML-% IV SOLN
20.0000 mg | Freq: Once | INTRAVENOUS | Status: AC
  Administered 2017-09-22: 20 mg via INTRAVENOUS

## 2017-09-22 MED ORDER — SODIUM CHLORIDE 0.9 % IV SOLN
Freq: Once | INTRAVENOUS | Status: AC
  Administered 2017-09-22: 14:00:00 via INTRAVENOUS

## 2017-09-22 MED ORDER — SODIUM CHLORIDE 0.9% FLUSH
10.0000 mL | INTRAVENOUS | Status: DC | PRN
  Administered 2017-09-22: 10 mL
  Filled 2017-09-22: qty 10

## 2017-09-22 MED ORDER — HEPARIN SOD (PORK) LOCK FLUSH 100 UNIT/ML IV SOLN
500.0000 [IU] | Freq: Once | INTRAVENOUS | Status: AC | PRN
  Administered 2017-09-22: 500 [IU]
  Filled 2017-09-22: qty 5

## 2017-09-22 MED ORDER — SODIUM CHLORIDE 0.9 % IV SOLN
80.0000 mg/m2 | Freq: Once | INTRAVENOUS | Status: AC
  Administered 2017-09-22: 168 mg via INTRAVENOUS
  Filled 2017-09-22: qty 28

## 2017-09-22 MED ORDER — DIPHENHYDRAMINE HCL 50 MG/ML IJ SOLN
INTRAMUSCULAR | Status: AC
  Filled 2017-09-22: qty 1

## 2017-09-22 MED ORDER — PALONOSETRON HCL INJECTION 0.25 MG/5ML
INTRAVENOUS | Status: AC
Start: 2017-09-22 — End: ?
  Filled 2017-09-22: qty 5

## 2017-09-22 NOTE — Assessment & Plan Note (Signed)
05/13/17: [place] biopsy: Left breast biopsy 2 o'clock position 8 cm from nipple ultrasound-guided biopsy: Grade 2 IDC, ER 50%, PR 3%, HER-2 equivocal Ki-67 15% nipple area biopsy grade 2 IDC, ER 100% positive, PR negative, HER-2 equivocal, Ki-67 40%  06/11/17:Left Mastectomy: 2 foci of IDC 2.2 and 1 cm Grade 2, 1/2 LN Positive; Grade 2 IDC, ER 50%, PR 3%, HER-2 equivocal Ki-67 15% nipple area biopsy grade 2 IDC, ER 100% positive, PR negative, HER-2 equivocal, Ki-67 40% T2N1a Stage 1B I discussed with her that given the fact that she has lymph node positive disease, this is clinically high risk disease.  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, potential benefit of treatment at 5 years: 94.6% distant metastasis free interval for patients treated with chemotherapy  Recommendation: 1.Adjuvant chemotherapy with dose dense Adriamycin and Cytoxan x4 followed by Taxol weekly x12 2.followed by radiation 3.Followed by adjuvant antiestrogen therapy ----------------------------------------------------------------------- Current treatment: Taxol  She will continue on her current medications, and she will undergo echocardiogram ASAP to evaluate for any cardiologic cause of the swelling.  I counseled the patient that this is necessary since she received Adriamycin.    She will proceed with the Taxol.  I reviewed risks and benefits in detail.  Hopefully since she is starting a different treatment, the side effects will be much less.    She will return every week for chemotherapy, and every other week for f/u with myself or Dr. Gudena.    

## 2017-09-22 NOTE — Patient Instructions (Addendum)
[facility] Discharge Instructions for Patients Receiving Chemotherapy  Today you received the following chemotherapy agents :  TaxolPaclitaxel injection What is this medicine? PACLITAXEL (PAK li TAX el) is a chemotherapy drug. It targets fast dividing cells, like cancer cells, and causes these cells to die. This medicine is used to treat ovarian cancer, breast cancer, and other cancers. This medicine may be used for other purposes; ask your health care provider or pharmacist if you have questions. COMMON BRAND NAME(S): Onxol, Taxol What should I tell my health care provider before I take this medicine? They need to know if you have any of these conditions: -blood disorders -irregular heartbeat -infection (especially a virus infection such as chickenpox, cold sores, or herpes) -liver disease -previous or ongoing radiation therapy -an unusual or allergic reaction to paclitaxel, alcohol, polyoxyethylated castor oil, other chemotherapy agents, other medicines, foods, dyes, or preservatives -pregnant or trying to get pregnant -breast-feeding How should I use this medicine? This drug is given as an infusion into a vein. It is administered in a hospital or clinic by a specially trained health care professional. Talk to your pediatrician regarding the use of this medicine in children. Special care may be needed. Overdosage: If you think you have taken too much of this medicine contact a poison control center or emergency room at once. NOTE: This medicine is only for you. Do not share this medicine with others. What if I miss a dose? It is important not to miss your dose. Call your doctor or health care professional if you are unable to keep an appointment. What may interact with this medicine? Do not take this medicine with any of the following medications: -disulfiram -metronidazole This medicine may also interact with the following  medications: -cyclosporine -diazepam -ketoconazole -medicines to increase blood counts like filgrastim, pegfilgrastim, sargramostim -other chemotherapy drugs like cisplatin, doxorubicin, epirubicin, etoposide, teniposide, vincristine -quinidine -testosterone -vaccines -verapamil Talk to your doctor or health care professional before taking any of these medicines: -acetaminophen -aspirin -ibuprofen -ketoprofen -naproxen This list may not describe all possible interactions. Give your health care provider a list of all the medicines, herbs, non-prescription drugs, or dietary supplements you use. Also tell them if you smoke, drink alcohol, or use illegal drugs. Some items may interact with your medicine. What should I watch for while using this medicine? Your condition will be monitored carefully while you are receiving this medicine. You will need important blood work done while you are taking this medicine. This medicine can cause serious allergic reactions. To reduce your risk you will need to take other medicine(s) before treatment with this medicine. If you experience allergic reactions like skin rash, itching or hives, swelling of the face, lips, or tongue, tell your doctor or health care professional right away. In some cases, you may be given additional medicines to help with side effects. Follow all directions for their use. This drug may make you feel generally unwell. This is not uncommon, as chemotherapy can affect healthy cells as well as cancer cells. Report any side effects. Continue your course of treatment even though you feel ill unless your doctor tells you to stop. Call your doctor or health care professional for advice if you get a fever, chills or sore throat, or other symptoms of a cold or flu. Do not treat yourself. This drug decreases your body's ability to fight infections. Try to avoid being around people who are sick. This medicine may increase your risk to bruise or  bleed.  Call your doctor or health care professional if you notice any unusual bleeding. Be careful brushing and flossing your teeth or using a toothpick because you may get an infection or bleed more easily. If you have any dental work done, tell your dentist you are receiving this medicine. Avoid taking products that contain aspirin, acetaminophen, ibuprofen, naproxen, or ketoprofen unless instructed by your doctor. These medicines may hide a fever. Do not become pregnant while taking this medicine. Women should inform their doctor if they wish to become pregnant or think they might be pregnant. There is a potential for serious side effects to an unborn child. Talk to your health care professional or pharmacist for more information. Do not breast-feed an infant while taking this medicine. Men are advised not to father a child while receiving this medicine. This product may contain alcohol. Ask your pharmacist or healthcare provider if this medicine contains alcohol. Be sure to tell all healthcare providers you are taking this medicine. Certain medicines, like metronidazole and disulfiram, can cause an unpleasant reaction when taken with alcohol. The reaction includes flushing, headache, nausea, vomiting, sweating, and increased thirst. The reaction can last from 30 minutes to several hours. What side effects may I notice from receiving this medicine? Side effects that you should report to your doctor or health care professional as soon as possible: -allergic reactions like skin rash, itching or hives, swelling of the face, lips, or tongue -low blood counts - This drug may decrease the number of white blood cells, red blood cells and platelets. You may be at increased risk for infections and bleeding. -signs of infection - fever or chills, cough, sore throat, pain or difficulty passing urine -signs of decreased platelets or bleeding - bruising, pinpoint red spots on the skin, black, tarry stools,  nosebleeds -signs of decreased red blood cells - unusually weak or tired, fainting spells, lightheadedness -breathing problems -chest pain -high or low blood pressure -mouth sores -nausea and vomiting -pain, swelling, redness or irritation at the injection site -pain, tingling, numbness in the hands or feet -slow or irregular heartbeat -swelling of the ankle, feet, hands Side effects that usually do not require medical attention (report to your doctor or health care professional if they continue or are bothersome): -bone pain -complete hair loss including hair on your head, underarms, pubic hair, eyebrows, and eyelashes -changes in the color of fingernails -diarrhea -loosening of the fingernails -loss of appetite -muscle or joint pain -red flush to skin -sweating This list may not describe all possible side effects. Call your doctor for medical advice about side effects. You may report side effects to FDA at 1-800-FDA-1088. Where should I keep my medicine? This drug is given in a hospital or clinic and will not be stored at home. NOTE: This sheet is a summary. It may not cover all possible information. If you have questions about this medicine, talk to your doctor, pharmacist, or health care provider.  2018 [employer]/Gold Standard (2014-12-25 19:58:00)   To help prevent nausea and vomiting after your treatment, we encourage you to take your nausea medication as prescribed.   If you develop nausea and vomiting that is not controlled by your nausea medication, call the clinic.   BELOW ARE SYMPTOMS THAT SHOULD BE REPORTED IMMEDIATELY:  *FEVER GREATER THAN 100.5 F  *CHILLS WITH OR WITHOUT FEVER  NAUSEA AND VOMITING THAT IS NOT CONTROLLED WITH YOUR NAUSEA MEDICATION  *UNUSUAL SHORTNESS OF BREATH  *UNUSUAL BRUISING OR BLEEDING  TENDERNESS IN MOUTH AND THROAT  WITH OR WITHOUT PRESENCE OF ULCERS  *URINARY PROBLEMS  *BOWEL PROBLEMS  UNUSUAL RASH Items with * indicate a  potential emergency and should be followed up as soon as possible.  Feel free to call the clinic should you have any questions or concerns. The clinic phone number is [phone].  Please show the [facility] at check-in to the Emergency Department and triage nurse.
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
## 2017-09-24 ENCOUNTER — Ambulatory Visit: Payer: Medicare Other

## 2017-09-29 ENCOUNTER — Inpatient Hospital Stay (HOSPITAL_BASED_OUTPATIENT_CLINIC_OR_DEPARTMENT_OTHER): Payer: Medicare Other | Admitting: Medical

## 2017-09-29 ENCOUNTER — Inpatient Hospital Stay: Payer: Medicare Other

## 2017-09-29 ENCOUNTER — Other Ambulatory Visit: Payer: Self-pay | Admitting: Medical

## 2017-09-29 VITALS — BP 145/63 | HR 79 | Temp 98.4°F | Resp 18

## 2017-09-29 DIAGNOSIS — Z170 Estrogen receptor positive status [ER+]: Principal | ICD-10-CM

## 2017-09-29 DIAGNOSIS — J0100 Acute maxillary sinusitis, unspecified: Secondary | ICD-10-CM | POA: Diagnosis not present

## 2017-09-29 DIAGNOSIS — C50412 Malignant neoplasm of upper-outer quadrant of left female breast: Secondary | ICD-10-CM

## 2017-09-29 DIAGNOSIS — Z95828 Presence of other vascular implants and grafts: Secondary | ICD-10-CM

## 2017-09-29 DIAGNOSIS — Z5111 Encounter for antineoplastic chemotherapy: Secondary | ICD-10-CM | POA: Diagnosis not present

## 2017-09-29 DIAGNOSIS — Z79899 Other long term (current) drug therapy: Secondary | ICD-10-CM

## 2017-09-29 LAB — CMP (CANCER CENTER ONLY)
ALBUMIN: 3.3 g/dL — AB (ref 3.5–5.0)
ALT: 32 U/L (ref 0–44)
AST: 29 U/L (ref 15–41)
Alkaline Phosphatase: 83 U/L (ref 38–126)
Anion gap: 7 (ref 5–15)
BILIRUBIN TOTAL: 0.2 mg/dL — AB (ref 0.3–1.2)
BUN: 15 mg/dL (ref 8–23)
CHLORIDE: 102 mmol/L (ref 98–111)
CO2: 31 mmol/L (ref 22–32)
Calcium: 8.6 mg/dL — ABNORMAL LOW (ref 8.9–10.3)
Creatinine: 0.66 mg/dL (ref 0.44–1.00)
GFR, Est AFR Am: >60 mL/min (ref >60)
Glucose, Bld: 155 mg/dL — ABNORMAL HIGH (ref 70–99)
POTASSIUM: 3.8 mmol/L (ref 3.5–5.1)
Sodium: 140 mmol/L (ref 135–145)
Total Protein: 5.9 g/dL — ABNORMAL LOW (ref 6.5–8.1)

## 2017-09-29 LAB — CBC WITH DIFFERENTIAL (CANCER CENTER ONLY)
Basophils Absolute: 0 10*3/uL (ref 0.0–0.1)
Basophils Relative: 2 %
Eosinophils Absolute: 0 10*3/uL (ref 0.0–0.5)
Eosinophils Relative: 1 %
HEMATOCRIT: 26.2 % — AB (ref 34.8–46.6)
HEMOGLOBIN: 8.9 g/dL — AB (ref 11.6–15.9)
LYMPHS ABS: 0.6 10*3/uL — AB (ref 0.9–3.3)
LYMPHS PCT: 36 %
MCH: 31.6 pg (ref 25.1–34.0)
MCHC: 33.9 g/dL (ref 31.5–36.0)
MCV: 93.1 fL (ref 79.5–101.0)
MONOS PCT: 7 %
Monocytes Absolute: 0.1 10*3/uL (ref 0.1–0.9)
NEUTROS ABS: 0.9 10*3/uL — AB (ref 1.5–6.5)
NEUTROS PCT: 54 %
Platelet Count: 227 10*3/uL (ref 145–400)
RBC: 2.81 MIL/uL — AB (ref 3.70–5.45)
RDW: 16.5 % — ABNORMAL HIGH (ref 11.2–14.5)
WBC: 1.7 10*3/uL — AB (ref 3.9–10.3)

## 2017-09-29 MED ORDER — SODIUM CHLORIDE 0.9% FLUSH
10.0000 mL | INTRAVENOUS | Status: DC | PRN
  Administered 2017-09-29: 10 mL
  Filled 2017-09-29: qty 10

## 2017-09-29 MED ORDER — AZITHROMYCIN 250 MG PO TABS: ORAL_TABLET | ORAL | 0 refills | Status: DC

## 2017-09-29 MED ORDER — HEPARIN SOD (PORK) LOCK FLUSH 100 UNIT/ML IV SOLN
500.0000 [IU] | Freq: Once | INTRAVENOUS | Status: AC | PRN
  Administered 2017-09-29: 500 [IU]
  Filled 2017-09-29: qty 5

## 2017-09-29 NOTE — Progress Notes (Signed)
Patient with ANC of 0.9.  Per Dr. Jana Hakim if patient can come on Thursday (25th), Friday (26th), and Saturday (27th) for Granix injections okay to treat.  If patient is unable to come, hold treatment this week and check levels next week.  Per infusion nurse, patient will agree with holding treatment this week and rechecking levels next week.  Appointment already scheduled for 10/06/2017.

## 2017-09-29 NOTE — Progress Notes (Signed)
Pt ANC 0.9.  Per Dr. Jana Hakim (Dr. Lindi Adie on [facility]) pt has option to receive treatment today plus three additional days of granix, or hold treatment this week and return next week.  Pt made decision to return next week and hold treatment today.  Sandi Mealy, PA saw pt in treatment room due to ongoing cold symptoms.  Z-pack to be sent to pt pharmacy.  Pt in agreement with plan.  VSS today, pt denies fever or other symptoms of infection.  Pt knows to call if symptoms worsen or if other signs of infection develop
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
## 2017-10-04 ENCOUNTER — Ambulatory Visit (HOSPITAL_COMMUNITY)
Admission: RE | Admit: 2017-10-04 | Discharge: 2017-10-04 | Disposition: A | Discharge status: Home / Self Care | Payer: Medicare Other | Source: Ambulatory Visit | Attending: Adult Health | Admitting: Adult Health

## 2017-10-04 ENCOUNTER — Telehealth: Payer: Self-pay

## 2017-10-04 DIAGNOSIS — Z170 Estrogen receptor positive status [ER+]: Secondary | ICD-10-CM | POA: Diagnosis present

## 2017-10-04 DIAGNOSIS — C50412 Malignant neoplasm of upper-outer quadrant of left female breast: Secondary | ICD-10-CM | POA: Diagnosis not present

## 2017-10-04 DIAGNOSIS — R609 Edema, unspecified: Secondary | ICD-10-CM | POA: Diagnosis not present

## 2017-10-04 DIAGNOSIS — J449 Chronic obstructive pulmonary disease, unspecified: Secondary | ICD-10-CM | POA: Diagnosis not present

## 2017-10-04 DIAGNOSIS — R0600 Dyspnea, unspecified: Secondary | ICD-10-CM | POA: Insufficient documentation

## 2017-10-04 DIAGNOSIS — I080 Rheumatic disorders of both mitral and aortic valves: Secondary | ICD-10-CM | POA: Diagnosis not present

## 2017-10-04 NOTE — Telephone Encounter (Signed)
Called pt but realized she is aware of her appt for Wednesday.  No other needs at this time.

## 2017-10-04 NOTE — Progress Notes (Signed)
  Echocardiogram 2D Echocardiogram has been performed.  Breanna [name] 10/04/2017, 11:16 AM

## 2017-10-06 ENCOUNTER — Inpatient Hospital Stay: Payer: Medicare Other

## 2017-10-06 ENCOUNTER — Telehealth: Payer: Self-pay | Admitting: Adult Health

## 2017-10-06 ENCOUNTER — Encounter: Payer: Self-pay | Admitting: Adult Health

## 2017-10-06 ENCOUNTER — Inpatient Hospital Stay (HOSPITAL_BASED_OUTPATIENT_CLINIC_OR_DEPARTMENT_OTHER): Payer: Medicare Other | Admitting: Adult Health

## 2017-10-06 ENCOUNTER — Telehealth: Payer: Self-pay | Admitting: Hematology and Oncology

## 2017-10-06 VITALS — BP 135/61 | HR 73 | Temp 97.5°F | Resp 18 | Ht 65.0 in | Wt 215.3 lb

## 2017-10-06 DIAGNOSIS — Z9012 Acquired absence of left breast and nipple: Secondary | ICD-10-CM | POA: Diagnosis not present

## 2017-10-06 DIAGNOSIS — Z9109 Other allergy status, other than to drugs and biological substances: Secondary | ICD-10-CM

## 2017-10-06 DIAGNOSIS — R609 Edema, unspecified: Secondary | ICD-10-CM

## 2017-10-06 DIAGNOSIS — Z170 Estrogen receptor positive status [ER+]: Principal | ICD-10-CM

## 2017-10-06 DIAGNOSIS — J0100 Acute maxillary sinusitis, unspecified: Secondary | ICD-10-CM

## 2017-10-06 DIAGNOSIS — Z95828 Presence of other vascular implants and grafts: Secondary | ICD-10-CM

## 2017-10-06 DIAGNOSIS — C50412 Malignant neoplasm of upper-outer quadrant of left female breast: Secondary | ICD-10-CM

## 2017-10-06 DIAGNOSIS — Z5111 Encounter for antineoplastic chemotherapy: Secondary | ICD-10-CM | POA: Diagnosis not present

## 2017-10-06 DIAGNOSIS — Z171 Estrogen receptor negative status [ER-]: Secondary | ICD-10-CM | POA: Diagnosis not present

## 2017-10-06 DIAGNOSIS — Z79899 Other long term (current) drug therapy: Secondary | ICD-10-CM

## 2017-10-06 LAB — CMP (CANCER CENTER ONLY)
ALT: 29 U/L (ref 0–44)
ANION GAP: 5 (ref 5–15)
AST: 25 U/L (ref 15–41)
Albumin: 3.5 g/dL (ref 3.5–5.0)
Alkaline Phosphatase: 69 U/L (ref 38–126)
BUN: 9 mg/dL (ref 8–23)
CO2: 33 mmol/L — AB (ref 22–32)
Calcium: 9 mg/dL (ref 8.9–10.3)
Chloride: 103 mmol/L (ref 98–111)
Creatinine: 0.65 mg/dL (ref 0.44–1.00)
Glucose, Bld: 133 mg/dL — ABNORMAL HIGH (ref 70–99)
Potassium: 3.6 mmol/L (ref 3.5–5.1)
SODIUM: 141 mmol/L (ref 135–145)
Total Bilirubin: 0.3 mg/dL (ref 0.3–1.2)
Total Protein: 6.1 g/dL — ABNORMAL LOW (ref 6.5–8.1)

## 2017-10-06 LAB — CBC WITH DIFFERENTIAL (CANCER CENTER ONLY)
BASOS ABS: 0 10*3/uL (ref 0.0–0.1)
Basophils Relative: 1 %
EOS ABS: 0 10*3/uL (ref 0.0–0.5)
Eosinophils Relative: 1 %
HEMATOCRIT: 31.3 % — AB (ref 34.8–46.6)
HEMOGLOBIN: 10 g/dL — AB (ref 11.6–15.9)
Lymphocytes Relative: 37 %
Lymphs Abs: 0.8 10*3/uL — ABNORMAL LOW (ref 0.9–3.3)
MCH: 31.2 pg (ref 25.1–34.0)
MCHC: 31.9 g/dL (ref 31.5–36.0)
MCV: 97.5 fL (ref 79.5–101.0)
MONOS PCT: 28 %
Monocytes Absolute: 0.6 10*3/uL (ref 0.1–0.9)
NEUTROS ABS: 0.7 10*3/uL — AB (ref 1.5–6.5)
NEUTROS PCT: 33 %
Platelet Count: 199 10*3/uL (ref 145–400)
RBC: 3.21 MIL/uL — AB (ref 3.70–5.45)
RDW: 16.3 % — ABNORMAL HIGH (ref 11.2–14.5)
WBC: 2.1 10*3/uL — AB (ref 3.9–10.3)

## 2017-10-06 MED ORDER — SODIUM CHLORIDE 0.9% FLUSH
10.0000 mL | INTRAVENOUS | Status: DC | PRN
  Administered 2017-10-06: 10 mL
  Filled 2017-10-06: qty 10

## 2017-10-06 MED ORDER — FUROSEMIDE 20 MG PO TABS: 20.0000 mg | ORAL_TABLET | Freq: Every day | ORAL | 0 refills | Status: DC

## 2017-10-06 MED ORDER — TBO-FILGRASTIM 480 MCG/0.8ML ~~LOC~~ SOSY
480.0000 ug | PREFILLED_SYRINGE | Freq: Once | SUBCUTANEOUS | Status: AC
  Administered 2017-10-06: 480 ug via SUBCUTANEOUS

## 2017-10-06 MED ORDER — AZELASTINE-FLUTICASONE 137-50 MCG/ACT NA SUSP: 1.0000 | Freq: Every day | NASAL | 0 refills | Status: DC

## 2017-10-06 MED ORDER — TBO-FILGRASTIM 480 MCG/0.8ML ~~LOC~~ SOSY
PREFILLED_SYRINGE | SUBCUTANEOUS | Status: AC
  Filled 2017-10-06: qty 0.8

## 2017-10-06 NOTE — Assessment & Plan Note (Addendum)
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  I reviewed her above issues with Dr. Lindi Adie.  Due to her continued neutropenia, she will receive Granix today, and then Mondays before chemotherapy beginning 8/12.   I also discontinued her HCTZ and flonase and added lasix and dymista.  She is already taking a potassium supplement.   She will return every week for chemotherapy, and every other week for f/u with myself or Dr. Lindi Adie.

## 2017-10-06 NOTE — Patient Instructions (Signed)
Tbo-Filgrastim injection What is this medicine? TBO-FILGRASTIM (T B O fil GRA stim) is a granulocyte colony-stimulating factor that stimulates the growth of neutrophils, a type of white blood cell important in the body's fight against infection. It is used to reduce the incidence of fever and infection in patients with certain types of cancer who are receiving chemotherapy that affects the bone marrow. This medicine may be used for other purposes; ask your health care provider or pharmacist if you have questions. COMMON BRAND NAME(S): Granix What should I tell my health care provider before I take this medicine? They need to know if you have any of these conditions: -bone scan or tests planned -kidney disease -sickle cell anemia -an unusual or allergic reaction to tbo-filgrastim, filgrastim, pegfilgrastim, other medicines, foods, dyes, or preservatives -pregnant or trying to get pregnant -breast-feeding How should I use this medicine? This medicine is for injection under the skin. If you get this medicine at home, you will be taught how to prepare and give this medicine. Refer to the Instructions for Use that come with your medication packaging. Use exactly as directed. Take your medicine at regular intervals. Do not take your medicine more often than directed. It is important that you put your used needles and syringes in a special sharps container. Do not put them in a trash can. If you do not have a sharps container, call your pharmacist or healthcare provider to get one. Talk to your pediatrician regarding the use of this medicine in children. Special care may be needed. Overdosage: If you think you have taken too much of this medicine contact a poison control center or emergency room at once. NOTE: This medicine is only for you. Do not share this medicine with others. What if I miss a dose? It is important not to miss your dose. Call your doctor or health care professional if you miss a  dose. What may interact with this medicine? This medicine may interact with the following medications: -medicines that may cause a release of neutrophils, such as lithium This list may not describe all possible interactions. Give your health care provider a list of all the medicines, herbs, non-prescription drugs, or dietary supplements you use. Also tell them if you smoke, drink alcohol, or use illegal drugs. Some items may interact with your medicine. What should I watch for while using this medicine? You may need blood work done while you are taking this medicine. What side effects may I notice from receiving this medicine? Side effects that you should report to your doctor or health care professional as soon as possible: -allergic reactions like skin rash, itching or hives, swelling of the face, lips, or tongue -blood in the urine -dark urine -dizziness -fast heartbeat -feeling faint -shortness of breath or breathing problems -signs and symptoms of infection like fever or chills; cough; or sore throat -signs and symptoms of kidney injury like trouble passing urine or change in the amount of urine -stomach or side pain, or pain at the shoulder -sweating -swelling of the legs, ankles, or abdomen -tiredness Side effects that usually do not require medical attention (report to your doctor or health care professional if they continue or are bothersome): -bone pain -headache -muscle pain -vomiting This list may not describe all possible side effects. Call your doctor for medical advice about side effects. You may report side effects to FDA at 1-800-FDA-1088. Where should I keep my medicine? Keep out of the reach of children. Store in a refrigerator between   2 and 8 degrees C (36 and 46 degrees F). Keep in carton to protect from light. Throw away this medicine if it is left out of the refrigerator for more than 5 consecutive days. Throw away any unused medicine after the expiration  date. NOTE: This sheet is a summary. It may not cover all possible information. If you have questions about this medicine, talk to your doctor, pharmacist, or health care provider.  2018 Elsevier/Gold Standard (2015-04-15 19:07:04)  

## 2017-10-06 NOTE — Telephone Encounter (Signed)
Printed medical records for patient to pick up, Release ID: [id number]
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
## 2017-10-06 NOTE — Telephone Encounter (Signed)
Gave avs and calendar ° °

## 2017-10-13 ENCOUNTER — Inpatient Hospital Stay: Payer: Medicare Other

## 2017-10-13 ENCOUNTER — Inpatient Hospital Stay: Payer: Medicare Other | Attending: Hematology and Oncology

## 2017-10-13 VITALS — BP 127/55 | HR 62 | Temp 98.3°F | Resp 18 | Wt 216.0 lb

## 2017-10-13 DIAGNOSIS — R609 Edema, unspecified: Secondary | ICD-10-CM | POA: Insufficient documentation

## 2017-10-13 DIAGNOSIS — Z5111 Encounter for antineoplastic chemotherapy: Secondary | ICD-10-CM | POA: Diagnosis not present

## 2017-10-13 DIAGNOSIS — C50412 Malignant neoplasm of upper-outer quadrant of left female breast: Secondary | ICD-10-CM | POA: Diagnosis not present

## 2017-10-13 DIAGNOSIS — Z95828 Presence of other vascular implants and grafts: Secondary | ICD-10-CM

## 2017-10-13 DIAGNOSIS — Z170 Estrogen receptor positive status [ER+]: Secondary | ICD-10-CM | POA: Diagnosis not present

## 2017-10-13 DIAGNOSIS — Z9012 Acquired absence of left breast and nipple: Secondary | ICD-10-CM | POA: Insufficient documentation

## 2017-10-13 DIAGNOSIS — Z79899 Other long term (current) drug therapy: Secondary | ICD-10-CM | POA: Diagnosis not present

## 2017-10-13 LAB — CBC WITH DIFFERENTIAL (CANCER CENTER ONLY)
BASOS PCT: 1 %
Basophils Absolute: 0 10*3/uL (ref 0.0–0.1)
EOS ABS: 0.1 10*3/uL (ref 0.0–0.5)
EOS PCT: 2 %
HCT: 32.8 % — ABNORMAL LOW (ref 34.8–46.6)
HEMOGLOBIN: 10.8 g/dL — AB (ref 11.6–15.9)
Lymphocytes Relative: 18 %
Lymphs Abs: 1 10*3/uL (ref 0.9–3.3)
MCH: 31.6 pg (ref 25.1–34.0)
MCHC: 32.9 g/dL (ref 31.5–36.0)
MCV: 96.1 fL (ref 79.5–101.0)
MONOS PCT: 12 %
Monocytes Absolute: 0.7 10*3/uL (ref 0.1–0.9)
NEUTROS PCT: 67 %
Neutro Abs: 3.9 10*3/uL (ref 1.5–6.5)
PLATELETS: 178 10*3/uL (ref 145–400)
RBC: 3.41 MIL/uL — ABNORMAL LOW (ref 3.70–5.45)
RDW: 18.1 % — ABNORMAL HIGH (ref 11.2–14.5)
WBC: 5.8 10*3/uL (ref 3.9–10.3)

## 2017-10-13 LAB — CMP (CANCER CENTER ONLY)
ALK PHOS: 80 U/L (ref 38–126)
ALT: 27 U/L (ref 0–44)
AST: 28 U/L (ref 15–41)
Albumin: 3.5 g/dL (ref 3.5–5.0)
Anion gap: 6 (ref 5–15)
BUN: 9 mg/dL (ref 8–23)
CALCIUM: 8.7 mg/dL — AB (ref 8.9–10.3)
CO2: 31 mmol/L (ref 22–32)
CREATININE: 0.65 mg/dL (ref 0.44–1.00)
Chloride: 105 mmol/L (ref 98–111)
Glucose, Bld: 134 mg/dL — ABNORMAL HIGH (ref 70–99)
Potassium: 3.7 mmol/L (ref 3.5–5.1)
Sodium: 142 mmol/L (ref 135–145)
Total Bilirubin: 0.3 mg/dL (ref 0.3–1.2)
Total Protein: 6.1 g/dL — ABNORMAL LOW (ref 6.5–8.1)

## 2017-10-13 MED ORDER — SODIUM CHLORIDE 0.9% FLUSH
10.0000 mL | INTRAVENOUS | Status: DC | PRN
  Administered 2017-10-13: 10 mL
  Filled 2017-10-13: qty 10

## 2017-10-13 MED ORDER — FAMOTIDINE IN NACL 20-0.9 MG/50ML-% IV SOLN
20.0000 mg | Freq: Once | INTRAVENOUS | Status: AC
  Administered 2017-10-13: 20 mg via INTRAVENOUS

## 2017-10-13 MED ORDER — DIPHENHYDRAMINE HCL 50 MG/ML IJ SOLN
INTRAMUSCULAR | Status: AC
  Filled 2017-10-13: qty 1

## 2017-10-13 MED ORDER — FAMOTIDINE IN NACL 20-0.9 MG/50ML-% IV SOLN
INTRAVENOUS | Status: AC
  Filled 2017-10-13: qty 50

## 2017-10-13 MED ORDER — SODIUM CHLORIDE 0.9 % IV SOLN
80.0000 mg/m2 | Freq: Once | INTRAVENOUS | Status: AC
  Administered 2017-10-13: 168 mg via INTRAVENOUS
  Filled 2017-10-13: qty 28

## 2017-10-13 MED ORDER — SODIUM CHLORIDE 0.9 % IV SOLN
20.0000 mg | Freq: Once | INTRAVENOUS | Status: AC
  Administered 2017-10-13: 20 mg via INTRAVENOUS
  Filled 2017-10-13: qty 2

## 2017-10-13 MED ORDER — HEPARIN SOD (PORK) LOCK FLUSH 100 UNIT/ML IV SOLN
500.0000 [IU] | Freq: Once | INTRAVENOUS | Status: AC | PRN
Start: 2017-10-13 — End: 2017-10-13
  Administered 2017-10-13: 500 [IU]
  Filled 2017-10-13: qty 5

## 2017-10-13 MED ORDER — DIPHENHYDRAMINE HCL 50 MG/ML IJ SOLN
50.0000 mg | Freq: Once | INTRAMUSCULAR | Status: AC
  Administered 2017-10-13: 50 mg via INTRAVENOUS

## 2017-10-13 MED ORDER — SODIUM CHLORIDE 0.9 % IV SOLN
Freq: Once | INTRAVENOUS | Status: AC
  Administered 2017-10-13: 14:00:00 via INTRAVENOUS
  Filled 2017-10-13: qty 250

## 2017-10-13 NOTE — Progress Notes (Signed)
Infusion nurse called regarding patient's swelling and tightness to BLE.  Per infusion nurse no change in SOB for patient, 1lb weight gain.  Patient requesting increase in lasix.   Per Dr. Lindi Adie, okay to increase Lasix to 40mg  daily.  Infusion nurse notified.

## 2017-10-13 NOTE — Patient Instructions (Signed)
[facility] Discharge Instructions for Patients Receiving Chemotherapy  Today you received the following chemotherapy agents:  Taxol.  To help prevent nausea and vomiting after your treatment, we encourage you to take your nausea medication as directed.   If you develop nausea and vomiting that is not controlled by your nausea medication, call the clinic.   BELOW ARE SYMPTOMS THAT SHOULD BE REPORTED IMMEDIATELY:  *FEVER GREATER THAN 100.5 F  *CHILLS WITH OR WITHOUT FEVER  NAUSEA AND VOMITING THAT IS NOT CONTROLLED WITH YOUR NAUSEA MEDICATION  *UNUSUAL SHORTNESS OF BREATH  *UNUSUAL BRUISING OR BLEEDING  TENDERNESS IN MOUTH AND THROAT WITH OR WITHOUT PRESENCE OF ULCERS  *URINARY PROBLEMS  *BOWEL PROBLEMS  UNUSUAL RASH Items with * indicate a potential emergency and should be followed up as soon as possible.  Feel free to call the clinic should you have any questions or concerns. The clinic phone number is [phone].  Please show the CHEMO ALERT CARD at check-in to the Emergency Department and triage nurse.   

## 2017-10-18 ENCOUNTER — Inpatient Hospital Stay: Payer: Medicare Other

## 2017-10-18 DIAGNOSIS — Z95828 Presence of other vascular implants and grafts: Secondary | ICD-10-CM

## 2017-10-18 DIAGNOSIS — Z5111 Encounter for antineoplastic chemotherapy: Secondary | ICD-10-CM | POA: Diagnosis not present

## 2017-10-18 MED ORDER — TBO-FILGRASTIM 480 MCG/0.8ML ~~LOC~~ SOSY
PREFILLED_SYRINGE | SUBCUTANEOUS | Status: AC
  Filled 2017-10-18: qty 0.8

## 2017-10-18 MED ORDER — TBO-FILGRASTIM 480 MCG/0.8ML ~~LOC~~ SOSY
480.0000 ug | PREFILLED_SYRINGE | Freq: Once | SUBCUTANEOUS | Status: AC
  Administered 2017-10-18: 480 ug via SUBCUTANEOUS

## 2017-10-20 ENCOUNTER — Inpatient Hospital Stay: Payer: Medicare Other

## 2017-10-20 ENCOUNTER — Telehealth: Payer: Self-pay | Admitting: Hematology and Oncology

## 2017-10-20 ENCOUNTER — Encounter: Payer: Self-pay | Admitting: Adult Health

## 2017-10-20 ENCOUNTER — Inpatient Hospital Stay (HOSPITAL_BASED_OUTPATIENT_CLINIC_OR_DEPARTMENT_OTHER): Payer: Medicare Other | Admitting: Hematology and Oncology

## 2017-10-20 DIAGNOSIS — Z170 Estrogen receptor positive status [ER+]: Principal | ICD-10-CM

## 2017-10-20 DIAGNOSIS — R609 Edema, unspecified: Secondary | ICD-10-CM | POA: Diagnosis not present

## 2017-10-20 DIAGNOSIS — Z79899 Other long term (current) drug therapy: Secondary | ICD-10-CM

## 2017-10-20 DIAGNOSIS — Z5111 Encounter for antineoplastic chemotherapy: Secondary | ICD-10-CM | POA: Diagnosis not present

## 2017-10-20 DIAGNOSIS — C50412 Malignant neoplasm of upper-outer quadrant of left female breast: Secondary | ICD-10-CM

## 2017-10-20 DIAGNOSIS — Z95828 Presence of other vascular implants and grafts: Secondary | ICD-10-CM

## 2017-10-20 DIAGNOSIS — Z9012 Acquired absence of left breast and nipple: Secondary | ICD-10-CM

## 2017-10-20 LAB — CBC WITH DIFFERENTIAL (CANCER CENTER ONLY)
BASOS ABS: 0 10*3/uL (ref 0.0–0.1)
Basophils Relative: 1 %
Eosinophils Absolute: 0.1 10*3/uL (ref 0.0–0.5)
Eosinophils Relative: 1 %
HEMATOCRIT: 30 % — AB (ref 34.8–46.6)
HEMOGLOBIN: 9.8 g/dL — AB (ref 11.6–15.9)
LYMPHS PCT: 9 %
Lymphs Abs: 0.7 10*3/uL — ABNORMAL LOW (ref 0.9–3.3)
MCH: 31.4 pg (ref 25.1–34.0)
MCHC: 32.7 g/dL (ref 31.5–36.0)
MCV: 95.9 fL (ref 79.5–101.0)
Monocytes Absolute: 0.2 10*3/uL (ref 0.1–0.9)
Monocytes Relative: 2 %
NEUTROS ABS: 7.6 10*3/uL — AB (ref 1.5–6.5)
Neutrophils Relative %: 87 %
Platelet Count: 126 10*3/uL — ABNORMAL LOW (ref 145–400)
RBC: 3.13 MIL/uL — AB (ref 3.70–5.45)
RDW: 17.6 % — ABNORMAL HIGH (ref 11.2–14.5)
WBC: 8.6 10*3/uL (ref 3.9–10.3)

## 2017-10-20 LAB — CMP (CANCER CENTER ONLY)
ALBUMIN: 3.5 g/dL (ref 3.5–5.0)
ALT: 37 U/L (ref 0–44)
ANION GAP: 10 (ref 5–15)
AST: 30 U/L (ref 15–41)
Alkaline Phosphatase: 87 U/L (ref 38–126)
BUN: 15 mg/dL (ref 8–23)
CO2: 27 mmol/L (ref 22–32)
Calcium: 8.5 mg/dL — ABNORMAL LOW (ref 8.9–10.3)
Chloride: 107 mmol/L (ref 98–111)
Creatinine: 0.66 mg/dL (ref 0.44–1.00)
GFR, Est AFR Am: >60 mL/min (ref >60)
GLUCOSE: 141 mg/dL — AB (ref 70–99)
POTASSIUM: 3.9 mmol/L (ref 3.5–5.1)
Sodium: 144 mmol/L (ref 135–145)
TOTAL PROTEIN: 6.2 g/dL — AB (ref 6.5–8.1)
Total Bilirubin: 0.4 mg/dL (ref 0.3–1.2)

## 2017-10-20 MED ORDER — SODIUM CHLORIDE 0.9 % IV SOLN
80.0000 mg/m2 | Freq: Once | INTRAVENOUS | Status: AC
  Administered 2017-10-20: 168 mg via INTRAVENOUS
  Filled 2017-10-20: qty 28

## 2017-10-20 MED ORDER — SODIUM CHLORIDE 0.9 % IV SOLN
Freq: Once | INTRAVENOUS | Status: AC
  Administered 2017-10-20: 12:00:00 via INTRAVENOUS
  Filled 2017-10-20: qty 250

## 2017-10-20 MED ORDER — DIPHENHYDRAMINE HCL 50 MG/ML IJ SOLN
25.0000 mg | Freq: Once | INTRAMUSCULAR | Status: AC
  Administered 2017-10-20: 25 mg via INTRAVENOUS

## 2017-10-20 MED ORDER — HEPARIN SOD (PORK) LOCK FLUSH 100 UNIT/ML IV SOLN
500.0000 [IU] | Freq: Once | INTRAVENOUS | Status: AC | PRN
  Administered 2017-10-20: 500 [IU]
  Filled 2017-10-20: qty 5

## 2017-10-20 MED ORDER — FAMOTIDINE IN NACL 20-0.9 MG/50ML-% IV SOLN
INTRAVENOUS | Status: AC
Start: 2017-10-20 — End: ?
  Filled 2017-10-20: qty 50

## 2017-10-20 MED ORDER — SODIUM CHLORIDE 0.9% FLUSH
10.0000 mL | INTRAVENOUS | Status: DC | PRN
  Administered 2017-10-20: 10 mL
  Filled 2017-10-20: qty 10

## 2017-10-20 MED ORDER — FAMOTIDINE IN NACL 20-0.9 MG/50ML-% IV SOLN
20.0000 mg | Freq: Once | INTRAVENOUS | Status: AC
  Administered 2017-10-20: 20 mg via INTRAVENOUS

## 2017-10-20 MED ORDER — DIPHENHYDRAMINE HCL 50 MG/ML IJ SOLN
INTRAMUSCULAR | Status: AC
  Filled 2017-10-20: qty 1

## 2017-10-20 MED ORDER — FUROSEMIDE 40 MG PO TABS: 40.0000 mg | ORAL_TABLET | Freq: Every day | ORAL | 3 refills | Status: DC

## 2017-10-20 MED ORDER — SODIUM CHLORIDE 0.9 % IV SOLN
20.0000 mg | Freq: Once | INTRAVENOUS | Status: AC
  Administered 2017-10-20: 20 mg via INTRAVENOUS
  Filled 2017-10-20: qty 2

## 2017-10-20 NOTE — Telephone Encounter (Signed)
Per 8/14 los.  Due to availability and because patient has to travel from [place], moved 8/28 appt to 8/29.  Also, okay per nurse to move Granix injection from Labor Day to the Saturday beforehand.   Gave patient avs and calendar.

## 2017-10-20 NOTE — Patient Instructions (Signed)
[facility] Discharge Instructions for Patients Receiving Chemotherapy  Today you received the following chemotherapy agents:  Taxol.  To help prevent nausea and vomiting after your treatment, we encourage you to take your nausea medication as directed.   If you develop nausea and vomiting that is not controlled by your nausea medication, call the clinic.   BELOW ARE SYMPTOMS THAT SHOULD BE REPORTED IMMEDIATELY:  *FEVER GREATER THAN 100.5 F  *CHILLS WITH OR WITHOUT FEVER  NAUSEA AND VOMITING THAT IS NOT CONTROLLED WITH YOUR NAUSEA MEDICATION  *UNUSUAL SHORTNESS OF BREATH  *UNUSUAL BRUISING OR BLEEDING  TENDERNESS IN MOUTH AND THROAT WITH OR WITHOUT PRESENCE OF ULCERS  *URINARY PROBLEMS  *BOWEL PROBLEMS  UNUSUAL RASH Items with * indicate a potential emergency and should be followed up as soon as possible.  Feel free to call the clinic should you have any questions or concerns. The clinic phone number is [phone].  Please show the CHEMO ALERT CARD at check-in to the Emergency Department and triage nurse.   

## 2017-10-20 NOTE — Assessment & Plan Note (Signed)
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, potential benefit of treatment at 5 years: 94.6% distant metastasis free interval for patients treated with chemotherapy  Recommendation: 1.Adjuvant chemotherapy with dose dense Adriamycin and Cytoxan x4 followed by Taxol weekly x12 2.followed by radiation 3.Followed by adjuvant antiestrogen therapy ----------------------------------------------------------------------- Current treatment: Completed 4 cycles of dose dense edematous and Cytoxan, today cycle 3 Taxol being given with Granix support  Chemo Toxicities: 1.Neutropenia: Receives Granix injections prior to chemo  Monitoring closely for toxicities Return to clinic weekly for Taxol and every other week for follow-up with me

## 2017-10-20 NOTE — Progress Notes (Signed)
Patient Care Team: Sherrilee Gilles, DO as PCP - General (Family Medicine)  DIAGNOSIS:  Encounter Diagnoses  Name Primary?  . Malignant neoplasm of upper-outer quadrant of left breast in female, estrogen receptor positive ([place])   . Swelling     SUMMARY OF ONCOLOGIC HISTORY:   Malignant neoplasm of upper-outer quadrant of left breast in female, estrogen receptor positive ([place])   05/13/2017 Initial Diagnosis    [place] biopsy: Left breast biopsy 2 o'clock position 8 cm from nipple ultrasound-guided biopsy: Grade 2 IDC, ER 50%, PR 3%, HER-2 equivocal Ki-67 15% nipple area biopsy grade 2 IDC, ER 100% positive, PR negative, HER-2 equivocal, Ki-67 40%    05/31/2017 Breast MRI    Left breast UOQ 230 position: 2 x 1.9 x 1.9 cm mass, second mass left breast UOQ 230 position: 1 x 1 x 0.7 cm, 2 more enhancing adjacent 3 and 5 mm nodules slightly superiorly midway between the 2 masses.  No abnormal lymph nodes, right breast normal    06/11/2017 Surgery    Left Mastectomy: 2 foci of IDC 2.2 and 1 cm Grade 2, 1/2 LN Positive; Grade 2 IDC, ER 50%, PR 3%, HER-2 equivocal Ki-67 15% nipple area biopsy grade 2 IDC, ER 100% positive, PR negative, HER-2 equivocal, Ki-67 40% T2N1a Stage 1B    06/25/2017 Miscellaneous    Mammaprint high risk luminal type B    07/23/2017 -  Chemotherapy    Adjuvant chemotherapy with dose dense Adriamycin and Cytoxan x4 followed by Taxol weekly x12      CHIEF COMPLIANT: Taxol cycle 3  INTERVAL HISTORY: Breanna [name] is a 72 year old with above-mentioned history of left breast cancer treated with mastectomy and is currently on adjuvant chemotherapy and today is cycle 3 of Taxol.  She requires Granix injections prior to each treatment.  She appears to be doing much better on this approach.  Her only problem is a hassle of driving all the way down here.  She is complaining of a buzzing sensation of the right breast.  She was also having fluid retention for which we increase  her Lasix to 40 mg daily.  Denies any neuropathy symptoms.  REVIEW OF SYSTEMS:   Constitutional: Denies fevers, chills or abnormal weight loss Eyes: Denies blurriness of vision Ears, nose, mouth, throat, and face: Denies mucositis or sore throat Respiratory: Denies cough, dyspnea or wheezes Cardiovascular: Denies palpitation, chest discomfort Gastrointestinal:  Denies nausea, heartburn or change in bowel habits Skin: Denies abnormal skin rashes Lymphatics: Denies new lymphadenopathy or easy bruising Neurological:Denies numbness, tingling or new weaknesses Behavioral/Psych: Mood is stable, no new changes  Extremities: No lower extremity edema Breast: Left mastectomy, right breast buzzing sensation All other systems were reviewed with the patient and are negative.  I have reviewed the past medical history, past surgical history, social history and family history with the patient and they are unchanged from previous note.  ALLERGIES:  is allergic to atorvastatin; ancef [cefazolin]; vancomycin; lactose; mometasone; nasonex [mometasone furoate]; and neurontin [gabapentin].  MEDICATIONS:  Current Outpatient Medications  Medication Sig Dispense Refill  . acetaminophen (TYLENOL) 500 MG tablet Take 500 mg by mouth every 6 (six) hours as needed for moderate pain or headache.     Marland Kitchen amLODipine (NORVASC) 5 MG tablet Take 5 mg by mouth daily.    . Azelastine-Fluticasone 137-50 MCG/ACT SUSP Place 1 spray into the nose daily. 23 g 0  . azithromycin (ZITHROMAX Z-PAK) 250 MG tablet Take as indicated on the box 6 each  0  . B Complex-C (SUPER B COMPLEX PO) Take 1 tablet by mouth daily.    . carvedilol (COREG) 25 MG tablet Take 25 mg by mouth 2 (two) times daily with a meal.    . cetirizine (ZYRTEC) 10 MG tablet Take 10 mg by mouth daily as needed for allergies.     Marland Kitchen clopidogrel (PLAVIX) 75 MG tablet Take 75 mg by mouth daily.    Marland Kitchen dexamethasone (DECADRON) 4 MG tablet  TAKE 1 TABLET BY MOUTH THE DAY  AFTER CHEMO AND 1 TABLET 2 DAYS AFTER CHEMO WITH FOOD 8 tablet 0  . ferrous sulfate 325 (65 FE) MG tablet Take 325 mg by mouth daily with breakfast.    . furosemide (LASIX) 40 MG tablet Take 1 tablet (40 mg total) by mouth daily. 90 tablet 3  . lansoprazole (PREVACID) 15 MG capsule Take 15 mg by mouth daily.     Marland Kitchen lidocaine-prilocaine (EMLA) cream Apply to affected area once 30 g 3  . lisinopril (PRINIVIL,ZESTRIL) 40 MG tablet Take 40 mg by mouth daily.    Marland Kitchen LORazepam (ATIVAN) 0.5 MG tablet Take 1 tablet (0.5 mg total) by mouth at bedtime as needed (Nausea or vomiting). 30 tablet 0  . ondansetron (ZOFRAN) 8 MG tablet Take 1 tablet (8 mg total) by mouth 2 (two) times daily as needed. Start on the third day after chemotherapy. 30 tablet 1  . Polyethyl Glycol-Propyl Glycol (SYSTANE OP) Place 1 drop into both eyes daily as needed (for dry eyes).     . potassium chloride (K-DUR,KLOR-CON) 10 MEQ tablet Take 10 mEq by mouth daily.     . prochlorperazine (COMPAZINE) 10 MG tablet Take 1 tablet (10 mg total) by mouth every 6 (six) hours as needed (Nausea or vomiting). 30 tablet 1  . traMADol (ULTRAM) 50 MG tablet Take 1 tablet (50 mg total) by mouth every 6 (six) hours as needed (mild pain). 60 tablet 3   No current facility-administered medications for this visit.     PHYSICAL EXAMINATION: ECOG PERFORMANCE STATUS: 1 - Symptomatic but completely ambulatory  Vitals:   10/20/17 1101  BP: 139/69  Pulse: 74  Resp: 17  Temp: 97.8 F (36.6 C)  SpO2: 97%   Filed Weights   10/20/17 1101  Weight: 214 lb 4.8 oz (97.2 kg)    GENERAL:alert, no distress and comfortable SKIN: skin color, texture, turgor are normal, no rashes or significant lesions EYES: normal, Conjunctiva are pink and non-injected, sclera clear OROPHARYNX:no exudate, no erythema and lips, buccal mucosa, and tongue normal  NECK: supple, thyroid normal size, non-tender, without nodularity LYMPH:  no palpable lymphadenopathy in the  cervical, axillary or inguinal LUNGS: clear to auscultation and percussion with normal breathing effort HEART: regular rate & rhythm and no murmurs and no lower extremity edema ABDOMEN:abdomen soft, non-tender and normal bowel sounds MUSCULOSKELETAL:no cyanosis of digits and no clubbing  NEURO: alert & oriented x 3 with fluent speech, no focal motor/sensory deficits EXTREMITIES: No lower extremity edema   LABORATORY DATA:  I have reviewed the data as listed CMP Latest Ref Rng & Units 10/13/2017 10/06/2017 09/29/2017  Glucose 70 - 99 mg/dL 134(H) 133(H) 155(H)  BUN 8 - 23 mg/dL _0 Creatinine 0.44 - 1.00 mg/dL 0.65 0.65 0.66  Sodium 135 - 145 mmol/L 142 141 140  Potassium 3.5 - 5.1 mmol/L 3.7 3.6 3.8  Chloride 98 - 111 mmol/L 105 103 102  CO2 22 - 32 mmol/L 31 33(H) 31  Calcium  8.9 - 10.3 mg/dL 8.7(L) 9.0 8.6(L)  Total Protein 6.5 - 8.1 g/dL 6.1(L) 6.1(L) 5.9(L)  Total Bilirubin 0.3 - 1.2 mg/dL 0.3 0.3 0.2(L)  Alkaline Phos 38 - 126 U/L 80 69 83  AST 15 - 41 U/L _0 ALT 0 - 44 U/L 27 29 32    Lab Results  Component Value Date   WBC 5.8 10/13/2017   HGB 10.8 (L) 10/13/2017   HCT 32.8 (L) 10/13/2017   MCV 96.1 10/13/2017   PLT 178 10/13/2017   NEUTROABS 3.9 10/13/2017    ASSESSMENT & PLAN:  Malignant neoplasm of upper-outer quadrant of left breast in female, estrogen receptor positive ([place]) 05/13/17: [place] biopsy: Left breast biopsy 2 o'clock position 8 cm from nipple ultrasound-guided biopsy: Grade 2 IDC, ER 50%, PR 3%, HER-2 equivocal Ki-67 15% nipple area biopsy grade 2 IDC, ER 100% positive, PR negative, HER-2 equivocal, Ki-67 40%  06/11/17:Left Mastectomy: 2 foci of IDC 2.2 and 1 cm Grade 2, 1/2 LN Positive; Grade 2 IDC, ER 50%, PR 3%, HER-2 equivocal Ki-67 15% nipple area biopsy grade 2 IDC, ER 100% positive, PR negative, HER-2 equivocal, Ki-67 40% T2N1a Stage 1B I discussed with her that given the fact that she has lymph node positive disease, this is  clinically high risk disease.  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, potential benefit of treatment at 5 years: 94.6% distant metastasis free interval for patients treated with chemotherapy  Recommendation: 1.Adjuvant chemotherapy with dose dense Adriamycin and Cytoxan x4 followed by Taxol weekly x12 2.followed by radiation 3.Followed by adjuvant antiestrogen therapy ----------------------------------------------------------------------- Current treatment: Completed 4 cycles of dose dense edematous and Cytoxan, today cycle 3 Taxol being given with Granix support  Chemo Toxicities: 1.Neutropenia: Receives Granix injections prior to chemo  Fluid retention: Lasix prescribed 40 mg daily since 20 mg dosage was not enough She has an appointment to see her memory at [facility] on 11/01/2017.  Buzzing sensation of the right breast: Unclear etiology breast MRIs done few months ago were normal. Monitoring closely for toxicities Return to clinic weekly for Taxol and every other week for follow-up with me  No orders of the defined types were placed in this encounter.  The patient has a good understanding of the overall plan. she agrees with it. she will call with any problems that may develop before the next visit here.   Harriette Ohara, MD 10/20/17

## 2017-10-21 ENCOUNTER — Encounter: Payer: Self-pay | Admitting: Hematology and Oncology

## 2017-10-22 ENCOUNTER — Telehealth: Payer: Self-pay

## 2017-10-22 NOTE — Telephone Encounter (Signed)
Patient sent a mychart message for Dr Lindi Adie, routing the following email from her to him:  "Wednesday you changed my benedryl fro 50 to 25 mg. It was better but not much. I was sleep 75 percent of the time. Would it work if I took 25 or 50 mg in pill form when I get home ? If it isn't as affective. I will continue with the 25 mg. If an oral dose is just as good I would do that. Please advise. Thanks so much"

## 2017-10-25 ENCOUNTER — Telehealth: Payer: Self-pay | Admitting: Hematology and Oncology

## 2017-10-25 ENCOUNTER — Other Ambulatory Visit: Payer: Self-pay | Admitting: Hematology and Oncology

## 2017-10-25 ENCOUNTER — Inpatient Hospital Stay: Payer: Medicare Other

## 2017-10-25 VITALS — BP 118/50 | HR 81 | Temp 98.4°F | Resp 18

## 2017-10-25 DIAGNOSIS — Z5111 Encounter for antineoplastic chemotherapy: Secondary | ICD-10-CM | POA: Diagnosis not present

## 2017-10-25 DIAGNOSIS — Z95828 Presence of other vascular implants and grafts: Secondary | ICD-10-CM

## 2017-10-25 MED ORDER — TBO-FILGRASTIM 480 MCG/0.8ML ~~LOC~~ SOSY
PREFILLED_SYRINGE | SUBCUTANEOUS | Status: AC
  Filled 2017-10-25: qty 0.8

## 2017-10-25 MED ORDER — TBO-FILGRASTIM 480 MCG/0.8ML ~~LOC~~ SOSY
480.0000 ug | PREFILLED_SYRINGE | Freq: Once | SUBCUTANEOUS | Status: AC
  Administered 2017-10-25: 480 ug via SUBCUTANEOUS

## 2017-10-25 NOTE — Patient Instructions (Signed)
Tbo-Filgrastim injection What is this medicine? TBO-FILGRASTIM (T B O fil GRA stim) is a granulocyte colony-stimulating factor that stimulates the growth of neutrophils, a type of white blood cell important in the body's fight against infection. It is used to reduce the incidence of fever and infection in patients with certain types of cancer who are receiving chemotherapy that affects the bone marrow. This medicine may be used for other purposes; ask your health care provider or pharmacist if you have questions. COMMON BRAND NAME(S): Granix What should I tell my health care provider before I take this medicine? They need to know if you have any of these conditions: -bone scan or tests planned -kidney disease -sickle cell anemia -an unusual or allergic reaction to tbo-filgrastim, filgrastim, pegfilgrastim, other medicines, foods, dyes, or preservatives -pregnant or trying to get pregnant -breast-feeding How should I use this medicine? This medicine is for injection under the skin. If you get this medicine at home, you will be taught how to prepare and give this medicine. Refer to the Instructions for Use that come with your medication packaging. Use exactly as directed. Take your medicine at regular intervals. Do not take your medicine more often than directed. It is important that you put your used needles and syringes in a special sharps container. Do not put them in a trash can. If you do not have a sharps container, call your pharmacist or healthcare provider to get one. Talk to your pediatrician regarding the use of this medicine in children. Special care may be needed. Overdosage: If you think you have taken too much of this medicine contact a poison control center or emergency room at once. NOTE: This medicine is only for you. Do not share this medicine with others. What if I miss a dose? It is important not to miss your dose. Call your doctor or health care professional if you miss a  dose. What may interact with this medicine? This medicine may interact with the following medications: -medicines that may cause a release of neutrophils, such as lithium This list may not describe all possible interactions. Give your health care provider a list of all the medicines, herbs, non-prescription drugs, or dietary supplements you use. Also tell them if you smoke, drink alcohol, or use illegal drugs. Some items may interact with your medicine. What should I watch for while using this medicine? You may need blood work done while you are taking this medicine. What side effects may I notice from receiving this medicine? Side effects that you should report to your doctor or health care professional as soon as possible: -allergic reactions like skin rash, itching or hives, swelling of the face, lips, or tongue -blood in the urine -dark urine -dizziness -fast heartbeat -feeling faint -shortness of breath or breathing problems -signs and symptoms of infection like fever or chills; cough; or sore throat -signs and symptoms of kidney injury like trouble passing urine or change in the amount of urine -stomach or side pain, or pain at the shoulder -sweating -swelling of the legs, ankles, or abdomen -tiredness Side effects that usually do not require medical attention (report to your doctor or health care professional if they continue or are bothersome): -bone pain -headache -muscle pain -vomiting This list may not describe all possible side effects. Call your doctor for medical advice about side effects. You may report side effects to FDA at 1-800-FDA-1088. Where should I keep my medicine? Keep out of the reach of children. Store in a refrigerator between   2 and 8 degrees C (36 and 46 degrees F). Keep in carton to protect from light. Throw away this medicine if it is left out of the refrigerator for more than 5 consecutive days. Throw away any unused medicine after the expiration  date. NOTE: This sheet is a summary. It may not cover all possible information. If you have questions about this medicine, talk to your doctor, pharmacist, or health care provider.  2018 Elsevier/Gold Standard (2015-04-15 19:07:04)  

## 2017-10-25 NOTE — Telephone Encounter (Signed)
Patient is unable to tolerate IV Benadryl. I discontinued IV Benadryl and instructed to take 25 mg of oral Benadryl at home.

## 2017-10-27 ENCOUNTER — Inpatient Hospital Stay: Payer: Medicare Other

## 2017-10-27 ENCOUNTER — Telehealth: Payer: Self-pay | Admitting: Hematology and Oncology

## 2017-10-27 ENCOUNTER — Inpatient Hospital Stay (HOSPITAL_BASED_OUTPATIENT_CLINIC_OR_DEPARTMENT_OTHER): Payer: Medicare Other | Admitting: Medical

## 2017-10-27 ENCOUNTER — Other Ambulatory Visit: Payer: Self-pay | Admitting: Hematology and Oncology

## 2017-10-27 VITALS — BP 122/46 | HR 72 | Temp 98.7°F | Resp 18

## 2017-10-27 DIAGNOSIS — Z170 Estrogen receptor positive status [ER+]: Secondary | ICD-10-CM

## 2017-10-27 DIAGNOSIS — Z5111 Encounter for antineoplastic chemotherapy: Secondary | ICD-10-CM | POA: Diagnosis not present

## 2017-10-27 DIAGNOSIS — Z9012 Acquired absence of left breast and nipple: Secondary | ICD-10-CM | POA: Diagnosis not present

## 2017-10-27 DIAGNOSIS — C50412 Malignant neoplasm of upper-outer quadrant of left female breast: Secondary | ICD-10-CM

## 2017-10-27 DIAGNOSIS — M542 Cervicalgia: Secondary | ICD-10-CM

## 2017-10-27 DIAGNOSIS — M62830 Muscle spasm of back: Secondary | ICD-10-CM

## 2017-10-27 DIAGNOSIS — Z79899 Other long term (current) drug therapy: Secondary | ICD-10-CM

## 2017-10-27 DIAGNOSIS — R609 Edema, unspecified: Secondary | ICD-10-CM | POA: Diagnosis not present

## 2017-10-27 LAB — CMP (CANCER CENTER ONLY)
ALK PHOS: 77 U/L (ref 38–126)
ALT: 33 U/L (ref 0–44)
ANION GAP: 5 (ref 5–15)
AST: 24 U/L (ref 15–41)
Albumin: 3.4 g/dL — ABNORMAL LOW (ref 3.5–5.0)
BUN: 15 mg/dL (ref 8–23)
CALCIUM: 8.5 mg/dL — AB (ref 8.9–10.3)
CO2: 31 mmol/L (ref 22–32)
Chloride: 105 mmol/L (ref 98–111)
Creatinine: 0.67 mg/dL (ref 0.44–1.00)
Glucose, Bld: 130 mg/dL — ABNORMAL HIGH (ref 70–99)
Potassium: 3.9 mmol/L (ref 3.5–5.1)
SODIUM: 141 mmol/L (ref 135–145)
TOTAL PROTEIN: 6.1 g/dL — AB (ref 6.5–8.1)
Total Bilirubin: 0.3 mg/dL (ref 0.3–1.2)

## 2017-10-27 LAB — CBC WITH DIFFERENTIAL (CANCER CENTER ONLY)
Basophils Absolute: 0 10*3/uL (ref 0.0–0.1)
Basophils Relative: 1 %
EOS ABS: 0.1 10*3/uL (ref 0.0–0.5)
EOS PCT: 5 %
HCT: 27.4 % — ABNORMAL LOW (ref 34.8–46.6)
HEMOGLOBIN: 9.2 g/dL — AB (ref 11.6–15.9)
LYMPHS ABS: 0.6 10*3/uL — AB (ref 0.9–3.3)
Lymphocytes Relative: 34 %
MCH: 32.2 pg (ref 25.1–34.0)
MCHC: 33.5 g/dL (ref 31.5–36.0)
MCV: 96 fL (ref 79.5–101.0)
MONOS PCT: 7 %
Monocytes Absolute: 0.1 10*3/uL (ref 0.1–0.9)
Neutro Abs: 1 10*3/uL — ABNORMAL LOW (ref 1.5–6.5)
Neutrophils Relative %: 53 %
Platelet Count: 153 10*3/uL (ref 145–400)
RBC: 2.85 MIL/uL — ABNORMAL LOW (ref 3.70–5.45)
RDW: 16.7 % — ABNORMAL HIGH (ref 11.2–14.5)
WBC Count: 1.9 10*3/uL — ABNORMAL LOW (ref 3.9–10.3)

## 2017-10-27 MED ORDER — HEPARIN SOD (PORK) LOCK FLUSH 100 UNIT/ML IV SOLN
500.0000 [IU] | Freq: Once | INTRAVENOUS | Status: AC | PRN
  Administered 2017-10-27: 500 [IU]
  Filled 2017-10-27: qty 5

## 2017-10-27 MED ORDER — SODIUM CHLORIDE 0.9% FLUSH
10.0000 mL | INTRAVENOUS | Status: DC | PRN
  Administered 2017-10-27: 10 mL
  Filled 2017-10-27: qty 10

## 2017-10-27 MED ORDER — SODIUM CHLORIDE 0.9 % IV SOLN
Freq: Once | INTRAVENOUS | Status: AC
  Administered 2017-10-27: 14:00:00 via INTRAVENOUS
  Filled 2017-10-27: qty 250

## 2017-10-27 MED ORDER — FAMOTIDINE IN NACL 20-0.9 MG/50ML-% IV SOLN
INTRAVENOUS | Status: AC
  Filled 2017-10-27: qty 50

## 2017-10-27 MED ORDER — SODIUM CHLORIDE 0.9 % IV SOLN
60.0000 mg/m2 | Freq: Once | INTRAVENOUS | Status: AC
  Administered 2017-10-27: 126 mg via INTRAVENOUS
  Filled 2017-10-27: qty 21

## 2017-10-27 MED ORDER — FAMOTIDINE IN NACL 20-0.9 MG/50ML-% IV SOLN
20.0000 mg | Freq: Once | INTRAVENOUS | Status: AC
  Administered 2017-10-27: 20 mg via INTRAVENOUS

## 2017-10-27 MED ORDER — SODIUM CHLORIDE 0.9 % IV SOLN
20.0000 mg | Freq: Once | INTRAVENOUS | Status: AC
  Administered 2017-10-27: 20 mg via INTRAVENOUS
  Filled 2017-10-27: qty 2

## 2017-10-27 NOTE — Patient Instructions (Addendum)
[facility] Discharge Instructions for Patients Receiving Chemotherapy  Today you received the following chemotherapy agents: paclitaxel (Taxol)  To help prevent nausea and vomiting after your treatment, we encourage you to take your nausea medication as directed.   If you develop nausea and vomiting that is not controlled by your nausea medication, call the clinic.   BELOW ARE SYMPTOMS THAT SHOULD BE REPORTED IMMEDIATELY:  *FEVER GREATER THAN 100.5 F  *CHILLS WITH OR WITHOUT FEVER  NAUSEA AND VOMITING THAT IS NOT CONTROLLED WITH YOUR NAUSEA MEDICATION  *UNUSUAL SHORTNESS OF BREATH  *UNUSUAL BRUISING OR BLEEDING  TENDERNESS IN MOUTH AND THROAT WITH OR WITHOUT PRESENCE OF ULCERS  *URINARY PROBLEMS  *BOWEL PROBLEMS  UNUSUAL RASH Items with * indicate a potential emergency and should be followed up as soon as possible.  Feel free to call the clinic should you have any questions or concerns. The clinic phone number is [phone].  Please show the [place] at check-in to the Emergency Department and triage nurse.   Neutropenia Neutropenia is a condition that occurs when you have a lower-than-normal level of a type of white blood cell (neutrophil) in your body. Neutrophils are made in the spongy center of large bones (bone marrow) and they fight infections. Neutrophils are your body's main defense against bacterial and fungal infections. The fewer neutrophils you have and the longer your body remains without them, the greater your risk of getting a severe infection. What are the causes? This condition can occur if your body uses up or destroys neutrophils faster than your bone marrow can make them. This problem may happen because of:  Bacterial or fungal infection.  Allergic disorders.  Reactions to some medicines.  Autoimmune disease.  An enlarged spleen.  This condition can also occur if your bone marrow does not produce enough neutrophils.  This problem may be caused by:  Cancer.  Cancer treatments, such as radiation or chemotherapy.  Viral infections.  Medicines, such as phenytoin.  Vitamin B12 deficiency.  Diseases of the bone marrow.  Environmental toxins, such as insecticides.  What are the signs or symptoms? This condition does not usually cause symptoms. If symptoms are present, they are usually caused by an underlying infection. Symptoms of an infection may include:  Fever.  Chills.  Swollen glands.  Oral or anal ulcers.  Cough and shortness of breath.  Rash.  Skin infection.  Fatigue.  How is this diagnosed? Your health care provider may suspect neutropenia if you have:  A condition that may cause neutropenia.  Symptoms of infection, especially fever.  Frequent and unusual infections.  You will have a medical history and physical exam. Tests will also be done, such as:  A complete blood count (CBC).  A procedure to collect a sample of bone marrow for examination (bone marrow biopsy).  A chest X-ray.  A urine culture.  A blood culture.  How is this treated? Treatment depends on the underlying cause and severity of your condition. Mild neutropenia may not require treatment. Treatment may include medicines, such as:  Antibiotic medicine given through an IV tube.  Antiviral medicines.  Antifungal medicines.  A medicine to increase neutrophil production (colony-stimulating factor). You may get this drug through an IV tube or by injection.  Steroids given through an IV tube.  If an underlying condition is causing neutropenia, you may need treatment for that condition. If medicines you are taking are causing neutropenia, your health care provider may have you stop taking  those medicines. Follow these instructions at home: Medicines  Take over-the-counter and prescription medicines only as told by your health care provider.  Get a seasonal flu shot (influenza  vaccine). Lifestyle  Do not eat unpasteurized foods.Do not eat unwashed raw fruits or vegetables.  Avoid exposure to groups of people or children.  Avoid being around people who are sick.  Avoid being around dirt or dust, such as in construction areas or gardens.  Do not provide direct care for pets. Avoid animal droppings. Do not clean litter boxes and bird cages. Hygiene   Bathe daily.  Clean the area between the genitals and the anus (perineal area) after you urinate or have a bowel movement. If you are female, wipe from front to back.  Brush your teeth with a soft toothbrush before and after meals.  Do not use a razor that has a blade. Use an electric razor to remove hair.  Wash your hands often. Make sure others who come in contact with you also wash their hands. If soap and water are not available, use hand sanitizer. General instructions  Do not have sex unless your health care provider has approved.  Take actions to avoid cuts and burns. For example: ? Be cautious when you use knives. Always cut away from yourself. ? Keep knives in protective sheaths or guards when not in use. ? Use oven mitts when you cook with a hot stove, oven, or grill. ? Stand a safe distance away from open fires.  Avoid people who received a vaccine in the past 30 days if that vaccine contained a live version of the germ (live vaccine). You should not get a live vaccine. Common live vaccines are varicella, measles, mumps, and rubella.  Do not share food utensils.  Do not use tampons, enemas, or rectal suppositories unless your health care provider has approved.  Keep all appointments as told by your health care provider. This is important. Contact a health care provider if:  You have a fever.  You have chills or you start to shake.  You have: ? A sore throat. ? A warm, red, or tender area on your skin. ? A cough. ? Frequent or painful urination. ? Vaginal discharge or itching.  You  develop: ? Sores in your mouth or anus. ? Swollen lymph nodes. ? Red streaks on the skin. ? A rash.  You feel: ? Nauseous or you vomit. ? Very fatigued. ? Short of breath. This information is not intended to replace advice given to you by your health care provider. Make sure you discuss any questions you have with your health care provider. Document Released: 08/15/2001 Document Revised: 08/01/2015 Document Reviewed: 09/05/2014 Elsevier Interactive Patient Education  [employer].

## 2017-10-27 NOTE — Progress Notes (Signed)
Per Dr. Lindi Adie, ok to treat with neutrophil count of 1.0. Dr. Lindi Adie to reduce chemo dose.  Pt with complaints of dizziness/weakness today.  Also complained of upper back pain. Sandi Mealy, PA was asked to see pt about these symptoms.

## 2017-10-27 NOTE — Telephone Encounter (Signed)
Spoke to pt regarding upcoming appts per 8/21 sch message. Pt will not be able to come in for an appt 8/26.
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
## 2017-10-30 ENCOUNTER — Inpatient Hospital Stay: Payer: Medicare Other

## 2017-10-30 VITALS — BP 100/73 | HR 84 | Temp 97.7°F | Resp 18

## 2017-10-30 DIAGNOSIS — Z95828 Presence of other vascular implants and grafts: Secondary | ICD-10-CM

## 2017-10-30 DIAGNOSIS — Z5111 Encounter for antineoplastic chemotherapy: Secondary | ICD-10-CM | POA: Diagnosis not present

## 2017-10-30 MED ORDER — TBO-FILGRASTIM 480 MCG/0.8ML ~~LOC~~ SOSY
PREFILLED_SYRINGE | SUBCUTANEOUS | Status: AC
  Filled 2017-10-30: qty 0.8

## 2017-10-30 MED ORDER — TBO-FILGRASTIM 480 MCG/0.8ML ~~LOC~~ SOSY
480.0000 ug | PREFILLED_SYRINGE | Freq: Once | SUBCUTANEOUS | Status: AC
  Administered 2017-10-30: 480 ug via SUBCUTANEOUS

## 2017-10-30 NOTE — Patient Instructions (Signed)
Tbo-Filgrastim injection What is this medicine? TBO-FILGRASTIM (T B O fil GRA stim) is a granulocyte colony-stimulating factor that stimulates the growth of neutrophils, a type of white blood cell important in the body's fight against infection. It is used to reduce the incidence of fever and infection in patients with certain types of cancer who are receiving chemotherapy that affects the bone marrow. This medicine may be used for other purposes; ask your health care provider or pharmacist if you have questions. COMMON BRAND NAME(S): Granix What should I tell my health care provider before I take this medicine? They need to know if you have any of these conditions: -bone scan or tests planned -kidney disease -sickle cell anemia -an unusual or allergic reaction to tbo-filgrastim, filgrastim, pegfilgrastim, other medicines, foods, dyes, or preservatives -pregnant or trying to get pregnant -breast-feeding How should I use this medicine? This medicine is for injection under the skin. If you get this medicine at home, you will be taught how to prepare and give this medicine. Refer to the Instructions for Use that come with your medication packaging. Use exactly as directed. Take your medicine at regular intervals. Do not take your medicine more often than directed. It is important that you put your used needles and syringes in a special sharps container. Do not put them in a trash can. If you do not have a sharps container, call your pharmacist or healthcare provider to get one. Talk to your pediatrician regarding the use of this medicine in children. Special care may be needed. Overdosage: If you think you have taken too much of this medicine contact a poison control center or emergency room at once. NOTE: This medicine is only for you. Do not share this medicine with others. What if I miss a dose? It is important not to miss your dose. Call your doctor or health care professional if you miss a  dose. What may interact with this medicine? This medicine may interact with the following medications: -medicines that may cause a release of neutrophils, such as lithium This list may not describe all possible interactions. Give your health care provider a list of all the medicines, herbs, non-prescription drugs, or dietary supplements you use. Also tell them if you smoke, drink alcohol, or use illegal drugs. Some items may interact with your medicine. What should I watch for while using this medicine? You may need blood work done while you are taking this medicine. What side effects may I notice from receiving this medicine? Side effects that you should report to your doctor or health care professional as soon as possible: -allergic reactions like skin rash, itching or hives, swelling of the face, lips, or tongue -blood in the urine -dark urine -dizziness -fast heartbeat -feeling faint -shortness of breath or breathing problems -signs and symptoms of infection like fever or chills; cough; or sore throat -signs and symptoms of kidney injury like trouble passing urine or change in the amount of urine -stomach or side pain, or pain at the shoulder -sweating -swelling of the legs, ankles, or abdomen -tiredness Side effects that usually do not require medical attention (report to your doctor or health care professional if they continue or are bothersome): -bone pain -headache -muscle pain -vomiting This list may not describe all possible side effects. Call your doctor for medical advice about side effects. You may report side effects to FDA at 1-800-FDA-1088. Where should I keep my medicine? Keep out of the reach of children. Store in a refrigerator between   2 and 8 degrees C (36 and 46 degrees F). Keep in carton to protect from light. Throw away this medicine if it is left out of the refrigerator for more than 5 consecutive days. Throw away any unused medicine after the expiration  date. NOTE: This sheet is a summary. It may not cover all possible information. If you have questions about this medicine, talk to your doctor, pharmacist, or health care provider.  2018 Elsevier/Gold Standard (2015-04-15 19:07:04)  

## 2017-11-01 ENCOUNTER — Ambulatory Visit: Payer: Medicare Other

## 2017-11-02 ENCOUNTER — Inpatient Hospital Stay: Payer: Medicare Other

## 2017-11-02 VITALS — BP 142/50 | HR 71 | Temp 98.3°F | Resp 18

## 2017-11-02 DIAGNOSIS — C50412 Malignant neoplasm of upper-outer quadrant of left female breast: Secondary | ICD-10-CM

## 2017-11-02 DIAGNOSIS — Z170 Estrogen receptor positive status [ER+]: Principal | ICD-10-CM

## 2017-11-02 DIAGNOSIS — Z5111 Encounter for antineoplastic chemotherapy: Secondary | ICD-10-CM | POA: Diagnosis not present

## 2017-11-02 MED ORDER — TBO-FILGRASTIM 480 MCG/0.8ML ~~LOC~~ SOSY
PREFILLED_SYRINGE | SUBCUTANEOUS | Status: AC
  Filled 2017-11-02: qty 0.8

## 2017-11-02 MED ORDER — TBO-FILGRASTIM 480 MCG/0.8ML ~~LOC~~ SOSY
480.0000 ug | PREFILLED_SYRINGE | Freq: Once | SUBCUTANEOUS | Status: AC
  Administered 2017-11-02: 480 ug via SUBCUTANEOUS

## 2017-11-04 ENCOUNTER — Inpatient Hospital Stay: Payer: Medicare Other

## 2017-11-04 ENCOUNTER — Other Ambulatory Visit: Payer: Self-pay | Admitting: Hematology and Oncology

## 2017-11-04 ENCOUNTER — Inpatient Hospital Stay (HOSPITAL_BASED_OUTPATIENT_CLINIC_OR_DEPARTMENT_OTHER): Payer: Medicare Other | Admitting: Hematology and Oncology

## 2017-11-04 ENCOUNTER — Telehealth: Payer: Self-pay | Admitting: Hematology and Oncology

## 2017-11-04 DIAGNOSIS — R609 Edema, unspecified: Secondary | ICD-10-CM | POA: Diagnosis not present

## 2017-11-04 DIAGNOSIS — Z170 Estrogen receptor positive status [ER+]: Secondary | ICD-10-CM | POA: Diagnosis not present

## 2017-11-04 DIAGNOSIS — Z95828 Presence of other vascular implants and grafts: Secondary | ICD-10-CM

## 2017-11-04 DIAGNOSIS — Z5111 Encounter for antineoplastic chemotherapy: Secondary | ICD-10-CM | POA: Diagnosis not present

## 2017-11-04 DIAGNOSIS — C50412 Malignant neoplasm of upper-outer quadrant of left female breast: Secondary | ICD-10-CM

## 2017-11-04 DIAGNOSIS — Z79899 Other long term (current) drug therapy: Secondary | ICD-10-CM

## 2017-11-04 DIAGNOSIS — Z9012 Acquired absence of left breast and nipple: Secondary | ICD-10-CM | POA: Diagnosis not present

## 2017-11-04 LAB — CMP (CANCER CENTER ONLY)
ALBUMIN: 3.3 g/dL — AB (ref 3.5–5.0)
ALK PHOS: 90 U/L (ref 38–126)
ALT: 29 U/L (ref 0–44)
ANION GAP: 7 (ref 5–15)
AST: 30 U/L (ref 15–41)
BUN: 12 mg/dL (ref 8–23)
CO2: 32 mmol/L (ref 22–32)
CREATININE: 0.71 mg/dL (ref 0.44–1.00)
Calcium: 9 mg/dL (ref 8.9–10.3)
Chloride: 103 mmol/L (ref 98–111)
GFR, Est AFR Am: >60 mL/min (ref >60)
GFR, Estimated: >60 mL/min (ref >60)
GLUCOSE: 133 mg/dL — AB (ref 70–99)
Potassium: 3.7 mmol/L (ref 3.5–5.1)
SODIUM: 142 mmol/L (ref 135–145)
Total Bilirubin: 0.2 mg/dL — ABNORMAL LOW (ref 0.3–1.2)
Total Protein: 5.9 g/dL — ABNORMAL LOW (ref 6.5–8.1)

## 2017-11-04 LAB — CBC WITH DIFFERENTIAL (CANCER CENTER ONLY)
Basophils Absolute: 0 10*3/uL (ref 0.0–0.1)
Basophils Relative: 1 %
EOS PCT: 1 %
Eosinophils Absolute: 0 10*3/uL (ref 0.0–0.5)
HEMATOCRIT: 29.5 % — AB (ref 34.8–46.6)
Hemoglobin: 9.8 g/dL — ABNORMAL LOW (ref 11.6–15.9)
Lymphocytes Relative: 14 %
Lymphs Abs: 1.1 10*3/uL (ref 0.9–3.3)
MCH: 32.3 pg (ref 25.1–34.0)
MCHC: 33.1 g/dL (ref 31.5–36.0)
MCV: 97.7 fL (ref 79.5–101.0)
MONO ABS: 0.8 10*3/uL (ref 0.1–0.9)
MONOS PCT: 10 %
NEUTROS ABS: 5.8 10*3/uL (ref 1.5–6.5)
Neutrophils Relative %: 74 %
Platelet Count: 252 10*3/uL (ref 145–400)
RBC: 3.02 MIL/uL — ABNORMAL LOW (ref 3.70–5.45)
RDW: 17.2 % — AB (ref 11.2–14.5)
WBC Count: 7.8 10*3/uL (ref 3.9–10.3)

## 2017-11-04 MED ORDER — HEPARIN SOD (PORK) LOCK FLUSH 100 UNIT/ML IV SOLN
500.0000 [IU] | Freq: Once | INTRAVENOUS | Status: AC | PRN
  Administered 2017-11-04: 500 [IU]
  Filled 2017-11-04: qty 5

## 2017-11-04 MED ORDER — SODIUM CHLORIDE 0.9% FLUSH
10.0000 mL | INTRAVENOUS | Status: DC | PRN
  Administered 2017-11-04: 10 mL
  Filled 2017-11-04: qty 10

## 2017-11-04 MED ORDER — SODIUM CHLORIDE 0.9 % IV SOLN
20.0000 mg | Freq: Once | INTRAVENOUS | Status: AC
  Administered 2017-11-04: 20 mg via INTRAVENOUS
  Filled 2017-11-04: qty 2

## 2017-11-04 MED ORDER — SODIUM CHLORIDE 0.9 % IV SOLN
Freq: Once | INTRAVENOUS | Status: AC
  Administered 2017-11-04: 14:00:00 via INTRAVENOUS
  Filled 2017-11-04: qty 250

## 2017-11-04 MED ORDER — FAMOTIDINE IN NACL 20-0.9 MG/50ML-% IV SOLN
INTRAVENOUS | Status: AC
  Filled 2017-11-04: qty 50

## 2017-11-04 MED ORDER — FAMOTIDINE IN NACL 20-0.9 MG/50ML-% IV SOLN
20.0000 mg | Freq: Once | INTRAVENOUS | Status: AC
  Administered 2017-11-04: 20 mg via INTRAVENOUS

## 2017-11-04 MED ORDER — SODIUM CHLORIDE 0.9 % IV SOLN
50.0000 mg/m2 | Freq: Once | INTRAVENOUS | Status: AC
  Administered 2017-11-04: 108 mg via INTRAVENOUS
  Filled 2017-11-04: qty 18

## 2017-11-04 NOTE — Telephone Encounter (Signed)
Per 8/29 los, no new orders or referrals.

## 2017-11-04 NOTE — Patient Instructions (Addendum)
[facility] Discharge Instructions for Patients Receiving Chemotherapy  Today you received the following chemotherapy agents paclitaxel (Taxol) To help prevent nausea and vomiting after your treatment, we encourage you to take your nausea medication as directed   If you develop nausea and vomiting that is not controlled by your nausea medication, call the clinic.   BELOW ARE SYMPTOMS THAT SHOULD BE REPORTED IMMEDIATELY:  *FEVER GREATER THAN 100.5 F  *CHILLS WITH OR WITHOUT FEVER  NAUSEA AND VOMITING THAT IS NOT CONTROLLED WITH YOUR NAUSEA MEDICATION  *UNUSUAL SHORTNESS OF BREATH  *UNUSUAL BRUISING OR BLEEDING  TENDERNESS IN MOUTH AND THROAT WITH OR WITHOUT PRESENCE OF ULCERS  *URINARY PROBLEMS  *BOWEL PROBLEMS  UNUSUAL RASH Items with * indicate a potential emergency and should be followed up as soon as possible.  Feel free to call the clinic should you have any questions or concerns. The clinic phone number is [phone].  Please show the CHEMO ALERT CARD at check-in to the Emergency Department and triage nurse.   

## 2017-11-04 NOTE — Progress Notes (Signed)
 Patient Care Team: McGee, Rachel, DO as PCP - General (Family Medicine)  DIAGNOSIS:  Encounter Diagnosis  Name Primary?  . Malignant neoplasm of upper-outer quadrant of left breast in female, estrogen receptor positive (HCC)     SUMMARY OF ONCOLOGIC HISTORY:   Malignant neoplasm of upper-outer quadrant of left breast in female, estrogen receptor positive (HCC)   05/13/2017 Initial Diagnosis    [place] biopsy: Left breast biopsy 2 o'clock position 8 cm from nipple ultrasound-guided biopsy: Grade 2 IDC, ER 50%, PR 3%, HER-2 equivocal Ki-67 15% nipple area biopsy grade 2 IDC, ER 100% positive, PR negative, HER-2 equivocal, Ki-67 40%    05/31/2017 Breast MRI    Left breast UOQ 230 position: 2 x 1.9 x 1.9 cm mass, second mass left breast UOQ 230 position: 1 x 1 x 0.7 cm, 2 more enhancing adjacent 3 and 5 mm nodules slightly superiorly midway between the 2 masses.  No abnormal lymph nodes, right breast normal    06/11/2017 Surgery    Left Mastectomy: 2 foci of IDC 2.2 and 1 cm Grade 2, 1/2 LN Positive; Grade 2 IDC, ER 50%, PR 3%, HER-2 equivocal Ki-67 15% nipple area biopsy grade 2 IDC, ER 100% positive, PR negative, HER-2 equivocal, Ki-67 40% T2N1a Stage 1B    06/25/2017 Miscellaneous    Mammaprint high risk luminal type B    07/23/2017 -  Chemotherapy    Adjuvant chemotherapy with dose dense Adriamycin and Cytoxan x4 followed by Taxol weekly x12      CHIEF COMPLIANT: Cycle 6 Taxol  INTERVAL HISTORY: Breanna [name] is a 72-year-old with above-mentioned history of left breast cancer treated with mastectomy and is now on adjuvant chemotherapy today cycle 6 of Taxol.  She gets a Granix injection and she developed a lot of bone pain.  She is feeling extremely wiped out and fatigued.  She went to [facility] for pulmonary visit.  They performed a CT of the chest that showed tree-in-bud appearance which is raising concern for infection.  She continues to require oxygen  REVIEW OF  SYSTEMS:   Constitutional: Denies fevers, chills or abnormal weight loss Eyes: Denies blurriness of vision Ears, nose, mouth, throat, and face: Denies mucositis or sore throat Respiratory: Chronic shortness of breath uses oxygen Cardiovascular: Denies palpitation, chest discomfort Gastrointestinal:  Denies nausea, heartburn or change in bowel habits Skin: Denies abnormal skin rashes Lymphatics: Denies new lymphadenopathy or easy bruising Neurological:Denies numbness, tingling or new weaknesses Behavioral/Psych: Mood is stable, no new changes  Extremities: No lower extremity edema  All other systems were reviewed with the patient and are negative.  I have reviewed the past medical history, past surgical history, social history and family history with the patient and they are unchanged from previous note.  ALLERGIES:  is allergic to atorvastatin; ancef [cefazolin]; vancomycin; lactose; mometasone; nasonex [mometasone furoate]; and neurontin [gabapentin].  MEDICATIONS:  Current Outpatient Medications  Medication Sig Dispense Refill  . acetaminophen (TYLENOL) 500 MG tablet Take 500 mg by mouth every 6 (six) hours as needed for moderate pain or headache.     . amLODipine (NORVASC) 5 MG tablet Take 5 mg by mouth daily.    . Azelastine-Fluticasone 137-50 MCG/ACT SUSP Place 1 spray into the nose daily. 23 g 0  . azithromycin (ZITHROMAX Z-PAK) 250 MG tablet Take as indicated on the box 6 each 0  . B Complex-C (SUPER B COMPLEX PO) Take 1 tablet by mouth daily.    . carvedilol (COREG) 25 MG   tablet Take 25 mg by mouth 2 (two) times daily with a meal.    . cetirizine (ZYRTEC) 10 MG tablet Take 10 mg by mouth daily as needed for allergies.     Marland Kitchen clopidogrel (PLAVIX) 75 MG tablet Take 75 mg by mouth daily.    Marland Kitchen dexamethasone (DECADRON) 4 MG tablet  TAKE 1 TABLET BY MOUTH THE DAY AFTER CHEMO AND 1 TABLET 2 DAYS AFTER CHEMO WITH FOOD 8 tablet 0  . ferrous sulfate 325 (65 FE) MG tablet Take 325 mg by  mouth daily with breakfast.    . furosemide (LASIX) 40 MG tablet Take 1 tablet (40 mg total) by mouth daily. 90 tablet 3  . lansoprazole (PREVACID) 15 MG capsule Take 15 mg by mouth daily.     Marland Kitchen lidocaine-prilocaine (EMLA) cream Apply to affected area once 30 g 3  . lisinopril (PRINIVIL,ZESTRIL) 40 MG tablet Take 40 mg by mouth daily.    Marland Kitchen LORazepam (ATIVAN) 0.5 MG tablet Take 1 tablet (0.5 mg total) by mouth at bedtime as needed (Nausea or vomiting). 30 tablet 0  . ondansetron (ZOFRAN) 8 MG tablet Take 1 tablet (8 mg total) by mouth 2 (two) times daily as needed. Start on the third day after chemotherapy. 30 tablet 1  . Polyethyl Glycol-Propyl Glycol (SYSTANE OP) Place 1 drop into both eyes daily as needed (for dry eyes).     . potassium chloride (K-DUR,KLOR-CON) 10 MEQ tablet Take 10 mEq by mouth daily.     . prochlorperazine (COMPAZINE) 10 MG tablet Take 1 tablet (10 mg total) by mouth every 6 (six) hours as needed (Nausea or vomiting). 30 tablet 1  . traMADol (ULTRAM) 50 MG tablet Take 1 tablet (50 mg total) by mouth every 6 (six) hours as needed (mild pain). 60 tablet 3   No current facility-administered medications for this visit.     PHYSICAL EXAMINATION: ECOG PERFORMANCE STATUS: 1 - Symptomatic but completely ambulatory  Vitals:   11/04/17 1112  BP: (!) 125/46  Pulse: 78  Resp: 17  Temp: 98 F (36.7 C)  SpO2: 93%   Filed Weights   11/04/17 1112  Weight: 214 lb 14.4 oz (97.5 kg)    GENERAL:alert, no distress and comfortable SKIN: skin color, texture, turgor are normal, no rashes or significant lesions EYES: normal, Conjunctiva are pink and non-injected, sclera clear OROPHARYNX:no exudate, no erythema and lips, buccal mucosa, and tongue normal  NECK: supple, thyroid normal size, non-tender, without nodularity LYMPH:  no palpable lymphadenopathy in the cervical, axillary or inguinal LUNGS: Diminished breath sounds at bases HEART: regular rate & rhythm and no murmurs and no  lower extremity edema ABDOMEN:abdomen soft, non-tender and normal bowel sounds MUSCULOSKELETAL:no cyanosis of digits and no clubbing  NEURO: alert & oriented x 3 with fluent speech, no focal motor/sensory deficits EXTREMITIES: No lower extremity edema   LABORATORY DATA:  I have reviewed the data as listed CMP Latest Ref Rng & Units 11/04/2017 10/27/2017 10/20/2017  Glucose 70 - 99 mg/dL 133(H) 130(H) 141(H)  BUN 8 - 23 mg/dL _0 Creatinine 0.44 - 1.00 mg/dL 0.71 0.67 0.66  Sodium 135 - 145 mmol/L 142 141 144  Potassium 3.5 - 5.1 mmol/L 3.7 3.9 3.9  Chloride 98 - 111 mmol/L 103 105 107  CO2 22 - 32 mmol/L 32 31 27  Calcium 8.9 - 10.3 mg/dL 9.0 8.5(L) 8.5(L)  Total Protein 6.5 - 8.1 g/dL 5.9(L) 6.1(L) 6.2(L)  Total Bilirubin 0.3 - 1.2 mg/dL 0.2(L) 0.3  0.4  Alkaline Phos 38 - 126 U/L 90 77 87  AST 15 - 41 U/L 30 24 30  ALT 0 - 44 U/L 29 33 37    Lab Results  Component Value Date   WBC 7.8 11/04/2017   HGB 9.8 (L) 11/04/2017   HCT 29.5 (L) 11/04/2017   MCV 97.7 11/04/2017   PLT 252 11/04/2017   NEUTROABS 5.8 11/04/2017    ASSESSMENT & PLAN:  Malignant neoplasm of upper-outer quadrant of left breast in female, estrogen receptor positive (HCC) 05/13/17: [place] biopsy: Left breast biopsy 2 o'clock position 8 cm from nipple ultrasound-guided biopsy: Grade 2 IDC, ER 50%, PR 3%, HER-2 equivocal Ki-67 15% nipple area biopsy grade 2 IDC, ER 100% positive, PR negative, HER-2 equivocal, Ki-67 40%  06/11/17:Left Mastectomy: 2 foci of IDC 2.2 and 1 cm Grade 2, 1/2 LN Positive; Grade 2 IDC, ER 50%, PR 3%, HER-2 equivocal Ki-67 15% nipple area biopsy grade 2 IDC, ER 100% positive, PR negative, HER-2 equivocal, Ki-67 40% T2N1a Stage 1B I discussed with her that given the fact that she has lymph node positive disease, this is clinically high risk disease.  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, potential benefit of treatment at 5 years: 94.6% distant metastasis free interval for  patients treated with chemotherapy  Recommendation: 1.Adjuvant chemotherapy with dose dense Adriamycin and Cytoxan x4 followed by Taxol weekly x12 2.followed by radiation 3.Followed by adjuvant antiestrogen therapy ----------------------------------------------------------------------- Current treatment: Completed 4 cycles of dose dense edematous and Cytoxan, today cycle 6 Taxol being given with Granix support  Chemo Toxicities: 1.Neutropenia: Receives Granix injections prior to chemo I reduce the dosage of chemotherapy and we will cut down the Granix injections to only once prior to each chemo. 2. severe fatigue due to cumulative effect of chemotherapy.  Fluid retention: Lasix prescribed 40 mg daily  Monitoring closely for toxicities Return to clinic weekly for Taxol and every other week for follow-up with me  No orders of the defined types were placed in this encounter.  The patient has a good understanding of the overall plan. she agrees with it. she will call with any problems that may develop before the next visit here.   Viinay K Gudena, MD 11/04/17    

## 2017-11-04 NOTE — Assessment & Plan Note (Signed)
05/13/17: [place] biopsy: Left breast biopsy 2 o'clock position 8 cm from nipple ultrasound-guided biopsy: Grade 2 IDC, ER 50%, PR 3%, HER-2 equivocal Ki-67 15% nipple area biopsy grade 2 IDC, ER 100% positive, PR negative, HER-2 equivocal, Ki-67 40%  06/11/17:Left Mastectomy: 2 foci of IDC 2.2 and 1 cm Grade 2, 1/2 LN Positive; Grade 2 IDC, ER 50%, PR 3%, HER-2 equivocal Ki-67 15% nipple area biopsy grade 2 IDC, ER 100% positive, PR negative, HER-2 equivocal, Ki-67 40% T2N1a Stage 1B I discussed with her that given the fact that she has lymph node positive disease, this is clinically high risk disease.  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, potential benefit of treatment at 5 years: 94.6% distant metastasis free interval for patients treated with chemotherapy  Recommendation: 1.Adjuvant chemotherapy with dose dense Adriamycin and Cytoxan x4 followed by Taxol weekly x12 2.followed by radiation 3.Followed by adjuvant antiestrogen therapy ----------------------------------------------------------------------- Current treatment: Completed 4 cycles of dose dense edematous and Cytoxan, today cycle 5 Taxol being given with Granix support  Chemo Toxicities: 1.Neutropenia: Receives Granix injections prior to chemo  Fluid retention: Lasix prescribed 40 mg daily  Monitoring closely for toxicities Return to clinic weekly for Taxol and every other week for follow-up with me

## 2017-11-05 ENCOUNTER — Other Ambulatory Visit: Payer: Self-pay | Admitting: Hematology and Oncology

## 2017-11-05 DIAGNOSIS — C50412 Malignant neoplasm of upper-outer quadrant of left female breast: Secondary | ICD-10-CM

## 2017-11-05 DIAGNOSIS — Z170 Estrogen receptor positive status [ER+]: Principal | ICD-10-CM

## 2017-11-06 ENCOUNTER — Ambulatory Visit: Payer: Medicare Other

## 2017-11-06 VITALS — BP 166/80 | HR 74 | Temp 98.2°F | Resp 17 | Ht 65.0 in

## 2017-11-06 DIAGNOSIS — Z5111 Encounter for antineoplastic chemotherapy: Secondary | ICD-10-CM | POA: Diagnosis not present

## 2017-11-06 MED ORDER — TBO-FILGRASTIM 480 MCG/0.8ML ~~LOC~~ SOSY
PREFILLED_SYRINGE | SUBCUTANEOUS | Status: AC
  Filled 2017-11-06: qty 0.8

## 2017-11-06 MED ORDER — TBO-FILGRASTIM 480 MCG/0.8ML ~~LOC~~ SOSY
480.0000 ug | PREFILLED_SYRINGE | Freq: Once | SUBCUTANEOUS | Status: AC
  Administered 2017-11-06: 480 ug via SUBCUTANEOUS

## 2017-11-06 MED ORDER — TBO-FILGRASTIM 480 MCG/0.8ML ~~LOC~~ SOSY: 480.0000 ug | PREFILLED_SYRINGE | Freq: Once | SUBCUTANEOUS | Status: DC

## 2017-11-06 MED ORDER — ALTEPLASE 2 MG IJ SOLR
2.0000 mg | Freq: Once | INTRAMUSCULAR | Status: DC | PRN
  Filled 2017-11-06: qty 2

## 2017-11-06 NOTE — Patient Instructions (Signed)
Tbo-Filgrastim injection What is this medicine? TBO-FILGRASTIM (T B O fil GRA stim) is a granulocyte colony-stimulating factor that stimulates the growth of neutrophils, a type of white blood cell important in the body's fight against infection. It is used to reduce the incidence of fever and infection in patients with certain types of cancer who are receiving chemotherapy that affects the bone marrow. This medicine may be used for other purposes; ask your health care provider or pharmacist if you have questions. COMMON BRAND NAME(S): Granix What should I tell my health care provider before I take this medicine? They need to know if you have any of these conditions: -bone scan or tests planned -kidney disease -sickle cell anemia -an unusual or allergic reaction to tbo-filgrastim, filgrastim, pegfilgrastim, other medicines, foods, dyes, or preservatives -pregnant or trying to get pregnant -breast-feeding How should I use this medicine? This medicine is for injection under the skin. If you get this medicine at home, you will be taught how to prepare and give this medicine. Refer to the Instructions for Use that come with your medication packaging. Use exactly as directed. Take your medicine at regular intervals. Do not take your medicine more often than directed. It is important that you put your used needles and syringes in a special sharps container. Do not put them in a trash can. If you do not have a sharps container, call your pharmacist or healthcare provider to get one. Talk to your pediatrician regarding the use of this medicine in children. Special care may be needed. Overdosage: If you think you have taken too much of this medicine contact a poison control center or emergency room at once. NOTE: This medicine is only for you. Do not share this medicine with others. What if I miss a dose? It is important not to miss your dose. Call your doctor or health care professional if you miss a  dose. What may interact with this medicine? This medicine may interact with the following medications: -medicines that may cause a release of neutrophils, such as lithium This list may not describe all possible interactions. Give your health care provider a list of all the medicines, herbs, non-prescription drugs, or dietary supplements you use. Also tell them if you smoke, drink alcohol, or use illegal drugs. Some items may interact with your medicine. What should I watch for while using this medicine? You may need blood work done while you are taking this medicine. What side effects may I notice from receiving this medicine? Side effects that you should report to your doctor or health care professional as soon as possible: -allergic reactions like skin rash, itching or hives, swelling of the face, lips, or tongue -blood in the urine -dark urine -dizziness -fast heartbeat -feeling faint -shortness of breath or breathing problems -signs and symptoms of infection like fever or chills; cough; or sore throat -signs and symptoms of kidney injury like trouble passing urine or change in the amount of urine -stomach or side pain, or pain at the shoulder -sweating -swelling of the legs, ankles, or abdomen -tiredness Side effects that usually do not require medical attention (report to your doctor or health care professional if they continue or are bothersome): -bone pain -headache -muscle pain -vomiting This list may not describe all possible side effects. Call your doctor for medical advice about side effects. You may report side effects to FDA at 1-800-FDA-1088. Where should I keep my medicine? Keep out of the reach of children. Store in a refrigerator between   2 and 8 degrees C (36 and 46 degrees F). Keep in carton to protect from light. Throw away this medicine if it is left out of the refrigerator for more than 5 consecutive days. Throw away any unused medicine after the expiration  date. NOTE: This sheet is a summary. It may not cover all possible information. If you have questions about this medicine, talk to your doctor, pharmacist, or health care provider.  2018 Elsevier/Gold Standard (2015-04-15 19:07:04)  

## 2017-11-09 ENCOUNTER — Other Ambulatory Visit: Payer: Self-pay | Admitting: *Deleted

## 2017-11-09 DIAGNOSIS — Z95828 Presence of other vascular implants and grafts: Secondary | ICD-10-CM

## 2017-11-10 ENCOUNTER — Inpatient Hospital Stay: Payer: Medicare Other | Attending: Hematology and Oncology

## 2017-11-10 ENCOUNTER — Inpatient Hospital Stay: Payer: Medicare Other

## 2017-11-10 ENCOUNTER — Other Ambulatory Visit: Payer: Self-pay | Admitting: Hematology and Oncology

## 2017-11-10 VITALS — BP 134/72 | HR 79 | Temp 98.3°F | Resp 18

## 2017-11-10 DIAGNOSIS — T451X5A Adverse effect of antineoplastic and immunosuppressive drugs, initial encounter: Secondary | ICD-10-CM | POA: Insufficient documentation

## 2017-11-10 DIAGNOSIS — Z95828 Presence of other vascular implants and grafts: Secondary | ICD-10-CM

## 2017-11-10 DIAGNOSIS — Z9012 Acquired absence of left breast and nipple: Secondary | ICD-10-CM | POA: Diagnosis not present

## 2017-11-10 DIAGNOSIS — Z170 Estrogen receptor positive status [ER+]: Secondary | ICD-10-CM | POA: Diagnosis not present

## 2017-11-10 DIAGNOSIS — Z5111 Encounter for antineoplastic chemotherapy: Secondary | ICD-10-CM | POA: Insufficient documentation

## 2017-11-10 DIAGNOSIS — C50412 Malignant neoplasm of upper-outer quadrant of left female breast: Secondary | ICD-10-CM | POA: Insufficient documentation

## 2017-11-10 DIAGNOSIS — G620 Drug-induced polyneuropathy: Secondary | ICD-10-CM | POA: Insufficient documentation

## 2017-11-10 LAB — CMP (CANCER CENTER ONLY)
ALT: 32 U/L (ref 0–44)
AST: 20 U/L (ref 15–41)
Albumin: 3.3 g/dL — ABNORMAL LOW (ref 3.5–5.0)
Alkaline Phosphatase: 88 U/L (ref 38–126)
Anion gap: 7 (ref 5–15)
BILIRUBIN TOTAL: 0.3 mg/dL (ref 0.3–1.2)
BUN: 15 mg/dL (ref 8–23)
CALCIUM: 9 mg/dL (ref 8.9–10.3)
CO2: 32 mmol/L (ref 22–32)
Chloride: 104 mmol/L (ref 98–111)
Creatinine: 0.68 mg/dL (ref 0.44–1.00)
GFR, Est AFR Am: >60 mL/min (ref >60)
Glucose, Bld: 146 mg/dL — ABNORMAL HIGH (ref 70–99)
POTASSIUM: 3.6 mmol/L (ref 3.5–5.1)
Sodium: 143 mmol/L (ref 135–145)
TOTAL PROTEIN: 5.8 g/dL — AB (ref 6.5–8.1)

## 2017-11-10 LAB — CBC WITH DIFFERENTIAL (CANCER CENTER ONLY)
BASOS ABS: 0 10*3/uL (ref 0.0–0.1)
BASOS PCT: 1 %
EOS ABS: 0 10*3/uL (ref 0.0–0.5)
Eosinophils Relative: 0 %
HCT: 31.9 % — ABNORMAL LOW (ref 34.8–46.6)
Hemoglobin: 10 g/dL — ABNORMAL LOW (ref 11.6–15.9)
LYMPHS PCT: 24 %
Lymphs Abs: 1 10*3/uL (ref 0.9–3.3)
MCH: 31.3 pg (ref 25.1–34.0)
MCHC: 31.3 g/dL — ABNORMAL LOW (ref 31.5–36.0)
MCV: 99.7 fL (ref 79.5–101.0)
Monocytes Absolute: 0.3 10*3/uL (ref 0.1–0.9)
Monocytes Relative: 7 %
Neutro Abs: 2.7 10*3/uL (ref 1.5–6.5)
Neutrophils Relative %: 68 %
PLATELETS: 163 10*3/uL (ref 145–400)
RBC: 3.2 MIL/uL — AB (ref 3.70–5.45)
RDW: 15.5 % — ABNORMAL HIGH (ref 11.2–14.5)
WBC: 3.9 10*3/uL (ref 3.9–10.3)

## 2017-11-10 MED ORDER — SODIUM CHLORIDE 0.9 % IV SOLN
50.0000 mg/m2 | Freq: Once | INTRAVENOUS | Status: DC
  Filled 2017-11-10: qty 18

## 2017-11-10 MED ORDER — SODIUM CHLORIDE 0.9 % IV SOLN
20.0000 mg | Freq: Once | INTRAVENOUS | Status: AC
  Administered 2017-11-10: 20 mg via INTRAVENOUS
  Filled 2017-11-10: qty 2

## 2017-11-10 MED ORDER — HEPARIN SOD (PORK) LOCK FLUSH 100 UNIT/ML IV SOLN
500.0000 [IU] | Freq: Once | INTRAVENOUS | Status: AC | PRN
  Administered 2017-11-10: 500 [IU]
  Filled 2017-11-10: qty 5

## 2017-11-10 MED ORDER — SODIUM CHLORIDE 0.9 % IV SOLN
Freq: Once | INTRAVENOUS | Status: AC
  Administered 2017-11-10: 14:00:00 via INTRAVENOUS
  Filled 2017-11-10: qty 250

## 2017-11-10 MED ORDER — SODIUM CHLORIDE 0.9% FLUSH
10.0000 mL | INTRAVENOUS | Status: DC | PRN
  Administered 2017-11-10: 10 mL
  Filled 2017-11-10: qty 10

## 2017-11-10 MED ORDER — SODIUM CHLORIDE 0.9 % IV SOLN
40.0000 mg/m2 | Freq: Once | INTRAVENOUS | Status: AC
  Administered 2017-11-10: 84 mg via INTRAVENOUS
  Filled 2017-11-10: qty 14

## 2017-11-10 MED ORDER — SODIUM CHLORIDE 0.9 % IJ SOLN
10.0000 mL | Freq: Once | INTRAMUSCULAR | Status: AC
  Administered 2017-11-10: 10 mL
  Filled 2017-11-10: qty 10

## 2017-11-10 MED ORDER — FAMOTIDINE IN NACL 20-0.9 MG/50ML-% IV SOLN
INTRAVENOUS | Status: AC
  Filled 2017-11-10: qty 50

## 2017-11-10 MED ORDER — FAMOTIDINE IN NACL 20-0.9 MG/50ML-% IV SOLN
20.0000 mg | Freq: Once | INTRAVENOUS | Status: AC
  Administered 2017-11-10: 20 mg via INTRAVENOUS

## 2017-11-10 MED ORDER — GABAPENTIN 300 MG PO CAPS: 300.0000 mg | ORAL_CAPSULE | Freq: Every day | ORAL | 6 refills | Status: DC

## 2017-11-10 NOTE — Patient Instructions (Signed)
[facility] Discharge Instructions for Patients Receiving Chemotherapy  Today you received the following chemotherapy agents:  Taxol.  To help prevent nausea and vomiting after your treatment, we encourage you to take your nausea medication as directed.   If you develop nausea and vomiting that is not controlled by your nausea medication, call the clinic.   BELOW ARE SYMPTOMS THAT SHOULD BE REPORTED IMMEDIATELY:  *FEVER GREATER THAN 100.5 F  *CHILLS WITH OR WITHOUT FEVER  NAUSEA AND VOMITING THAT IS NOT CONTROLLED WITH YOUR NAUSEA MEDICATION  *UNUSUAL SHORTNESS OF BREATH  *UNUSUAL BRUISING OR BLEEDING  TENDERNESS IN MOUTH AND THROAT WITH OR WITHOUT PRESENCE OF ULCERS  *URINARY PROBLEMS  *BOWEL PROBLEMS  UNUSUAL RASH Items with * indicate a potential emergency and should be followed up as soon as possible.  Feel free to call the clinic should you have any questions or concerns. The clinic phone number is [phone].  Please show the CHEMO ALERT CARD at check-in to the Emergency Department and triage nurse.   

## 2017-11-15 ENCOUNTER — Inpatient Hospital Stay: Payer: Medicare Other

## 2017-11-15 DIAGNOSIS — Z170 Estrogen receptor positive status [ER+]: Principal | ICD-10-CM

## 2017-11-15 DIAGNOSIS — C50412 Malignant neoplasm of upper-outer quadrant of left female breast: Secondary | ICD-10-CM

## 2017-11-15 DIAGNOSIS — Z5111 Encounter for antineoplastic chemotherapy: Secondary | ICD-10-CM | POA: Diagnosis not present

## 2017-11-15 MED ORDER — TBO-FILGRASTIM 480 MCG/0.8ML ~~LOC~~ SOSY
480.0000 ug | PREFILLED_SYRINGE | Freq: Once | SUBCUTANEOUS | Status: AC
  Administered 2017-11-15: 480 ug via SUBCUTANEOUS

## 2017-11-15 MED ORDER — TBO-FILGRASTIM 480 MCG/0.8ML ~~LOC~~ SOSY
PREFILLED_SYRINGE | SUBCUTANEOUS | Status: AC
  Filled 2017-11-15: qty 0.8

## 2017-11-17 ENCOUNTER — Inpatient Hospital Stay (HOSPITAL_BASED_OUTPATIENT_CLINIC_OR_DEPARTMENT_OTHER): Payer: Medicare Other | Admitting: Hematology and Oncology

## 2017-11-17 ENCOUNTER — Inpatient Hospital Stay: Payer: Medicare Other

## 2017-11-17 ENCOUNTER — Telehealth: Payer: Self-pay | Admitting: Hematology and Oncology

## 2017-11-17 DIAGNOSIS — C50412 Malignant neoplasm of upper-outer quadrant of left female breast: Secondary | ICD-10-CM

## 2017-11-17 DIAGNOSIS — Z170 Estrogen receptor positive status [ER+]: Principal | ICD-10-CM

## 2017-11-17 DIAGNOSIS — Z9012 Acquired absence of left breast and nipple: Secondary | ICD-10-CM

## 2017-11-17 DIAGNOSIS — G620 Drug-induced polyneuropathy: Secondary | ICD-10-CM | POA: Diagnosis not present

## 2017-11-17 DIAGNOSIS — Z95828 Presence of other vascular implants and grafts: Secondary | ICD-10-CM

## 2017-11-17 DIAGNOSIS — Z5111 Encounter for antineoplastic chemotherapy: Secondary | ICD-10-CM | POA: Diagnosis not present

## 2017-11-17 LAB — CBC WITH DIFFERENTIAL (CANCER CENTER ONLY)
BASOS PCT: 1 %
Basophils Absolute: 0.1 10*3/uL (ref 0.0–0.1)
Eosinophils Absolute: 0 10*3/uL (ref 0.0–0.5)
Eosinophils Relative: 0 %
HCT: 29.8 % — ABNORMAL LOW (ref 34.8–46.6)
Hemoglobin: 9.6 g/dL — ABNORMAL LOW (ref 11.6–15.9)
Lymphocytes Relative: 8 %
Lymphs Abs: 1.3 10*3/uL (ref 0.9–3.3)
MCH: 32.1 pg (ref 25.1–34.0)
MCHC: 32.2 g/dL (ref 31.5–36.0)
MCV: 99.7 fL (ref 79.5–101.0)
MONO ABS: 0.6 10*3/uL (ref 0.1–0.9)
MONOS PCT: 4 %
Neutro Abs: 13.6 10*3/uL — ABNORMAL HIGH (ref 1.5–6.5)
Neutrophils Relative %: 87 %
Platelet Count: 260 10*3/uL (ref 145–400)
RBC: 2.99 MIL/uL — ABNORMAL LOW (ref 3.70–5.45)
RDW: 17.1 % — AB (ref 11.2–14.5)
WBC Count: 15.6 10*3/uL — ABNORMAL HIGH (ref 3.9–10.3)

## 2017-11-17 LAB — CMP (CANCER CENTER ONLY)
ALT: 31 U/L (ref 0–44)
AST: 22 U/L (ref 15–41)
Albumin: 3.2 g/dL — ABNORMAL LOW (ref 3.5–5.0)
Alkaline Phosphatase: 86 U/L (ref 38–126)
Anion gap: 9 (ref 5–15)
BILIRUBIN TOTAL: 0.3 mg/dL (ref 0.3–1.2)
BUN: 19 mg/dL (ref 8–23)
CALCIUM: 9.2 mg/dL (ref 8.9–10.3)
CHLORIDE: 104 mmol/L (ref 98–111)
CO2: 30 mmol/L (ref 22–32)
Creatinine: 0.75 mg/dL (ref 0.44–1.00)
GFR, Est AFR Am: >60 mL/min (ref >60)
GFR, Estimated: >60 mL/min (ref >60)
GLUCOSE: 136 mg/dL — AB (ref 70–99)
Potassium: 3.9 mmol/L (ref 3.5–5.1)
Sodium: 143 mmol/L (ref 135–145)
Total Protein: 6 g/dL — ABNORMAL LOW (ref 6.5–8.1)

## 2017-11-17 MED ORDER — SODIUM CHLORIDE 0.9% FLUSH
10.0000 mL | INTRAVENOUS | Status: DC | PRN
  Administered 2017-11-17: 10 mL
  Filled 2017-11-17: qty 10

## 2017-11-17 NOTE — Assessment & Plan Note (Signed)
05/13/17: Christella Scheuermann biopsy: Left breast biopsy 2 o'clock position 8 cm from nipple ultrasound-guided biopsy: Grade 2 IDC, ER 50%, PR 3%, HER-2 equivocal Ki-67 15% nipple area biopsy grade 2 IDC, ER 100% positive, PR negative, HER-2 equivocal, Ki-67 40%  06/11/17:Left Mastectomy: 2 foci of IDC 2.2 and 1 cm Grade 2, 1/2 LN Positive; Grade 2 IDC, ER 50%, PR 3%, HER-2 equivocal Ki-67 15% nipple area biopsy grade 2 IDC, ER 100% positive, PR negative, HER-2 equivocal, Ki-67 40% T2N1a Stage 1B I discussed with her that given the fact that she has lymph node positive disease, this is clinically high risk disease.  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, potential benefit of treatment at 5 years: 94.6% distant metastasis free interval for patients treated with chemotherapy  Recommendation: 1.Adjuvant chemotherapy with dose dense Adriamycin and Cytoxan x4 followed by Taxol weekly x6 (stopped for neuropathy) 07/23/2017 to 11/10/2017 2.followed by radiation to be done in [place] 3.Followed by adjuvant antiestrogen therapy ----------------------------------------------------------------------- Severe peripheral neuropathy: I discussed with the patient that neuropathy grade 2-3 is significant enough that we need to discontinue further chemo.  This is in spite of lowering the chemotherapy dosage.  Additional adverse effects include severe fatigue, epigastric discomfort, brittleness of nails etc  Patient will come back after undergoing radiation therapy and then will to begin antiestrogen treatment.  I briefly discussed antiestrogen therapy risks and benefits of letrozole.

## 2017-11-17 NOTE — Telephone Encounter (Signed)
Per [facility]G 9/11 los cancel all appointments.  Patient stopped by scheduling to cancel them.

## 2017-11-17 NOTE — Progress Notes (Signed)
Patient Care Team: Sherrilee Gilles, DO as PCP - General (Family Medicine)  DIAGNOSIS:  Encounter Diagnosis  Name Primary?  . Malignant neoplasm of upper-outer quadrant of left breast in female, estrogen receptor positive ([place])     SUMMARY OF ONCOLOGIC HISTORY:   Malignant neoplasm of upper-outer quadrant of left breast in female, estrogen receptor positive ([place])   05/13/2017 Initial Diagnosis    [place] biopsy: Left breast biopsy 2 o'clock position 8 cm from nipple ultrasound-guided biopsy: Grade 2 IDC, ER 50%, PR 3%, HER-2 equivocal Ki-67 15% nipple area biopsy grade 2 IDC, ER 100% positive, PR negative, HER-2 equivocal, Ki-67 40%    05/31/2017 Breast MRI    Left breast UOQ 230 position: 2 x 1.9 x 1.9 cm mass, second mass left breast UOQ 230 position: 1 x 1 x 0.7 cm, 2 more enhancing adjacent 3 and 5 mm nodules slightly superiorly midway between the 2 masses.  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  She is complaining of worsening neuropathy in her feet along with worsening nail changes and severe fatigue to the point that she is not able to function at home.  Because of this we are discontinuing further chemotherapy.  REVIEW OF SYSTEMS:   Constitutional: Severe fatigue Eyes:  Denies blurriness of vision Ears, nose, mouth, throat, and face: Denies mucositis or sore throat Respiratory: Denies cough, dyspnea or wheezes Cardiovascular: Denies palpitation, chest discomfort Gastrointestinal: Hunger without any taste, hemorrhoidal bleeding due to diarrhea Skin: Denies abnormal skin rashes Lymphatics: Denies new lymphadenopathy or easy bruising Neurological:Denies numbness, tingling or new weaknesses Behavioral/Psych: Mood is stable, no new changes  Extremities: No lower extremity edema  All other systems were reviewed with the patient and are negative.  I have reviewed the past medical history, past surgical history, social history and family history with the patient and they are unchanged from previous note.  ALLERGIES:  is allergic to atorvastatin; ancef [cefazolin]; vancomycin; lactose; mometasone; nasonex [mometasone furoate]; and neurontin [gabapentin].  MEDICATIONS:  Current Outpatient Medications  Medication Sig Dispense Refill  . acetaminophen (TYLENOL) 500 MG tablet Take 500 mg by mouth every 6 (six) hours as needed for moderate pain or headache.     Marland Kitchen amLODipine (NORVASC) 5 MG tablet Take 5 mg by mouth daily.    . Azelastine-Fluticasone 137-50 MCG/ACT SUSP Place 1 spray into the nose daily. 23 g 0  . azithromycin (ZITHROMAX Z-PAK) 250 MG tablet Take as indicated on the box 6 each 0  . B Complex-C (SUPER B COMPLEX PO) Take 1 tablet by mouth daily.    . carvedilol (COREG) 25 MG tablet Take 25 mg by mouth 2 (two) times daily with a meal.    . cetirizine (ZYRTEC) 10  MG tablet Take 10 mg by mouth daily as needed for allergies.     Marland Kitchen clopidogrel (PLAVIX) 75 MG tablet Take 75 mg by mouth daily.    . ferrous sulfate 325 (65 FE) MG tablet Take 325 mg by mouth daily with breakfast.    . furosemide (LASIX) 40 MG tablet Take 1 tablet (40 mg total) by mouth daily. 90 tablet 3  . lansoprazole (PREVACID) 15 MG capsule Take 15 mg by mouth daily.     Marland Kitchen lisinopril  (PRINIVIL,ZESTRIL) 40 MG tablet Take 40 mg by mouth daily.    Marland Kitchen LORazepam (ATIVAN) 0.5 MG tablet Take 1 tablet (0.5 mg total) by mouth at bedtime as needed (Nausea or vomiting). 30 tablet 0  . Polyethyl Glycol-Propyl Glycol (SYSTANE OP) Place 1 drop into both eyes daily as needed (for dry eyes).     . potassium chloride (K-DUR,KLOR-CON) 10 MEQ tablet Take 10 mEq by mouth daily.     . traMADol (ULTRAM) 50 MG tablet Take 1 tablet (50 mg total) by mouth every 6 (six) hours as needed (mild pain). 60 tablet 3   No current facility-administered medications for this visit.     PHYSICAL EXAMINATION: ECOG PERFORMANCE STATUS: 2 - Symptomatic, <50% confined to bed  Vitals:   11/17/17 1313  BP: (!) 115/51  Pulse: 78  Resp: 17  Temp: 98.4 F (36.9 C)  SpO2: 94%   Filed Weights   11/17/17 1313  Weight: 213 lb 6.4 oz (96.8 kg)    GENERAL:alert, no distress and comfortable SKIN: skin color, texture, turgor are normal, no rashes or significant lesions EYES: normal, Conjunctiva are pink and non-injected, sclera clear OROPHARYNX:no exudate, no erythema and lips, buccal mucosa, and tongue normal  NECK: supple, thyroid normal size, non-tender, without nodularity LYMPH:  no palpable lymphadenopathy in the cervical, axillary or inguinal LUNGS: clear to auscultation and percussion with normal breathing effort HEART: regular rate & rhythm and no murmurs and no lower extremity edema ABDOMEN:abdomen soft, non-tender and normal bowel sounds MUSCULOSKELETAL:no cyanosis of digits and no clubbing  NEURO: alert & oriented x 3 with fluent speech, no focal motor/sensory deficits EXTREMITIES: No lower extremity edema   LABORATORY DATA:  I have reviewed the data as listed CMP Latest Ref Rng & Units 11/17/2017 11/10/2017 11/04/2017  Glucose 70 - 99 mg/dL 136(H) 146(H) 133(H)  BUN 8 - 23 mg/dL '19 15 12  '$ Creatinine 0.44 - 1.00 mg/dL 0.75 0.68 0.71  Sodium 135 - 145 mmol/L 143 143 142  Potassium 3.5 - 5.1 mmol/L  3.9 3.6 3.7  Chloride 98 - 111 mmol/L 104 104 103  CO2 22 - 32 mmol/L 30 32 32  Calcium 8.9 - 10.3 mg/dL 9.2 9.0 9.0  Total Protein 6.5 - 8.1 g/dL 6.0(L) 5.8(L) 5.9(L)  Total Bilirubin 0.3 - 1.2 mg/dL 0.3 0.3 0.2(L)  Alkaline Phos 38 - 126 U/L 86 88 90  AST 15 - 41 U/L '22 20 30  '$ ALT 0 - 44 U/L 31 32 29    Lab Results  Component Value Date   WBC 15.6 (H) 11/17/2017   HGB 9.6 (L) 11/17/2017   HCT 29.8 (L) 11/17/2017   MCV 99.7 11/17/2017   PLT 260 11/17/2017   NEUTROABS 13.6 (H) 11/17/2017    ASSESSMENT & PLAN:  Malignant neoplasm of upper-outer quadrant of left breast in female, estrogen receptor positive ([place]) 05/13/17: [place] biopsy: Left breast biopsy 2 o'clock position 8 cm from nipple ultrasound-guided biopsy: Grade 2 IDC, ER 50%, PR  3%, HER-2 equivocal Ki-67 15% nipple area biopsy grade 2 IDC, ER 100% positive, PR negative, HER-2 equivocal, Ki-67 40%  06/11/17:Left Mastectomy: 2 foci of IDC 2.2 and 1 cm Grade 2, 1/2 LN Positive; Grade 2 IDC, ER 50%, PR 3%, HER-2 equivocal Ki-67 15% nipple area biopsy grade 2 IDC, ER 100% positive, PR negative, HER-2 equivocal, Ki-67 40% T2N1a Stage 1B I discussed with her that given the fact that she has lymph node positive disease, this is clinically high risk disease.  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, potential benefit of treatment at 5 years: 94.6% distant metastasis free interval for patients treated with chemotherapy  Recommendation: 1.Adjuvant chemotherapy with dose dense Adriamycin and Cytoxan x4 followed by Taxol weekly x6 (stopped for neuropathy) 07/23/2017 to 11/10/2017 2.followed by radiation to be done in [place] 3.Followed by adjuvant antiestrogen therapy with letrozole 2.5 mg daily x5 to 7 years ----------------------------------------------------------------------- Severe peripheral neuropathy: I discussed with the patient that neuropathy grade 2-3 is significant enough that we need to discontinue further  chemo.  This is in spite of lowering the chemotherapy dosage.  Additional adverse effects include severe fatigue, epigastric discomfort, brittleness of nails etc  Patient will come back after undergoing radiation therapy and then will to begin antiestrogen treatment.  I briefly discussed antiestrogen therapy risks and benefits of letrozole.     No orders of the defined types were placed in this encounter.  The patient has a good understanding of the overall plan. she agrees with it. she will call with any problems that may develop before the next visit here.   Harriette Ohara, MD 11/17/17

## 2017-11-22 ENCOUNTER — Encounter: Payer: Self-pay | Admitting: Hematology and Oncology

## 2017-11-22 ENCOUNTER — Ambulatory Visit: Payer: Medicare Other

## 2017-11-22 NOTE — Progress Notes (Signed)
Referral sent to [facility] per Dr. Lindi Adie, faxed office notes to [phone], confirmation received.

## 2017-11-23 ENCOUNTER — Other Ambulatory Visit: Payer: Self-pay | Admitting: Surgery

## 2017-11-24 ENCOUNTER — Other Ambulatory Visit: Payer: Medicare Other

## 2017-11-24 ENCOUNTER — Ambulatory Visit: Payer: Medicare Other

## 2017-11-24 ENCOUNTER — Encounter (HOSPITAL_BASED_OUTPATIENT_CLINIC_OR_DEPARTMENT_OTHER): Payer: Self-pay | Admitting: *Deleted

## 2017-11-24 NOTE — Progress Notes (Signed)
Pt states that she uses 2L N/C at home at all times. [facility] scheduler at Dr Trevor Mace office called and notified that this does not meet our outpatient criteria for [facility]. Also requested that the nurse ask Dr. Ninfa Linden if pt will need to stop Plavix and to add it the special needs section when she does reschedule.

## 2017-11-26 ENCOUNTER — Encounter (HOSPITAL_COMMUNITY): Payer: Self-pay

## 2017-11-26 NOTE — Progress Notes (Addendum)
LOV PULM , DR Pearline Cables 11-12-17 Epic   CBCDIFF,CMP 11-17-17 Epic   PULM FX TEST 11-02-17 Epic   ECHO 10-04-17 Epic

## 2017-11-26 NOTE — Patient Instructions (Addendum)
Breanna [name]  11/26/2017   Your procedure is scheduled on: 12-01-17   Report to [facility] Main  Entrance    Report to admitting at 9:00AM    Call this number if you have problems the morning of surgery [phone]     Remember: NO SOLID FOOD AFTER MIDNIGHT THE NIGHT PRIOR TO SURGERY. NOTHING BY MOUTH EXCEPT CLEAR LIQUIDS UNTIL 3 HOURS PRIOR TO [place] SURGERY. PLEASE FINISH ENSURE DRINK PER SURGEON ORDER 3 HOURS PRIOR TO SCHEDULED SURGERY TIME WHICH NEEDS TO BE COMPLETED AT ___8:00AM____.Marland Kitchen BRUSH YOUR TEETH MORNING OF SURGERY AND RINSE YOUR MOUTH OUT, NO CHEWING GUM CANDY OR MINTS.      CLEAR LIQUID DIET   Foods Allowed                                                                     Foods Excluded  Coffee and tea, regular and decaf                             liquids that you cannot  Plain Jell-O in any flavor                                             see through such as: Fruit ices (not with fruit pulp)                                     milk, soups, orange juice  Iced Popsicles                                    All solid food Carbonated beverages, regular and diet                                    Cranberry, grape and apple juices Sports drinks like Gatorade Lightly seasoned clear broth or consume(fat free) Sugar, honey syrup  Sample Menu Breakfast                                Lunch                                     Supper Cranberry juice                    Beef broth                            Chicken broth Jell-O  Grape juice                           Apple juice Coffee or tea                        Jell-O                                      Popsicle                                                Coffee or tea                        Coffee or tea  _____________________________________________________________________       Take these medicines the morning of surgery with A SIP OF WATER:  CARVEDILOL, ZYRTEC, LANSOPRAZOLE(PREVACID)                                 You may not have any metal on your body including hair pins and              piercings  Do not wear jewelry, make-up, lotions, powders or perfumes, deodorant             Do not wear nail polish.  Do not shave  48 hours prior to surgery.            Do not bring valuables to the hospital. [place].  Contacts, dentures or bridgework may not be worn into surgery.       Patients discharged the day of surgery will not be allowed to drive home.  Name and phone number of your driver:  Special Instructions: N/A              Please read over the following fact sheets you were given: _____________________________________________________________________             [facility] - Preparing for Surgery Before surgery, you can play an important role.  Because skin is not sterile, your skin needs to be as free of germs as possible.  You can reduce the number of germs on your skin by washing with CHG (chlorahexidine gluconate) soap before surgery.  CHG is an antiseptic cleaner which kills germs and bonds with the skin to continue killing germs even after washing. Please DO NOT use if you have an allergy to CHG or antibacterial soaps.  If your skin becomes reddened/irritated stop using the CHG and inform your nurse when you arrive at Short Stay. Do not shave (including legs and underarms) for at least 48 hours prior to the first CHG shower.  You may shave your face/neck. Please follow these instructions carefully:  1.  Shower with CHG Soap the night before surgery and the  morning of Surgery.  2.  If you choose to wash your hair, wash your hair first as usual with your  normal  shampoo.  3.  After you shampoo, rinse your hair and body thoroughly to remove the  shampoo.  4.  Use CHG as you would any other liquid soap.  You can apply chg directly  to the skin  and wash                       Gently with a scrungie or clean washcloth.  5.  Apply the CHG Soap to your body ONLY FROM THE NECK DOWN.   Do not use on face/ open                           Wound or open sores. Avoid contact with eyes, ears mouth and genitals (private parts).                       Wash face,  Genitals (private parts) with your normal soap.             6.  Wash thoroughly, paying special attention to the area where your surgery  will be performed.  7.  Thoroughly rinse your body with warm water from the neck down.  8.  DO NOT shower/wash with your normal soap after using and rinsing off  the CHG Soap.                9.  Pat yourself dry with a clean towel.            10.  Wear clean pajamas.            11.  Place clean sheets on your bed the night of your first shower and do not  sleep with pets. Day of Surgery : Do not apply any lotions/deodorants the morning of surgery.  Please wear clean clothes to the hospital/surgery center.  FAILURE TO FOLLOW THESE INSTRUCTIONS MAY RESULT IN THE CANCELLATION OF YOUR SURGERY PATIENT SIGNATURE_________________________________  NURSE SIGNATURE__________________________________  ________________________________________________________________________

## 2017-11-26 NOTE — Progress Notes (Signed)
LOV PULM VISIT, PROGRESS NOTES INDICATE THAT PATIENT HAS COLLAPSED RIGHT DIAPHRAGM AND IS RECOMMENDED USE OF SUPPLEMENTAL OXYGEN.  IN PREP FOR PATIENT PRE-OP APPT ON 9-23, RN CONTACTED ANESTHESIA DR Adele Barthel TO DETERMINE IF PATIENT WOULD NEED TO BE ASSESSED BY ANESTHESIA AT PRE-OP APPT. PER DR ELLENDER, UPON OBTAINING VITALS, IF O2 SAT IS LESS THAN 92%, PATIENT WILL NEED TO BEEN SEEN BY ANESTHESIA AT PRE-OP APPT; THIS WOULD ALSO APPLY IF PATIENT REPORTS HER BASELINE 02 SAT IS ANYTHING LESS THAN 90. RN VERBALIZED UNDERSTANDING. RN WILL INCORPORATE ANESTHESIA RECOMMENDATIONS AT UPCOMING PRE-OP APPT.

## 2017-11-29 ENCOUNTER — Encounter (HOSPITAL_COMMUNITY)
Admission: RE | Admit: 2017-11-29 | Discharge: 2017-11-29 | Disposition: A | Discharge status: Home / Self Care | Payer: Medicare Other | Source: Ambulatory Visit | Attending: Surgery | Admitting: Surgery

## 2017-11-29 ENCOUNTER — Other Ambulatory Visit: Payer: Self-pay

## 2017-11-29 ENCOUNTER — Ambulatory Visit: Payer: Medicare Other

## 2017-11-29 ENCOUNTER — Encounter (HOSPITAL_COMMUNITY): Payer: Self-pay

## 2017-11-29 DIAGNOSIS — Z8249 Family history of ischemic heart disease and other diseases of the circulatory system: Secondary | ICD-10-CM | POA: Diagnosis not present

## 2017-11-29 DIAGNOSIS — Z87891 Personal history of nicotine dependence: Secondary | ICD-10-CM | POA: Diagnosis not present

## 2017-11-29 DIAGNOSIS — Z01812 Encounter for preprocedural laboratory examination: Secondary | ICD-10-CM

## 2017-11-29 DIAGNOSIS — Z9221 Personal history of antineoplastic chemotherapy: Secondary | ICD-10-CM | POA: Diagnosis not present

## 2017-11-29 DIAGNOSIS — M19071 Primary osteoarthritis, right ankle and foot: Secondary | ICD-10-CM | POA: Diagnosis not present

## 2017-11-29 DIAGNOSIS — M19012 Primary osteoarthritis, left shoulder: Secondary | ICD-10-CM | POA: Diagnosis not present

## 2017-11-29 DIAGNOSIS — Z452 Encounter for adjustment and management of vascular access device: Secondary | ICD-10-CM

## 2017-11-29 DIAGNOSIS — M479 Spondylosis, unspecified: Secondary | ICD-10-CM | POA: Diagnosis not present

## 2017-11-29 DIAGNOSIS — Z7902 Long term (current) use of antithrombotics/antiplatelets: Secondary | ICD-10-CM | POA: Diagnosis not present

## 2017-11-29 DIAGNOSIS — Z9981 Dependence on supplemental oxygen: Secondary | ICD-10-CM | POA: Diagnosis not present

## 2017-11-29 DIAGNOSIS — I10 Essential (primary) hypertension: Secondary | ICD-10-CM | POA: Diagnosis not present

## 2017-11-29 DIAGNOSIS — Z96652 Presence of left artificial knee joint: Secondary | ICD-10-CM | POA: Diagnosis not present

## 2017-11-29 DIAGNOSIS — Z9012 Acquired absence of left breast and nipple: Secondary | ICD-10-CM | POA: Diagnosis not present

## 2017-11-29 DIAGNOSIS — M19072 Primary osteoarthritis, left ankle and foot: Secondary | ICD-10-CM | POA: Diagnosis not present

## 2017-11-29 DIAGNOSIS — J449 Chronic obstructive pulmonary disease, unspecified: Secondary | ICD-10-CM | POA: Diagnosis not present

## 2017-11-29 DIAGNOSIS — Z888 Allergy status to other drugs, medicaments and biological substances status: Secondary | ICD-10-CM | POA: Diagnosis not present

## 2017-11-29 DIAGNOSIS — Z8673 Personal history of transient ischemic attack (TIA), and cerebral infarction without residual deficits: Secondary | ICD-10-CM | POA: Diagnosis not present

## 2017-11-29 DIAGNOSIS — K219 Gastro-esophageal reflux disease without esophagitis: Secondary | ICD-10-CM | POA: Diagnosis not present

## 2017-11-29 DIAGNOSIS — Z981 Arthrodesis status: Secondary | ICD-10-CM | POA: Diagnosis not present

## 2017-11-29 DIAGNOSIS — M19011 Primary osteoarthritis, right shoulder: Secondary | ICD-10-CM | POA: Diagnosis not present

## 2017-11-29 DIAGNOSIS — Z881 Allergy status to other antibiotic agents status: Secondary | ICD-10-CM | POA: Diagnosis not present

## 2017-11-29 DIAGNOSIS — F410 Panic disorder [episodic paroxysmal anxiety] without agoraphobia: Secondary | ICD-10-CM | POA: Diagnosis not present

## 2017-11-29 DIAGNOSIS — Z79899 Other long term (current) drug therapy: Secondary | ICD-10-CM | POA: Diagnosis not present

## 2017-11-29 DIAGNOSIS — M1711 Unilateral primary osteoarthritis, right knee: Secondary | ICD-10-CM | POA: Diagnosis not present

## 2017-11-29 DIAGNOSIS — Z853 Personal history of malignant neoplasm of breast: Secondary | ICD-10-CM | POA: Diagnosis not present

## 2017-11-29 LAB — PROTIME-INR
INR: 0.86
Prothrombin Time: 11.7 seconds (ref 11.4–15.2)

## 2017-11-29 NOTE — Progress Notes (Signed)
AT PRE-OP APPT, PT REPORTS SHE HAD AN EKG AT HER PCP OFFICE IN [place]. RN ATTEMPTED TO CALL BUT LINE IS BUSY . RN WILL CONTINUE TO F/U TO GET RECORD OF EKG. PATIENT AWARE THAT IF WE DO NOT OBTAIN COPY OF EKG , SHE WILL NEED TO HAVE IT DONE DAY OF SURGERY.

## 2017-11-29 NOTE — Progress Notes (Signed)
(  SEE PREVIOUS NOTE FOR BACKGROUND)  CALLED BACK AND SPOKE TO JANET IN THE PRIMARY CARE DEPT , SHE REPORTS NO EKG IN THEIR SYSTEM AND TRANSFERRED RN TO CARDIOVASCULAR DEPT. SPOKE WITH BETHANY WHO SAID THAT SHE NEEDED TO TRANSFER RN TO THE NURSING OFFICE. RN KEPT ON HOLD FOR OVER 15 MINUTES WITH NO ANSWER. RN WILL SEND FAX REQUEST SINCE UNABLE TO SPEAK WITH PERSONNEL

## 2017-11-30 ENCOUNTER — Encounter (HOSPITAL_COMMUNITY): Payer: Self-pay | Admitting: *Deleted
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
## 2017-12-01 ENCOUNTER — Ambulatory Visit (HOSPITAL_COMMUNITY)
Admission: RE | Admit: 2017-12-01 | Discharge: 2017-12-01 | Disposition: A | Discharge status: Home / Self Care | Payer: Medicare Other | Source: Ambulatory Visit | Attending: Surgery | Admitting: Surgery

## 2017-12-01 ENCOUNTER — Ambulatory Visit (HOSPITAL_COMMUNITY): Payer: Medicare Other | Admitting: Anesthesiology

## 2017-12-01 ENCOUNTER — Ambulatory Visit: Payer: Medicare Other | Admitting: Hematology and Oncology

## 2017-12-01 ENCOUNTER — Encounter (HOSPITAL_COMMUNITY): Payer: Self-pay

## 2017-12-01 ENCOUNTER — Encounter (HOSPITAL_COMMUNITY)
Admission: RE | Disposition: A | Discharge status: Home / Self Care | Payer: Self-pay | Source: Ambulatory Visit | Attending: Surgery

## 2017-12-01 ENCOUNTER — Other Ambulatory Visit: Payer: Medicare Other

## 2017-12-01 ENCOUNTER — Ambulatory Visit: Payer: Medicare Other

## 2017-12-01 DIAGNOSIS — F410 Panic disorder [episodic paroxysmal anxiety] without agoraphobia: Secondary | ICD-10-CM | POA: Insufficient documentation

## 2017-12-01 DIAGNOSIS — M479 Spondylosis, unspecified: Secondary | ICD-10-CM | POA: Insufficient documentation

## 2017-12-01 DIAGNOSIS — Z9221 Personal history of antineoplastic chemotherapy: Secondary | ICD-10-CM | POA: Insufficient documentation

## 2017-12-01 DIAGNOSIS — Z853 Personal history of malignant neoplasm of breast: Secondary | ICD-10-CM | POA: Insufficient documentation

## 2017-12-01 DIAGNOSIS — Z7902 Long term (current) use of antithrombotics/antiplatelets: Secondary | ICD-10-CM | POA: Insufficient documentation

## 2017-12-01 DIAGNOSIS — J449 Chronic obstructive pulmonary disease, unspecified: Secondary | ICD-10-CM | POA: Insufficient documentation

## 2017-12-01 DIAGNOSIS — M19011 Primary osteoarthritis, right shoulder: Secondary | ICD-10-CM | POA: Insufficient documentation

## 2017-12-01 DIAGNOSIS — Z452 Encounter for adjustment and management of vascular access device: Secondary | ICD-10-CM | POA: Diagnosis not present

## 2017-12-01 DIAGNOSIS — M19012 Primary osteoarthritis, left shoulder: Secondary | ICD-10-CM | POA: Diagnosis not present

## 2017-12-01 DIAGNOSIS — Z9981 Dependence on supplemental oxygen: Secondary | ICD-10-CM | POA: Insufficient documentation

## 2017-12-01 DIAGNOSIS — I10 Essential (primary) hypertension: Secondary | ICD-10-CM | POA: Insufficient documentation

## 2017-12-01 DIAGNOSIS — Z96652 Presence of left artificial knee joint: Secondary | ICD-10-CM | POA: Insufficient documentation

## 2017-12-01 DIAGNOSIS — M19071 Primary osteoarthritis, right ankle and foot: Secondary | ICD-10-CM | POA: Insufficient documentation

## 2017-12-01 DIAGNOSIS — Z9012 Acquired absence of left breast and nipple: Secondary | ICD-10-CM | POA: Insufficient documentation

## 2017-12-01 DIAGNOSIS — M19072 Primary osteoarthritis, left ankle and foot: Secondary | ICD-10-CM | POA: Insufficient documentation

## 2017-12-01 DIAGNOSIS — K219 Gastro-esophageal reflux disease without esophagitis: Secondary | ICD-10-CM | POA: Insufficient documentation

## 2017-12-01 DIAGNOSIS — Z981 Arthrodesis status: Secondary | ICD-10-CM | POA: Insufficient documentation

## 2017-12-01 DIAGNOSIS — Z79899 Other long term (current) drug therapy: Secondary | ICD-10-CM | POA: Insufficient documentation

## 2017-12-01 DIAGNOSIS — M1711 Unilateral primary osteoarthritis, right knee: Secondary | ICD-10-CM | POA: Insufficient documentation

## 2017-12-01 DIAGNOSIS — Z87891 Personal history of nicotine dependence: Secondary | ICD-10-CM | POA: Insufficient documentation

## 2017-12-01 DIAGNOSIS — Z888 Allergy status to other drugs, medicaments and biological substances status: Secondary | ICD-10-CM | POA: Insufficient documentation

## 2017-12-01 DIAGNOSIS — Z881 Allergy status to other antibiotic agents status: Secondary | ICD-10-CM | POA: Insufficient documentation

## 2017-12-01 DIAGNOSIS — Z8249 Family history of ischemic heart disease and other diseases of the circulatory system: Secondary | ICD-10-CM | POA: Insufficient documentation

## 2017-12-01 DIAGNOSIS — Z8673 Personal history of transient ischemic attack (TIA), and cerebral infarction without residual deficits: Secondary | ICD-10-CM | POA: Insufficient documentation

## 2017-12-01 HISTORY — PX: PORT-A-CATH REMOVAL: SHX5289

## 2017-12-01 SURGERY — REMOVAL PORT-A-CATH
Anesthesia: Monitor Anesthesia Care

## 2017-12-01 MED ORDER — FENTANYL CITRATE (PF) 100 MCG/2ML IJ SOLN
INTRAMUSCULAR | Status: AC
  Filled 2017-12-01: qty 2

## 2017-12-01 MED ORDER — BUPIVACAINE HCL (PF) 0.5 % IJ SOLN
INTRAMUSCULAR | Status: AC
  Filled 2017-12-01: qty 30

## 2017-12-01 MED ORDER — CIPROFLOXACIN IN D5W 400 MG/200ML IV SOLN
400.0000 mg | INTRAVENOUS | Status: AC
  Administered 2017-12-01: 400 mg via INTRAVENOUS
  Filled 2017-12-01: qty 200

## 2017-12-01 MED ORDER — ACETAMINOPHEN 500 MG PO TABS
1000.0000 mg | ORAL_TABLET | ORAL | Status: AC
  Administered 2017-12-01: 1000 mg via ORAL
  Filled 2017-12-01: qty 2

## 2017-12-01 MED ORDER — PROPOFOL 10 MG/ML IV BOLUS
INTRAVENOUS | Status: AC
  Filled 2017-12-01: qty 20

## 2017-12-01 MED ORDER — LIDOCAINE HCL (PF) 1 % IJ SOLN
INTRAMUSCULAR | Status: DC | PRN
  Administered 2017-12-01: 4 mL

## 2017-12-01 MED ORDER — LACTATED RINGERS IV SOLN
INTRAVENOUS | Status: DC
  Administered 2017-12-01: 10:00:00 via INTRAVENOUS

## 2017-12-01 MED ORDER — CHLORHEXIDINE GLUCONATE CLOTH 2 % EX PADS: 6.0000 | MEDICATED_PAD | Freq: Once | CUTANEOUS | Status: DC

## 2017-12-01 MED ORDER — PROPOFOL 10 MG/ML IV BOLUS
INTRAVENOUS | Status: AC
  Filled 2017-12-01: qty 40

## 2017-12-01 MED ORDER — PROPOFOL 500 MG/50ML IV EMUL
INTRAVENOUS | Status: DC | PRN
  Administered 2017-12-01: 50 ug/kg/min via INTRAVENOUS

## 2017-12-01 MED ORDER — 0.9 % SODIUM CHLORIDE (POUR BTL) OPTIME
TOPICAL | Status: DC | PRN
  Administered 2017-12-01: 1000 mL

## 2017-12-01 MED ORDER — FENTANYL CITRATE (PF) 100 MCG/2ML IJ SOLN
INTRAMUSCULAR | Status: DC | PRN
  Administered 2017-12-01: 100 ug via INTRAVENOUS

## 2017-12-01 MED ORDER — FENTANYL CITRATE (PF) 100 MCG/2ML IJ SOLN
25.0000 ug | INTRAMUSCULAR | Status: DC | PRN
  Administered 2017-12-01: 50 ug via INTRAVENOUS

## 2017-12-01 SURGICAL SUPPLY — 26 items
BANDAGE ADH SHEER 1  50/CT (GAUZE/BANDAGES/DRESSINGS) IMPLANT
BENZOIN TINCTURE PRP APPL 2/3 (GAUZE/BANDAGES/DRESSINGS) IMPLANT
BLADE SURG 15 STRL LF DISP TIS (BLADE) ×1 IMPLANT
BLADE SURG 15 STRL SS (BLADE) ×2
CHLORAPREP W/TINT 26ML (MISCELLANEOUS) ×3 IMPLANT
CLOSURE WOUND 1/2 X4 (GAUZE/BANDAGES/DRESSINGS)
DECANTER SPIKE VIAL GLASS SM (MISCELLANEOUS) ×3 IMPLANT
DERMABOND ADVANCED (GAUZE/BANDAGES/DRESSINGS) ×2
DERMABOND ADVANCED .7 DNX12 (GAUZE/BANDAGES/DRESSINGS) ×1 IMPLANT
DRAPE LAPAROTOMY TRNSV 102X78 (DRAPE) ×3 IMPLANT
ELECT PENCIL ROCKER SW 15FT (MISCELLANEOUS) ×3 IMPLANT
ELECT REM PT RETURN 15FT ADLT (MISCELLANEOUS) ×3 IMPLANT
GLOVE SURG SIGNA 7.5 PF LTX (GLOVE) ×3 IMPLANT
GOWN STRL REUS W/TWL XL LVL3 (GOWN DISPOSABLE) ×6 IMPLANT
KIT BASIN OR (CUSTOM PROCEDURE TRAY) ×3 IMPLANT
NEEDLE HYPO 22GX1.5 SAFETY (NEEDLE) ×3 IMPLANT
PACK BASIC VI WITH GOWN DISP (CUSTOM PROCEDURE TRAY) ×3 IMPLANT
SPONGE LAP 4X18 RFD (DISPOSABLE) ×3 IMPLANT
STRIP CLOSURE SKIN 1/2X4 (GAUZE/BANDAGES/DRESSINGS) IMPLANT
SUT MNCRL AB 4-0 PS2 18 (SUTURE) ×3 IMPLANT
SUT VIC AB 3-0 SH 27 (SUTURE) ×2
SUT VIC AB 3-0 SH 27XBRD (SUTURE) ×1 IMPLANT
SYR 20CC LL (SYRINGE) ×3 IMPLANT
TOWEL OR 17X26 10 PK STRL BLUE (TOWEL DISPOSABLE) ×3 IMPLANT
TOWEL OR NON WOVEN STRL DISP B (DISPOSABLE) ×3 IMPLANT
YANKAUER SUCT BULB TIP 10FT TU (MISCELLANEOUS) IMPLANT

## 2017-12-01 NOTE — Discharge Instructions (Signed)
Ok to shower starting tomorrow  Ice pack, tylenol, ibuprofen for pain  No vigorous activity for one week 

## 2017-12-01 NOTE — Transfer of Care (Signed)
Immediate Anesthesia Transfer of Care Note  Patient: Breanna [name]  Procedure(s) Performed: REMOVAL PORT-A-CATH (N/A )  Patient Location: PACU  Anesthesia Type:MAC  Level of Consciousness: awake, alert  and oriented  Airway & Oxygen Therapy: Patient Spontanous Breathing and Patient connected to face mask oxygen  Post-op Assessment: Report given to RN and Post -op Vital signs reviewed and stable  Post vital signs: Reviewed and stable  Last Vitals:  Vitals Value Taken Time  BP 114/62 12/01/2017 11:25 AM  Temp    Pulse 60 12/01/2017 11:27 AM  Resp 17 12/01/2017 11:27 AM  SpO2 100 % 12/01/2017 11:27 AM  Vitals shown include unvalidated device data.  Last Pain:  Vitals:   12/01/17 0903  TempSrc: Oral         Complications: No apparent anesthesia complications

## 2017-12-01 NOTE — Op Note (Signed)
REMOVAL PORT-A-CATH  Procedure Note  Breanna [name] 12/01/2017   Pre-op Diagnosis: PORT NO LONGER NEEDED, THERAPY COMPLETE     Post-op Diagnosis: same  Procedure(s): REMOVAL PORT-A-CATH  Surgeon(s): Coralie Keens, MD  Anesthesia: Choice  Staff:  Circulator: Anderson Malta, RN Relief Circulator: Philomena Doheny, RN Scrub Person: Jilda Roche M, CST  Estimated Blood Loss: Minimal               Procedure: The patient was brought to the operating room and identified as the correct patient.  She is placed upon the operating table and anesthesia was induced.  Her right chest was then prepped and draped in usual sterile fashion.  I anesthetized the skin of the Port-A-Cath site with lidocaine.  I made incision through the previous scar with the scalpel.  I then dissected down to the port which was easily identified.  There were no sutures holding the port in place.  I easily remove the port and the entire length of catheter.  I then closed the tract with a figure-of-eight 3-0 Vicryl suture.  I then closed the subtenons tissue with interrupted 3-0 Vicryl sutures and closed skin with a running 4-0 Monocryl.  Hemostasis appeared to be achieved.  Dermabond was then applied.  Patient tolerated procedure well.  All the counts were correct at the end of the procedure.  Patient was then taken in stable condition from the operating room to the recovery room.          Tonya Carlile A   Date: 12/01/2017  Time: 11:19 AM

## 2017-12-01 NOTE — Anesthesia Postprocedure Evaluation (Signed)
Anesthesia Post Note  Patient: Breanna [name]  Procedure(s) Performed: REMOVAL PORT-A-CATH (N/A )     Patient location during evaluation: PACU Anesthesia Type: MAC Level of consciousness: awake and alert Pain management: pain level controlled Vital Signs Assessment: post-procedure vital signs reviewed and stable Respiratory status: spontaneous breathing, nonlabored ventilation, respiratory function stable and patient connected to nasal cannula oxygen Cardiovascular status: stable and blood pressure returned to baseline Postop Assessment: no apparent nausea or vomiting Anesthetic complications: no    Last Vitals:  Vitals:   12/01/17 1230 12/01/17 1245  BP: 126/70 138/81  Pulse: 62 61  Resp: 14 14  Temp:  36.7 C  SpO2: 99% 98%    Last Pain:  Vitals:   12/01/17 1245  TempSrc:   PainSc: 2                  Tiajuana Amass

## 2017-12-01 NOTE — Anesthesia Preprocedure Evaluation (Addendum)
Anesthesia Evaluation  Patient identified by MRN, date of birth, ID band Patient awake    Reviewed: Allergy & Precautions, NPO status , Patient's Chart, lab work & pertinent test results  Airway Mallampati: II  TM Distance: >3 FB Neck ROM: Full    Dental  (+) Dental Advisory Given   Pulmonary COPD, former smoker,    breath sounds clear to auscultation       Cardiovascular hypertension, Pt. on medications and Pt. on home beta blockers  Rhythm:Regular Rate:Normal     Neuro/Psych TIA Neuromuscular disease    GI/Hepatic Neg liver ROS, hiatal hernia, GERD  ,  Endo/Other  negative endocrine ROS  Renal/GU negative Renal ROS     Musculoskeletal  (+) Arthritis ,   Abdominal   Peds  Hematology  (+) anemia ,   Anesthesia Other Findings   Reproductive/Obstetrics                             Lab Results  Component Value Date   WBC 15.6 (H) 11/17/2017   HGB 9.6 (L) 11/17/2017   HCT 29.8 (L) 11/17/2017   MCV 99.7 11/17/2017   PLT 260 11/17/2017   Lab Results  Component Value Date   CREATININE 0.75 11/17/2017   BUN 19 11/17/2017   NA 143 11/17/2017   K 3.9 11/17/2017   CL 104 11/17/2017   CO2 30 11/17/2017   ' Anesthesia Physical Anesthesia Plan  ASA: III  Anesthesia Plan: MAC   Post-op Pain Management:    Induction: Intravenous  PONV Risk Score and Plan: 2 and Propofol infusion, Ondansetron and Treatment may vary due to age or medical condition  Airway Management Planned: Natural Airway and Simple Face Mask  Additional Equipment:   Intra-op Plan:   Post-operative Plan:   Informed Consent: I have reviewed the patients History and Physical, chart, labs and discussed the procedure including the risks, benefits and alternatives for the proposed anesthesia with the patient or authorized representative who has indicated his/her understanding and acceptance.     Plan Discussed  with: CRNA  Anesthesia Plan Comments:        Anesthesia Quick Evaluation

## 2017-12-02 ENCOUNTER — Encounter (HOSPITAL_COMMUNITY): Payer: Self-pay | Admitting: Surgery

## 2017-12-06 ENCOUNTER — Ambulatory Visit: Payer: Medicare Other

## 2017-12-08 ENCOUNTER — Ambulatory Visit: Payer: Medicare Other

## 2017-12-08 ENCOUNTER — Other Ambulatory Visit: Payer: Medicare Other

## 2017-12-08 ENCOUNTER — Ambulatory Visit: Payer: Medicare Other | Admitting: Hematology and Oncology

## 2017-12-09 ENCOUNTER — Inpatient Hospital Stay: Payer: Medicare Other | Admitting: Hematology and Oncology

## 2017-12-13 ENCOUNTER — Ambulatory Visit: Payer: Medicare Other

## 2017-12-14 ENCOUNTER — Encounter: Payer: Self-pay | Admitting: Hematology and Oncology

## 2017-12-15 ENCOUNTER — Other Ambulatory Visit: Payer: Medicare Other

## 2017-12-15 ENCOUNTER — Encounter: Payer: Self-pay | Admitting: Hematology and Oncology

## 2017-12-15 ENCOUNTER — Ambulatory Visit: Payer: Medicare Other

## 2017-12-15 ENCOUNTER — Ambulatory Visit: Payer: Medicare Other | Admitting: Hematology and Oncology

## 2017-12-16 NOTE — Telephone Encounter (Signed)
This message was intended for another office. Will close message.

## 2017-12-17 ENCOUNTER — Inpatient Hospital Stay: Payer: Medicare Other | Attending: Hematology and Oncology | Admitting: Hematology and Oncology

## 2017-12-17 VITALS — BP 104/91 | HR 72 | Temp 97.8°F | Resp 20 | Ht 65.0 in | Wt 218.4 lb

## 2017-12-17 DIAGNOSIS — Z5112 Encounter for antineoplastic immunotherapy: Secondary | ICD-10-CM | POA: Insufficient documentation

## 2017-12-17 DIAGNOSIS — Z9012 Acquired absence of left breast and nipple: Secondary | ICD-10-CM | POA: Insufficient documentation

## 2017-12-17 DIAGNOSIS — C50412 Malignant neoplasm of upper-outer quadrant of left female breast: Secondary | ICD-10-CM | POA: Insufficient documentation

## 2017-12-17 DIAGNOSIS — Z79899 Other long term (current) drug therapy: Secondary | ICD-10-CM | POA: Diagnosis not present

## 2017-12-17 DIAGNOSIS — Z170 Estrogen receptor positive status [ER+]: Secondary | ICD-10-CM | POA: Diagnosis not present

## 2017-12-17 DIAGNOSIS — Z5111 Encounter for antineoplastic chemotherapy: Secondary | ICD-10-CM

## 2017-12-17 DIAGNOSIS — Z9221 Personal history of antineoplastic chemotherapy: Secondary | ICD-10-CM | POA: Insufficient documentation

## 2017-12-17 MED ORDER — LIDOCAINE-PRILOCAINE 2.5-2.5 % EX CREA: TOPICAL_CREAM | CUTANEOUS | 3 refills | Status: DC

## 2017-12-17 NOTE — Progress Notes (Signed)
Patient Care Team: Sherrilee Gilles, DO as PCP - General (Family Medicine)  DIAGNOSIS:  Encounter Diagnosis  Name Primary?  . Malignant neoplasm of upper-outer quadrant of left breast in female, estrogen receptor positive ([place])     SUMMARY OF ONCOLOGIC HISTORY:   Malignant neoplasm of upper-outer quadrant of left breast in female, estrogen receptor positive ([place])   05/13/2017 Initial Diagnosis    [place] biopsy: Left breast biopsy 2 o'clock position 8 cm from nipple ultrasound-guided biopsy: Grade 2 IDC, ER 50%, PR 3%, HER-2 equivocal Ki-67 15% nipple area biopsy grade 2 IDC, ER 100% positive, PR negative, HER-2 equivocal, Ki-67 40%    05/31/2017 Breast MRI    Left breast UOQ 230 position: 2 x 1.9 x 1.9 cm mass, second mass left breast UOQ 230 position: 1 x 1 x 0.7 cm, 2 more enhancing adjacent 3 and 5 mm nodules slightly superiorly midway between the 2 masses.  No abnormal lymph nodes, right breast normal    06/11/2017 Surgery    Left Mastectomy: 2 foci of IDC 2.2 and 1 cm Grade 2, 1/2 LN Positive; Grade 2 IDC, ER 50%, PR 3%, HER-2 equivocal Ki-67 15% nipple area biopsy grade 2 IDC, ER 100% positive, PR negative, HER-2 equivocal, Ki-67 40% T2N1a Stage 1B    06/25/2017 Miscellaneous    Mammaprint high risk luminal type B    07/23/2017 - 11/10/2017 Chemotherapy    Adjuvant chemotherapy with dose dense Adriamycin and Cytoxan x4 followed by Taxol weekly x6     12/10/2017 Pathology Results    Repeat analysis of pathology confirmed that patient has heterogeneous HER-2 signaling.  There is equivocal IHC 2+ and fish negative areas where the ratio was 1.49 and copy number of 2.9.  There is one area that is HER-2 +3+ by IHC     CHIEF COMPLIANT: Follow-up to discuss the results of her-2 test being positive  INTERVAL HISTORY: Breanna [name] is a 72 year old with above-mentioned history of left breast cancer who underwent mastectomy and adjuvant chemotherapy with dose dense Adriamycin and  Cytoxan followed by Taxol.  I received a phone call from her radiation oncologist describing that initially the HER-2 result was equivocal but it was later updated to be HER-2 positive by IHC.  I requested the final pathology from the mastectomy to be tested for the HER-2 and it turns out that she has a heterogeneous breast cancer with areas that were HER-2 positive and HER-2 negative.  I asked her to come here so that we can discuss these issues and talk about anti-HER-2 therapy.  REVIEW OF SYSTEMS:   Constitutional: Denies fevers, chills or abnormal weight loss Eyes: Denies blurriness of vision Ears, nose, mouth, throat, and face: Denies mucositis or sore throat Respiratory: Chronic shortness of breath Cardiovascular: Denies palpitation, chest discomfort Gastrointestinal:  Denies nausea, heartburn or change in bowel habits Skin: Denies abnormal skin rashes Lymphatics: Denies new lymphadenopathy or easy bruising Neurological:Denies numbness, tingling or new weaknesses Behavioral/Psych: Mood is stable, no new changes  Extremities: No lower extremity edema  All other systems were reviewed with the patient and are negative.  I have reviewed the past medical history, past surgical history, social history and family history with the patient and they are unchanged from previous note.  ALLERGIES:  is allergic to atorvastatin; ancef [cefazolin]; vancomycin; lactose; nasonex [mometasone furoate]; and neurontin [gabapentin].  MEDICATIONS:  Current Outpatient Medications  Medication Sig Dispense Refill  . acetaminophen (TYLENOL) 500 MG tablet Take 500 mg by mouth 2 (  two) times daily as needed for moderate pain or headache.     Marland Kitchen amLODipine (NORVASC) 5 MG tablet Take 5 mg by mouth every evening.     . B Complex-C (SUPER B COMPLEX PO) Take 1 tablet by mouth daily.    . carvedilol (COREG) 25 MG tablet Take 25 mg by mouth 2 (two) times daily with a meal.    . cetirizine (ZYRTEC) 10 MG tablet Take 10 mg  by mouth daily.     . clopidogrel (PLAVIX) 75 MG tablet Take 75 mg by mouth every evening.     . diclofenac sodium (VOLTAREN) 1 % GEL Apply 1 application topically at bedtime.    . ferrous sulfate 325 (65 FE) MG tablet Take 325 mg by mouth daily with breakfast.    . fluticasone (FLONASE) 50 MCG/ACT nasal spray Place 1 spray into both nostrils daily as needed for allergies or rhinitis.    . furosemide (LASIX) 40 MG tablet Take 1 tablet (40 mg total) by mouth daily. 90 tablet 3  . lansoprazole (PREVACID) 15 MG capsule Take 15 mg by mouth daily.     Marland Kitchen PARoxetine (PAXIL) 10 MG tablet Take 10 mg by mouth every evening.    Vladimir Faster Glycol-Propyl Glycol (SYSTANE OP) Place 1 drop into both eyes daily as needed (for dry eyes).     . potassium chloride (K-DUR,KLOR-CON) 10 MEQ tablet Take 10 mEq by mouth every evening.     . traMADol (ULTRAM) 50 MG tablet Take 1 tablet (50 mg total) by mouth every 6 (six) hours as needed (mild pain). (Patient taking differently: Take 50 mg by mouth 2 (two) times daily as needed (mild pain). ) 60 tablet 3   No current facility-administered medications for this visit.     PHYSICAL EXAMINATION: ECOG PERFORMANCE STATUS: 1 - Symptomatic but completely ambulatory  There were no vitals filed for this visit. There were no vitals filed for this visit.  GENERAL:alert, no distress and comfortable SKIN: skin color, texture, turgor are normal, no rashes or significant lesions EYES: normal, Conjunctiva are pink and non-injected, sclera clear OROPHARYNX:no exudate, no erythema and lips, buccal mucosa, and tongue normal  NECK: supple, thyroid normal size, non-tender, without nodularity LYMPH:  no palpable lymphadenopathy in the cervical, axillary or inguinal LUNGS: Decreased breath sounds at the bases HEART: regular rate & rhythm and no murmurs and no lower extremity edema ABDOMEN:abdomen soft, non-tender and normal bowel sounds MUSCULOSKELETAL:no cyanosis of digits and no  clubbing  NEURO: alert & oriented x 3 with fluent speech, no focal motor/sensory deficits EXTREMITIES: No lower extremity edema  LABORATORY DATA:  I have reviewed the data as listed CMP Latest Ref Rng & Units 11/17/2017 11/10/2017 11/04/2017  Glucose 70 - 99 mg/dL 136(H) 146(H) 133(H)  BUN 8 - 23 mg/dL '19 15 12  '$ Creatinine 0.44 - 1.00 mg/dL 0.75 0.68 0.71  Sodium 135 - 145 mmol/L 143 143 142  Potassium 3.5 - 5.1 mmol/L 3.9 3.6 3.7  Chloride 98 - 111 mmol/L 104 104 103  CO2 22 - 32 mmol/L 30 32 32  Calcium 8.9 - 10.3 mg/dL 9.2 9.0 9.0  Total Protein 6.5 - 8.1 g/dL 6.0(L) 5.8(L) 5.9(L)  Total Bilirubin 0.3 - 1.2 mg/dL 0.3 0.3 0.2(L)  Alkaline Phos 38 - 126 U/L 86 88 90  AST 15 - 41 U/L '22 20 30  '$ ALT 0 - 44 U/L 31 32 29    Lab Results  Component Value Date   WBC 15.6 (H)  11/17/2017   HGB 9.6 (L) 11/17/2017   HCT 29.8 (L) 11/17/2017   MCV 99.7 11/17/2017   PLT 260 11/17/2017   NEUTROABS 13.6 (H) 11/17/2017    ASSESSMENT & PLAN:  Malignant neoplasm of upper-outer quadrant of left breast in female, estrogen receptor positive ([place]) 05/13/17: [place] biopsy: Left breast biopsy 2 o'clock position 8 cm from nipple ultrasound-guided biopsy: Grade 2 IDC, ER 50%, PR 3%, HER-2 equivocal Ki-67 15% nipple area biopsy grade 2 IDC, ER 100% positive, PR negative, HER-2 equivocal, Ki-67 40%  06/11/17:Left Mastectomy: 2 foci of IDC 2.2 and 1 cm Grade 2, 1/2 LN Positive; Grade 2 IDC, ER 50%, PR 3%, HER-2 equivocal Ki-67 15% nipple area biopsy grade 2 IDC, ER 100% positive, PR negative, HER-2 equivocal, Ki-67 40% T2N1a Stage 1B I discussed with her that given the fact that she has lymph node positive disease, this is clinically high risk disease.  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, potential benefit of treatment at 5 years: 94.6% distant metastasis free interval for patients treated with chemotherapy  Recommendation: 1.Adjuvant chemotherapy with dose dense Adriamycin and Cytoxan x4  followed by Taxol weekly x6 (stopped for neuropathy) 07/23/2017 to 11/10/2017 2.followed by radiation to be done in [place] 3.Followed by adjuvant antiestrogen therapy with letrozole 2.5 mg daily x5 to 7 years ----------------------------------------------------------------------- Pathology review: The initial evaluation of the patient's tumor was that it was HER-2 equivocal by IHC and fish.  However there is a focus of HER-2 positive disease.  I requested our pathology to repeat on the final specimen.  Repeat assessment revealed HER-2 negative focus but also HER-2 positive focus making her tumor heterogeneous.  I explained to the patient that we need to consider treatment with anti-HER-2 therapy in order to decrease her risk of recurrence.  Since it is a smaller tumor that is HER-2 positive, I recommended adjuvant Herceptin alone every 3 weeks for 1 year.  Herceptin counseling: We discussed the risks and benefits of Herceptin including the effect of Herceptin on cardiac ejection fraction.  We will be doing echocardiograms every 3 months for 1 year.  No orders of the defined types were placed in this encounter.  The patient has a good understanding of the overall plan. she agrees with it. she will call with any problems that may develop before the next visit here.   Harriette Ohara, MD 12/17/17

## 2017-12-17 NOTE — Assessment & Plan Note (Signed)
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, potential benefit of treatment at 5 years: 94.6% distant metastasis free interval for patients treated with chemotherapy  Recommendation: 1.Adjuvant chemotherapy with dose dense Adriamycin and Cytoxan x4 followed by Taxol weekly x6 (stopped for neuropathy) 07/23/2017 to 11/10/2017 2.followed by radiation to be done in Breanna [name] 3.Followed by adjuvant antiestrogen therapy with letrozole 2.5 mg daily x5 to 7 years ----------------------------------------------------------------------- Pathology review: The initial evaluation of the patient's tumor was that it was HER-2 equivocal by IHC and fish.  However there is a focus of HER-2 positive disease.  I requested our pathology to repeat on the final specimen.  Repeat assessment revealed HER-2 negative focus but also HER-2 positive focus making her tumor heterogeneous.  I explained to the patient that we need to consider treatment with anti-HER-2 therapy in order to decrease her risk of recurrence.  Since it is a smaller tumor that is HER-2 positive, I recommended adjuvant Herceptin alone every 3 weeks for 1 year.  Herceptin counseling: We discussed the risks and benefits of Herceptin including the effect of Herceptin on cardiac ejection fraction.  We will be doing echocardiograms every 3 months for 1  year.

## 2017-12-17 NOTE — Progress Notes (Signed)
DISCONTINUE ON PATHWAY REGIMEN - Breast   Dose-Dense AC q14 days:   A cycle is every 14 days:     Doxorubicin      Cyclophosphamide      Pegfilgrastim-xxxx   **Always confirm dose/schedule in your pharmacy ordering system**  Paclitaxel 80 mg/m2 Weekly:   Administer weekly:     Paclitaxel   **Always confirm dose/schedule in your pharmacy ordering system**  REASON: Other Reason PRIOR TREATMENT: [id number]: Dose-Dense AC-T (Paclitaxel Weekly) - [Doxorubicin + Cyclophosphamide q14 Days x 4 Cycles, Followed by Paclitaxel 80 mg/m2 Weekly x 12 Weeks] TREATMENT RESPONSE: N/A - Adjuvant Therapy  START OFF PATHWAY REGIMEN - Breast   [id number]:Trastuzumab (Maintenance - w/Loading Dose) x 11 Cycles:   A cycle is every 21 days:     Trastuzumab-xxxx      Trastuzumab-xxxx   **Always confirm dose/schedule in your pharmacy ordering system**  Patient Characteristics: Postoperative without Neoadjuvant Therapy (Pathologic Staging), Invasive Disease, Adjuvant Therapy, HER2 Positive, ER Positive, Node Positive Therapeutic Status: Postoperative without Neoadjuvant Therapy (Pathologic Staging) AJCC Grade: G2 AJCC N Category: pN1 AJCC M Category: cM0 ER Status: Positive (+) AJCC 8 Stage Grouping: IIB HER2 Status: Positive (+) Oncotype Dx Recurrence Score: Not Appropriate AJCC T Category: pT2 PR Status: Negative (-) Intent of Therapy: Curative Intent, Discussed with Patient

## 2017-12-20 ENCOUNTER — Encounter: Payer: Self-pay | Admitting: Hematology and Oncology

## 2017-12-20 ENCOUNTER — Other Ambulatory Visit: Payer: Self-pay

## 2017-12-20 ENCOUNTER — Emergency Department (HOSPITAL_COMMUNITY)
Admission: EM | Admit: 2017-12-20 | Discharge: 2017-12-20 | Disposition: A | Discharge status: Home / Self Care | Payer: Medicare Other | Attending: Emergency Medicine | Admitting: Emergency Medicine

## 2017-12-20 ENCOUNTER — Encounter (HOSPITAL_COMMUNITY): Payer: Self-pay | Admitting: Emergency Medicine

## 2017-12-20 DIAGNOSIS — J449 Chronic obstructive pulmonary disease, unspecified: Secondary | ICD-10-CM | POA: Diagnosis not present

## 2017-12-20 DIAGNOSIS — Z87891 Personal history of nicotine dependence: Secondary | ICD-10-CM | POA: Diagnosis not present

## 2017-12-20 DIAGNOSIS — Z79899 Other long term (current) drug therapy: Secondary | ICD-10-CM | POA: Diagnosis not present

## 2017-12-20 DIAGNOSIS — R21 Rash and other nonspecific skin eruption: Secondary | ICD-10-CM | POA: Insufficient documentation

## 2017-12-20 DIAGNOSIS — I10 Essential (primary) hypertension: Secondary | ICD-10-CM | POA: Diagnosis not present

## 2017-12-20 LAB — COMPREHENSIVE METABOLIC PANEL
ALT: 28 U/L (ref 0–44)
AST: 33 U/L (ref 15–41)
Albumin: 3.8 g/dL (ref 3.5–5.0)
Alkaline Phosphatase: 75 U/L (ref 38–126)
Anion gap: 7 (ref 5–15)
BILIRUBIN TOTAL: 0.4 mg/dL (ref 0.3–1.2)
BUN: 12 mg/dL (ref 8–23)
CO2: 32 mmol/L (ref 22–32)
Calcium: 8.8 mg/dL — ABNORMAL LOW (ref 8.9–10.3)
Chloride: 102 mmol/L (ref 98–111)
Creatinine, Ser: 0.7 mg/dL (ref 0.44–1.00)
GFR calc Af Amer: >60 mL/min (ref >60)
GFR calc non Af Amer: >60 mL/min (ref >60)
Glucose, Bld: 113 mg/dL — ABNORMAL HIGH (ref 70–99)
POTASSIUM: 3.2 mmol/L — AB (ref 3.5–5.1)
Sodium: 141 mmol/L (ref 135–145)
TOTAL PROTEIN: 6.8 g/dL (ref 6.5–8.1)

## 2017-12-20 LAB — CBC WITH DIFFERENTIAL/PLATELET
Abs Immature Granulocytes: 0.01 10*3/uL (ref 0.00–0.07)
Basophils Absolute: 0 10*3/uL (ref 0.0–0.1)
Basophils Relative: 1 %
EOS ABS: 0.2 10*3/uL (ref 0.0–0.5)
EOS PCT: 4 %
HEMATOCRIT: 37.1 % (ref 36.0–46.0)
Hemoglobin: 11.6 g/dL — ABNORMAL LOW (ref 12.0–15.0)
IMMATURE GRANULOCYTES: 0 %
LYMPHS ABS: 1.5 10*3/uL (ref 0.7–4.0)
Lymphocytes Relative: 29 %
MCH: 31.1 pg (ref 26.0–34.0)
MCHC: 31.3 g/dL (ref 30.0–36.0)
MCV: 99.5 fL (ref 80.0–100.0)
MONO ABS: 0.7 10*3/uL (ref 0.1–1.0)
MONOS PCT: 13 %
Neutro Abs: 2.8 10*3/uL (ref 1.7–7.7)
Neutrophils Relative %: 53 %
Platelets: 183 10*3/uL (ref 150–400)
RBC: 3.73 MIL/uL — ABNORMAL LOW (ref 3.87–5.11)
RDW: 13.9 % (ref 11.5–15.5)
WBC: 5.2 10*3/uL (ref 4.0–10.5)
nRBC: 0 % (ref 0.0–0.2)

## 2017-12-20 MED ORDER — SULFAMETHOXAZOLE-TRIMETHOPRIM 800-160 MG PO TABS: 1.0000 | ORAL_TABLET | Freq: Two times a day (BID) | ORAL | 0 refills | Status: AC

## 2017-12-20 NOTE — ED Triage Notes (Signed)
Pt c/o redness to rle started today. Redness and warmth noted to right lower leg. Just finished chemo treatments and prednisone.

## 2017-12-20 NOTE — Discharge Instructions (Addendum)
Contact your doctor this week for follow-up
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
## 2017-12-21 ENCOUNTER — Other Ambulatory Visit: Payer: Self-pay | Admitting: Surgery

## 2017-12-24 ENCOUNTER — Other Ambulatory Visit: Payer: Self-pay

## 2017-12-24 ENCOUNTER — Telehealth: Payer: Self-pay | Admitting: Oncology

## 2017-12-24 ENCOUNTER — Encounter (HOSPITAL_COMMUNITY): Payer: Self-pay | Admitting: *Deleted

## 2017-12-24 NOTE — Telephone Encounter (Signed)
Appts scheduled patient notified per 10/18 sch msg °

## 2017-12-24 NOTE — Progress Notes (Signed)
Pt denies SOB, chest pain, and being under the care of a cardiologist. Pt denies having a cardiac cath but stated that a stress test was performed at [facility] in [place], [place]; records requested. Pt stated that she does not take Aspirin. Left a voice message with Hassan Rowan. Surgical Coordinator to make MD aware that pt was not provided with pre-op instructions regarding Plavix. ( pt stated that she was instructed to stop it 5 days prior to last procedure); awaiting a return call. Pt denies having a chest x ray within the last year. Pt made aware to stop taking vitamins, fish oil and herbal medications. Do not take any NSAIDs ie: Ibuprofen, Advil, Naproxen (Aleve), Motrin, Voltaren, BC and Goody Powder. Pt verbalized understanding of all pre-op instructions.

## 2017-12-26 ENCOUNTER — Encounter (HOSPITAL_COMMUNITY): Payer: Self-pay | Admitting: Anesthesiology

## 2017-12-26 NOTE — Anesthesia Preprocedure Evaluation (Addendum)
Anesthesia Evaluation  Patient identified by MRN, date of birth, ID band Patient awake    Reviewed: Allergy & Precautions, NPO status , Patient's Chart, lab work & pertinent test results, reviewed documented beta blocker date and time   Airway Mallampati: II  TM Distance: >3 FB Neck ROM: Full    Dental no notable dental hx. (+) Teeth Intact, Dental Advisory Given   Pulmonary shortness of breath, pneumonia, resolved, COPD,  COPD inhaler and oxygen dependent, former smoker,    Pulmonary exam normal breath sounds clear to auscultation       Cardiovascular hypertension, negative cardio ROS Normal cardiovascular exam Rhythm:Regular Rate:Normal  Echo 10/03/2017- Left ventricle: The cavity size was normal. Wall thickness was increased in a pattern of mild LVH. There was mild focal basal hypertrophy of the septum. Systolic function was normal. The estimated ejection fraction was in the range of 55% to 60%. Wall motion was normal; there were no regional wall motion abnormalities. Features are consistent with a pseudonormal left ventricular filling pattern, with concomitant abnormal relaxation and increased filling pressure (grade 2 diastolic dysfunction). - Mitral valve: Calcified annulus. Mildly thickened leaflets .   There was mild systolic anterior motion of the chordal   structures.  Impressions:  - Normal LV systolic function; moderate diastolic dysfunction; mild LVH with proximal septal thickening; chordal SAM; no LVOT gradient; global longitudinal strain -23.2%.  EKG 12/01/2017-Normal sinus rhythm Septal infarct , age undetermined   Neuro/Psych Anxiety Peripheral neuropathy TIA Neuromuscular disease    GI/Hepatic Neg liver ROS, hiatal hernia, GERD  Medicated and Controlled,  Endo/Other  Breast Ca  Renal/GU negative Renal ROS  negative genitourinary   Musculoskeletal  (+) Arthritis , DDD Lumbar spine   Abdominal (+) +  obese,   Peds  Hematology  (+) anemia , Plavix therapy- last dose   Anesthesia Other Findings Day of surgery medications reviewed with the patient.  Reproductive/Obstetrics                           Anesthesia Physical Anesthesia Plan  ASA: III  Anesthesia Plan: General   Post-op Pain Management:    Induction: Intravenous  PONV Risk Score and Plan: 3 and Ondansetron, Dexamethasone and Treatment may vary due to age or medical condition  Airway Management Planned: LMA  Additional Equipment:   Intra-op Plan:   Post-operative Plan: Extubation in OR  Informed Consent: I have reviewed the patients History and Physical, chart, labs and discussed the procedure including the risks, benefits and alternatives for the proposed anesthesia with the patient or authorized representative who has indicated his/her understanding and acceptance.   Dental advisory given  Plan Discussed with: CRNA and Surgeon  Anesthesia Plan Comments:        Anesthesia Quick Evaluation
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
## 2017-12-27 ENCOUNTER — Ambulatory Visit (HOSPITAL_COMMUNITY): Payer: Medicare Other

## 2017-12-27 ENCOUNTER — Ambulatory Visit (HOSPITAL_COMMUNITY)
Admission: RE | Admit: 2017-12-27 | Discharge: 2017-12-27 | Disposition: A | Discharge status: Home / Self Care | Payer: Medicare Other | Source: Ambulatory Visit | Attending: Surgery | Admitting: Surgery

## 2017-12-27 ENCOUNTER — Telehealth: Payer: Self-pay | Admitting: *Deleted

## 2017-12-27 ENCOUNTER — Ambulatory Visit (HOSPITAL_COMMUNITY): Payer: Medicare Other | Admitting: Certified Registered Nurse Anesthetist

## 2017-12-27 ENCOUNTER — Encounter (HOSPITAL_COMMUNITY): Payer: Self-pay | Admitting: Surgery

## 2017-12-27 ENCOUNTER — Encounter (HOSPITAL_COMMUNITY)
Admission: RE | Disposition: A | Discharge status: Home / Self Care | Payer: Self-pay | Source: Ambulatory Visit | Attending: Surgery

## 2017-12-27 ENCOUNTER — Other Ambulatory Visit: Payer: Self-pay

## 2017-12-27 DIAGNOSIS — J449 Chronic obstructive pulmonary disease, unspecified: Secondary | ICD-10-CM | POA: Diagnosis not present

## 2017-12-27 DIAGNOSIS — Z9012 Acquired absence of left breast and nipple: Secondary | ICD-10-CM | POA: Insufficient documentation

## 2017-12-27 DIAGNOSIS — Z95828 Presence of other vascular implants and grafts: Secondary | ICD-10-CM

## 2017-12-27 DIAGNOSIS — Z87891 Personal history of nicotine dependence: Secondary | ICD-10-CM | POA: Insufficient documentation

## 2017-12-27 DIAGNOSIS — Z170 Estrogen receptor positive status [ER+]: Secondary | ICD-10-CM | POA: Insufficient documentation

## 2017-12-27 DIAGNOSIS — Z96652 Presence of left artificial knee joint: Secondary | ICD-10-CM | POA: Insufficient documentation

## 2017-12-27 DIAGNOSIS — C50912 Malignant neoplasm of unspecified site of left female breast: Secondary | ICD-10-CM | POA: Diagnosis present

## 2017-12-27 DIAGNOSIS — Z9981 Dependence on supplemental oxygen: Secondary | ICD-10-CM | POA: Diagnosis not present

## 2017-12-27 DIAGNOSIS — I10 Essential (primary) hypertension: Secondary | ICD-10-CM | POA: Insufficient documentation

## 2017-12-27 DIAGNOSIS — K219 Gastro-esophageal reflux disease without esophagitis: Secondary | ICD-10-CM | POA: Insufficient documentation

## 2017-12-27 DIAGNOSIS — Z419 Encounter for procedure for purposes other than remedying health state, unspecified: Secondary | ICD-10-CM

## 2017-12-27 DIAGNOSIS — Z8673 Personal history of transient ischemic attack (TIA), and cerebral infarction without residual deficits: Secondary | ICD-10-CM | POA: Diagnosis not present

## 2017-12-27 HISTORY — PX: PORTACATH PLACEMENT: SHX2246

## 2017-12-27 HISTORY — DX: Presence of spectacles and contact lenses: Z97.3

## 2017-12-27 SURGERY — INSERTION, TUNNELED CENTRAL VENOUS DEVICE, WITH PORT
Anesthesia: General | Site: Neck | Laterality: Right

## 2017-12-27 MED ORDER — BUPIVACAINE HCL (PF) 0.25 % IJ SOLN
INTRAMUSCULAR | Status: DC | PRN
  Administered 2017-12-27: 10 mL

## 2017-12-27 MED ORDER — CHLORHEXIDINE GLUCONATE CLOTH 2 % EX PADS: 6.0000 | MEDICATED_PAD | Freq: Once | CUTANEOUS | Status: DC

## 2017-12-27 MED ORDER — LACTATED RINGERS IV SOLN
INTRAVENOUS | Status: DC | PRN
  Administered 2017-12-27: 07:00:00 via INTRAVENOUS

## 2017-12-27 MED ORDER — ACETAMINOPHEN 500 MG PO TABS
ORAL_TABLET | ORAL | Status: AC
  Administered 2017-12-27: 1000 mg via ORAL
  Filled 2017-12-27: qty 2

## 2017-12-27 MED ORDER — FENTANYL CITRATE (PF) 100 MCG/2ML IJ SOLN
INTRAMUSCULAR | Status: DC | PRN
  Administered 2017-12-27 (×2): 25 ug via INTRAVENOUS

## 2017-12-27 MED ORDER — LIDOCAINE 2% (20 MG/ML) 5 ML SYRINGE
INTRAMUSCULAR | Status: DC | PRN
  Administered 2017-12-27: 40 mg via INTRAVENOUS

## 2017-12-27 MED ORDER — EPHEDRINE 5 MG/ML INJ
INTRAVENOUS | Status: AC
  Filled 2017-12-27: qty 10

## 2017-12-27 MED ORDER — HEPARIN SOD (PORK) LOCK FLUSH 100 UNIT/ML IV SOLN
INTRAVENOUS | Status: AC
  Filled 2017-12-27: qty 5

## 2017-12-27 MED ORDER — FENTANYL CITRATE (PF) 250 MCG/5ML IJ SOLN
INTRAMUSCULAR | Status: AC
  Filled 2017-12-27: qty 5

## 2017-12-27 MED ORDER — ONDANSETRON HCL 4 MG/2ML IJ SOLN
INTRAMUSCULAR | Status: AC
  Filled 2017-12-27: qty 2

## 2017-12-27 MED ORDER — TRAMADOL HCL 50 MG PO TABS: 50.0000 mg | ORAL_TABLET | Freq: Two times a day (BID) | ORAL | 0 refills | Status: DC | PRN

## 2017-12-27 MED ORDER — EPHEDRINE SULFATE-NACL 50-0.9 MG/10ML-% IV SOSY
PREFILLED_SYRINGE | INTRAVENOUS | Status: DC | PRN
  Administered 2017-12-27: 5 mg via INTRAVENOUS

## 2017-12-27 MED ORDER — FENTANYL CITRATE (PF) 100 MCG/2ML IJ SOLN: 25.0000 ug | INTRAMUSCULAR | Status: DC | PRN

## 2017-12-27 MED ORDER — LIDOCAINE 2% (20 MG/ML) 5 ML SYRINGE
INTRAMUSCULAR | Status: AC
  Filled 2017-12-27: qty 5

## 2017-12-27 MED ORDER — MEPERIDINE HCL 50 MG/ML IJ SOLN: 6.2500 mg | INTRAMUSCULAR | Status: DC | PRN

## 2017-12-27 MED ORDER — SODIUM CHLORIDE 0.9 % IV SOLN
INTRAVENOUS | Status: DC | PRN
  Administered 2017-12-27: 500 mL

## 2017-12-27 MED ORDER — HEPARIN SOD (PORK) LOCK FLUSH 100 UNIT/ML IV SOLN
INTRAVENOUS | Status: DC | PRN
  Administered 2017-12-27: 500 [IU]

## 2017-12-27 MED ORDER — PROPOFOL 10 MG/ML IV BOLUS
INTRAVENOUS | Status: AC
  Filled 2017-12-27: qty 20

## 2017-12-27 MED ORDER — BUPIVACAINE HCL (PF) 0.25 % IJ SOLN
INTRAMUSCULAR | Status: AC
  Filled 2017-12-27: qty 30

## 2017-12-27 MED ORDER — SODIUM CHLORIDE 0.9 % IV SOLN
INTRAVENOUS | Status: AC
  Filled 2017-12-27: qty 1.2

## 2017-12-27 MED ORDER — DEXAMETHASONE SODIUM PHOSPHATE 10 MG/ML IJ SOLN
INTRAMUSCULAR | Status: DC | PRN
  Administered 2017-12-27: 10 mg via INTRAVENOUS

## 2017-12-27 MED ORDER — CIPROFLOXACIN IN D5W 400 MG/200ML IV SOLN
INTRAVENOUS | Status: AC
  Filled 2017-12-27: qty 200

## 2017-12-27 MED ORDER — DEXAMETHASONE SODIUM PHOSPHATE 10 MG/ML IJ SOLN
INTRAMUSCULAR | Status: AC
  Filled 2017-12-27: qty 2

## 2017-12-27 MED ORDER — CIPROFLOXACIN IN D5W 400 MG/200ML IV SOLN
400.0000 mg | INTRAVENOUS | Status: AC
  Administered 2017-12-27: 400 mg via INTRAVENOUS

## 2017-12-27 MED ORDER — ACETAMINOPHEN 500 MG PO TABS
1000.0000 mg | ORAL_TABLET | ORAL | Status: AC
  Administered 2017-12-27: 1000 mg via ORAL

## 2017-12-27 MED ORDER — ONDANSETRON HCL 4 MG/2ML IJ SOLN
INTRAMUSCULAR | Status: DC | PRN
  Administered 2017-12-27: 4 mg via INTRAVENOUS

## 2017-12-27 MED ORDER — HYDROCODONE-ACETAMINOPHEN 7.5-325 MG PO TABS: 1.0000 | ORAL_TABLET | Freq: Once | ORAL | Status: DC | PRN

## 2017-12-27 MED ORDER — PROPOFOL 10 MG/ML IV BOLUS
INTRAVENOUS | Status: DC | PRN
  Administered 2017-12-27: 130 mg via INTRAVENOUS

## 2017-12-27 MED ORDER — 0.9 % SODIUM CHLORIDE (POUR BTL) OPTIME
TOPICAL | Status: DC | PRN
  Administered 2017-12-27: 1000 mL

## 2017-12-27 SURGICAL SUPPLY — 43 items
BAG DECANTER FOR FLEXI CONT (MISCELLANEOUS) ×3 IMPLANT
CHLORAPREP W/TINT 26ML (MISCELLANEOUS) ×3 IMPLANT
COVER SURGICAL LIGHT HANDLE (MISCELLANEOUS) ×3 IMPLANT
COVER TRANSDUCER ULTRASND GEL (DRAPE) ×3 IMPLANT
COVER WAND RF STERILE (DRAPES) ×3 IMPLANT
CRADLE DONUT ADULT HEAD (MISCELLANEOUS) ×3 IMPLANT
DERMABOND ADVANCED (GAUZE/BANDAGES/DRESSINGS) ×2
DERMABOND ADVANCED .7 DNX12 (GAUZE/BANDAGES/DRESSINGS) ×1 IMPLANT
DRAPE C-ARM 42X72 X-RAY (DRAPES) ×3 IMPLANT
DRAPE CHEST BREAST 15X10 FENES (DRAPES) ×3 IMPLANT
DRSG TEGADERM 4X4.75 (GAUZE/BANDAGES/DRESSINGS) ×3 IMPLANT
ELECT CAUTERY BLADE 6.4 (BLADE) ×3 IMPLANT
ELECT REM PT RETURN 9FT ADLT (ELECTROSURGICAL) ×3
ELECTRODE REM PT RTRN 9FT ADLT (ELECTROSURGICAL) ×1 IMPLANT
GAUZE 4X4 16PLY RFD (DISPOSABLE) ×3 IMPLANT
GAUZE SPONGE 2X2 8PLY STRL LF (GAUZE/BANDAGES/DRESSINGS) ×1 IMPLANT
GEL ULTRASOUND 20GR AQUASONIC (MISCELLANEOUS) IMPLANT
GLOVE SURG SIGNA 7.5 PF LTX (GLOVE) ×3 IMPLANT
GOWN STRL REUS W/ TWL LRG LVL3 (GOWN DISPOSABLE) ×1 IMPLANT
GOWN STRL REUS W/ TWL XL LVL3 (GOWN DISPOSABLE) ×1 IMPLANT
GOWN STRL REUS W/TWL LRG LVL3 (GOWN DISPOSABLE) ×2
GOWN STRL REUS W/TWL XL LVL3 (GOWN DISPOSABLE) ×2
INTRODUCER COOK 11FR (CATHETERS) IMPLANT
KIT BASIN OR (CUSTOM PROCEDURE TRAY) ×3 IMPLANT
KIT PORT POWER 8FR ISP CVUE (Port) ×3 IMPLANT
KIT TURNOVER KIT B (KITS) ×3 IMPLANT
NS IRRIG 1000ML POUR BTL (IV SOLUTION) ×3 IMPLANT
PAD ARMBOARD 7.5X6 YLW CONV (MISCELLANEOUS) ×3 IMPLANT
PENCIL BUTTON HOLSTER BLD 10FT (ELECTRODE) ×3 IMPLANT
SET INTRODUCER 12FR PACEMAKER (INTRODUCER) IMPLANT
SET SHEATH INTRODUCER 10FR (MISCELLANEOUS) IMPLANT
SHEATH COOK PEEL AWAY SET 9F (SHEATH) IMPLANT
SPONGE GAUZE 2X2 STER 10/PKG (GAUZE/BANDAGES/DRESSINGS) ×2
SUT MNCRL AB 4-0 PS2 18 (SUTURE) ×3 IMPLANT
SUT PROLENE 2 0 SH 30 (SUTURE) ×3 IMPLANT
SUT SILK 2 0 (SUTURE)
SUT SILK 2-0 18XBRD TIE 12 (SUTURE) IMPLANT
SUT VIC AB 3-0 SH 27 (SUTURE) ×2
SUT VIC AB 3-0 SH 27XBRD (SUTURE) ×1 IMPLANT
SYR 5ML LUER SLIP (SYRINGE) ×3 IMPLANT
TOWEL OR 17X24 6PK STRL BLUE (TOWEL DISPOSABLE) ×3 IMPLANT
TOWEL OR 17X26 10 PK STRL BLUE (TOWEL DISPOSABLE) ×3 IMPLANT
TRAY LAPAROSCOPIC MC (CUSTOM PROCEDURE TRAY) ×3 IMPLANT

## 2017-12-27 NOTE — Transfer of Care (Signed)
Immediate Anesthesia Transfer of Care Note  Patient: Breanna [name]  Procedure(s) Performed: INSERTION PORT-A-CATH (Right Neck)  Patient Location: PACU  Anesthesia Type:General  Level of Consciousness: drowsy and patient cooperative  Airway & Oxygen Therapy: Patient Spontanous Breathing and Patient connected to nasal cannula oxygen  Post-op Assessment: Report given to RN and Post -op Vital signs reviewed and stable  Post vital signs: Reviewed and stable  Last Vitals:  Vitals Value Taken Time  BP 125/47 12/27/2017  8:21 AM  Temp 36.2 C 12/27/2017  8:21 AM  Pulse 66 12/27/2017  8:23 AM  Resp 13 12/27/2017  8:23 AM  SpO2 96 % 12/27/2017  8:23 AM  Vitals shown include unvalidated device data.  Last Pain:  Vitals:   12/27/17 0630  TempSrc:   PainSc: 0-No pain      Patients Stated Pain Goal: 4 (29/92/42 6834)  Complications: No apparent anesthesia complications

## 2017-12-27 NOTE — Discharge Instructions (Signed)
    PORT-A-CATH: POST OP INSTRUCTIONS  Always review your discharge instruction sheet given to you by the facility where your surgery was performed.   1. A prescription for pain medication may be given to you upon discharge. Take your pain medication as prescribed, if needed. If narcotic pain medicine is not needed, then you make take acetaminophen (Tylenol) or ibuprofen (Advil) as needed.  2. Take your usually prescribed medications unless otherwise directed. 3. If you need a refill on your pain medication, please contact our office. All narcotic pain medicine now requires a paper prescription.  Phoned in and fax refills are no longer allowed by law.  Prescriptions will not be filled after 5 pm or on weekends.  4. You should follow a light diet for the remainder of the day after your procedure. 5. Most patients will experience some mild swelling and/or bruising in the area of the incision. It may take several days to resolve. 6. It is common to experience some constipation if taking pain medication after surgery. Increasing fluid intake and taking a stool softener (such as Colace) will usually help or prevent this problem from occurring. A mild laxative (Milk of Magnesia or Miralax) should be taken according to package directions if there are no bowel movements after 48 hours.  7. Unless discharge instructions indicate otherwise, you may remove your bandages 48 hours after surgery, and you may shower at that time. You may have steri-strips (small white skin tapes) in place directly over the incision.  These strips should be left on the skin for 7-10 days.  If your surgeon used Dermabond (skin glue) on the incision, you may shower in 24 hours.  The glue will flake off over the next 2-3 weeks.  8. If your port is left accessed at the end of surgery (needle left in port), the dressing cannot get wet and should only by changed by a healthcare professional. When the port is no longer accessed (when the  needle has been removed), follow step 7.   9. ACTIVITIES:  Limit activity involving your arms for the next 72 hours. Do no strenuous exercise or activity for 1 week. You may drive when you are no longer taking prescription pain medication, you can comfortably wear a seatbelt, and you can maneuver your car. 10.You may need to see your doctor in the office for a follow-up appointment.  Please       check with your doctor.  11.When you receive a new Port-a-Cath, you will get a product guide and        ID card.  Please keep them in case you need them.  WHEN TO CALL YOUR DOCTOR ([phone]): 1. Fever over 101.0 2. Chills 3. Continued bleeding from incision 4. Increased redness and tenderness at the site 5. Shortness of breath, difficulty breathing   The clinic staff is available to answer your questions during regular business hours. Please don't hesitate to call and ask to speak to one of the nurses or medical assistants for clinical concerns. If you have a medical emergency, go to the nearest emergency room or call 911.  A surgeon from [facility] is always on call at the hospital.     For further information, please visit [email or web address]      

## 2017-12-27 NOTE — Anesthesia Procedure Notes (Signed)
Procedure Name: LMA Insertion Date/Time: 12/27/2017 7:35 AM Performed by: Colin Benton, CRNA Pre-anesthesia Checklist: Patient identified, Emergency Drugs available, Suction available and Patient being monitored Patient Re-evaluated:Patient Re-evaluated prior to induction Oxygen Delivery Method: Circle system utilized Preoxygenation: Pre-oxygenation with 100% oxygen Induction Type: IV induction Ventilation: Mask ventilation without difficulty LMA: LMA inserted LMA Size: 4.0 Number of attempts: 1 Placement Confirmation: positive ETCO2 and breath sounds checked- equal and bilateral Tube secured with: Tape Dental Injury: Teeth and Oropharynx as per pre-operative assessment

## 2017-12-27 NOTE — Op Note (Signed)
INSERTION PORT-[name]-CATH  Procedure Note  Breanna [name] 12/27/2017   Pre-op Diagnosis: BREAST CANCER     Post-op Diagnosis: same  Procedure(s): RIGHT SUBCLAVIAN VEIN INSERTION PORT-[name]-CATH (49fR)  Surgeon(s): Coralie Keens, MD  Anesthesia: General  Staff:  Circulator: Nicki Reaper, RN Radiology Technologist: Marlan Palau Scrub Person: Nathen May, CST  Estimated Blood Loss: Minimal               Procedure:  The patient was brought to the operating room and identified as correct patient.  She is placed upon the operating table and general anesthesia was induced.  Her right neck and chest were then prepped and draped in the usual sterile fashion.  The patient was placed in the Trendelenburg position.  I anesthetized the skin of the right chest and clavicle with Marcaine.  I then used the introducer needle to easily cannulate the right subclavian vein.  [name] wire was passed through the needle into the central venous system under fluoroscopy.  I then anesthetized the skin around the wire further.  I made [name] transverse incision with [name] scalpel and then created [name] pocket for the port with the cautery.  An 8 French [place] port was brought to the field.  The port fit easily to the catheter.  Both the port and catheter were flushed.  I then placed the venous dilator introducer sheath over the wire and into the central venous system.  I then remove the wire and dilator.  I placed the port into the pocket after attaching the catheter.  I cut the catheter appropriate length.  I then fed down the peel-away sheath.  The sheath was then peeled away leaving the catheter and central venous system.  Placement in the superior vena cava appeared to be achieved on fluoroscopy.  I accessed the port and good flush and return were demonstrated.  I then placed several cc of concentrated heparin solution into the port.  The port was sewn to the chest wall 2 separate 3-0 Prolene sutures.  I then closed the  subcutaneous tissue with interrupted 3-0 Vicryl sutures and closed the skin with [name] running 4-0 Monocryl.  Dermabond was then applied.  The patient tolerated procedure well.  All the counts were correct at the end of the procedure.  The patient was then extubated in the operating room and taken in [name] stable condition to the recovery room.  Breanna [name]   Date: 12/27/2017  Time: 8:15 AM

## 2017-12-27 NOTE — Telephone Encounter (Signed)
Spoke with patient and confirmed her echo appointment for 10/23 at 11am at [facility].

## 2017-12-27 NOTE — Anesthesia Postprocedure Evaluation (Signed)
Anesthesia Post Note  Patient: Breanna [name]  Procedure(s) Performed: INSERTION PORT-A-CATH (Right Neck)     Patient location during evaluation: PACU Anesthesia Type: General Level of consciousness: awake and alert and oriented Pain management: pain level controlled Vital Signs Assessment: post-procedure vital signs reviewed and stable Respiratory status: spontaneous breathing, nonlabored ventilation and respiratory function stable Cardiovascular status: blood pressure returned to baseline and stable Postop Assessment: no apparent nausea or vomiting Anesthetic complications: no    Last Vitals:  Vitals:   12/27/17 0836 12/27/17 0851  BP: (!) 131/57 (!) 110/53  Pulse: 62 62  Resp: 11 12  Temp:    SpO2: 96% 97%    Last Pain:  Vitals:   12/27/17 0630  TempSrc:   PainSc: 0-No pain                 [name] A.

## 2017-12-27 NOTE — Interval H&P Note (Signed)
History and Physical Interval Note: no change in  H and P  12/27/2017 6:43 AM  Breanna [name]  has presented today for surgery, with the diagnosis of BREAST CANCER  The various methods of treatment have been discussed with the patient and family. After consideration of risks, benefits and other options for treatment, the patient has consented to  Procedure(s): INSERTION PORT-A-CATH (N/A) as a surgical intervention .  The patient's history has been reviewed, patient examined, no change in status, stable for surgery.  I have reviewed the patient's chart and labs.  Questions were answered to the patient's satisfaction.     Karandeep Resende A

## 2017-12-28 ENCOUNTER — Encounter (HOSPITAL_COMMUNITY): Payer: Self-pay | Admitting: Surgery

## 2017-12-28 ENCOUNTER — Ambulatory Visit (HOSPITAL_COMMUNITY)
Admission: RE | Admit: 2017-12-28 | Discharge: 2017-12-28 | Disposition: A | Discharge status: Home / Self Care | Payer: Medicare Other | Source: Ambulatory Visit | Attending: Hematology and Oncology | Admitting: Hematology and Oncology

## 2017-12-28 ENCOUNTER — Other Ambulatory Visit: Payer: Self-pay

## 2017-12-28 DIAGNOSIS — Z5111 Encounter for antineoplastic chemotherapy: Secondary | ICD-10-CM | POA: Diagnosis not present

## 2017-12-28 DIAGNOSIS — Z01810 Encounter for preprocedural cardiovascular examination: Secondary | ICD-10-CM | POA: Diagnosis not present

## 2017-12-28 DIAGNOSIS — I340 Nonrheumatic mitral (valve) insufficiency: Secondary | ICD-10-CM | POA: Diagnosis not present

## 2017-12-28 DIAGNOSIS — C50412 Malignant neoplasm of upper-outer quadrant of left female breast: Secondary | ICD-10-CM

## 2017-12-28 DIAGNOSIS — Z170 Estrogen receptor positive status [ER+]: Principal | ICD-10-CM

## 2017-12-28 NOTE — Progress Notes (Signed)
  Echocardiogram 2D Echocardiogram has been performed.  Breanna [name] 12/28/2017, 11:46 AM

## 2017-12-29 ENCOUNTER — Inpatient Hospital Stay: Payer: Medicare Other

## 2017-12-29 ENCOUNTER — Inpatient Hospital Stay (HOSPITAL_BASED_OUTPATIENT_CLINIC_OR_DEPARTMENT_OTHER): Payer: Medicare Other | Admitting: Hematology and Oncology

## 2017-12-29 DIAGNOSIS — Z170 Estrogen receptor positive status [ER+]: Secondary | ICD-10-CM

## 2017-12-29 DIAGNOSIS — Z79899 Other long term (current) drug therapy: Secondary | ICD-10-CM

## 2017-12-29 DIAGNOSIS — C50412 Malignant neoplasm of upper-outer quadrant of left female breast: Secondary | ICD-10-CM

## 2017-12-29 DIAGNOSIS — Z95828 Presence of other vascular implants and grafts: Secondary | ICD-10-CM

## 2017-12-29 DIAGNOSIS — Z9012 Acquired absence of left breast and nipple: Secondary | ICD-10-CM

## 2017-12-29 DIAGNOSIS — Z9221 Personal history of antineoplastic chemotherapy: Secondary | ICD-10-CM | POA: Diagnosis not present

## 2017-12-29 DIAGNOSIS — Z5112 Encounter for antineoplastic immunotherapy: Secondary | ICD-10-CM | POA: Diagnosis not present

## 2017-12-29 LAB — CMP (CANCER CENTER ONLY)
ALT: 25 U/L (ref 0–44)
AST: 24 U/L (ref 15–41)
Albumin: 3.3 g/dL — ABNORMAL LOW (ref 3.5–5.0)
Alkaline Phosphatase: 69 U/L (ref 38–126)
Anion gap: 7 (ref 5–15)
BUN: 11 mg/dL (ref 8–23)
CALCIUM: 8.9 mg/dL (ref 8.9–10.3)
CO2: 31 mmol/L (ref 22–32)
CREATININE: 0.68 mg/dL (ref 0.44–1.00)
Chloride: 106 mmol/L (ref 98–111)
Glucose, Bld: 139 mg/dL — ABNORMAL HIGH (ref 70–99)
Potassium: 3.7 mmol/L (ref 3.5–5.1)
Sodium: 144 mmol/L (ref 135–145)
TOTAL PROTEIN: 6 g/dL — AB (ref 6.5–8.1)

## 2017-12-29 LAB — CBC WITH DIFFERENTIAL (CANCER CENTER ONLY)
Abs Immature Granulocytes: 0.02 10*3/uL (ref 0.00–0.07)
Basophils Absolute: 0 10*3/uL (ref 0.0–0.1)
Basophils Relative: 1 %
EOS PCT: 1 %
Eosinophils Absolute: 0.1 10*3/uL (ref 0.0–0.5)
HEMATOCRIT: 34.2 % — AB (ref 36.0–46.0)
HEMOGLOBIN: 10.7 g/dL — AB (ref 12.0–15.0)
Immature Granulocytes: 0 %
LYMPHS ABS: 1.6 10*3/uL (ref 0.7–4.0)
LYMPHS PCT: 29 %
MCH: 31 pg (ref 26.0–34.0)
MCHC: 31.3 g/dL (ref 30.0–36.0)
MCV: 99.1 fL (ref 80.0–100.0)
MONO ABS: 0.7 10*3/uL (ref 0.1–1.0)
MONOS PCT: 12 %
NRBC: 0 % (ref 0.0–0.2)
Neutro Abs: 3.2 10*3/uL (ref 1.7–7.7)
Neutrophils Relative %: 57 %
Platelet Count: 160 10*3/uL (ref 150–400)
RBC: 3.45 MIL/uL — ABNORMAL LOW (ref 3.87–5.11)
RDW: 13.7 % (ref 11.5–15.5)
WBC: 5.6 10*3/uL (ref 4.0–10.5)

## 2017-12-29 MED ORDER — ACETAMINOPHEN 325 MG PO TABS
650.0000 mg | ORAL_TABLET | Freq: Once | ORAL | Status: AC
  Administered 2017-12-29: 650 mg via ORAL

## 2017-12-29 MED ORDER — HEPARIN SOD (PORK) LOCK FLUSH 100 UNIT/ML IV SOLN
500.0000 [IU] | Freq: Once | INTRAVENOUS | Status: AC | PRN
  Administered 2017-12-29: 500 [IU]
  Filled 2017-12-29: qty 5

## 2017-12-29 MED ORDER — DICLOFENAC SODIUM 1 % TD GEL: 4.0000 g | Freq: Every day | TRANSDERMAL | 6 refills | Status: DC

## 2017-12-29 MED ORDER — DIPHENHYDRAMINE HCL 25 MG PO CAPS: 50.0000 mg | ORAL_CAPSULE | Freq: Once | ORAL | Status: DC

## 2017-12-29 MED ORDER — SODIUM CHLORIDE 0.9% FLUSH
10.0000 mL | INTRAVENOUS | Status: DC | PRN
  Administered 2017-12-29: 10 mL
  Filled 2017-12-29: qty 10

## 2017-12-29 MED ORDER — TRASTUZUMAB CHEMO 150 MG IV SOLR
8.0000 mg/kg | Freq: Once | INTRAVENOUS | Status: AC
  Administered 2017-12-29: 798 mg via INTRAVENOUS
  Filled 2017-12-29: qty 38

## 2017-12-29 MED ORDER — ACETAMINOPHEN 325 MG PO TABS
ORAL_TABLET | ORAL | Status: AC
  Filled 2017-12-29: qty 2

## 2017-12-29 MED ORDER — BUMETANIDE 1 MG PO TABS: 1.0000 mg | ORAL_TABLET | Freq: Every day | ORAL | 3 refills | Status: DC

## 2017-12-29 MED ORDER — SODIUM CHLORIDE 0.9 % IV SOLN
Freq: Once | INTRAVENOUS | Status: AC
  Administered 2017-12-29: 12:00:00 via INTRAVENOUS
  Filled 2017-12-29: qty 250

## 2017-12-29 NOTE — Assessment & Plan Note (Signed)
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, potential benefit of treatment at 5 years: 94.6% distant metastasis free interval for patients treated with chemotherapy  Recommendation: 1.Adjuvant chemotherapy with dose dense Adriamycin and Cytoxan x4 followed by Taxol weekly x6 (stopped for neuropathy)07/23/2017 to 11/10/2017 Herceptin adjuvant treatment started 12/29/2017 2.followed by radiation to be done in [place] 3.Followed by adjuvant antiestrogen therapy with letrozole 2.5 mg daily x5 to 7 years ----------------------------------------------------------------------- Repeat assessment revealed HER-2 negative focus but also HER-2 positive focus making her tumor heterogeneous.  Current treatment: Herceptin today is first treatment Echocardiogram 12/26/2017: EF 60 to 65%  Patient will be starting radiation soon at [facility]. Return to clinic every 3 weeks for Herceptin Every 6 weeks for labs and MD visits and follow-up.

## 2017-12-29 NOTE — Progress Notes (Signed)
Patient Care Team: Sherrilee Gilles, DO as PCP - General (Family Medicine)  DIAGNOSIS:  Encounter Diagnosis  Name Primary?  . Malignant neoplasm of upper-outer quadrant of left breast in female, estrogen receptor positive ([place])     SUMMARY OF ONCOLOGIC HISTORY:   Malignant neoplasm of upper-outer quadrant of left breast in female, estrogen receptor positive ([place])   05/13/2017 Initial Diagnosis    [place] biopsy: Left breast biopsy 2 o'clock position 8 cm from nipple ultrasound-guided biopsy: Grade 2 IDC, ER 50%, PR 3%, HER-2 equivocal Ki-67 15% nipple area biopsy grade 2 IDC, ER 100% positive, PR negative, HER-2 equivocal, Ki-67 40%    05/31/2017 Breast MRI    Left breast UOQ 230 position: 2 x 1.9 x 1.9 cm mass, second mass left breast UOQ 230 position: 1 x 1 x 0.7 cm, 2 more enhancing adjacent 3 and 5 mm nodules slightly superiorly midway between the 2 masses.  No abnormal lymph nodes, right breast normal    06/11/2017 Surgery    Left Mastectomy: 2 foci of IDC 2.2 and 1 cm Grade 2, 1/2 LN Positive; Grade 2 IDC, ER 50%, PR 3%, HER-2 equivocal Ki-67 15% nipple area biopsy grade 2 IDC, ER 100% positive, PR negative, HER-2 equivocal, Ki-67 40% T2N1a Stage 1B    06/25/2017 Miscellaneous    Mammaprint high risk luminal type B    07/23/2017 - 11/10/2017 Chemotherapy    Adjuvant chemotherapy with dose dense Adriamycin and Cytoxan x4 followed by Taxol weekly x6     12/10/2017 Pathology Results    Repeat analysis of pathology confirmed that patient has heterogeneous HER-2 signaling.  There is equivocal IHC 2+ and fish negative areas where the ratio was 1.49 and copy number of 2.9.  There is one area that is HER-2 +3+ by [facility]    12/17/2017 -  Chemotherapy    The patient had trastuzumab (HERCEPTIN) 798 mg in sodium chloride 0.9 % 250 mL chemo infusion, 8 mg/kg = 798 mg, Intravenous,  Once, 1 of 18 cycles  for chemotherapy treatment.      CHIEF COMPLIANT: Cycle 1 Herceptin  INTERVAL  HISTORY: Breanna [name] is a 72 year old with above-mentioned history of breast cancer who finished adjuvant chemotherapy and later we realized that she was HER-2 positive and hence she is starting on adjuvant Herceptin.  Today is cycle 1 day 1.  Her echocardiogram yesterday was normal.  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  She is here to start adjuvant radiation therapy.  REVIEW OF SYSTEMS:   Constitutional: Denies fevers, chills or abnormal weight loss Eyes: Denies blurriness of vision Ears, nose, mouth, throat, and face: Denies mucositis or sore throat Respiratory: Denies cough, dyspnea or wheezes Cardiovascular: Denies palpitation, chest discomfort Gastrointestinal:  Denies nausea, heartburn or change in bowel habits Skin: Denies abnormal skin rashes Lymphatics: Denies new lymphadenopathy or easy bruising Neurological:Denies numbness, tingling or new weaknesses Behavioral/Psych: Mood is stable, no new changes  Extremities: No lower extremity edema   All other systems were reviewed with the patient and are negative.  I have reviewed the past medical history, past surgical history, social history and family history with the patient and they are unchanged from previous note.  ALLERGIES:  is allergic to atorvastatin; vancomycin; ancef [cefazolin]; lactose; nasonex [mometasone furoate]; and neurontin [gabapentin].  MEDICATIONS:  Current Outpatient Medications  Medication Sig Dispense Refill  . acetaminophen (TYLENOL) 500 MG tablet Take 500 mg by mouth  2 (two) times daily as needed for moderate pain or headache.     Marland Kitchen amLODipine (NORVASC) 5 MG tablet Take 5 mg by mouth every evening.     . B Complex-C (SUPER B COMPLEX PO) Take 1 tablet by mouth daily.    . bumetanide (BUMEX) 1 MG tablet Take 1 tablet (1 mg total) by mouth daily. 30 tablet 3  . carvedilol (COREG) 25 MG tablet Take 25 mg by mouth 2 (two) times daily with a  meal.    . cetirizine (ZYRTEC) 10 MG tablet Take 10 mg by mouth daily.     . clopidogrel (PLAVIX) 75 MG tablet Take 75 mg by mouth every evening.     . diclofenac sodium (VOLTAREN) 1 % GEL Apply 4 g topically at bedtime. 100 g 6  . ferrous sulfate 325 (65 FE) MG tablet Take 325 mg by mouth daily with breakfast.    . fluticasone (FLONASE) 50 MCG/ACT nasal spray Place 1 spray into both nostrils daily as needed for allergies or rhinitis.    Marland Kitchen lansoprazole (PREVACID) 15 MG capsule Take 15 mg by mouth daily.     Marland Kitchen lidocaine-prilocaine (EMLA) cream Apply to affected area once (Patient not taking: Reported on 12/20/2017) 30 g 3  . lisinopril (PRINIVIL,ZESTRIL) 40 MG tablet Take 40 mg by mouth daily.    . montelukast (SINGULAIR) 10 MG tablet Take 10 mg by mouth at bedtime.    Marland Kitchen PARoxetine (PAXIL) 10 MG tablet Take 10 mg by mouth every evening.    Vladimir Faster Glycol-Propyl Glycol (SYSTANE OP) Place 1 drop into both eyes daily as needed (for dry eyes).     . potassium chloride (K-DUR,KLOR-CON) 10 MEQ tablet Take 10 mEq by mouth every evening.     . traMADol (ULTRAM) 50 MG tablet Take 1 tablet (50 mg total) by mouth 2 (two) times daily as needed for moderate pain (mild pain). 20 tablet 0  . venlafaxine XR (EFFEXOR-XR) 37.5 MG 24 hr capsule Take 37.5 mg by mouth at bedtime.     No current facility-administered medications for this visit.    Facility-Administered Medications Ordered in Other Visits  Medication Dose Route Frequency Provider Last Rate Last Dose  . 0.9 %  sodium chloride infusion   Intravenous Once Nicholas Lose, MD      . acetaminophen (TYLENOL) tablet 650 mg  650 mg Oral Once Nicholas Lose, MD      . diphenhydrAMINE (BENADRYL) capsule 50 mg  50 mg Oral Once Nicholas Lose, MD      . heparin lock flush 100 unit/mL  500 Units Intracatheter Once PRN Nicholas Lose, MD      . sodium chloride flush (NS) 0.9 % injection 10 mL  10 mL Intracatheter PRN Nicholas Lose, MD      . trastuzumab  (HERCEPTIN) 798 mg in sodium chloride 0.9 % 250 mL chemo infusion  8 mg/kg (Treatment Plan Recorded) Intravenous Once Nicholas Lose, MD        PHYSICAL EXAMINATION: ECOG PERFORMANCE STATUS: 1 - Symptomatic but completely ambulatory  Vitals:   12/29/17 1132  BP: (!) 149/72  Pulse: 68  Resp: 17  Temp: (!) 97.5 F (36.4 C)  SpO2: 95%   Filed Weights   12/29/17 1132  Weight: 226 lb 3.2 oz (102.6 kg)    GENERAL:alert, no distress and comfortable SKIN: skin color, texture, turgor are normal, no rashes or significant lesions EYES: normal, Conjunctiva are pink and non-injected, sclera clear OROPHARYNX:no exudate, no erythema and lips,  buccal mucosa, and tongue normal  NECK: supple, thyroid normal size, non-tender, without nodularity LYMPH:  no palpable lymphadenopathy in the cervical, axillary or inguinal LUNGS: clear to auscultation and percussion with normal breathing effort HEART: regular rate & rhythm and no murmurs and no lower extremity edema ABDOMEN:abdomen soft, non-tender and normal bowel sounds MUSCULOSKELETAL:no cyanosis of digits and no clubbing  NEURO: alert & oriented x 3 with fluent speech, no focal motor/sensory deficits EXTREMITIES: No lower extremity edema   LABORATORY DATA:  I have reviewed the data as listed CMP Latest Ref Rng & Units 12/20/2017 11/17/2017 11/10/2017  Glucose 70 - 99 mg/dL 113(H) 136(H) 146(H)  BUN 8 - 23 mg/dL '12 19 15  '$ Creatinine 0.44 - 1.00 mg/dL 0.70 0.75 0.68  Sodium 135 - 145 mmol/L 141 143 143  Potassium 3.5 - 5.1 mmol/L 3.2(L) 3.9 3.6  Chloride 98 - 111 mmol/L 102 104 104  CO2 22 - 32 mmol/L 32 30 32  Calcium 8.9 - 10.3 mg/dL 8.8(L) 9.2 9.0  Total Protein 6.5 - 8.1 g/dL 6.8 6.0(L) 5.8(L)  Total Bilirubin 0.3 - 1.2 mg/dL 0.4 0.3 0.3  Alkaline Phos 38 - 126 U/L 75 86 88  AST 15 - 41 U/L 33 22 20  ALT 0 - 44 U/L 28 31 32    Lab Results  Component Value Date   WBC 5.6 12/29/2017   HGB 10.7 (L) 12/29/2017   HCT 34.2 (L) 12/29/2017    MCV 99.1 12/29/2017   PLT 160 12/29/2017   NEUTROABS 3.2 12/29/2017    ASSESSMENT & PLAN:  Malignant neoplasm of upper-outer quadrant of left breast in female, estrogen receptor positive ([place]) 05/13/17: [place] biopsy: Left breast biopsy 2 o'clock position 8 cm from nipple ultrasound-guided biopsy: Grade 2 IDC, ER 50%, PR 3%, HER-2 equivocal Ki-67 15% nipple area biopsy grade 2 IDC, ER 100% positive, PR negative, HER-2 equivocal, Ki-67 40%  06/11/17:Left Mastectomy: 2 foci of IDC 2.2 and 1 cm Grade 2, 1/2 LN Positive; Grade 2 IDC, ER 50%, PR 3%, HER-2 equivocal Ki-67 15% nipple area biopsy grade 2 IDC, ER 100% positive, PR negative, HER-2 equivocal, Ki-67 40% T2N1a Stage 1B I discussed with her that given the fact that she has lymph node positive disease, this is clinically high risk disease.  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, potential benefit of treatment at 5 years: 94.6% distant metastasis free interval for patients treated with chemotherapy  Recommendation: 1.Adjuvant chemotherapy with dose dense Adriamycin and Cytoxan x4 followed by Taxol weekly x6 (stopped for neuropathy)07/23/2017 to 11/10/2017 Herceptin adjuvant treatment started 12/29/2017 2.followed by radiation to be done in [place] 3.Followed by adjuvant antiestrogen therapy with letrozole 2.5 mg daily x5 to 7 years ----------------------------------------------------------------------- Repeat assessment revealed HER-2 negative focus but also HER-2 positive focus making her tumor heterogeneous.  Current treatment: Herceptin today is first treatment Echocardiogram 12/26/2017: EF 60 to 65%  Patient will be starting radiation soon at [facility]. Return to clinic every 3 weeks for Herceptin Every 6 weeks for labs and MD visits and follow-up.     No orders of the defined types were placed in this encounter.  The patient has a good understanding of the overall plan. she agrees with it. she will call with any  problems that may develop before the next visit here.   Harriette Ohara, MD 12/29/17

## 2017-12-29 NOTE — Patient Instructions (Signed)
[facility] Discharge Instructions for Patients Receiving Chemotherapy  Today you received the following chemotherapy agents herceptin   To help prevent nausea and vomiting after your treatment, we encourage you to take your nausea medication as directed   If you develop nausea and vomiting that is not controlled by your nausea medication, call the clinic.   BELOW ARE SYMPTOMS THAT SHOULD BE REPORTED IMMEDIATELY:  *FEVER GREATER THAN 100.5 F  *CHILLS WITH OR WITHOUT FEVER  NAUSEA AND VOMITING THAT IS NOT CONTROLLED WITH YOUR NAUSEA MEDICATION  *UNUSUAL SHORTNESS OF BREATH  *UNUSUAL BRUISING OR BLEEDING  TENDERNESS IN MOUTH AND THROAT WITH OR WITHOUT PRESENCE OF ULCERS  *URINARY PROBLEMS  *BOWEL PROBLEMS  UNUSUAL RASH Items with * indicate a potential emergency and should be followed up as soon as possible.  Feel free to call the clinic you have any questions or concerns. The clinic phone number is [phone].  

## 2017-12-29 NOTE — Progress Notes (Signed)
First time herceptin, pt refused benadryl.  "It knocks me out, and Dr. Lindi Adie took it off my chemo plan."  Reviewed with Dr. Lindi Adie.  Ok to hold today.

## 2017-12-30 ENCOUNTER — Encounter (HOSPITAL_COMMUNITY): Payer: Self-pay | Admitting: Surgery

## 2017-12-31 ENCOUNTER — Telehealth: Payer: Self-pay

## 2017-12-31 ENCOUNTER — Encounter: Payer: Self-pay | Admitting: Hematology and Oncology

## 2017-12-31 NOTE — Telephone Encounter (Signed)
Called pt to see how she is doing after Herceptin treatment last Wednesday. Pt states that she had a terrible time after treatment. Pt had sent a mychart message regarding her symptoms, shortly after treatment. Pt experienced sob, abdominal pain, back pain, extreme fatigue, and rigors for 2 hrs. Pt did not go to the ED since her symptoms eventually resolved on its own. Pt denies any fevers, rash or prolonged pain and sob. Today, pt reports feeling much better, but still has fatigue. Told pt that she may have had a delayed reaction from the infusion. Dr.Gudena will be notified about pt experience after infusion of herceptin, to determine future treatment.   Advised that pt increase hydration and if any of the previous symptoms comes back, she will need to go to the ED for further evaluation. Explained the side effects of herceptin and what can be expected, such as diarrhea, fatigue, abdominal cramping, skin irritation. Pt advised to call on Monday if she needs to be seen by symptom management. Pt verbalized understanding and will call with any further concerns.

## 2018-01-04 ENCOUNTER — Encounter: Payer: Self-pay | Admitting: Hematology and Oncology

## 2018-01-13 ENCOUNTER — Encounter: Payer: Self-pay | Admitting: Hematology and Oncology

## 2018-01-19 ENCOUNTER — Inpatient Hospital Stay (HOSPITAL_BASED_OUTPATIENT_CLINIC_OR_DEPARTMENT_OTHER): Payer: Medicare Other | Admitting: Hematology and Oncology

## 2018-01-19 ENCOUNTER — Inpatient Hospital Stay: Payer: Medicare Other

## 2018-01-19 ENCOUNTER — Inpatient Hospital Stay: Payer: Medicare Other | Attending: Hematology and Oncology

## 2018-01-19 DIAGNOSIS — C50412 Malignant neoplasm of upper-outer quadrant of left female breast: Secondary | ICD-10-CM

## 2018-01-19 DIAGNOSIS — Z170 Estrogen receptor positive status [ER+]: Principal | ICD-10-CM

## 2018-01-19 DIAGNOSIS — Z9221 Personal history of antineoplastic chemotherapy: Secondary | ICD-10-CM | POA: Diagnosis not present

## 2018-01-19 DIAGNOSIS — Z79899 Other long term (current) drug therapy: Secondary | ICD-10-CM | POA: Diagnosis not present

## 2018-01-19 DIAGNOSIS — Z5112 Encounter for antineoplastic immunotherapy: Secondary | ICD-10-CM | POA: Insufficient documentation

## 2018-01-19 DIAGNOSIS — Z95828 Presence of other vascular implants and grafts: Secondary | ICD-10-CM

## 2018-01-19 LAB — CMP (CANCER CENTER ONLY)
ALBUMIN: 3.4 g/dL — AB (ref 3.5–5.0)
ALT: 27 U/L (ref 0–44)
AST: 31 U/L (ref 15–41)
Alkaline Phosphatase: 84 U/L (ref 38–126)
Anion gap: 6 (ref 5–15)
BUN: 8 mg/dL (ref 8–23)
CHLORIDE: 103 mmol/L (ref 98–111)
CO2: 33 mmol/L — AB (ref 22–32)
Calcium: 9.1 mg/dL (ref 8.9–10.3)
Creatinine: 0.69 mg/dL (ref 0.44–1.00)
GFR, Est AFR Am: >60 mL/min (ref >60)
GFR, Estimated: >60 mL/min (ref >60)
Glucose, Bld: 104 mg/dL — ABNORMAL HIGH (ref 70–99)
POTASSIUM: 3.6 mmol/L (ref 3.5–5.1)
SODIUM: 142 mmol/L (ref 135–145)
Total Bilirubin: 0.3 mg/dL (ref 0.3–1.2)
Total Protein: 6.4 g/dL — ABNORMAL LOW (ref 6.5–8.1)

## 2018-01-19 LAB — CBC WITH DIFFERENTIAL (CANCER CENTER ONLY)
ABS IMMATURE GRANULOCYTES: 0 10*3/uL (ref 0.00–0.07)
BASOS ABS: 0 10*3/uL (ref 0.0–0.1)
BASOS PCT: 0 %
Eosinophils Absolute: 0.1 10*3/uL (ref 0.0–0.5)
Eosinophils Relative: 4 %
HCT: 33.5 % — ABNORMAL LOW (ref 36.0–46.0)
Hemoglobin: 10.8 g/dL — ABNORMAL LOW (ref 12.0–15.0)
IMMATURE GRANULOCYTES: 0 %
Lymphocytes Relative: 24 %
Lymphs Abs: 0.7 10*3/uL (ref 0.7–4.0)
MCH: 31 pg (ref 26.0–34.0)
MCHC: 32.2 g/dL (ref 30.0–36.0)
MCV: 96.3 fL (ref 80.0–100.0)
MONOS PCT: 16 %
Monocytes Absolute: 0.5 10*3/uL (ref 0.1–1.0)
NEUTROS ABS: 1.7 10*3/uL (ref 1.7–7.7)
NEUTROS PCT: 56 %
NRBC: 0 % (ref 0.0–0.2)
PLATELETS: 160 10*3/uL (ref 150–400)
RBC: 3.48 MIL/uL — AB (ref 3.87–5.11)
RDW: 12.9 % (ref 11.5–15.5)
WBC: 3.1 10*3/uL — AB (ref 4.0–10.5)

## 2018-01-19 MED ORDER — ACETAMINOPHEN 325 MG PO TABS
650.0000 mg | ORAL_TABLET | Freq: Once | ORAL | Status: AC
  Administered 2018-01-19: 650 mg via ORAL

## 2018-01-19 MED ORDER — SODIUM CHLORIDE 0.9% FLUSH
10.0000 mL | INTRAVENOUS | Status: DC | PRN
  Administered 2018-01-19: 10 mL
  Filled 2018-01-19: qty 10

## 2018-01-19 MED ORDER — METHYLPREDNISOLONE SODIUM SUCC 125 MG IJ SOLR
INTRAMUSCULAR | Status: AC
  Filled 2018-01-19: qty 2

## 2018-01-19 MED ORDER — FAMOTIDINE IN NACL 20-0.9 MG/50ML-% IV SOLN
INTRAVENOUS | Status: AC
  Filled 2018-01-19: qty 50

## 2018-01-19 MED ORDER — SODIUM CHLORIDE 0.9 % IV SOLN
Freq: Once | INTRAVENOUS | Status: AC
  Administered 2018-01-19: 15:00:00 via INTRAVENOUS
  Filled 2018-01-19: qty 250

## 2018-01-19 MED ORDER — ACETAMINOPHEN 325 MG PO TABS
ORAL_TABLET | ORAL | Status: AC
  Filled 2018-01-19: qty 2

## 2018-01-19 MED ORDER — TRASTUZUMAB CHEMO 150 MG IV SOLR
6.0000 mg/kg | Freq: Once | INTRAVENOUS | Status: AC
  Administered 2018-01-19: 588 mg via INTRAVENOUS
  Filled 2018-01-19: qty 28

## 2018-01-19 MED ORDER — DIPHENHYDRAMINE HCL 25 MG PO CAPS
25.0000 mg | ORAL_CAPSULE | Freq: Once | ORAL | Status: AC
  Administered 2018-01-19: 25 mg via ORAL

## 2018-01-19 MED ORDER — HEPARIN SOD (PORK) LOCK FLUSH 100 UNIT/ML IV SOLN
500.0000 [IU] | Freq: Once | INTRAVENOUS | Status: AC | PRN
  Administered 2018-01-19: 500 [IU]
  Filled 2018-01-19: qty 5

## 2018-01-19 MED ORDER — METHYLPREDNISOLONE SODIUM SUCC 125 MG IJ SOLR
60.0000 mg | Freq: Once | INTRAMUSCULAR | Status: AC
  Administered 2018-01-19: 60 mg via INTRAVENOUS

## 2018-01-19 MED ORDER — DIPHENHYDRAMINE HCL 25 MG PO CAPS
ORAL_CAPSULE | ORAL | Status: AC
  Filled 2018-01-19: qty 1

## 2018-01-19 MED ORDER — FAMOTIDINE IN NACL 20-0.9 MG/50ML-% IV SOLN
20.0000 mg | Freq: Once | INTRAVENOUS | Status: AC
  Administered 2018-01-19: 20 mg via INTRAVENOUS

## 2018-01-19 NOTE — Progress Notes (Signed)
Patient Care Team: Sherrilee Gilles, DO as PCP - General (Family Medicine)  DIAGNOSIS:  Encounter Diagnosis  Name Primary?  . Malignant neoplasm of upper-outer quadrant of left breast in female, estrogen receptor positive ([place])     SUMMARY OF ONCOLOGIC HISTORY:   Malignant neoplasm of upper-outer quadrant of left breast in female, estrogen receptor positive ([place])   05/13/2017 Initial Diagnosis    [place] biopsy: Left breast biopsy 2 o'clock position 8 cm from nipple ultrasound-guided biopsy: Grade 2 IDC, ER 50%, PR 3%, HER-2 equivocal Ki-67 15% nipple area biopsy grade 2 IDC, ER 100% positive, PR negative, HER-2 equivocal, Ki-67 40%    05/31/2017 Breast MRI    Left breast UOQ 230 position: 2 x 1.9 x 1.9 cm mass, second mass left breast UOQ 230 position: 1 x 1 x 0.7 cm, 2 more enhancing adjacent 3 and 5 mm nodules slightly superiorly midway between the 2 masses.  No abnormal lymph nodes, right breast normal    06/11/2017 Surgery    Left Mastectomy: 2 foci of IDC 2.2 and 1 cm Grade 2, 1/2 LN Positive; Grade 2 IDC, ER 50%, PR 3%, HER-2 equivocal Ki-67 15% nipple area biopsy grade 2 IDC, ER 100% positive, PR negative, HER-2 equivocal, Ki-67 40% T2N1a Stage 1B    06/25/2017 Miscellaneous    Mammaprint high risk luminal type B    07/23/2017 - 11/10/2017 Chemotherapy    Adjuvant chemotherapy with dose dense Adriamycin and Cytoxan x4 followed by Taxol weekly x6     12/10/2017 Pathology Results    Repeat analysis of pathology confirmed that patient has heterogeneous HER-2 signaling.  There is equivocal IHC 2+ and fish negative areas where the ratio was 1.49 and copy number of 2.9.  There is one area that is HER-2 +3+ by [facility]    12/17/2017 -  Chemotherapy    The patient had trastuzumab (HERCEPTIN) 798 mg in sodium chloride 0.9 % 250 mL chemo infusion, 8 mg/kg = 798 mg, Intravenous,  Once, 1 of 18 cycles Administration: 798 mg (12/29/2017)  for chemotherapy treatment.      CHIEF  COMPLIANT: Follow-up on Herceptin, ongoing radiation  INTERVAL HISTORY: Breanna [name] is a 72 year old with above-mentioned history of left breast cancer who initially underwent mastectomy followed by adjuvant chemotherapy and is currently on radiation.  She is getting adjuvant Herceptin every 3 weeks.  After the first treatment she went home and had severe chills and abdominal pain radiating to the back.  This lasted for 2 hours and it went away.  She is very anxious about having the same reaction this time.  She has been taking Bumex and that has reduced her leg swelling significantly.  She lost about 10 pounds.  REVIEW OF SYSTEMS:   Constitutional: Denies fevers, chills or abnormal weight loss Eyes: Denies blurriness of vision Ears, nose, mouth, throat, and face: Denies mucositis or sore throat Respiratory: Denies cough, dyspnea or wheezes Cardiovascular: Denies palpitation, chest discomfort Gastrointestinal:  Denies nausea, heartburn or change in bowel habits Skin: Denies abnormal skin rashes Lymphatics: Denies new lymphadenopathy or easy bruising Neurological:Denies numbness, tingling or new weaknesses Behavioral/Psych: Mood is stable, no new changes  Extremities: No lower extremity edema   All other systems were reviewed with the patient and are negative.  I have reviewed the past medical history, past surgical history, social history and family history with the patient and they are unchanged from previous note.  ALLERGIES:  is allergic to atorvastatin; vancomycin; ancef [cefazolin]; lactose;  nasonex [mometasone furoate]; and neurontin [gabapentin].  MEDICATIONS:  Current Outpatient Medications  Medication Sig Dispense Refill  . acetaminophen (TYLENOL) 500 MG tablet Take 500 mg by mouth 2 (two) times daily as needed for moderate pain or headache.     . amLODipine (NORVASC) 5 MG tablet Take 5 mg by mouth every evening.     . B Complex-C (SUPER B COMPLEX PO) Take 1 tablet by mouth  daily.    . bumetanide (BUMEX) 1 MG tablet Take 1 tablet (1 mg total) by mouth daily. 30 tablet 3  . carvedilol (COREG) 25 MG tablet Take 25 mg by mouth 2 (two) times daily with a meal.    . cetirizine (ZYRTEC) 10 MG tablet Take 10 mg by mouth daily.     . clopidogrel (PLAVIX) 75 MG tablet Take 75 mg by mouth every evening.     . diclofenac sodium (VOLTAREN) 1 % GEL Apply 4 g topically at bedtime. 100 g 6  . ferrous sulfate 325 (65 FE) MG tablet Take 325 mg by mouth daily with breakfast.    . fluticasone (FLONASE) 50 MCG/ACT nasal spray Place 1 spray into both nostrils daily as needed for allergies or rhinitis.    . lansoprazole (PREVACID) 15 MG capsule Take 15 mg by mouth daily.     . lidocaine-prilocaine (EMLA) cream Apply to affected area once (Patient not taking: Reported on 12/20/2017) 30 g 3  . lisinopril (PRINIVIL,ZESTRIL) 40 MG tablet Take 40 mg by mouth daily.    . montelukast (SINGULAIR) 10 MG tablet Take 10 mg by mouth at bedtime.    . PARoxetine (PAXIL) 10 MG tablet Take 10 mg by mouth every evening.    . Polyethyl Glycol-Propyl Glycol (SYSTANE OP) Place 1 drop into both eyes daily as needed (for dry eyes).     . potassium chloride (K-DUR,KLOR-CON) 10 MEQ tablet Take 10 mEq by mouth every evening.     . traMADol (ULTRAM) 50 MG tablet Take 1 tablet (50 mg total) by mouth 2 (two) times daily as needed for moderate pain (mild pain). 20 tablet 0  . venlafaxine XR (EFFEXOR-XR) 37.5 MG 24 hr capsule Take 37.5 mg by mouth at bedtime.     No current facility-administered medications for this visit.     PHYSICAL EXAMINATION: ECOG PERFORMANCE STATUS: 1 - Symptomatic but completely ambulatory  Vitals:   01/19/18 1422  BP: 136/71  Pulse: 68  Resp: 16  Temp: 97.8 F (36.6 C)  SpO2: 93%   Filed Weights   01/19/18 1422  Weight: 219 lb 6.4 oz (99.5 kg)    GENERAL:alert, no distress and comfortable SKIN: skin color, texture, turgor are normal, no rashes or significant lesions EYES:  normal, Conjunctiva are pink and non-injected, sclera clear OROPHARYNX:no exudate, no erythema and lips, buccal mucosa, and tongue normal  NECK: supple, thyroid normal size, non-tender, without nodularity LYMPH:  no palpable lymphadenopathy in the cervical, axillary or inguinal LUNGS: clear to auscultation and percussion with normal breathing effort HEART: regular rate & rhythm and no murmurs and no lower extremity edema ABDOMEN:abdomen soft, non-tender and normal bowel sounds MUSCULOSKELETAL:no cyanosis of digits and no clubbing  NEURO: alert & oriented x 3 with fluent speech, no focal motor/sensory deficits EXTREMITIES: No lower extremity edema   LABORATORY DATA:  I have reviewed the data as listed CMP Latest Ref Rng & Units 01/19/2018 12/29/2017 12/20/2017  Glucose 70 - 99 mg/dL 104(H) 139(H) 113(H)  BUN 8 - 23 mg/dL 8 11 12    Creatinine 0.44 - 1.00 mg/dL 0.69 0.68 0.70  Sodium 135 - 145 mmol/L 142 144 141  Potassium 3.5 - 5.1 mmol/L 3.6 3.7 3.2(L)  Chloride 98 - 111 mmol/L 103 106 102  CO2 22 - 32 mmol/L 33(H) 31 32  Calcium 8.9 - 10.3 mg/dL 9.1 8.9 8.8(L)  Total Protein 6.5 - 8.1 g/dL 6.4(L) 6.0(L) 6.8  Total Bilirubin 0.3 - 1.2 mg/dL 0.3 <0.2(L) 0.4  Alkaline Phos 38 - 126 U/L 84 69 75  AST 15 - 41 U/L 31 24 33  ALT 0 - 44 U/L _0 Lab Results  Component Value Date   WBC 3.1 (L) 01/19/2018   HGB 10.8 (L) 01/19/2018   HCT 33.5 (L) 01/19/2018   MCV 96.3 01/19/2018   PLT 160 01/19/2018   NEUTROABS 1.7 01/19/2018    ASSESSMENT & PLAN:  Malignant neoplasm of upper-outer quadrant of left breast in female, estrogen receptor positive ([place]) 05/13/17: [place] biopsy: Left breast biopsy 2 o'clock position 8 cm from nipple ultrasound-guided biopsy: Grade 2 IDC, ER 50%, PR 3%, HER-2 equivocal Ki-67 15% nipple area biopsy grade 2 IDC, ER 100% positive, PR negative, HER-2 equivocal, Ki-67 40%  06/11/17:Left Mastectomy: 2 foci of IDC 2.2 and 1 cm Grade 2, 1/2 LN  Positive; Grade 2 IDC, ER 50%, PR 3%, HER-2 equivocal Ki-67 15% nipple area biopsy grade 2 IDC, ER 100% positive, PR negative, HER-2 equivocal, Ki-67 40% T2N1a Stage 1B I discussed with her that given the fact that she has lymph node positive disease, this is clinically high risk disease.  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, potential benefit of treatment at 5 years: 94.6% distant metastasis free interval for patients treated with chemotherapy  Recommendation: 1.Adjuvant chemotherapy with dose dense Adriamycin and Cytoxan x4 followed by Taxol weekly x6 (stopped for neuropathy)07/23/2017 to 11/10/2017 Herceptin adjuvant treatment started 12/29/2017 2.followed by radiation to be done in [place] 3.Followed by adjuvant antiestrogen therapy with letrozole 2.5 mg daily x5 to 7 years ----------------------------------------------------------------------- Repeat assessment revealed HER-2 negative focus but also HER-2 positivefocus making her tumor heterogeneous.  Current treatment: Herceptin Ongoing radiation therapy  Herceptin toxicities: Patient had a reaction delayed to Herceptin.  After she went home she had 2 hours of severe chills and abdominal pain radiating to the back  Because of this we elected to add Pepcid and Solu-Medrol to today's treatment.  In addition she will also take Benadryl which she avoided with the last treatment.  We will see her back in 3 weeks for her next Herceptin and decide if her plan has worked.    No orders of the defined types were placed in this encounter.  The patient has a good understanding of the overall plan. she agrees with it. she will call with any problems that may develop before the next visit here.   Harriette Ohara, MD 01/19/18

## 2018-01-19 NOTE — Patient Instructions (Signed)
[facility] Discharge Instructions for Patients Receiving Chemotherapy  Today you received the following chemotherapy agents: Trastuzumab (Herceptin).  To help prevent nausea and vomiting after your treatment, we encourage you to take your nausea medication as prescribed. If you develop nausea and vomiting that is not controlled by your nausea medication, call the clinic.   BELOW ARE SYMPTOMS THAT SHOULD BE REPORTED IMMEDIATELY:  *FEVER GREATER THAN 100.5 F  *CHILLS WITH OR WITHOUT FEVER  NAUSEA AND VOMITING THAT IS NOT CONTROLLED WITH YOUR NAUSEA MEDICATION  *UNUSUAL SHORTNESS OF BREATH  *UNUSUAL BRUISING OR BLEEDING  TENDERNESS IN MOUTH AND THROAT WITH OR WITHOUT PRESENCE OF ULCERS  *URINARY PROBLEMS  *BOWEL PROBLEMS  UNUSUAL RASH Items with * indicate a potential emergency and should be followed up as soon as possible.  Feel free to call the clinic should you have any questions or concerns. The clinic phone number is [phone].  Please show the CHEMO ALERT CARD at check-in to the Emergency Department and triage nurse.   

## 2018-01-19 NOTE — Assessment & Plan Note (Signed)
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, potential benefit of treatment at 5 years: 94.6% distant metastasis free interval for patients treated with chemotherapy  Recommendation: 1.Adjuvant chemotherapy with dose dense Adriamycin and Cytoxan x4 followed by Taxol weekly x6 (stopped for neuropathy)07/23/2017 to 11/10/2017 Herceptin adjuvant treatment started 12/29/2017 2.followed by radiation to be done in [name] 3.Followed by adjuvant antiestrogen therapy with letrozole 2.5 mg daily x5 to 7 years ----------------------------------------------------------------------- Repeat assessment revealed HER-2 negative focus but also HER-2 positivefocus making her tumor heterogeneous.  Current treatment: Herceptin Ongoing radiation therapy  Herceptin toxicities: Patient had a reaction delayed to Herceptin.  After she went home she had 2 hours of severe chills and abdominal pain radiating to the back  Because of this we elected to add Pepcid and Solu-Medrol to today's treatment.  In addition she will also take Benadryl which she avoided with the last treatment.  We will see her back in 3 weeks for her next Herceptin and decide if her plan has worked.

## 2018-01-31 ENCOUNTER — Encounter (HOSPITAL_COMMUNITY): Payer: Self-pay

## 2018-01-31 ENCOUNTER — Encounter (HOSPITAL_COMMUNITY)
Admission: RE | Admit: 2018-01-31 | Discharge: 2018-01-31 | Disposition: A | Discharge status: Home / Self Care | Payer: Medicare Other | Source: Ambulatory Visit | Attending: Pulmonary Disease | Admitting: Pulmonary Disease

## 2018-01-31 VITALS — BP 130/72 | HR 71 | Ht 64.0 in | Wt 217.6 lb

## 2018-01-31 DIAGNOSIS — J986 Disorders of diaphragm: Secondary | ICD-10-CM | POA: Insufficient documentation

## 2018-01-31 DIAGNOSIS — J984 Other disorders of lung: Secondary | ICD-10-CM | POA: Diagnosis not present

## 2018-01-31 NOTE — Progress Notes (Signed)
Daily Session Note  Patient Details  Name: Breanna [name] MRN: [id number] Date of Birth: [birth year] Referring Provider:     PULMONARY REHAB OTHER RESP ORIENTATION from 01/31/2018 in [place]  Referring Provider  [place]      Encounter Date: 01/31/2018  Check In: Session Check In - 01/31/18 1230      Check-In   Supervising physician immediately available to respond to emergencies  See telemetry face sheet for immediately available MD    Location  [facility]    Medication changes reported      No    Fall or balance concerns reported     No    Tobacco Cessation  --   Quit 1991   Warm-up and Cool-down  Performed as group-led instruction    Resistance Training Performed  Yes    VAD Patient?  No    PAD/SET Patient?  No      Pain Assessment   Currently in Pain?  No/denies    Pain Score  0-No pain    Multiple Pain Sites  No       Capillary Blood Glucose: No results found for this or any previous visit (from the past 24 hour(s)).    Social History   Tobacco Use  Smoking Status Former Smoker  . Packs/day: 1.00  . Years: 15.00  . Pack years: 15.00  . Types: Cigarettes  . Last attempt to quit: 03/09/1988  . Years since quitting: 29.9  Smokeless Tobacco Never Used    Goals Met:  Proper associated with RPD/PD & O2 Sat Independence with exercise equipment Using PLB without cueing & demonstrates good technique Exercise tolerated well Personal goals reviewed No report of cardiac concerns or symptoms Strength training completed today  Goals Unmet:  Not Applicable  Comments: Check out: 1430   Dr. Sinda Du is Medical Director for [facility].
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
## 2018-01-31 NOTE — Progress Notes (Signed)
Cardiac/Pulmonary Rehab Medication Review by a Pharmacist  Does the patient  feel that his/her medications are working for him/her?  yes  Has the patient been experiencing any side effects to the medications prescribed?  no  Does the patient measure his/her own blood pressure or blood glucose at home?  yes   Does the patient have any problems obtaining medications due to transportation or finances?   no  Understanding of regimen: good Understanding of indications: good Potential of compliance: good  Questions asked to Determine Patient Understanding of Medication Regimen:  1. What is the name of the medication?  2. What is the medication used for?  3. When should it be taken?  4. How much should be taken?  5. How will you take it?  6. What side effects should you report?  Understanding Defined as: Excellent: All questions above are correct Good: Questions 1-4 are correct Fair: Questions 1-2 are correct  Poor: 1 or none of the above questions are correct   Pharmacist comments: Overall, patient is tolerating medications and good compliance.  Had a question about hydrochlorothiazide which wasn't sure if doctor intended for her to take or not.  I explained it works differently than bumex and is good for BP so to check with MD.    Ramond Craver 01/31/2018 2:07 PM
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
## 2018-02-08 ENCOUNTER — Encounter (HOSPITAL_COMMUNITY): Payer: Medicare Other

## 2018-02-08 ENCOUNTER — Encounter (HOSPITAL_COMMUNITY): Payer: Self-pay

## 2018-02-08 ENCOUNTER — Encounter: Payer: Self-pay | Admitting: Hematology and Oncology

## 2018-02-08 NOTE — Assessment & Plan Note (Signed)
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, potential benefit of treatment at 5 years: 94.6% distant metastasis free interval for patients treated with chemotherapy  Recommendation: 1.Adjuvant chemotherapy with dose dense Adriamycin and Cytoxan x4 followed by Taxol weekly x6 (stopped for neuropathy)07/23/2017 to 11/10/2017 Herceptin adjuvant treatment started 12/29/2017 2.followed by radiation to be done in [place] 3.Followed by adjuvant antiestrogen therapy with letrozole 2.5 mg daily x5 to 7 years ----------------------------------------------------------------------- Repeat assessment revealed HER-2 negative focus but also HER-2 positivefocus making her tumor heterogeneous.  Current treatment: Herceptin Ongoing radiation therapy  Herceptin toxicities: Patient had a reaction delayed to Herceptin.  After she went home she had 2 hours of severe chills and abdominal pain radiating to the back  Because of this we elected to add Pepcid and Solu-Medrol to treatment.  In addition she will also take Benadryl which she avoided with the last treatment.  We will see her back in 3 weeks for her next Herceptin and decide if her plan has worked.

## 2018-02-08 NOTE — Progress Notes (Signed)
Patient Care Team: Sherrilee Gilles, DO as PCP - General (Family Medicine)  DIAGNOSIS:    ICD-10-CM   1. Malignant neoplasm of upper-outer quadrant of left breast in female, estrogen receptor positive ([place]) C50.412    Z17.0     SUMMARY OF ONCOLOGIC HISTORY:   Malignant neoplasm of upper-outer quadrant of left breast in female, estrogen receptor positive ([place])   05/13/2017 Initial Diagnosis    [place] biopsy: Left breast biopsy 2 o'clock position 8 cm from nipple ultrasound-guided biopsy: Grade 2 IDC, ER 50%, PR 3%, HER-2 equivocal Ki-67 15% nipple area biopsy grade 2 IDC, ER 100% positive, PR negative, HER-2 equivocal, Ki-67 40%    05/31/2017 Breast MRI    Left breast UOQ 230 position: 2 x 1.9 x 1.9 cm mass, second mass left breast UOQ 230 position: 1 x 1 x 0.7 cm, 2 more enhancing adjacent 3 and 5 mm nodules slightly superiorly midway between the 2 masses.  No abnormal lymph nodes, right breast normal    06/11/2017 Surgery    Left Mastectomy: 2 foci of IDC 2.2 and 1 cm Grade 2, 1/2 LN Positive; Grade 2 IDC, ER 50%, PR 3%, HER-2 equivocal Ki-67 15% nipple area biopsy grade 2 IDC, ER 100% positive, PR negative, HER-2 equivocal, Ki-67 40% T2N1a Stage 1B    06/25/2017 Miscellaneous    Mammaprint high risk luminal type B    07/23/2017 - 11/10/2017 Chemotherapy    Adjuvant chemotherapy with dose dense Adriamycin and Cytoxan x4 followed by Taxol weekly x6     12/10/2017 Pathology Results    Repeat analysis of pathology confirmed that patient has heterogeneous HER-2 signaling.  There is equivocal IHC 2+ and fish negative areas where the ratio was 1.49 and copy number of 2.9.  There is one area that is HER-2 +3+ by [facility]    12/17/2017 -  Chemotherapy    The patient had trastuzumab (HERCEPTIN) 798 mg in sodium chloride 0.9 % 250 mL chemo infusion, 8 mg/kg = 798 mg, Intravenous,  Once, 2 of 18 cycles Administration: 798 mg (12/29/2017), 588 mg (01/19/2018)  for chemotherapy treatment.       CHIEF COMPLIANT: Follow-up on Cycle 3 of Herceptin  INTERVAL HISTORY: JAHNA LIEBERT is a 72 y.o. with above-mentioned history of left breast cancer who initially underwent mastectomy followed by adjuvant chemotherapy and is currently on radiation and is getting adjuvant Herceptin every 3 weeks. She presents to the clinic today with her husband and said Herceptin treatment went well today. She notes she had facial redness the day after Herceptin but otherwise denies any symptoms. She reports there is a lot of redness and soreness around her radiation site. She reports her breathing is slightly improved, although she still uses portable oxygen, and she has been moving around more. She denies diarrhea or swelling in her feet. She notes she is extremely fatigued. She reports she has completely lost her appetite and when she eats it is usually bread. Her most recent CBC from 02/09/18 shows WBC 2.8, Hg 11.2, platelets 142.   REVIEW OF SYSTEMS:   Constitutional: Denies fevers, chills or abnormal weight loss (+) extreme fatigue (+) loss of appetite Eyes: Denies blurriness of vision Ears, nose, mouth, throat, and face: Denies mucositis or sore throat Respiratory: Chronic shortness of breath Cardiovascular: Denies palpitation, chest discomfort Gastrointestinal:  Denies nausea, heartburn or change in bowel habits Skin: Denies abnormal skin rashes Lymphatics: Denies new lymphadenopathy or easy bruising Neurological: Denies numbness, tingling or new weaknesses  Behavioral/Psych: Mood is stable, no new changes  Extremities: Improvement in lower extremity edema with Bumex Breast: denies any pain or lumps or nodules in either breasts All other systems were reviewed with the patient and are negative.  I have reviewed the past medical history, past surgical history, social history and family history with the patient and they are unchanged from previous note.  ALLERGIES:  is allergic to atorvastatin;  vancomycin; ancef [cefazolin]; lactose; nasonex [mometasone furoate]; and neurontin [gabapentin].  MEDICATIONS:  Current Outpatient Medications  Medication Sig Dispense Refill  . acetaminophen (TYLENOL) 500 MG tablet Take 500 mg by mouth 2 (two) times daily as needed for moderate pain or headache.     Marland Kitchen amLODipine (NORVASC) 5 MG tablet Take 5 mg by mouth every evening.     . B Complex-C (SUPER B COMPLEX PO) Take 1 tablet by mouth daily.    . bumetanide (BUMEX) 1 MG tablet Take 1 tablet (1 mg total) by mouth daily. 30 tablet 3  . carvedilol (COREG) 25 MG tablet Take 25 mg by mouth 2 (two) times daily with a meal.    . cetirizine (ZYRTEC) 10 MG tablet Take 10 mg by mouth daily.     . clopidogrel (PLAVIX) 75 MG tablet Take 75 mg by mouth every evening.     . diclofenac sodium (VOLTAREN) 1 % GEL Apply 4 g topically at bedtime. 100 g 6  . ferrous sulfate 325 (65 FE) MG tablet Take 325 mg by mouth daily with breakfast.    . fluticasone (FLONASE) 50 MCG/ACT nasal spray Place 1 spray into both nostrils daily as needed for allergies or rhinitis.    Marland Kitchen lansoprazole (PREVACID) 15 MG capsule Take 15 mg by mouth daily.     Marland Kitchen lidocaine-prilocaine (EMLA) cream Apply to affected area once (Patient not taking: Reported on 12/20/2017) 30 g 3  . lisinopril (PRINIVIL,ZESTRIL) 40 MG tablet Take 40 mg by mouth daily.    . montelukast (SINGULAIR) 10 MG tablet Take 10 mg by mouth at bedtime.    Marland Kitchen PARoxetine (PAXIL) 10 MG tablet Take 10 mg by mouth every evening.    Vladimir Faster Glycol-Propyl Glycol (SYSTANE OP) Place 1 drop into both eyes daily as needed (for dry eyes).     . potassium chloride (K-DUR,KLOR-CON) 10 MEQ tablet Take 10 mEq by mouth every evening.     . traMADol (ULTRAM) 50 MG tablet Take 1 tablet (50 mg total) by mouth 2 (two) times daily as needed for moderate pain (mild pain). 20 tablet 0  . venlafaxine XR (EFFEXOR-XR) 37.5 MG 24 hr capsule Take 37.5 mg by mouth at bedtime.     No current  facility-administered medications for this visit.     PHYSICAL EXAMINATION: ECOG PERFORMANCE STATUS: 1 - Symptomatic but completely ambulatory  Vitals:   02/09/18 1346  BP: (!) 112/93  Pulse: 73  Resp: 17  Temp: 97.7 F (36.5 C)  SpO2: 92%   Filed Weights   02/09/18 1346  Weight: 218 lb 14.4 oz (99.3 kg)    GENERAL:alert, no distress and comfortable SKIN: skin color, texture, turgor are normal, no rashes or significant lesions EYES: normal, Conjunctiva are pink and non-injected, sclera clear OROPHARYNX:no exudate, no erythema and lips, buccal mucosa, and tongue normal  NECK: supple, thyroid normal size, non-tender, without nodularity LYMPH:  no palpable lymphadenopathy in the cervical, axillary or inguinal LUNGS: clear to auscultation and percussion with normal breathing effort HEART: regular rate & rhythm and no murmurs and no  lower extremity edema ABDOMEN:abdomen soft, non-tender and normal bowel sounds MUSCULOSKELETAL:no cyanosis of digits and no clubbing  NEURO: alert & oriented x 3 with fluent speech, no focal motor/sensory deficits EXTREMITIES: No lower extremity edema  LABORATORY DATA:  I have reviewed the data as listed CMP Latest Ref Rng & Units 02/09/2018 01/19/2018 12/29/2017  Glucose 70 - 99 mg/dL 142(H) 104(H) 139(H)  BUN 8 - 23 mg/dL 12 8 11  Creatinine 0.44 - 1.00 mg/dL 0.71 0.69 0.68  Sodium 135 - 145 mmol/L 143 142 144  Potassium 3.5 - 5.1 mmol/L 3.4(L) 3.6 3.7  Chloride 98 - 111 mmol/L 101 103 106  CO2 22 - 32 mmol/L 35(H) 33(H) 31  Calcium 8.9 - 10.3 mg/dL 9.1 9.1 8.9  Total Protein 6.5 - 8.1 g/dL 6.5 6.4(L) 6.0(L)  Total Bilirubin 0.3 - 1.2 mg/dL 0.3 0.3 <0.2(L)  Alkaline Phos 38 - 126 U/L 82 84 69  AST 15 - 41 U/L 31 31 24  ALT 0 - 44 U/L 28 27 25    Lab Results  Component Value Date   WBC 2.8 (L) 02/09/2018   HGB 11.2 (L) 02/09/2018   HCT 34.0 (L) 02/09/2018   MCV 94.4 02/09/2018   PLT 142 (L) 02/09/2018   NEUTROABS 1.7 02/09/2018     ASSESSMENT & PLAN:  Malignant neoplasm of upper-outer quadrant of left breast in female, estrogen receptor positive ([facility]) 05/13/17: [place] biopsy: Left breast biopsy 2 o'clock position 8 cm from nipple ultrasound-guided biopsy: Grade 2 IDC, ER 50%, PR 3%, HER-2 equivocal Ki-67 15% nipple area biopsy grade 2 IDC, ER 100% positive, PR negative, HER-2 equivocal, Ki-67 40%  06/11/17:Left Mastectomy: 2 foci of IDC 2.2 and 1 cm Grade 2, 1/2 LN Positive; Grade 2 IDC, ER 50%, PR 3%, HER-2 equivocal Ki-67 15% nipple area biopsy grade 2 IDC, ER 100% positive, PR negative, HER-2 equivocal, Ki-67 40% T2N1a Stage 1B I discussed with her that given the fact that she has lymph node positive disease, this is clinically high risk disease.  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, potential benefit of treatment at 5 years: 94.6% distant metastasis free interval for patients treated with chemotherapy  Recommendation: 1.Adjuvant chemotherapy with dose dense Adriamycin and Cytoxan x4 followed by Taxol weekly x6 (stopped for neuropathy)07/23/2017 to 11/10/2017 Herceptin adjuvant treatment started 12/29/2017 2.followed by radiation to be done in [place] 3.Followed by adjuvant antiestrogen therapy with letrozole 2.5 mg daily x5 to 7 years ----------------------------------------------------------------------- Repeat assessment revealed HER-2 negative focus but also HER-2 positivefocus making her tumor heterogeneous.  Current treatment: Herceptin Ongoing radiation therapy: Ongoing radiation dermatitis  Herceptin toxicities: Patient had a reaction delayed to Herceptin.  After she went home she had 2 hours of severe chills and abdominal pain radiating to the back She did not have any more adverse effects after the last Herceptin treatment.  She did have flushing sensation.  Because of this we elected to add Pepcid and Solu-Medrol to treatment.  In addition she will also take Benadryl which she  avoided with the last treatment. Follow-up every 3 weeks for Herceptin every 6 weeks with labs and follow-up with me    No orders of the defined types were placed in this encounter.  The patient has a good understanding of the overall plan. she agrees with it. she will call with any problems that may develop before the next visit here.  Gudena, Vinay, MD 02/09/2018   I, Molly Dorshimer, am acting as scribe for Vinay Gudena, MD.    I have reviewed the above documentation for accuracy and completeness, and I agree with the above.

## 2018-02-09 ENCOUNTER — Inpatient Hospital Stay (HOSPITAL_BASED_OUTPATIENT_CLINIC_OR_DEPARTMENT_OTHER): Payer: Medicare Other | Admitting: Hematology and Oncology

## 2018-02-09 ENCOUNTER — Telehealth: Payer: Self-pay | Admitting: Hematology and Oncology

## 2018-02-09 ENCOUNTER — Inpatient Hospital Stay: Payer: Medicare Other | Attending: Hematology and Oncology

## 2018-02-09 ENCOUNTER — Inpatient Hospital Stay: Payer: Medicare Other

## 2018-02-09 DIAGNOSIS — Z9012 Acquired absence of left breast and nipple: Secondary | ICD-10-CM | POA: Diagnosis not present

## 2018-02-09 DIAGNOSIS — Z9221 Personal history of antineoplastic chemotherapy: Secondary | ICD-10-CM | POA: Insufficient documentation

## 2018-02-09 DIAGNOSIS — C50412 Malignant neoplasm of upper-outer quadrant of left female breast: Secondary | ICD-10-CM | POA: Insufficient documentation

## 2018-02-09 DIAGNOSIS — Z5112 Encounter for antineoplastic immunotherapy: Secondary | ICD-10-CM | POA: Insufficient documentation

## 2018-02-09 DIAGNOSIS — Z170 Estrogen receptor positive status [ER+]: Secondary | ICD-10-CM

## 2018-02-09 DIAGNOSIS — Z79899 Other long term (current) drug therapy: Secondary | ICD-10-CM

## 2018-02-09 DIAGNOSIS — Z95828 Presence of other vascular implants and grafts: Secondary | ICD-10-CM

## 2018-02-09 LAB — CMP (CANCER CENTER ONLY)
ALT: 28 U/L (ref 0–44)
AST: 31 U/L (ref 15–41)
Albumin: 3.4 g/dL — ABNORMAL LOW (ref 3.5–5.0)
Alkaline Phosphatase: 82 U/L (ref 38–126)
Anion gap: 7 (ref 5–15)
BUN: 12 mg/dL (ref 8–23)
CO2: 35 mmol/L — ABNORMAL HIGH (ref 22–32)
Calcium: 9.1 mg/dL (ref 8.9–10.3)
Chloride: 101 mmol/L (ref 98–111)
Creatinine: 0.71 mg/dL (ref 0.44–1.00)
GFR, Est AFR Am: >60 mL/min (ref >60)
GFR, Estimated: >60 mL/min (ref >60)
Glucose, Bld: 142 mg/dL — ABNORMAL HIGH (ref 70–99)
Potassium: 3.4 mmol/L — ABNORMAL LOW (ref 3.5–5.1)
Sodium: 143 mmol/L (ref 135–145)
Total Bilirubin: 0.3 mg/dL (ref 0.3–1.2)
Total Protein: 6.5 g/dL (ref 6.5–8.1)

## 2018-02-09 LAB — CBC WITH DIFFERENTIAL (CANCER CENTER ONLY)
Abs Immature Granulocytes: 0 10*3/uL (ref 0.00–0.07)
BASOS PCT: 0 %
Basophils Absolute: 0 10*3/uL (ref 0.0–0.1)
Eosinophils Absolute: 0.1 10*3/uL (ref 0.0–0.5)
Eosinophils Relative: 5 %
HCT: 34 % — ABNORMAL LOW (ref 36.0–46.0)
Hemoglobin: 11.2 g/dL — ABNORMAL LOW (ref 12.0–15.0)
Immature Granulocytes: 0 %
Lymphocytes Relative: 18 %
Lymphs Abs: 0.5 10*3/uL — ABNORMAL LOW (ref 0.7–4.0)
MCH: 31.1 pg (ref 26.0–34.0)
MCHC: 32.9 g/dL (ref 30.0–36.0)
MCV: 94.4 fL (ref 80.0–100.0)
Monocytes Absolute: 0.5 10*3/uL (ref 0.1–1.0)
Monocytes Relative: 17 %
NRBC: 0 % (ref 0.0–0.2)
Neutro Abs: 1.7 10*3/uL (ref 1.7–7.7)
Neutrophils Relative %: 60 %
Platelet Count: 142 10*3/uL — ABNORMAL LOW (ref 150–400)
RBC: 3.6 MIL/uL — ABNORMAL LOW (ref 3.87–5.11)
RDW: 13 % (ref 11.5–15.5)
WBC Count: 2.8 10*3/uL — ABNORMAL LOW (ref 4.0–10.5)

## 2018-02-09 MED ORDER — ACETAMINOPHEN 325 MG PO TABS
ORAL_TABLET | ORAL | Status: AC
  Filled 2018-02-09: qty 2

## 2018-02-09 MED ORDER — FAMOTIDINE IN NACL 20-0.9 MG/50ML-% IV SOLN
INTRAVENOUS | Status: AC
  Filled 2018-02-09: qty 50

## 2018-02-09 MED ORDER — SODIUM CHLORIDE 0.9 % IV SOLN
Freq: Once | INTRAVENOUS | Status: AC
  Administered 2018-02-09: 15:00:00 via INTRAVENOUS
  Filled 2018-02-09: qty 250

## 2018-02-09 MED ORDER — DIPHENHYDRAMINE HCL 25 MG PO CAPS
50.0000 mg | ORAL_CAPSULE | Freq: Once | ORAL | Status: AC
  Administered 2018-02-09: 50 mg via ORAL

## 2018-02-09 MED ORDER — FAMOTIDINE IN NACL 20-0.9 MG/50ML-% IV SOLN
20.0000 mg | Freq: Once | INTRAVENOUS | Status: AC
  Administered 2018-02-09: 20 mg via INTRAVENOUS

## 2018-02-09 MED ORDER — SODIUM CHLORIDE 0.9% FLUSH
10.0000 mL | INTRAVENOUS | Status: DC | PRN
  Administered 2018-02-09: 10 mL
  Filled 2018-02-09: qty 10

## 2018-02-09 MED ORDER — TRASTUZUMAB CHEMO 150 MG IV SOLR
600.0000 mg | Freq: Once | INTRAVENOUS | Status: AC
  Administered 2018-02-09: 600 mg via INTRAVENOUS
  Filled 2018-02-09: qty 28.57

## 2018-02-09 MED ORDER — ACETAMINOPHEN 325 MG PO TABS
650.0000 mg | ORAL_TABLET | Freq: Once | ORAL | Status: AC
  Administered 2018-02-09: 650 mg via ORAL

## 2018-02-09 MED ORDER — METHYLPREDNISOLONE SODIUM SUCC 125 MG IJ SOLR
INTRAMUSCULAR | Status: AC
  Filled 2018-02-09: qty 2

## 2018-02-09 MED ORDER — DIPHENHYDRAMINE HCL 25 MG PO CAPS
ORAL_CAPSULE | ORAL | Status: AC
  Filled 2018-02-09: qty 2

## 2018-02-09 MED ORDER — HEPARIN SOD (PORK) LOCK FLUSH 100 UNIT/ML IV SOLN
500.0000 [IU] | Freq: Once | INTRAVENOUS | Status: AC | PRN
  Administered 2018-02-09: 500 [IU]
  Filled 2018-02-09: qty 5

## 2018-02-09 MED ORDER — METHYLPREDNISOLONE SODIUM SUCC 125 MG IJ SOLR
60.0000 mg | Freq: Once | INTRAMUSCULAR | Status: AC
  Administered 2018-02-09: 60 mg via INTRAVENOUS

## 2018-02-09 NOTE — Patient Instructions (Signed)
[facility] Discharge Instructions for Patients Receiving Chemotherapy  Today you received the following chemotherapy agents Herceptin  To help prevent nausea and vomiting after your treatment, we encourage you to take your nausea medication as directed   If you develop nausea and vomiting that is not controlled by your nausea medication, call the clinic.   BELOW ARE SYMPTOMS THAT SHOULD BE REPORTED IMMEDIATELY:  *FEVER GREATER THAN 100.5 F  *CHILLS WITH OR WITHOUT FEVER  NAUSEA AND VOMITING THAT IS NOT CONTROLLED WITH YOUR NAUSEA MEDICATION  *UNUSUAL SHORTNESS OF BREATH  *UNUSUAL BRUISING OR BLEEDING  TENDERNESS IN MOUTH AND THROAT WITH OR WITHOUT PRESENCE OF ULCERS  *URINARY PROBLEMS  *BOWEL PROBLEMS  UNUSUAL RASH Items with * indicate a potential emergency and should be followed up as soon as possible.  Feel free to call the clinic should you have any questions or concerns. The clinic phone number is [phone].  Please show the CHEMO ALERT CARD at check-in to the Emergency Department and triage nurse.   

## 2018-02-09 NOTE — Telephone Encounter (Signed)
Gave patient avs report and appointments for December thru April. Appointments not scheduled thru October 2020. Patient will get additional appointments at last April f/u.

## 2018-02-10 ENCOUNTER — Encounter (HOSPITAL_COMMUNITY): Payer: Medicare Other

## 2018-02-15 ENCOUNTER — Encounter (HOSPITAL_COMMUNITY): Payer: Medicare Other

## 2018-02-17 ENCOUNTER — Encounter (HOSPITAL_COMMUNITY): Payer: Medicare Other

## 2018-02-18 ENCOUNTER — Other Ambulatory Visit: Payer: Self-pay

## 2018-02-18 DIAGNOSIS — Z5111 Encounter for antineoplastic chemotherapy: Secondary | ICD-10-CM

## 2018-02-18 NOTE — Progress Notes (Signed)
error 

## 2018-02-21 ENCOUNTER — Other Ambulatory Visit (HOSPITAL_COMMUNITY): Payer: Medicare Other

## 2018-02-21 ENCOUNTER — Encounter: Payer: Self-pay | Admitting: Hematology and Oncology

## 2018-02-22 ENCOUNTER — Encounter (HOSPITAL_COMMUNITY): Payer: Medicare Other

## 2018-02-22 ENCOUNTER — Other Ambulatory Visit: Payer: Self-pay | Admitting: Hematology and Oncology

## 2018-02-22 ENCOUNTER — Other Ambulatory Visit: Payer: Self-pay

## 2018-02-22 MED ORDER — ONDANSETRON HCL 8 MG PO TABS: 8.0000 mg | ORAL_TABLET | Freq: Three times a day (TID) | ORAL | 0 refills | Status: DC | PRN

## 2018-02-22 MED ORDER — DIPHENOXYLATE-ATROPINE 2.5-0.025 MG PO TABS: 1.0000 | ORAL_TABLET | Freq: Four times a day (QID) | ORAL | 0 refills | Status: DC | PRN

## 2018-02-22 NOTE — Progress Notes (Signed)
Called pt to discuss her message on mychart regarding her symptoms of diarrhea episodes x 2-3 weeks.  Pt states that she is now having some bleeding with her BM d/t her hemorrhoids. Pt reports hydrating and eating ok, but states " I end up with diarrhea within 15 minutes of eating, every time". Pt states that she has tried imodium several times without any resolution of diarrhea. Per Dr.Gudena, ok to send lomotil for pt to try. Pt also having nausea and unable to eat well. Ok to send zofran, per MD as well. Pt instructed to increase oral fluids and to monitor s/s dehydration, fever, dizziness, and decreased appetite. If she continues to have moderate to severe diarrhea, pt may call to be seen by [facility] clinic tomorrow or by the end of this week. Pt verbalized understanding and has no further questions at this time.

## 2018-02-24 ENCOUNTER — Encounter (HOSPITAL_COMMUNITY): Payer: Medicare Other

## 2018-02-24 NOTE — Addendum Note (Signed)
Encounter addended by: Dwana Melena, RN on: 02/24/2018 3:37 PM  Actions taken: Flowsheet data copied forward, Visit Navigator Flowsheet section accepted, Clinical Note Signed
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
## 2018-02-27 ENCOUNTER — Encounter: Payer: Self-pay | Admitting: Hematology and Oncology

## 2018-02-28 ENCOUNTER — Telehealth: Payer: Self-pay

## 2018-02-28 NOTE — Telephone Encounter (Signed)
Called pt, in response to her mychart message. Pt has now been experiencing severe constipation with abdominal cramping for a few days. Pt stated that the last time she spoke with this RN, a lomotil prescription was sent out due to severe diarrhea. Pt did not take lomotil due to diarrhea stopped. Pt was very nauseous and could not eat, so she started taking zofran on the clock, and became severely constipated.   Pt tried taking OTC laxative and some prune juice with no relief. Told pt to stop taking zofran, and take compazine instead for nausea. Drink plenty of water and try to use an enema tonight. Pt states that she has a fleet enema at home and will try it out. If she continues with no relief or bowel movement, she will need to call tomorrow and notify Dr.Gudena's nurse for further bowel management.   Pt verbalized understanding and will call with updates if she gets no relief. Pt to see Dr.Gudena on 12/26 prior to her herceptin treatment.

## 2018-03-01 ENCOUNTER — Encounter (HOSPITAL_COMMUNITY): Payer: Medicare Other

## 2018-03-03 ENCOUNTER — Inpatient Hospital Stay: Payer: Medicare Other

## 2018-03-03 ENCOUNTER — Inpatient Hospital Stay (HOSPITAL_BASED_OUTPATIENT_CLINIC_OR_DEPARTMENT_OTHER): Payer: Medicare Other | Admitting: Hematology and Oncology

## 2018-03-03 ENCOUNTER — Encounter (HOSPITAL_COMMUNITY): Payer: Medicare Other

## 2018-03-03 VITALS — BP 148/65 | HR 59 | Temp 97.4°F | Resp 18 | Ht 64.0 in | Wt 215.9 lb

## 2018-03-03 DIAGNOSIS — Z95828 Presence of other vascular implants and grafts: Secondary | ICD-10-CM

## 2018-03-03 DIAGNOSIS — Z170 Estrogen receptor positive status [ER+]: Principal | ICD-10-CM

## 2018-03-03 DIAGNOSIS — C50412 Malignant neoplasm of upper-outer quadrant of left female breast: Secondary | ICD-10-CM

## 2018-03-03 DIAGNOSIS — Z9012 Acquired absence of left breast and nipple: Secondary | ICD-10-CM

## 2018-03-03 DIAGNOSIS — Z9221 Personal history of antineoplastic chemotherapy: Secondary | ICD-10-CM | POA: Diagnosis not present

## 2018-03-03 DIAGNOSIS — Z5112 Encounter for antineoplastic immunotherapy: Secondary | ICD-10-CM | POA: Diagnosis not present

## 2018-03-03 DIAGNOSIS — Z79899 Other long term (current) drug therapy: Secondary | ICD-10-CM

## 2018-03-03 LAB — CBC WITH DIFFERENTIAL (CANCER CENTER ONLY)
Abs Immature Granulocytes: 0.01 10*3/uL (ref 0.00–0.07)
Basophils Absolute: 0 10*3/uL (ref 0.0–0.1)
Basophils Relative: 1 %
EOS ABS: 0.1 10*3/uL (ref 0.0–0.5)
EOS PCT: 5 %
HCT: 36.2 % (ref 36.0–46.0)
Hemoglobin: 11.8 g/dL — ABNORMAL LOW (ref 12.0–15.0)
Immature Granulocytes: 0 %
Lymphocytes Relative: 22 %
Lymphs Abs: 0.7 10*3/uL (ref 0.7–4.0)
MCH: 30.8 pg (ref 26.0–34.0)
MCHC: 32.6 g/dL (ref 30.0–36.0)
MCV: 94.5 fL (ref 80.0–100.0)
MONO ABS: 0.6 10*3/uL (ref 0.1–1.0)
Monocytes Relative: 18 %
Neutro Abs: 1.7 10*3/uL (ref 1.7–7.7)
Neutrophils Relative %: 54 %
Platelet Count: 164 10*3/uL (ref 150–400)
RBC: 3.83 MIL/uL — ABNORMAL LOW (ref 3.87–5.11)
RDW: 13.2 % (ref 11.5–15.5)
WBC Count: 3.1 10*3/uL — ABNORMAL LOW (ref 4.0–10.5)
nRBC: 0 % (ref 0.0–0.2)

## 2018-03-03 LAB — CMP (CANCER CENTER ONLY)
ALT: 31 U/L (ref 0–44)
AST: 35 U/L (ref 15–41)
Albumin: 3.6 g/dL (ref 3.5–5.0)
Alkaline Phosphatase: 86 U/L (ref 38–126)
Anion gap: 8 (ref 5–15)
BILIRUBIN TOTAL: 0.4 mg/dL (ref 0.3–1.2)
BUN: 8 mg/dL (ref 8–23)
CO2: 33 mmol/L — ABNORMAL HIGH (ref 22–32)
Calcium: 9.2 mg/dL (ref 8.9–10.3)
Chloride: 101 mmol/L (ref 98–111)
Creatinine: 0.71 mg/dL (ref 0.44–1.00)
GFR, Est AFR Am: >60 mL/min (ref >60)
GFR, Estimated: >60 mL/min (ref >60)
Glucose, Bld: 94 mg/dL (ref 70–99)
Potassium: 3.6 mmol/L (ref 3.5–5.1)
Sodium: 142 mmol/L (ref 135–145)
Total Protein: 6.7 g/dL (ref 6.5–8.1)

## 2018-03-03 MED ORDER — DIPHENHYDRAMINE HCL 25 MG PO CAPS
50.0000 mg | ORAL_CAPSULE | Freq: Once | ORAL | Status: AC
  Administered 2018-03-03: 50 mg via ORAL

## 2018-03-03 MED ORDER — ACETAMINOPHEN 325 MG PO TABS
ORAL_TABLET | ORAL | Status: AC
  Filled 2018-03-03: qty 2

## 2018-03-03 MED ORDER — HEPARIN SOD (PORK) LOCK FLUSH 100 UNIT/ML IV SOLN
500.0000 [IU] | Freq: Once | INTRAVENOUS | Status: AC | PRN
  Administered 2018-03-03: 500 [IU]
  Filled 2018-03-03: qty 5

## 2018-03-03 MED ORDER — SODIUM CHLORIDE 0.9% FLUSH
10.0000 mL | INTRAVENOUS | Status: DC | PRN
  Administered 2018-03-03: 10 mL
  Filled 2018-03-03: qty 10

## 2018-03-03 MED ORDER — TRASTUZUMAB CHEMO 150 MG IV SOLR
600.0000 mg | Freq: Once | INTRAVENOUS | Status: AC
  Administered 2018-03-03: 600 mg via INTRAVENOUS
  Filled 2018-03-03: qty 28.57

## 2018-03-03 MED ORDER — METHYLPREDNISOLONE SODIUM SUCC 125 MG IJ SOLR
INTRAMUSCULAR | Status: AC
  Filled 2018-03-03: qty 2

## 2018-03-03 MED ORDER — FAMOTIDINE IN NACL 20-0.9 MG/50ML-% IV SOLN
20.0000 mg | Freq: Once | INTRAVENOUS | Status: AC
  Administered 2018-03-03: 20 mg via INTRAVENOUS

## 2018-03-03 MED ORDER — METHYLPREDNISOLONE SODIUM SUCC 125 MG IJ SOLR
60.0000 mg | Freq: Once | INTRAMUSCULAR | Status: AC
  Administered 2018-03-03: 60 mg via INTRAVENOUS

## 2018-03-03 MED ORDER — ACETAMINOPHEN 325 MG PO TABS
650.0000 mg | ORAL_TABLET | Freq: Once | ORAL | Status: AC
  Administered 2018-03-03: 650 mg via ORAL

## 2018-03-03 MED ORDER — DIPHENHYDRAMINE HCL 25 MG PO CAPS
ORAL_CAPSULE | ORAL | Status: AC
  Filled 2018-03-03: qty 2

## 2018-03-03 MED ORDER — FAMOTIDINE IN NACL 20-0.9 MG/50ML-% IV SOLN
INTRAVENOUS | Status: AC
  Filled 2018-03-03: qty 50

## 2018-03-03 MED ORDER — SODIUM CHLORIDE 0.9 % IV SOLN
Freq: Once | INTRAVENOUS | Status: AC
  Administered 2018-03-03: 16:00:00 via INTRAVENOUS
  Filled 2018-03-03: qty 250

## 2018-03-03 NOTE — Assessment & Plan Note (Signed)
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, potential benefit of treatment at 5 years: 94.6% distant metastasis free interval for patients treated with chemotherapy  Recommendation: 1.Adjuvant chemotherapy with dose dense Adriamycin and Cytoxan x4 followed by Taxol weekly x6 (stopped for neuropathy)07/23/2017 to 11/10/2017 Herceptin adjuvant treatment started 12/29/2017 2.followed by radiation [place]: 3.Followed by adjuvant antiestrogen therapy with letrozole 2.5 mg daily x5 to 7 years ----------------------------------------------------------------------- Repeat assessment revealed HER-2 negative focus but also HER-2 positivefocus making her tumor heterogeneous.  Current treatment: Herceptin maintenance therapy Herceptin toxicities: 1.  Initial infusion reaction: Severe chills and abdominal pain radiating to the back and a flushing sensation  Follow-up every 3 weeks for Herceptin every 6 weeks with labs and follow-up with me

## 2018-03-03 NOTE — Patient Instructions (Signed)
[facility] Discharge Instructions for Patients Receiving Chemotherapy  Today you received the following chemotherapy agents Herceptin  To help prevent nausea and vomiting after your treatment, we encourage you to take your nausea medication as directed   If you develop nausea and vomiting that is not controlled by your nausea medication, call the clinic.   BELOW ARE SYMPTOMS THAT SHOULD BE REPORTED IMMEDIATELY:  *FEVER GREATER THAN 100.5 F  *CHILLS WITH OR WITHOUT FEVER  NAUSEA AND VOMITING THAT IS NOT CONTROLLED WITH YOUR NAUSEA MEDICATION  *UNUSUAL SHORTNESS OF BREATH  *UNUSUAL BRUISING OR BLEEDING  TENDERNESS IN MOUTH AND THROAT WITH OR WITHOUT PRESENCE OF ULCERS  *URINARY PROBLEMS  *BOWEL PROBLEMS  UNUSUAL RASH Items with * indicate a potential emergency and should be followed up as soon as possible.  Feel free to call the clinic should you have any questions or concerns. The clinic phone number is [phone].  Please show the CHEMO ALERT CARD at check-in to the Emergency Department and triage nurse.   

## 2018-03-03 NOTE — Progress Notes (Signed)
Patient Care Team: Sherrilee Gilles, DO as PCP - General (Family Medicine)  DIAGNOSIS:  Encounter Diagnosis  Name Primary?  . Malignant neoplasm of upper-outer quadrant of left breast in female, estrogen receptor positive ([place])     SUMMARY OF ONCOLOGIC HISTORY:   Malignant neoplasm of upper-outer quadrant of left breast in female, estrogen receptor positive ([place])   05/13/2017 Initial Diagnosis    [place] biopsy: Left breast biopsy 2 o'clock position 8 cm from nipple ultrasound-guided biopsy: Grade 2 IDC, ER 50%, PR 3%, HER-2 equivocal Ki-67 15% nipple area biopsy grade 2 IDC, ER 100% positive, PR negative, HER-2 equivocal, Ki-67 40%    05/31/2017 Breast MRI    Left breast UOQ 230 position: 2 x 1.9 x 1.9 cm mass, second mass left breast UOQ 230 position: 1 x 1 x 0.7 cm, 2 more enhancing adjacent 3 and 5 mm nodules slightly superiorly midway between the 2 masses.  No abnormal lymph nodes, right breast normal    06/11/2017 Surgery    Left Mastectomy: 2 foci of IDC 2.2 and 1 cm Grade 2, 1/2 LN Positive; Grade 2 IDC, ER 50%, PR 3%, HER-2 equivocal Ki-67 15% nipple area biopsy grade 2 IDC, ER 100% positive, PR negative, HER-2 equivocal, Ki-67 40% T2N1a Stage 1B    06/25/2017 Miscellaneous    Mammaprint high risk luminal type B    07/23/2017 - 11/10/2017 Chemotherapy    Adjuvant chemotherapy with dose dense Adriamycin and Cytoxan x4 followed by Taxol weekly x6     12/10/2017 Pathology Results    Repeat analysis of pathology confirmed that patient has heterogeneous HER-2 signaling.  There is equivocal IHC 2+ and fish negative areas where the ratio was 1.49 and copy number of 2.9.  There is one area that is HER-2 +3+ by [facility]    12/29/2017 -  Chemotherapy    The patient had trastuzumab (HERCEPTIN) 798 mg in sodium chloride 0.9 % 250 mL chemo infusion, 8 mg/kg = 798 mg, Intravenous,  Once, 3 of 18 cycles Administration: 798 mg (12/29/2017), 588 mg (01/19/2018), 600 mg (02/09/2018)  for  chemotherapy treatment.      CHIEF COMPLIANT: Severe pain in the lower back as well as abdomen and between her shoulder blades, Herceptin maintenance  INTERVAL HISTORY: Breanna [name] is a 72 year old above-mentioned history of HER-2 positive breast cancer who is currently on Herceptin maintenance therapy.  She has had intermittent diarrhea ever since we started treatment.  However she goes from diarrhea to constipation alternatingly.  Her major complaint is severe pain in the lower back and between the shoulder blades.  She attributes this to radiation therapy.  She does have mild nausea but no vomiting.  She uses oxygen by nasal cannula 24 x 7. 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  It appears that it is improving slowly over time.  REVIEW OF SYSTEMS:   Constitutional: Denies fevers, chills or abnormal weight loss Eyes: Denies blurriness of vision Ears, nose, mouth, throat, and face: Denies mucositis or sore throat Respiratory: Denies cough, dyspnea or wheezes Cardiovascular: Denies palpitation, chest discomfort Gastrointestinal:  Denies nausea, heartburn or change in bowel habits Skin: Denies abnormal skin rashes Lymphatics: Denies new lymphadenopathy or easy bruising Neurological:Denies numbness, tingling or new weaknesses Behavioral/Psych: Mood is stable, no new changes  Extremities: No lower extremity edema  All other systems were reviewed with the patient and are negative.  I have reviewed the past  medical history, past surgical history, social history and family history with the patient and they are unchanged from previous note.  ALLERGIES:  is allergic to atorvastatin; vancomycin; ancef [cefazolin]; lactose; nasonex [mometasone furoate]; and neurontin [gabapentin].  MEDICATIONS:  Current Outpatient Medications  Medication Sig Dispense Refill  . acetaminophen (TYLENOL) 500 MG tablet Take 500 mg by  mouth 2 (two) times daily as needed for moderate pain or headache.     Marland Kitchen amLODipine (NORVASC) 5 MG tablet Take 5 mg by mouth every evening.     . B Complex-C (SUPER B COMPLEX PO) Take 1 tablet by mouth daily.    . bumetanide (BUMEX) 1 MG tablet Take 1 tablet (1 mg total) by mouth daily. 30 tablet 3  . carvedilol (COREG) 25 MG tablet Take 25 mg by mouth 2 (two) times daily with a meal.    . cetirizine (ZYRTEC) 10 MG tablet Take 10 mg by mouth daily.     . clopidogrel (PLAVIX) 75 MG tablet Take 75 mg by mouth every evening.     . diclofenac sodium (VOLTAREN) 1 % GEL Apply 4 g topically at bedtime. 100 g 6  . diphenoxylate-atropine (LOMOTIL) 2.5-0.025 MG tablet Take 1 tablet by mouth 4 (four) times daily as needed for diarrhea or loose stools. 30 tablet 0  . ferrous sulfate 325 (65 FE) MG tablet Take 325 mg by mouth daily with breakfast.    . fluticasone (FLONASE) 50 MCG/ACT nasal spray Place 1 spray into both nostrils daily as needed for allergies or rhinitis.    Marland Kitchen lansoprazole (PREVACID) 15 MG capsule Take 15 mg by mouth daily.     Marland Kitchen lidocaine-prilocaine (EMLA) cream Apply to affected area once (Patient not taking: Reported on 12/20/2017) 30 g 3  . lisinopril (PRINIVIL,ZESTRIL) 40 MG tablet Take 40 mg by mouth daily.    . montelukast (SINGULAIR) 10 MG tablet Take 10 mg by mouth at bedtime.    . ondansetron (ZOFRAN) 8 MG tablet Take 1 tablet (8 mg total) by mouth every 8 (eight) hours as needed for nausea or vomiting. 20 tablet 0  . PARoxetine (PAXIL) 10 MG tablet Take 10 mg by mouth every evening.    Vladimir Faster Glycol-Propyl Glycol (SYSTANE OP) Place 1 drop into both eyes daily as needed (for dry eyes).     . potassium chloride (K-DUR,KLOR-CON) 10 MEQ tablet Take 10 mEq by mouth every evening.     . traMADol (ULTRAM) 50 MG tablet Take 1 tablet (50 mg total) by mouth 2 (two) times daily as needed for moderate pain (mild pain). 20 tablet 0  . venlafaxine XR (EFFEXOR-XR) 37.5 MG 24 hr capsule Take  37.5 mg by mouth at bedtime.     No current facility-administered medications for this visit.    Facility-Administered Medications Ordered in Other Visits  Medication Dose Route Frequency Provider Last Rate Last Dose  . sodium chloride flush (NS) 0.9 % injection 10 mL  10 mL Intracatheter PRN Nicholas Lose, MD   10 mL at 03/03/18 1359    PHYSICAL EXAMINATION: ECOG PERFORMANCE STATUS: 1 - Symptomatic but completely ambulatory  Vitals:   03/03/18 1504  BP: (!) 148/65  Pulse: (!) 59  Resp: 18  Temp: (!) 97.4 F (36.3 C)  SpO2: 92%   Filed Weights   03/03/18 1504  Weight: 215 lb 14.4 oz (97.9 kg)    GENERAL:alert, no distress and comfortable SKIN: skin color, texture, turgor are normal, no rashes or significant lesions EYES: normal, Conjunctiva are  pink and non-injected, sclera clear OROPHARYNX:no exudate, no erythema and lips, buccal mucosa, and tongue normal  NECK: supple, thyroid normal size, non-tender, without nodularity LYMPH:  no palpable lymphadenopathy in the cervical, axillary or inguinal LUNGS: clear to auscultation and percussion with normal breathing effort HEART: regular rate & rhythm and no murmurs and no lower extremity edema ABDOMEN:abdomen soft, non-tender and normal bowel sounds MUSCULOSKELETAL:no cyanosis of digits and no clubbing  NEURO: alert & oriented x 3 with fluent speech, no focal motor/sensory deficits EXTREMITIES: No lower extremity edema   LABORATORY DATA:  I have reviewed the data as listed CMP Latest Ref Rng & Units 03/03/2018 02/09/2018 01/19/2018  Glucose 70 - 99 mg/dL 94 142(H) 104(H)  BUN 8 - 23 mg/dL _0 Creatinine 0.44 - 1.00 mg/dL 0.71 0.71 0.69  Sodium 135 - 145 mmol/L 142 143 142  Potassium 3.5 - 5.1 mmol/L 3.6 3.4(L) 3.6  Chloride 98 - 111 mmol/L 101 101 103  CO2 22 - 32 mmol/L 33(H) 35(H) 33(H)  Calcium 8.9 - 10.3 mg/dL 9.2 9.1 9.1  Total Protein 6.5 - 8.1 g/dL 6.7 6.5 6.4(L)  Total Bilirubin 0.3 - 1.2 mg/dL 0.4 0.3 0.3    Alkaline Phos 38 - 126 U/L 86 82 84  AST 15 - 41 U/L 35 31 31  ALT 0 - 44 U/L _1 Lab Results  Component Value Date   WBC 3.1 (L) 03/03/2018   HGB 11.8 (L) 03/03/2018   HCT 36.2 03/03/2018   MCV 94.5 03/03/2018   PLT 164 03/03/2018   NEUTROABS 1.7 03/03/2018    ASSESSMENT & PLAN:  Malignant neoplasm of upper-outer quadrant of left breast in female, estrogen receptor positive ([place]) 05/13/17: [place] biopsy: Left breast biopsy 2 o'clock position 8 cm from nipple ultrasound-guided biopsy: Grade 2 IDC, ER 50%, PR 3%, HER-2 equivocal Ki-67 15% nipple area biopsy grade 2 IDC, ER 100% positive, PR negative, HER-2 equivocal, Ki-67 40%  06/11/17:Left Mastectomy: 2 foci of IDC 2.2 and 1 cm Grade 2, 1/2 LN Positive; Grade 2 IDC, ER 50%, PR 3%, HER-2 equivocal Ki-67 15% nipple area biopsy grade 2 IDC, ER 100% positive, PR negative, HER-2 equivocal, Ki-67 40% T2N1a Stage 1B I discussed with her that given the fact that she has lymph node positive disease, this is clinically high risk disease.  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, potential benefit of treatment at 5 years: 94.6% distant metastasis free interval for patients treated with chemotherapy  Recommendation: 1.Adjuvant chemotherapy with dose dense Adriamycin and Cytoxan x4 followed by Taxol weekly x6 (stopped for neuropathy)07/23/2017 to 11/10/2017 Herceptin adjuvant treatment started 12/29/2017 2.followed by radiation [place]: Completed 02/22/2018 3.Followed by adjuvant antiestrogen therapy with letrozole 2.5 mg daily x5 to 7 years ----------------------------------------------------------------------- Repeat assessment revealed HER-2 negative focus but also HER-2 positivefocus making her tumor heterogeneous.  Current treatment: Herceptin maintenance therapy Herceptin toxicities: 1.  Initial infusion reaction: Severe chills and abdominal pain radiating to the back and a flushing sensation 2.  Abdominal pain  and alternating diarrhea with constipation: Encouraged to take probiotics   Follow-up every 3 weeks for Herceptin every 6 weeks with labs and follow-up with me.  The next time I see her I will discuss with her about starting her on antiestrogen therapy with letrozole.    No orders of the defined types were placed in this encounter.  The patient has a good understanding of the overall plan. she agrees with it. she will call with  any problems that may develop before the next visit here.   Harriette Ohara, MD 03/03/18

## 2018-03-08 ENCOUNTER — Encounter (HOSPITAL_COMMUNITY): Payer: Medicare Other

## 2018-03-10 ENCOUNTER — Encounter (HOSPITAL_COMMUNITY): Payer: Medicare Other

## 2018-03-15 ENCOUNTER — Encounter: Payer: Self-pay | Admitting: Adult Health

## 2018-03-15 ENCOUNTER — Encounter (HOSPITAL_COMMUNITY): Payer: Medicare Other

## 2018-03-17 ENCOUNTER — Encounter (HOSPITAL_COMMUNITY): Payer: Medicare Other

## 2018-03-17 ENCOUNTER — Other Ambulatory Visit: Payer: Self-pay

## 2018-03-17 MED ORDER — CLOPIDOGREL BISULFATE 75 MG PO TABS: 75.0000 mg | ORAL_TABLET | Freq: Every evening | ORAL | 0 refills | Status: AC

## 2018-03-17 MED ORDER — CARVEDILOL 25 MG PO TABS: 25.0000 mg | ORAL_TABLET | Freq: Two times a day (BID) | ORAL | 0 refills | Status: AC
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
## 2018-03-17 NOTE — Addendum Note (Signed)
Encounter addended by: Dwana Melena, RN on: 03/17/2018 3:03 PM  Actions taken: Flowsheet data copied forward, Visit Navigator Flowsheet section accepted, Clinical Note Signed

## 2018-03-17 NOTE — Addendum Note (Signed)
Encounter addended by: Dwana Melena, RN on: 03/17/2018 3:05 PM  Actions taken: Clinical Note Signed

## 2018-03-17 NOTE — Telephone Encounter (Signed)
Pt called to request if Dr.Gudena would refill her carvedilol medication and plavix. She is out and is in transition with establishing a new primary care dr. Her PCP will not refill medication until her appt for a physical. Per MD, okay to fill as courtesy x1 month. Pt very appreciative and thankful for the call.

## 2018-03-21 ENCOUNTER — Encounter: Payer: Self-pay | Admitting: Adult Health

## 2018-03-22 ENCOUNTER — Encounter (HOSPITAL_COMMUNITY): Payer: Medicare Other

## 2018-03-22 NOTE — Progress Notes (Signed)
Patient Care Team: [name], MD as PCP - General (Family Medicine)  DIAGNOSIS:    ICD-10-CM   1. Malignant neoplasm of upper-outer quadrant of left breast in female, estrogen receptor positive ([place]) C50.412    Z17.0     SUMMARY OF ONCOLOGIC HISTORY:   Malignant neoplasm of upper-outer quadrant of left breast in female, estrogen receptor positive ([place])   05/13/2017 Initial Diagnosis    [place] biopsy: Left breast biopsy 2 o'clock position 8 cm from nipple ultrasound-guided biopsy: Grade 2 IDC, ER 50%, PR 3%, HER-2 equivocal Ki-67 15% nipple area biopsy grade 2 IDC, ER 100% positive, PR negative, HER-2 equivocal, Ki-67 40%    05/31/2017 Breast MRI    Left breast UOQ 230 position: 2 x 1.9 x 1.9 cm mass, second mass left breast UOQ 230 position: 1 x 1 x 0.7 cm, 2 more enhancing adjacent 3 and 5 mm nodules slightly superiorly midway between the 2 masses.  No abnormal lymph nodes, right breast normal    06/11/2017 Surgery    Left Mastectomy: 2 foci of IDC 2.2 and 1 cm Grade 2, 1/2 LN Positive; Grade 2 IDC, ER 50%, PR 3%, HER-2 equivocal Ki-67 15% nipple area biopsy grade 2 IDC, ER 100% positive, PR negative, HER-2 equivocal, Ki-67 40% T2N1a Stage 1B    06/25/2017 Miscellaneous    Mammaprint high risk luminal type B    07/23/2017 - 11/10/2017 Chemotherapy    Adjuvant chemotherapy with dose dense Adriamycin and Cytoxan x4 followed by Taxol weekly x6     12/10/2017 Pathology Results    Repeat analysis of pathology confirmed that patient has heterogeneous HER-2 signaling.  There is equivocal IHC 2+ and fish negative areas where the ratio was 1.49 and copy number of 2.9.  There is one area that is HER-2 +3+ by [facility]    12/28/2017 - 02/22/2018 Radiation Therapy    Adj XRT at [facility]    12/29/2017 -  Chemotherapy    The patient had trastuzumab (HERCEPTIN) 798 mg in sodium chloride 0.9 % 250 mL chemo infusion, 8 mg/kg = 798 mg, Intravenous,  Once, 4 of 18 cycles Administration: 798  mg (12/29/2017), 588 mg (01/19/2018), 600 mg (02/09/2018), 600 mg (03/03/2018)  for chemotherapy treatment.      CHIEF COMPLIANT: Follow-up of Herceptin maintenance and to begin anti-estrogen therapy  INTERVAL HISTORY: Breanna [name] is a 73 y.o. with above-mentioned history of HER-2 positive breast cancer who is currently on Herceptin maintenance therapy. She presents to the clinic today with her husband to discuss anti-estrogen therapy. She reports she is trying to use oxygen by nasal canula less and is feeling great. Her fatigue has greatly improved since the end of radiation. She reports hot flashes 3-4 times per day. She reports severe back pain and soreness under her left arm at the radiation site.  Diarrhea is still present but has moderately improved. Her lab work from today is WNL. She reports she has a new PCP, Dr. [name], who she likes very much.   REVIEW OF SYSTEMS:   Constitutional: Denies fevers, chills or abnormal weight loss (+) hot flashes 3-4x daily Eyes: Denies blurriness of vision Ears, nose, mouth, throat, and face: Denies mucositis or sore throat Respiratory: Denies cough or wheezes (+) dyspnea on exertion , patient is no longer using oxygen Cardiovascular: Denies palpitation, chest discomfort Gastrointestinal:  Denies nausea, heartburn (+) diarrhea Skin: Denies abnormal skin rashes MSK: (+) severe back pain Lymphatics: Denies new lymphadenopathy or easy bruising  Neurological: Denies numbness, tingling or new weaknesses Behavioral/Psych: Mood is stable, no new changes  Extremities: No lower extremity edema Breast: denies any lumps or nodules in either breasts (+) pain at radiation site All other systems were reviewed with the patient and are negative.  I have reviewed the past medical history, past surgical history, social history and family history with the patient and they are unchanged from previous note.  ALLERGIES:  is allergic to atorvastatin;  vancomycin; ancef [cefazolin]; lactose; nasonex [mometasone furoate]; and neurontin [gabapentin].  MEDICATIONS:  Current Outpatient Medications  Medication Sig Dispense Refill  . acetaminophen (TYLENOL) 500 MG tablet Take 500 mg by mouth 2 (two) times daily as needed for moderate pain or headache.     Marland Kitchen amLODipine (NORVASC) 5 MG tablet Take 5 mg by mouth every evening.     Marland Kitchen anastrozole (ARIMIDEX) 1 MG tablet Take 1 tablet (1 mg total) by mouth daily. 90 tablet 3  . B Complex-C (SUPER B COMPLEX PO) Take 1 tablet by mouth daily.    . bumetanide (BUMEX) 1 MG tablet Take 1 tablet (1 mg total) by mouth daily. 30 tablet 3  . carvedilol (COREG) 25 MG tablet Take 1 tablet (25 mg total) by mouth 2 (two) times daily with a meal. 60 tablet 0  . cetirizine (ZYRTEC) 10 MG tablet Take 10 mg by mouth daily.     . clopidogrel (PLAVIX) 75 MG tablet Take 1 tablet (75 mg total) by mouth every evening. 30 tablet 0  . diclofenac sodium (VOLTAREN) 1 % GEL Apply 4 g topically at bedtime. 100 g 6  . diphenoxylate-atropine (LOMOTIL) 2.5-0.025 MG tablet Take 1 tablet by mouth 4 (four) times daily as needed for diarrhea or loose stools. 30 tablet 0  . ferrous sulfate 325 (65 FE) MG tablet Take 325 mg by mouth daily with breakfast.    . fluticasone (FLONASE) 50 MCG/ACT nasal spray Place 1 spray into both nostrils daily as needed for allergies or rhinitis.    Marland Kitchen lansoprazole (PREVACID) 15 MG capsule Take 15 mg by mouth daily.     Marland Kitchen lidocaine-prilocaine (EMLA) cream Apply to affected area once (Patient not taking: Reported on 12/20/2017) 30 g 3  . lisinopril (PRINIVIL,ZESTRIL) 40 MG tablet Take 40 mg by mouth daily.    . montelukast (SINGULAIR) 10 MG tablet Take 10 mg by mouth at bedtime.    . ondansetron (ZOFRAN) 8 MG tablet Take 1 tablet (8 mg total) by mouth every 8 (eight) hours as needed for nausea or vomiting. 20 tablet 0  . PARoxetine (PAXIL) 10 MG tablet Take 10 mg by mouth every evening.    Vladimir Faster  Glycol-Propyl Glycol (SYSTANE OP) Place 1 drop into both eyes daily as needed (for dry eyes).     . potassium chloride (K-DUR,KLOR-CON) 10 MEQ tablet Take 10 mEq by mouth every evening.     . traMADol (ULTRAM) 50 MG tablet Take 1 tablet (50 mg total) by mouth 2 (two) times daily as needed for moderate pain (mild pain). 20 tablet 0  . venlafaxine XR (EFFEXOR-XR) 37.5 MG 24 hr capsule Take 37.5 mg by mouth at bedtime.     No current facility-administered medications for this visit.    Facility-Administered Medications Ordered in Other Visits  Medication Dose Route Frequency Provider Last Rate Last Dose  . sodium chloride flush (NS) 0.9 % injection 10 mL  10 mL Intracatheter PRN Nicholas Lose, MD   10 mL at 03/23/18 1330    PHYSICAL  EXAMINATION: ECOG PERFORMANCE STATUS: 1 - Symptomatic but completely ambulatory  Vitals:   03/23/18 1419  BP: (!) 123/55  Pulse: 60  Resp: 18  Temp: 98.2 F (36.8 C)  SpO2: 94%   Filed Weights   03/23/18 1419  Weight: 215 lb 14.4 oz (97.9 kg)    GENERAL: alert, no distress and comfortable SKIN: skin color, texture, turgor are normal, no rashes or significant lesions EYES: normal, Conjunctiva are pink and non-injected, sclera clear OROPHARYNX: no exudate, no erythema and lips, buccal mucosa, and tongue normal  NECK: supple, thyroid normal size, non-tender, without nodularity LYMPH: no palpable lymphadenopathy in the cervical, axillary or inguinal LUNGS: clear to auscultation and percussion with normal breathing effort HEART: regular rate & rhythm and no murmurs and no lower extremity edema ABDOMEN: abdomen soft, non-tender and normal bowel sounds MUSCULOSKELETAL: no cyanosis of digits and no clubbing  NEURO: alert & oriented x 3 with fluent speech, no focal motor/sensory deficits EXTREMITIES: No lower extremity edema  LABORATORY DATA:  I have reviewed the data as listed CMP Latest Ref Rng & Units 03/23/2018 03/03/2018 02/09/2018  Glucose 70 - 99  mg/dL 122(H) 94 142(H)  BUN 8 - 23 mg/dL _0 Creatinine 0.44 - 1.00 mg/dL 0.76 0.71 0.71  Sodium 135 - 145 mmol/L 143 142 143  Potassium 3.5 - 5.1 mmol/L 3.7 3.6 3.4(L)  Chloride 98 - 111 mmol/L 104 101 101  CO2 22 - 32 mmol/L 32 33(H) 35(H)  Calcium 8.9 - 10.3 mg/dL 8.8(L) 9.2 9.1  Total Protein 6.5 - 8.1 g/dL 6.5 6.7 6.5  Total Bilirubin 0.3 - 1.2 mg/dL 0.4 0.4 0.3  Alkaline Phos 38 - 126 U/L 87 86 82  AST 15 - 41 U/L 40 35 31  ALT 0 - 44 U/L 42 31 28    Lab Results  Component Value Date   WBC 2.8 (L) 03/23/2018   HGB 12.1 03/23/2018   HCT 36.1 03/23/2018   MCV 93.8 03/23/2018   PLT 148 (L) 03/23/2018   NEUTROABS 1.6 (L) 03/23/2018    ASSESSMENT & PLAN:  Malignant neoplasm of upper-outer quadrant of left breast in female, estrogen receptor positive ([place]) 05/13/17: [place] biopsy: Left breast biopsy 2 o'clock position 8 cm from nipple ultrasound-guided biopsy: Grade 2 IDC, ER 50%, PR 3%, HER-2 equivocal Ki-67 15% nipple area biopsy grade 2 IDC, ER 100% positive, PR negative, HER-2 equivocal, Ki-67 40%  06/11/17:Left Mastectomy: 2 foci of IDC 2.2 and 1 cm Grade 2, 1/2 LN Positive; Grade 2 IDC, ER 50%, PR 3%, HER-2 equivocal Ki-67 15% nipple area biopsy grade 2 IDC, ER 100% positive, PR negative, HER-2 equivocal, Ki-67 40% T2N1a Stage 1B I discussed with her that given the fact that she has lymph node positive disease, this is clinically high risk disease.  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, potential benefit of treatment at 5 years: 94.6% distant metastasis free interval for patients treated with chemotherapy  Recommendation: 1.Adjuvant chemotherapy with dose dense Adriamycin and Cytoxan x4 followed by Taxol weekly x6 (stopped for neuropathy)07/23/2017 to 11/10/2017 Herceptin adjuvant treatment started 12/29/2017 2.followed by radiation [place]: Completed 02/22/2018 3.Followed by adjuvant antiestrogen therapy with letrozole 2.5 mg daily x5 to 7  years ----------------------------------------------------------------------- Repeat assessment revealed HER-2 negative focus but also HER-2 positivefocus making her tumor heterogeneous.  Current treatment: Herceptin maintenance therapy, anastrozole started 03/23/2018 Herceptin toxicities: 1.  Initial infusion reaction: Severe chills and abdominal pain radiating to the back and a flushing sensation 2.  Abdominal  pain and alternating diarrhea with constipation: Encouraged to take probiotics    Anastrozole counseling: We discussed the risks and benefits of anti-estrogen therapy with aromatase inhibitors. These include but not limited to insomnia, hot flashes, mood changes, vaginal dryness, bone density loss, and weight gain. We strongly believe that the benefits far outweigh the risks. Patient understands these risks and consented to starting treatment. Planned treatment duration is 5-7 years.  Follow-up every 3 weeks for Herceptin every 6 weeks with labs and follow-up with me.      No orders of the defined types were placed in this encounter.  The patient has a good understanding of the overall plan. she agrees with it. she will call with any problems that may develop before the next visit here.  Nicholas Lose, MD 03/23/2018  Breanna [name] am acting as scribe for Dr. Nicholas Lose.  I have reviewed the above documentation for accuracy and completeness, and I agree with the above.

## 2018-03-22 NOTE — Assessment & Plan Note (Addendum)
05/13/17: [place] biopsy: Left breast biopsy 2 o'clock position 8 cm from nipple ultrasound-guided biopsy: Grade 2 IDC, ER 50%, PR 3%, HER-2 equivocal Ki-67 15% nipple area biopsy grade 2 IDC, ER 100% positive, PR negative, HER-2 equivocal, Ki-67 40%  06/11/17:Left Mastectomy: 2 foci of IDC 2.2 and 1 cm Grade 2, 1/2 LN Positive; Grade 2 IDC, ER 50%, PR 3%, HER-2 equivocal Ki-67 15% nipple area biopsy grade 2 IDC, ER 100% positive, PR negative, HER-2 equivocal, Ki-67 40% T2N1a Stage 1B I discussed with her that given the fact that she has lymph node positive disease, this is clinically high risk disease.  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, potential benefit of treatment at 5 years: 94.6% distant metastasis free interval for patients treated with chemotherapy  Recommendation: 1.Adjuvant chemotherapy with dose dense Adriamycin and Cytoxan x4 followed by Taxol weekly x6 (stopped for neuropathy)07/23/2017 to 11/10/2017 Herceptin adjuvant treatment started 12/29/2017 2.followed by radiation [place]: Completed 02/22/2018 3.Followed by adjuvant antiestrogen therapy with letrozole 2.5 mg daily x5 to 7 years ----------------------------------------------------------------------- Repeat assessment revealed HER-2 negative focus but also HER-2 positivefocus making her tumor heterogeneous.  Current treatment: Herceptin maintenance therapy, anastrozole started 03/23/2018 Herceptin toxicities: 1.  Initial infusion reaction: Severe chills and abdominal pain radiating to the back and a flushing sensation 2.  Abdominal pain and alternating diarrhea with constipation: Encouraged to take probiotics    Anastrozole counseling: We discussed the risks and benefits of anti-estrogen therapy with aromatase inhibitors. These include but not limited to insomnia, hot flashes, mood changes, vaginal dryness, bone density loss, and weight gain. We strongly believe that the benefits far outweigh the risks.  Patient understands these risks and consented to starting treatment. Planned treatment duration is 5-7 years.  Follow-up every 3 weeks for Herceptin every 6 weeks with labs and follow-up with me.   

## 2018-03-23 ENCOUNTER — Inpatient Hospital Stay: Payer: Medicare Other

## 2018-03-23 ENCOUNTER — Inpatient Hospital Stay: Payer: Medicare Other | Attending: Hematology and Oncology | Admitting: Hematology and Oncology

## 2018-03-23 DIAGNOSIS — Z170 Estrogen receptor positive status [ER+]: Principal | ICD-10-CM

## 2018-03-23 DIAGNOSIS — R197 Diarrhea, unspecified: Secondary | ICD-10-CM | POA: Diagnosis not present

## 2018-03-23 DIAGNOSIS — Z9221 Personal history of antineoplastic chemotherapy: Secondary | ICD-10-CM | POA: Diagnosis not present

## 2018-03-23 DIAGNOSIS — C50412 Malignant neoplasm of upper-outer quadrant of left female breast: Secondary | ICD-10-CM | POA: Diagnosis present

## 2018-03-23 DIAGNOSIS — Z5112 Encounter for antineoplastic immunotherapy: Secondary | ICD-10-CM | POA: Insufficient documentation

## 2018-03-23 DIAGNOSIS — Z923 Personal history of irradiation: Secondary | ICD-10-CM | POA: Insufficient documentation

## 2018-03-23 DIAGNOSIS — Z95828 Presence of other vascular implants and grafts: Secondary | ICD-10-CM

## 2018-03-23 LAB — CBC WITH DIFFERENTIAL (CANCER CENTER ONLY)
Abs Immature Granulocytes: 0.01 10*3/uL (ref 0.00–0.07)
Basophils Absolute: 0 10*3/uL (ref 0.0–0.1)
Basophils Relative: 0 %
Eosinophils Absolute: 0.1 10*3/uL (ref 0.0–0.5)
Eosinophils Relative: 3 %
HCT: 36.1 % (ref 36.0–46.0)
Hemoglobin: 12.1 g/dL (ref 12.0–15.0)
IMMATURE GRANULOCYTES: 0 %
Lymphocytes Relative: 26 %
Lymphs Abs: 0.7 10*3/uL (ref 0.7–4.0)
MCH: 31.4 pg (ref 26.0–34.0)
MCHC: 33.5 g/dL (ref 30.0–36.0)
MCV: 93.8 fL (ref 80.0–100.0)
Monocytes Absolute: 0.4 10*3/uL (ref 0.1–1.0)
Monocytes Relative: 15 %
NRBC: 0 % (ref 0.0–0.2)
Neutro Abs: 1.6 10*3/uL — ABNORMAL LOW (ref 1.7–7.7)
Neutrophils Relative %: 56 %
PLATELETS: 148 10*3/uL — AB (ref 150–400)
RBC: 3.85 MIL/uL — ABNORMAL LOW (ref 3.87–5.11)
RDW: 13.5 % (ref 11.5–15.5)
WBC Count: 2.8 10*3/uL — ABNORMAL LOW (ref 4.0–10.5)

## 2018-03-23 LAB — CMP (CANCER CENTER ONLY)
ALT: 42 U/L (ref 0–44)
AST: 40 U/L (ref 15–41)
Albumin: 3.6 g/dL (ref 3.5–5.0)
Alkaline Phosphatase: 87 U/L (ref 38–126)
Anion gap: 7 (ref 5–15)
BUN: 13 mg/dL (ref 8–23)
CO2: 32 mmol/L (ref 22–32)
Calcium: 8.8 mg/dL — ABNORMAL LOW (ref 8.9–10.3)
Chloride: 104 mmol/L (ref 98–111)
Creatinine: 0.76 mg/dL (ref 0.44–1.00)
GFR, Est AFR Am: >60 mL/min (ref >60)
GFR, Estimated: >60 mL/min (ref >60)
Glucose, Bld: 122 mg/dL — ABNORMAL HIGH (ref 70–99)
Potassium: 3.7 mmol/L (ref 3.5–5.1)
Sodium: 143 mmol/L (ref 135–145)
Total Bilirubin: 0.4 mg/dL (ref 0.3–1.2)
Total Protein: 6.5 g/dL (ref 6.5–8.1)

## 2018-03-23 MED ORDER — TRASTUZUMAB CHEMO 150 MG IV SOLR
600.0000 mg | Freq: Once | INTRAVENOUS | Status: AC
  Administered 2018-03-23: 600 mg via INTRAVENOUS
  Filled 2018-03-23: qty 28.57

## 2018-03-23 MED ORDER — DIPHENHYDRAMINE HCL 25 MG PO CAPS
50.0000 mg | ORAL_CAPSULE | Freq: Once | ORAL | Status: AC
  Administered 2018-03-23: 50 mg via ORAL

## 2018-03-23 MED ORDER — ANASTROZOLE 1 MG PO TABS: 1.0000 mg | ORAL_TABLET | Freq: Every day | ORAL | 3 refills | Status: DC

## 2018-03-23 MED ORDER — METHYLPREDNISOLONE SODIUM SUCC 125 MG IJ SOLR
60.0000 mg | Freq: Once | INTRAMUSCULAR | Status: AC
  Administered 2018-03-23: 60 mg via INTRAVENOUS

## 2018-03-23 MED ORDER — FAMOTIDINE IN NACL 20-0.9 MG/50ML-% IV SOLN
INTRAVENOUS | Status: AC
  Filled 2018-03-23: qty 50

## 2018-03-23 MED ORDER — ACETAMINOPHEN 325 MG PO TABS
650.0000 mg | ORAL_TABLET | Freq: Once | ORAL | Status: AC
  Administered 2018-03-23: 650 mg via ORAL

## 2018-03-23 MED ORDER — METHYLPREDNISOLONE SODIUM SUCC 125 MG IJ SOLR
INTRAMUSCULAR | Status: AC
  Filled 2018-03-23: qty 2

## 2018-03-23 MED ORDER — DIPHENHYDRAMINE HCL 25 MG PO CAPS
ORAL_CAPSULE | ORAL | Status: AC
  Filled 2018-03-23: qty 2

## 2018-03-23 MED ORDER — FAMOTIDINE IN NACL 20-0.9 MG/50ML-% IV SOLN
20.0000 mg | Freq: Once | INTRAVENOUS | Status: AC
  Administered 2018-03-23: 20 mg via INTRAVENOUS

## 2018-03-23 MED ORDER — ACETAMINOPHEN 325 MG PO TABS
ORAL_TABLET | ORAL | Status: AC
  Filled 2018-03-23: qty 2

## 2018-03-23 MED ORDER — SODIUM CHLORIDE 0.9% FLUSH
10.0000 mL | INTRAVENOUS | Status: DC | PRN
  Administered 2018-03-23: 10 mL
  Filled 2018-03-23: qty 10

## 2018-03-23 MED ORDER — HEPARIN SOD (PORK) LOCK FLUSH 100 UNIT/ML IV SOLN
500.0000 [IU] | Freq: Once | INTRAVENOUS | Status: AC | PRN
  Administered 2018-03-23: 500 [IU]
  Filled 2018-03-23: qty 5

## 2018-03-23 MED ORDER — SODIUM CHLORIDE 0.9 % IV SOLN
Freq: Once | INTRAVENOUS | Status: AC
  Administered 2018-03-23: 15:00:00 via INTRAVENOUS
  Filled 2018-03-23: qty 250

## 2018-03-23 NOTE — Patient Instructions (Signed)
[facility] Discharge Instructions for Patients Receiving Chemotherapy  Today you received the following chemotherapy agents Herceptin  To help prevent nausea and vomiting after your treatment, we encourage you to take your nausea medication as directed   If you develop nausea and vomiting that is not controlled by your nausea medication, call the clinic.   BELOW ARE SYMPTOMS THAT SHOULD BE REPORTED IMMEDIATELY:  *FEVER GREATER THAN 100.5 F  *CHILLS WITH OR WITHOUT FEVER  NAUSEA AND VOMITING THAT IS NOT CONTROLLED WITH YOUR NAUSEA MEDICATION  *UNUSUAL SHORTNESS OF BREATH  *UNUSUAL BRUISING OR BLEEDING  TENDERNESS IN MOUTH AND THROAT WITH OR WITHOUT PRESENCE OF ULCERS  *URINARY PROBLEMS  *BOWEL PROBLEMS  UNUSUAL RASH Items with * indicate a potential emergency and should be followed up as soon as possible.  Feel free to call the clinic should you have any questions or concerns. The clinic phone number is [phone].  Please show the CHEMO ALERT CARD at check-in to the Emergency Department and triage nurse.   

## 2018-03-24 ENCOUNTER — Encounter (HOSPITAL_COMMUNITY): Payer: Medicare Other

## 2018-03-28 ENCOUNTER — Ambulatory Visit (HOSPITAL_COMMUNITY)
Admission: RE | Admit: 2018-03-28 | Discharge: 2018-03-28 | Disposition: A | Discharge status: Home / Self Care | Payer: Medicare Other | Source: Ambulatory Visit | Attending: Hematology and Oncology | Admitting: Hematology and Oncology

## 2018-03-28 DIAGNOSIS — I5022 Chronic systolic (congestive) heart failure: Secondary | ICD-10-CM | POA: Diagnosis present

## 2018-03-28 NOTE — Progress Notes (Signed)
  Echocardiogram 2D Echocardiogram has been performed.  Breanna [name] M 03/28/2018, 11:53 AM

## 2018-03-29 ENCOUNTER — Encounter (HOSPITAL_COMMUNITY): Payer: Medicare Other

## 2018-03-31 ENCOUNTER — Encounter (HOSPITAL_COMMUNITY): Payer: Medicare Other
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
## 2018-03-31 NOTE — Addendum Note (Signed)
Encounter addended by: Dwana Melena, RN on: 03/31/2018 3:38 PM  Actions taken: Visit Navigator Flowsheet section accepted, Clinical Note Signed, Episode resolved

## 2018-03-31 NOTE — Progress Notes (Signed)
Discharge Progress Report  Patient Details  Name: Breanna [name] MRN: [id number] Date of Birth: Sep 08, 1945 Referring Provider:     PULMONARY REHAB OTHER RESP ORIENTATION from 01/31/2018 in [place]  Referring Provider  [place]       Number of Visits: 1  Reason for Discharge:  Early Exit:  Acute Illness  Smoking History:  Social History   Tobacco Use  Smoking Status Former Smoker  . Packs/day: 1.00  . Years: 15.00  . Pack years: 15.00  . Types: Cigarettes  . Last attempt to quit: 03/09/1988  . Years since quitting: 30.0  Smokeless Tobacco Never Used    Diagnosis:  Restrictive lung disease  Paralysis of diaphragm  ADL UCSD: Pulmonary Assessment Scores    Row Name 01/31/18 1507         ADL UCSD   ADL Phase  Entry     SOB Score total  67     Rest  1     Walk  10     Stairs  2     Bath  3     Dress  3     Shop  4       CAT Score   CAT Score  18       mMRC Score   mMRC Score  4        Initial Exercise Prescription: Initial Exercise Prescription - 01/31/18 1400      Date of Initial Exercise RX and Referring Provider   Date  01/31/18    Referring Provider  [place]    Expected Discharge Date  05/03/18      Oxygen   Oxygen  Continuous    Liters  2      Treadmill   MPH  1.2    Grade  0    Minutes  17    METs  1.9      NuStep   Level  1    SPM  52    Minutes  22    METs  1.8      Prescription Details   Frequency (times per week)  2    Duration  Progress to 30 minutes of continuous aerobic without signs/symptoms of physical distress      Intensity   THRR 40-80% of Max Heartrate  [phone]    Ratings of Perceived Exertion  11-13    Perceived Dyspnea  0-4      Progression   Progression  Continue to progress workloads to maintain intensity without signs/symptoms of physical distress.      Resistance Training   Training Prescription  Yes    Weight  1    Reps  10-15       Discharge Exercise Prescription  (Final Exercise Prescription Changes): Exercise Prescription Changes - 01/31/18 1400      Response to Exercise   Blood Pressure (Admit)  130/72    Blood Pressure (Exercise)  150/70    Blood Pressure (Exit)  128/70    Heart Rate (Admit)  71 bpm    Heart Rate (Exercise)  103 bpm    Heart Rate (Exit)  76 bpm    Oxygen Saturation (Admit)  95 %    Oxygen Saturation (Exercise)  90 %    Oxygen Saturation (Exit)  97 %    Rating of Perceived Exertion (Exercise)  14    Perceived Dyspnea (Exercise)  14    Comments  6 minute walk test    Duration  Progress to 30 minutes of  aerobic without signs/symptoms of physical distress    Intensity  THRR New   [phone]      Functional Capacity: 6 Minute Walk    Row Name 01/31/18 1351         6 Minute Walk   Phase  Initial     Distance  1400 feet     Walk Time  6 minutes     # of Rest Breaks  0     MPH  2.65     METS  3.03     RPE  14     Perceived Dyspnea   14     VO2 Peak  9.16     Symptoms  No     Resting HR  71 bpm     Resting BP  130/72     Resting Oxygen Saturation   96 %     Exercise Oxygen Saturation  during 6 min walk  90 %     Max Ex. HR  103 bpm     Max Ex. BP  150/70     2 Minute Post BP  128/70        Psychological, QOL, Others - Outcomes: PHQ 2/9: Depression screen [facility] 2/9 01/31/2018 06/09/2017  Decreased Interest 0 0  Down, Depressed, Hopeless 0 0  PHQ - 2 Score 0 0  Altered sleeping 1 -  Tired, decreased energy 2 -  Change in appetite 2 -  Feeling bad or failure about yourself  0 -  Trouble concentrating 0 -  Moving slowly or fidgety/restless 0 -  Suicidal thoughts 0 -  PHQ-9 Score 5 -  Difficult doing work/chores Somewhat difficult -    Quality of Life: Quality of Life - 01/31/18 1354      Quality of Life   Select  Quality of Life      Quality of Life Scores   Health/Function Pre  13.6 %    Socioeconomic Pre  16.13 %    Psych/Spiritual Pre  18 %    Family Pre  24 %    GLOBAL Pre  16.54 %        Personal Goals: Goals established at orientation with interventions provided to work toward goal. Personal Goals and Risk Factors at Admission - 01/31/18 1509      Core Components/Risk Factors/Patient Goals on Admission   Personal Goal  Increase endurance, Get off oxygen    Intervention  Attend PR 2 x week and supplement 3 x week exercise at home.     Expected Outcomes  Reach personal goals.         Personal Goals Discharge:   Exercise Goals and Review: Exercise Goals    Row Name 01/31/18 1353             Exercise Goals   Increase Physical Activity  Yes       Intervention  Provide advice, education, support and counseling about physical activity/exercise needs.;Develop an individualized exercise prescription for aerobic and resistive training based on initial evaluation findings, risk stratification, comorbidities and participant's personal goals.       Expected Outcomes  Short Term: Attend rehab on a regular basis to increase amount of physical activity.       Increase Strength and Stamina  Yes       Intervention  Provide advice, education, support and counseling about physical activity/exercise needs.;Develop an individualized exercise prescription for aerobic and resistive training based on initial evaluation findings,  risk stratification, comorbidities and participant's personal goals.       Expected Outcomes  Short Term: Increase workloads from initial exercise prescription for resistance, speed, and METs.       Able to understand and use rate of perceived exertion (RPE) scale  Yes       Intervention  Provide education and explanation on how to use RPE scale       Expected Outcomes  Short Term: Able to use RPE daily in rehab to express subjective intensity level;Long Term:  Able to use RPE to guide intensity level when exercising independently       Able to understand and use Dyspnea scale  Yes       Intervention  Provide education and explanation on how to use Dyspnea  scale       Expected Outcomes  Short Term: Able to use Dyspnea scale daily in rehab to express subjective sense of shortness of breath during exertion;Long Term: Able to use Dyspnea scale to guide intensity level when exercising independently       Knowledge and understanding of Target Heart Rate Range (THRR)  Yes       Intervention  Provide education and explanation of THRR including how the numbers were predicted and where they are located for reference       Expected Outcomes  Short Term: Able to state/look up THRR;Long Term: Able to use THRR to govern intensity when exercising independently;Short Term: Able to use daily as guideline for intensity in rehab       Able to check pulse independently  Yes       Intervention  Provide education and demonstration on how to check pulse in carotid and radial arteries.;Review the importance of being able to check your own pulse for safety during independent exercise       Expected Outcomes  Short Term: Able to explain why pulse checking is important during independent exercise;Long Term: Able to check pulse independently and accurately       Understanding of Exercise Prescription  Yes       Intervention  Provide education, explanation, and written materials on patient's individual exercise prescription       Expected Outcomes  Short Term: Able to explain program exercise prescription;Long Term: Able to explain home exercise prescription to exercise independently          Exercise Goals Re-Evaluation:   Nutrition & Weight - Outcomes: Pre Biometrics - 01/31/18 1353      Pre Biometrics   Height  5\' 4"  (1.626 m)    Weight  98.7 kg    Waist Circumference  43 inches    Hip Circumference  49 inches    Waist to Hip Ratio  0.88 %    BMI (Calculated)  37.33    Triceps Skinfold  37 mm    % Body Fat  48.7 %    Grip Strength  16.8 kg    Flexibility  0 in    Single Leg Stand  9.2 seconds        Nutrition: Nutrition Therapy & Goals - 01/31/18 1443       Personal Nutrition Goals   Personal Goal #2  Patient is getting Chemo-therapy and radiation so she states that there are times when she is too sick to eat. When she does feel like eating she has to eat what she can that does not make her sick. Right now all of here choices may not be heart healthy.  Additional Goals?  No       Nutrition Discharge: Nutrition Assessments - 01/31/18 1445      MEDFICTS Scores   Pre Score  67       Education Questionnaire Score: Knowledge Questionnaire Score - 01/31/18 1432      Knowledge Questionnaire Score   Pre Score  15/18

## 2018-04-05 ENCOUNTER — Encounter: Payer: Self-pay | Admitting: Adult Health

## 2018-04-05 ENCOUNTER — Encounter (HOSPITAL_COMMUNITY): Payer: Medicare Other

## 2018-04-07 ENCOUNTER — Encounter (HOSPITAL_COMMUNITY): Payer: Medicare Other

## 2018-04-08 ENCOUNTER — Other Ambulatory Visit: Payer: Self-pay | Admitting: Hematology and Oncology

## 2018-04-08 ENCOUNTER — Telehealth: Payer: Self-pay | Admitting: *Deleted

## 2018-04-08 NOTE — Telephone Encounter (Signed)
Refill received via Surescript interface for Plavix 75 mg refused.  Patient "asked pharmacy to send request to new PCP Dr. Delfina Redwood in [place], [place]ulse."  Care Team updated.

## 2018-04-09 ENCOUNTER — Emergency Department (HOSPITAL_COMMUNITY)
Admission: EM | Admit: 2018-04-09 | Discharge: 2018-04-10 | Disposition: A | Discharge status: Home / Self Care | Payer: Medicare Other | Attending: Emergency Medicine | Admitting: Emergency Medicine

## 2018-04-09 ENCOUNTER — Emergency Department (HOSPITAL_COMMUNITY): Payer: Medicare Other

## 2018-04-09 ENCOUNTER — Other Ambulatory Visit: Payer: Self-pay

## 2018-04-09 ENCOUNTER — Encounter (HOSPITAL_COMMUNITY): Payer: Self-pay

## 2018-04-09 DIAGNOSIS — Z87891 Personal history of nicotine dependence: Secondary | ICD-10-CM | POA: Diagnosis not present

## 2018-04-09 DIAGNOSIS — A6004 Herpesviral vulvovaginitis: Secondary | ICD-10-CM | POA: Diagnosis not present

## 2018-04-09 DIAGNOSIS — N3001 Acute cystitis with hematuria: Secondary | ICD-10-CM | POA: Diagnosis not present

## 2018-04-09 DIAGNOSIS — J449 Chronic obstructive pulmonary disease, unspecified: Secondary | ICD-10-CM | POA: Diagnosis not present

## 2018-04-09 DIAGNOSIS — R300 Dysuria: Secondary | ICD-10-CM | POA: Diagnosis present

## 2018-04-09 DIAGNOSIS — A6009 Herpesviral infection of other urogenital tract: Secondary | ICD-10-CM

## 2018-04-09 DIAGNOSIS — I10 Essential (primary) hypertension: Secondary | ICD-10-CM | POA: Diagnosis not present

## 2018-04-09 DIAGNOSIS — Z79899 Other long term (current) drug therapy: Secondary | ICD-10-CM | POA: Diagnosis not present

## 2018-04-09 LAB — BASIC METABOLIC PANEL
Anion gap: 13 (ref 5–15)
BUN: 8 mg/dL (ref 8–23)
CO2: 25 mmol/L (ref 22–32)
Calcium: 8.2 mg/dL — ABNORMAL LOW (ref 8.9–10.3)
Chloride: 92 mmol/L — ABNORMAL LOW (ref 98–111)
Creatinine, Ser: 0.99 mg/dL (ref 0.44–1.00)
GFR calc Af Amer: >60 mL/min (ref >60)
GFR calc non Af Amer: 57 mL/min — ABNORMAL LOW (ref >60)
GLUCOSE: 97 mg/dL (ref 70–99)
Potassium: 3.9 mmol/L (ref 3.5–5.1)
Sodium: 130 mmol/L — ABNORMAL LOW (ref 135–145)

## 2018-04-09 LAB — CBC WITH DIFFERENTIAL/PLATELET
Abs Immature Granulocytes: 0.01 10*3/uL (ref 0.00–0.07)
Basophils Absolute: 0 10*3/uL (ref 0.0–0.1)
Basophils Relative: 0 %
Eosinophils Absolute: 0 10*3/uL (ref 0.0–0.5)
Eosinophils Relative: 0 %
HCT: 37 % (ref 36.0–46.0)
Hemoglobin: 11.8 g/dL — ABNORMAL LOW (ref 12.0–15.0)
Immature Granulocytes: 0 %
Lymphocytes Relative: 16 %
Lymphs Abs: 0.7 10*3/uL (ref 0.7–4.0)
MCH: 30.8 pg (ref 26.0–34.0)
MCHC: 31.9 g/dL (ref 30.0–36.0)
MCV: 96.6 fL (ref 80.0–100.0)
MONOS PCT: 10 %
Monocytes Absolute: 0.4 10*3/uL (ref 0.1–1.0)
Neutro Abs: 3.2 10*3/uL (ref 1.7–7.7)
Neutrophils Relative %: 74 %
Platelets: 129 10*3/uL — ABNORMAL LOW (ref 150–400)
RBC: 3.83 MIL/uL — ABNORMAL LOW (ref 3.87–5.11)
RDW: 13.4 % (ref 11.5–15.5)
WBC: 4.3 10*3/uL (ref 4.0–10.5)
nRBC: 0 % (ref 0.0–0.2)

## 2018-04-09 LAB — URINALYSIS, ROUTINE W REFLEX MICROSCOPIC
BILIRUBIN URINE: NEGATIVE
Bacteria, UA: NONE SEEN
Glucose, UA: NEGATIVE mg/dL
Ketones, ur: 20 mg/dL — AB
Nitrite: NEGATIVE
Protein, ur: 100 mg/dL — AB
RBC / HPF: >50 RBC/hpf — ABNORMAL HIGH (ref 0–5)
Specific Gravity, Urine: 1.016 (ref 1.005–1.030)
WBC, UA: >50 WBC/hpf — ABNORMAL HIGH (ref 0–5)
pH: 5 (ref 5.0–8.0)

## 2018-04-09 LAB — LACTIC ACID, PLASMA: Lactic Acid, Venous: 0.9 mmol/L (ref 0.5–1.9)

## 2018-04-09 LAB — INFLUENZA PANEL BY PCR (TYPE A & B)
INFLBPCR: NEGATIVE
Influenza A By PCR: NEGATIVE

## 2018-04-09 MED ORDER — ACETAMINOPHEN 325 MG PO TABS
650.0000 mg | ORAL_TABLET | Freq: Once | ORAL | Status: AC
  Administered 2018-04-09: 650 mg via ORAL
  Filled 2018-04-09: qty 2

## 2018-04-09 MED ORDER — OXYCODONE-ACETAMINOPHEN 5-325 MG PO TABS
1.0000 | ORAL_TABLET | Freq: Once | ORAL | Status: AC
  Administered 2018-04-09: 1 via ORAL
  Filled 2018-04-09: qty 1

## 2018-04-09 MED ORDER — DOXYCYCLINE HYCLATE 100 MG PO CAPS: 100.0000 mg | ORAL_CAPSULE | Freq: Two times a day (BID) | ORAL | 0 refills | Status: DC

## 2018-04-09 MED ORDER — SODIUM CHLORIDE 0.9 % IV BOLUS
1000.0000 mL | Freq: Once | INTRAVENOUS | Status: AC
  Administered 2018-04-09: 1000 mL via INTRAVENOUS

## 2018-04-09 MED ORDER — VALACYCLOVIR HCL 1 G PO TABS: 1000.0000 mg | ORAL_TABLET | Freq: Three times a day (TID) | ORAL | 0 refills | Status: AC

## 2018-04-09 MED ORDER — DOXYCYCLINE HYCLATE 100 MG PO TABS
100.0000 mg | ORAL_TABLET | Freq: Once | ORAL | Status: AC
  Administered 2018-04-09: 100 mg via ORAL
  Filled 2018-04-09: qty 1

## 2018-04-09 MED ORDER — VALACYCLOVIR HCL 500 MG PO TABS
1000.0000 mg | ORAL_TABLET | Freq: Once | ORAL | Status: AC
  Administered 2018-04-09: 1000 mg via ORAL
  Filled 2018-04-09: qty 2

## 2018-04-09 MED ORDER — HEPARIN SOD (PORK) LOCK FLUSH 100 UNIT/ML IV SOLN
500.0000 [IU] | Freq: Once | INTRAVENOUS | Status: AC
  Administered 2018-04-10: 500 [IU]
  Filled 2018-04-09: qty 5
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
## 2018-04-09 NOTE — ED Triage Notes (Signed)
Pt arrives POV for eval of dysuria and "vaginal pain" . Pt reports she was seen by PCP and started on bactrim for +UTI and pyelo. States PCP called her yesterday and stated that she couldn't take that d/t active chemo. PCP told her to take it for 3 days and call back if it didn't help. Pt reports pain and dysuria getting worse. Pt currently receiving active chemo for breast cancer. Pt febrile to 101 and satting 85% on RA.

## 2018-04-09 NOTE — Discharge Instructions (Signed)
Take doxycycline twice daily for a week for UTI   Take valtrex three times daily for 10 days for herpes infection   See your doctor next week   Return to ER if you have severe pelvic pain, fever, vomiting, trouble urinating.

## 2018-04-10 DIAGNOSIS — N3001 Acute cystitis with hematuria: Secondary | ICD-10-CM | POA: Diagnosis not present

## 2018-04-10 NOTE — ED Notes (Signed)
PT states understanding of care given, follow up care, and medication prescribed. PT ambulated from ED to car with a steady gait. 

## 2018-04-11 ENCOUNTER — Emergency Department (HOSPITAL_COMMUNITY)
Admission: EM | Admit: 2018-04-11 | Discharge: 2018-04-12 | Disposition: A | Discharge status: Home / Self Care | Payer: Medicare Other | Attending: Emergency Medicine | Admitting: Emergency Medicine

## 2018-04-11 ENCOUNTER — Encounter (HOSPITAL_COMMUNITY): Payer: Self-pay | Admitting: *Deleted

## 2018-04-11 DIAGNOSIS — Z87891 Personal history of nicotine dependence: Secondary | ICD-10-CM | POA: Insufficient documentation

## 2018-04-11 DIAGNOSIS — R102 Pelvic and perineal pain: Secondary | ICD-10-CM | POA: Insufficient documentation

## 2018-04-11 DIAGNOSIS — Z79899 Other long term (current) drug therapy: Secondary | ICD-10-CM | POA: Insufficient documentation

## 2018-04-11 DIAGNOSIS — I10 Essential (primary) hypertension: Secondary | ICD-10-CM | POA: Insufficient documentation

## 2018-04-11 DIAGNOSIS — J449 Chronic obstructive pulmonary disease, unspecified: Secondary | ICD-10-CM | POA: Insufficient documentation

## 2018-04-11 DIAGNOSIS — R110 Nausea: Secondary | ICD-10-CM

## 2018-04-11 LAB — URINE CULTURE: Culture: <10000 — AB

## 2018-04-11 MED ORDER — LIDOCAINE-PRILOCAINE 2.5-2.5 % EX CREA
TOPICAL_CREAM | Freq: Once | CUTANEOUS | Status: AC
  Administered 2018-04-12: 01:00:00 via TOPICAL
  Filled 2018-04-11: qty 5

## 2018-04-11 MED ORDER — SODIUM CHLORIDE 0.9 % IV BOLUS
1000.0000 mL | Freq: Once | INTRAVENOUS | Status: AC
  Administered 2018-04-12: 1000 mL via INTRAVENOUS

## 2018-04-11 MED ORDER — ONDANSETRON HCL 4 MG/2ML IJ SOLN
4.0000 mg | Freq: Once | INTRAMUSCULAR | Status: AC
  Administered 2018-04-12: 4 mg via INTRAVENOUS
  Filled 2018-04-11: qty 2

## 2018-04-11 MED ORDER — ONDANSETRON 4 MG PO TBDP
4.0000 mg | ORAL_TABLET | Freq: Once | ORAL | Status: AC | PRN
  Administered 2018-04-11: 4 mg via ORAL
  Filled 2018-04-11: qty 1

## 2018-04-11 NOTE — ED Notes (Signed)
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
## 2018-04-11 NOTE — ED Triage Notes (Addendum)
Pt endorses worsening vaginal irritation and burning pain.  She was dx and being tx for UTI since Thursday.  Saturday, she was found to have developed genital herpes precipitated by the chemo.  She reports the burning is worse and she also c/o of nausea.  Pt also reports the sores in her vagina are bleeding.

## 2018-04-12 ENCOUNTER — Encounter (HOSPITAL_COMMUNITY): Payer: Medicare Other

## 2018-04-12 DIAGNOSIS — R102 Pelvic and perineal pain: Secondary | ICD-10-CM | POA: Diagnosis not present

## 2018-04-12 LAB — BASIC METABOLIC PANEL
Anion gap: 6 (ref 5–15)
BUN: 10 mg/dL (ref 8–23)
CO2: 30 mmol/L (ref 22–32)
Calcium: 8.8 mg/dL — ABNORMAL LOW (ref 8.9–10.3)
Chloride: 103 mmol/L (ref 98–111)
Creatinine, Ser: 0.73 mg/dL (ref 0.44–1.00)
GFR calc Af Amer: >60 mL/min (ref >60)
GFR calc non Af Amer: >60 mL/min (ref >60)
Glucose, Bld: 86 mg/dL (ref 70–99)
POTASSIUM: 3.7 mmol/L (ref 3.5–5.1)
Sodium: 139 mmol/L (ref 135–145)

## 2018-04-12 LAB — CBC WITH DIFFERENTIAL/PLATELET
ABS IMMATURE GRANULOCYTES: 0.01 10*3/uL (ref 0.00–0.07)
Basophils Absolute: 0 10*3/uL (ref 0.0–0.1)
Basophils Relative: 0 %
Eosinophils Absolute: 0 10*3/uL (ref 0.0–0.5)
Eosinophils Relative: 1 %
HCT: 40.9 % (ref 36.0–46.0)
Hemoglobin: 13.3 g/dL (ref 12.0–15.0)
Immature Granulocytes: 1 %
Lymphocytes Relative: 35 %
Lymphs Abs: 0.7 10*3/uL (ref 0.7–4.0)
MCH: 31 pg (ref 26.0–34.0)
MCHC: 32.5 g/dL (ref 30.0–36.0)
MCV: 95.3 fL (ref 80.0–100.0)
MONO ABS: 0.3 10*3/uL (ref 0.1–1.0)
Monocytes Relative: 15 %
Neutro Abs: 1 10*3/uL — ABNORMAL LOW (ref 1.7–7.7)
Neutrophils Relative %: 48 %
PLATELETS: 126 10*3/uL — AB (ref 150–400)
RBC: 4.29 MIL/uL (ref 3.87–5.11)
RDW: 13.5 % (ref 11.5–15.5)
WBC: 2 10*3/uL — ABNORMAL LOW (ref 4.0–10.5)
nRBC: 0 % (ref 0.0–0.2)

## 2018-04-12 MED ORDER — ONDANSETRON 4 MG PO TBDP: 4.0000 mg | ORAL_TABLET | Freq: Three times a day (TID) | ORAL | 0 refills | Status: DC | PRN

## 2018-04-12 MED ORDER — HEPARIN SOD (PORK) LOCK FLUSH 100 UNIT/ML IV SOLN
500.0000 [IU] | Freq: Once | INTRAVENOUS | Status: AC
  Administered 2018-04-12: 500 [IU]
  Filled 2018-04-12: qty 5

## 2018-04-12 MED ORDER — LIDOCAINE-PRILOCAINE 2.5-2.5 % EX CREA: 1.0000 "application " | TOPICAL_CREAM | CUTANEOUS | 0 refills | Status: DC | PRN

## 2018-04-12 NOTE — Discharge Instructions (Signed)
You can stop the doxycycline since urine culture was negative.  Can continue zofran as needed for nausea. Follow-up with your primary care doctor. Return to the ED for new or worsening symptoms.

## 2018-04-12 NOTE — ED Notes (Signed)
Port accessed

## 2018-04-12 NOTE — ED Notes (Signed)
Patient verbalizes understanding of discharge instructions. Opportunity for questioning and answers were provided. Armband removed by staff, pt discharged from ED home via POV with family. 

## 2018-04-13 ENCOUNTER — Inpatient Hospital Stay: Payer: Medicare Other

## 2018-04-13 ENCOUNTER — Other Ambulatory Visit: Payer: Self-pay

## 2018-04-13 ENCOUNTER — Other Ambulatory Visit: Payer: Medicare Other

## 2018-04-13 ENCOUNTER — Inpatient Hospital Stay: Payer: Medicare Other | Attending: Hematology and Oncology | Admitting: Hematology and Oncology

## 2018-04-13 VITALS — BP 143/70 | HR 71 | Temp 98.3°F | Resp 17 | Ht 65.0 in | Wt 212.2 lb

## 2018-04-13 DIAGNOSIS — Z170 Estrogen receptor positive status [ER+]: Principal | ICD-10-CM

## 2018-04-13 DIAGNOSIS — Z5112 Encounter for antineoplastic immunotherapy: Secondary | ICD-10-CM | POA: Diagnosis present

## 2018-04-13 DIAGNOSIS — Z923 Personal history of irradiation: Secondary | ICD-10-CM | POA: Insufficient documentation

## 2018-04-13 DIAGNOSIS — Z9221 Personal history of antineoplastic chemotherapy: Secondary | ICD-10-CM | POA: Diagnosis not present

## 2018-04-13 DIAGNOSIS — Z79899 Other long term (current) drug therapy: Secondary | ICD-10-CM | POA: Insufficient documentation

## 2018-04-13 DIAGNOSIS — Z9012 Acquired absence of left breast and nipple: Secondary | ICD-10-CM | POA: Insufficient documentation

## 2018-04-13 DIAGNOSIS — C50412 Malignant neoplasm of upper-outer quadrant of left female breast: Secondary | ICD-10-CM

## 2018-04-13 DIAGNOSIS — Z95828 Presence of other vascular implants and grafts: Secondary | ICD-10-CM

## 2018-04-13 DIAGNOSIS — Z79811 Long term (current) use of aromatase inhibitors: Secondary | ICD-10-CM | POA: Insufficient documentation

## 2018-04-13 DIAGNOSIS — R110 Nausea: Secondary | ICD-10-CM

## 2018-04-13 LAB — CMP (CANCER CENTER ONLY)
ALT: 38 U/L (ref 0–44)
AST: 53 U/L — ABNORMAL HIGH (ref 15–41)
Albumin: 3.4 g/dL — ABNORMAL LOW (ref 3.5–5.0)
Alkaline Phosphatase: 68 U/L (ref 38–126)
Anion gap: 6 (ref 5–15)
BILIRUBIN TOTAL: 0.4 mg/dL (ref 0.3–1.2)
BUN: 7 mg/dL — ABNORMAL LOW (ref 8–23)
CALCIUM: 9.1 mg/dL (ref 8.9–10.3)
CO2: 31 mmol/L (ref 22–32)
Chloride: 104 mmol/L (ref 98–111)
Creatinine: 0.67 mg/dL (ref 0.44–1.00)
GFR, Est AFR Am: >60 mL/min (ref >60)
GFR, Estimated: >60 mL/min (ref >60)
Glucose, Bld: 110 mg/dL — ABNORMAL HIGH (ref 70–99)
Potassium: 4.3 mmol/L (ref 3.5–5.1)
Sodium: 141 mmol/L (ref 135–145)
Total Protein: 6.5 g/dL (ref 6.5–8.1)

## 2018-04-13 LAB — CBC WITH DIFFERENTIAL (CANCER CENTER ONLY)
Abs Immature Granulocytes: 0.04 10*3/uL (ref 0.00–0.07)
Basophils Absolute: 0 10*3/uL (ref 0.0–0.1)
Basophils Relative: 1 %
EOS ABS: 0.1 10*3/uL (ref 0.0–0.5)
Eosinophils Relative: 2 %
HCT: 34.4 % — ABNORMAL LOW (ref 36.0–46.0)
Hemoglobin: 11.5 g/dL — ABNORMAL LOW (ref 12.0–15.0)
Immature Granulocytes: 2 %
Lymphocytes Relative: 28 %
Lymphs Abs: 0.8 10*3/uL (ref 0.7–4.0)
MCH: 31.3 pg (ref 26.0–34.0)
MCHC: 33.4 g/dL (ref 30.0–36.0)
MCV: 93.7 fL (ref 80.0–100.0)
Monocytes Absolute: 0.3 10*3/uL (ref 0.1–1.0)
Monocytes Relative: 12 %
Neutro Abs: 1.5 10*3/uL — ABNORMAL LOW (ref 1.7–7.7)
Neutrophils Relative %: 55 %
Platelet Count: 157 10*3/uL (ref 150–400)
RBC: 3.67 MIL/uL — ABNORMAL LOW (ref 3.87–5.11)
RDW: 13.3 % (ref 11.5–15.5)
WBC Count: 2.7 10*3/uL — ABNORMAL LOW (ref 4.0–10.5)
nRBC: 0 % (ref 0.0–0.2)

## 2018-04-13 MED ORDER — ONDANSETRON HCL 4 MG/2ML IJ SOLN
8.0000 mg | Freq: Once | INTRAMUSCULAR | Status: AC
  Administered 2018-04-13: 8 mg via INTRAVENOUS

## 2018-04-13 MED ORDER — ONDANSETRON HCL 4 MG/2ML IJ SOLN
INTRAMUSCULAR | Status: AC
  Filled 2018-04-13: qty 4

## 2018-04-13 MED ORDER — HEPARIN SOD (PORK) LOCK FLUSH 100 UNIT/ML IV SOLN
500.0000 [IU] | Freq: Once | INTRAVENOUS | Status: AC | PRN
  Administered 2018-04-13: 500 [IU]
  Filled 2018-04-13: qty 5

## 2018-04-13 MED ORDER — SODIUM CHLORIDE 0.9% FLUSH
10.0000 mL | INTRAVENOUS | Status: DC | PRN
  Administered 2018-04-13: 10 mL
  Filled 2018-04-13: qty 10

## 2018-04-13 NOTE — Addendum Note (Signed)
Addended by: Rennis Harding on: 04/13/2018 12:54 PM   Modules accepted: Orders

## 2018-04-13 NOTE — Progress Notes (Signed)
Patient Care Team: [name], MD as PCP - General (Family Medicine)  DIAGNOSIS:    ICD-10-CM   1. Nausea without vomiting R11.0 ondansetron (ZOFRAN) injection 8 mg  2. Malignant neoplasm of upper-outer quadrant of left breast in female, estrogen receptor positive ([place]) C50.412    Z17.0     SUMMARY OF ONCOLOGIC HISTORY:   Malignant neoplasm of upper-outer quadrant of left breast in female, estrogen receptor positive ([place])   05/13/2017 Initial Diagnosis    [place] biopsy: Left breast biopsy 2 o'clock position 8 cm from nipple ultrasound-guided biopsy: Grade 2 IDC, ER 50%, PR 3%, HER-2 equivocal Ki-67 15% nipple area biopsy grade 2 IDC, ER 100% positive, PR negative, HER-2 equivocal, Ki-67 40%    05/31/2017 Breast MRI    Left breast UOQ 230 position: 2 x 1.9 x 1.9 cm mass, second mass left breast UOQ 230 position: 1 x 1 x 0.7 cm, 2 more enhancing adjacent 3 and 5 mm nodules slightly superiorly midway between the 2 masses.  No abnormal lymph nodes, right breast normal    06/11/2017 Surgery    Left Mastectomy: 2 foci of IDC 2.2 and 1 cm Grade 2, 1/2 LN Positive; Grade 2 IDC, ER 50%, PR 3%, HER-2 equivocal Ki-67 15% nipple area biopsy grade 2 IDC, ER 100% positive, PR negative, HER-2 equivocal, Ki-67 40% T2N1a Stage 1B    06/25/2017 Miscellaneous    Mammaprint high risk luminal type B    07/23/2017 - 11/10/2017 Chemotherapy    Adjuvant chemotherapy with dose dense Adriamycin and Cytoxan x4 followed by Taxol weekly x6     12/10/2017 Pathology Results    Repeat analysis of pathology confirmed that patient has heterogeneous HER-2 signaling.  There is equivocal IHC 2+ and fish negative areas where the ratio was 1.49 and copy number of 2.9.  There is one area that is HER-2 +3+ by [facility]    12/28/2017 - 02/22/2018 Radiation Therapy    Adj XRT at [facility]    12/29/2017 -  Chemotherapy    The patient had trastuzumab (HERCEPTIN) 798 mg in sodium chloride 0.9 % 250 mL chemo infusion, 8  mg/kg = 798 mg, Intravenous,  Once, 5 of 18 cycles Administration: 798 mg (12/29/2017), 588 mg (01/19/2018), 600 mg (03/23/2018), 600 mg (02/09/2018), 600 mg (03/03/2018)  for chemotherapy treatment.     03/23/2018 -  Anti-estrogen oral therapy    Anastrozole, 60m daily     CHIEF COMPLIANT: Follow-up of anastrozole and Herceptin after recent hospitalization  INTERVAL HISTORY: JNALA KACHELis a 73y.o. with above-mentioned history of HER-2 positive breast cancer who is currently on Herceptin maintenance and anti-estrogen therapy with anastrozole. She presents to the clinic today with two family members. She reports she delayed anastrozole for a week and began taking it two weeks ago. She presented to her PCP for vaginal pain and burning when peeing present for a week prior to her visit, and was diagnosed with a UTI and given Bactrim. Her symptoms persisted and she presented to the ED and was diagnosed with genital herpes and put on Valtrex, which caused nausea, vomiting and shakes. She lost her appetite and could not eat or drink and only began eating again yesterday. She is worried about getting shingles. Her husband is concerned about her possibly contracting an infection from their grandchildren, who live with them. [facility] are interested in testing a sample from her lungs.  REVIEW OF  SYSTEMS:   Constitutional: Denies fevers, chills or abnormal weight loss (+) body shakes (+) loss of appetite Eyes: Denies blurriness of vision Ears, nose, mouth, throat, and face: Denies mucositis or sore throat Respiratory: Denies cough or wheezes (+) occasional dyspnea  Cardiovascular: Denies palpitation, chest discomfort Gastrointestinal: Denies nausea, heartburn or change in bowel habits (+) vomiting (+) nausea Skin: Denies abnormal skin rashes Lymphatics: Denies new lymphadenopathy or easy bruising Neurological: Denies numbness, tingling or new  weaknesses Behavioral/Psych: Mood is stable, no new changes  Extremities: No lower extremity edema Breast: denies any pain or lumps or nodules in either breasts All other systems were reviewed with the patient and are negative.  I have reviewed the past medical history, past surgical history, social history and family history with the patient and they are unchanged from previous note.  ALLERGIES:  is allergic to atorvastatin; vancomycin; ancef [cefazolin]; lactose; nasonex [mometasone furoate]; and neurontin [gabapentin].  MEDICATIONS:  Current Outpatient Medications  Medication Sig Dispense Refill  . acetaminophen (TYLENOL) 500 MG tablet Take 1,000 mg by mouth 3 (three) times daily as needed for headache (pain).     Marland Kitchen amLODipine (NORVASC) 5 MG tablet Take 5 mg by mouth daily.     Marland Kitchen anastrozole (ARIMIDEX) 1 MG tablet Take 1 tablet (1 mg total) by mouth daily. 90 tablet 3  . atorvastatin (LIPITOR) 20 MG tablet Take 20 mg by mouth at bedtime.    . B Complex-C (SUPER B COMPLEX PO) Take 1 tablet by mouth daily.    . bumetanide (BUMEX) 1 MG tablet Take 1 tablet (1 mg total) by mouth daily. 30 tablet 3  . Calcium Carb-Cholecalciferol (CALCIUM 600-D PO) Take 1 tablet by mouth at bedtime.    . carvedilol (COREG) 25 MG tablet Take 1 tablet (25 mg total) by mouth 2 (two) times daily with a meal. 60 tablet 0  . cetirizine (ZYRTEC) 10 MG tablet Take 10 mg by mouth at bedtime.     . clopidogrel (PLAVIX) 75 MG tablet Take 1 tablet (75 mg total) by mouth every evening. (Patient taking differently: Take 75 mg by mouth daily. ) 30 tablet 0  . diclofenac sodium (VOLTAREN) 1 % GEL Apply 4 g topically at bedtime. 100 g 6  . diphenoxylate-atropine (LOMOTIL) 2.5-0.025 MG tablet Take 1 tablet by mouth 4 (four) times daily as needed for diarrhea or loose stools. (Patient not taking: Reported on 04/09/2018) 30 tablet 0  . doxycycline (VIBRAMYCIN) 100 MG capsule Take 1 capsule (100 mg total) by mouth 2 (two) times  daily. One po bid x 7 days 14 capsule 0  . ferrous sulfate 325 (65 FE) MG tablet Take 325 mg by mouth daily with breakfast.    . fexofenadine (ALLEGRA) 180 MG tablet Take 180 mg by mouth daily.    . fluticasone (FLONASE) 50 MCG/ACT nasal spray Place 1 spray into both nostrils daily as needed (seasonal allergies).     Marland Kitchen ipratropium (ATROVENT) 0.03 % nasal spray Place 2 sprays into both nostrils every 8 (eight) hours.    . lansoprazole (PREVACID) 15 MG capsule Take 15 mg by mouth daily.     Marland Kitchen lidocaine-prilocaine (EMLA) cream Apply 1 application topically as needed. 30 g 0  . lisinopril (PRINIVIL,ZESTRIL) 40 MG tablet Take 40 mg by mouth daily.    . ondansetron (ZOFRAN ODT) 4 MG disintegrating tablet Take 1 tablet (4 mg total) by mouth every 8 (eight) hours as needed for nausea. 15 tablet 0  . ondansetron (ZOFRAN) 8  MG tablet Take 1 tablet (8 mg total) by mouth every 8 (eight) hours as needed for nausea or vomiting. (Patient not taking: Reported on 04/09/2018) 20 tablet 0  . PARoxetine (PAXIL) 10 MG tablet Take 10 mg by mouth at bedtime.     Vladimir Faster Glycol-Propyl Glycol (SYSTANE OP) Place 1 drop into both eyes daily as needed (for dry eyes).     . potassium chloride (K-DUR,KLOR-CON) 10 MEQ tablet Take 10 mEq by mouth at bedtime.     . sulfamethoxazole-trimethoprim (BACTRIM DS,SEPTRA DS) 800-160 MG tablet Take 1 tablet by mouth 2 (two) times daily.    . traMADol (ULTRAM) 50 MG tablet Take 1 tablet (50 mg total) by mouth 2 (two) times daily as needed for moderate pain (mild pain). 20 tablet 0  . valACYclovir (VALTREX) 1000 MG tablet Take 1 tablet (1,000 mg total) by mouth 3 (three) times daily for 10 days. 30 tablet 0   Current Facility-Administered Medications  Medication Dose Route Frequency Provider Last Rate Last Dose  . ondansetron (ZOFRAN) injection 8 mg  8 mg Intravenous Once Nicholas Lose, MD        PHYSICAL EXAMINATION: ECOG PERFORMANCE STATUS: 1 - Symptomatic but completely  ambulatory  Vitals:   04/13/18 1158  BP: (!) 143/70  Pulse: 71  Resp: 17  Temp: 98.3 F (36.8 C)  SpO2: 93%   Filed Weights   04/13/18 1158  Weight: 96.3 kg    GENERAL: alert, no distress and comfortable SKIN: skin color, texture, turgor are normal, no rashes or significant lesions EYES: normal, Conjunctiva are pink and non-injected, sclera clear OROPHARYNX: no exudate, no erythema and lips, buccal mucosa, and tongue normal  NECK: supple, thyroid normal size, non-tender, without nodularity LYMPH: no palpable lymphadenopathy in the cervical, axillary or inguinal LUNGS: clear to auscultation and percussion with normal breathing effort HEART: regular rate & rhythm and no murmurs and no lower extremity edema ABDOMEN: abdomen soft, non-tender and normal bowel sounds MUSCULOSKELETAL: no cyanosis of digits and no clubbing  NEURO: alert & oriented x 3 with fluent speech, no focal motor/sensory deficits EXTREMITIES: No lower extremity edema  LABORATORY DATA:  I have reviewed the data as listed CMP Latest Ref Rng & Units 04/12/2018 04/09/2018 03/23/2018  Glucose 70 - 99 mg/dL 86 97 122(H)  BUN 8 - 23 mg/dL _0 Creatinine 0.44 - 1.00 mg/dL 0.73 0.99 0.76  Sodium 135 - 145 mmol/L 139 130(L) 143  Potassium 3.5 - 5.1 mmol/L 3.7 3.9 3.7  Chloride 98 - 111 mmol/L 103 92(L) 104  CO2 22 - 32 mmol/L 30 25 32  Calcium 8.9 - 10.3 mg/dL 8.8(L) 8.2(L) 8.8(L)  Total Protein 6.5 - 8.1 g/dL - - 6.5  Total Bilirubin 0.3 - 1.2 mg/dL - - 0.4  Alkaline Phos 38 - 126 U/L - - 87  AST 15 - 41 U/L - - 40  ALT 0 - 44 U/L - - 42    Lab Results  Component Value Date   WBC 2.7 (L) 04/13/2018   HGB 11.5 (L) 04/13/2018   HCT 34.4 (L) 04/13/2018   MCV 93.7 04/13/2018   PLT 157 04/13/2018   NEUTROABS 1.5 (L) 04/13/2018    ASSESSMENT & PLAN:  Malignant neoplasm of upper-outer quadrant of left breast in female, estrogen receptor positive ([place]) 05/13/17: [place] biopsy: Left breast biopsy 2  o'clock position 8 cm from nipple ultrasound-guided biopsy: Grade 2 IDC, ER 50%, PR 3%, HER-2 equivocal Ki-67 15% nipple area biopsy  grade 2 IDC, ER 100% positive, PR negative, HER-2 equivocal, Ki-67 40%  06/11/17:Left Mastectomy: 2 foci of IDC 2.2 and 1 cm Grade 2, 1/2 LN Positive; Grade 2 IDC, ER 50%, PR 3%, HER-2 equivocal Ki-67 15% nipple area biopsy grade 2 IDC, ER 100% positive, PR negative, HER-2 equivocal, Ki-67 40% T2N1a Stage 1B I discussed with her that given the fact that she has lymph node positive disease, this is clinically high risk disease.  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, potential benefit of treatment at 5 years: 94.6% distant metastasis free interval for patients treated with chemotherapy  Recommendation: 1.Adjuvant chemotherapy with dose dense Adriamycin and Cytoxan x4 followed by Taxol weekly x6 (stopped for neuropathy)07/23/2017 to 11/10/2017 Herceptin adjuvant treatment started 12/29/2017 2.followed by radiation [place]:Completed 02/22/2018 3.Followed by adjuvant antiestrogen therapy with letrozole 2.5 mg daily x5 to 7 years ----------------------------------------------------------------------- Repeat assessment revealed HER-2 negative focus but also HER-2 positivefocus making her tumor heterogeneous.  Current treatment: Herceptinmaintenance therapy, anastrozole started 03/23/2018 Herceptin toxicities: 1.Initial infusion reaction: Severe chills and abdominal pain radiating to the back and a flushing sensation 2.Abdominal pain and alternating diarrhea with constipation  Anastrozole toxicities: No major side effects so far.  Emergency room visit for vaginal i irritation due to genital herpes: On Valtrex   Nausea due to doxycycline: In the emergency room she got IV fluids and Zofran, continues to have nausea today.  I will give her IV Zofran. We decided to hold off on Herceptin today to enable her to recover.  Received a message from [facility]  pulmonology about the lung changes and they may be proceeding to obtain a biopsy.  We will await those results.    No orders of the defined types were placed in this encounter.  The patient has a good understanding of the overall plan. she agrees with it. she will call with any problems that may develop before the next visit here.  Nicholas Lose, MD 04/13/2018  Julious Oka Dorshimer am acting as scribe for Dr. Nicholas Lose.  I have reviewed the above documentation for accuracy and completeness, and I agree with the above.

## 2018-04-13 NOTE — Patient Instructions (Signed)
Implanted Port Insertion, Care After  This sheet gives you information about how to care for yourself after your procedure. Your health care provider may also give you more specific instructions. If you have problems or questions, contact your health care provider.  What can I expect after the procedure?  After the procedure, it is common to have:  · Discomfort at the port insertion site.  · Bruising on the skin over the port. This should improve over 3-4 days.  Follow these instructions at home:  Port care  · After your port is placed, you will get a manufacturer's information card. The card has information about your port. Keep this card with you at all times.  · Take care of the port as told by your health care provider. Ask your health care provider if you or a family member can get training for taking care of the port at home. A home health care nurse may also take care of the port.  · Make sure to remember what type of port you have.  Incision care         · Follow instructions from your health care provider about how to take care of your port insertion site. Make sure you:  ? Wash your hands with soap and water before and after you change your bandage (dressing). If soap and water are not available, use hand sanitizer.  ? Change your dressing as told by your health care provider.  ? Leave stitches (sutures), skin glue, or adhesive strips in place. These skin closures may need to stay in place for 2 weeks or longer. If adhesive strip edges start to loosen and curl up, you may trim the loose edges. Do not remove adhesive strips completely unless your health care provider tells you to do that.  · Check your port insertion site every day for signs of infection. Check for:  ? Redness, swelling, or pain.  ? Fluid or blood.  ? Warmth.  ? Pus or a bad smell.  Activity  · Return to your normal activities as told by your health care provider. Ask your health care provider what activities are safe for you.  · Do not  lift anything that is heavier than 10 lb (4.5 kg), or the limit that you are told, until your health care provider says that it is safe.  General instructions  · Take over-the-counter and prescription medicines only as told by your health care provider.  · Do not take baths, swim, or use a hot tub until your health care provider approves. Ask your health care provider if you may take showers. You may only be allowed to take sponge baths.  · Do not drive for 24 hours if you were given a sedative during your procedure.  · Wear a medical alert bracelet in case of an emergency. This will tell any health care providers that you have a port.  · Keep all follow-up visits as told by your health care provider. This is important.  Contact a health care provider if:  · You cannot flush your port with saline as directed, or you cannot draw blood from the port.  · You have a fever or chills.  · You have redness, swelling, or pain around your port insertion site.  · You have fluid or blood coming from your port insertion site.  · Your port insertion site feels warm to the touch.  · You have pus or a bad smell coming from the port   insertion site.  Get help right away if:  · You have chest pain or shortness of breath.  · You have bleeding from your port that you cannot control.  Summary  · Take care of the port as told by your health care provider. Keep the manufacturer's information card with you at all times.  · Change your dressing as told by your health care provider.  · Contact a health care provider if you have a fever or chills or if you have redness, swelling, or pain around your port insertion site.  · Keep all follow-up visits as told by your health care provider.  This information is not intended to replace advice given to you by your health care provider. Make sure you discuss any questions you have with your health care provider.  Document Released: 12/14/2012 Document Revised: 09/21/2017 Document Reviewed:  09/21/2017  Elsevier Interactive Patient Education © 2019 [employer]

## 2018-04-13 NOTE — Assessment & Plan Note (Signed)
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, potential benefit of treatment at 5 years: 94.6% distant metastasis free interval for patients treated with chemotherapy  Recommendation: 1.Adjuvant chemotherapy with dose dense Adriamycin and Cytoxan x4 followed by Taxol weekly x6 (stopped for neuropathy)07/23/2017 to 11/10/2017 Herceptin adjuvant treatment started 12/29/2017 2.followed by radiation [place]:Completed 02/22/2018 3.Followed by adjuvant antiestrogen therapy with letrozole 2.5 mg daily x5 to 7 years ----------------------------------------------------------------------- Repeat assessment revealed HER-2 negative focus but also HER-2 positivefocus making her tumor heterogeneous.  Current treatment: Herceptinmaintenance therapy, anastrozole started 03/23/2018 Herceptin toxicities: 1.Initial infusion reaction: Severe chills and abdominal pain radiating to the back and a flushing sensation 2.Abdominal pain and alternating diarrhea with constipation  Anastrozole toxicities:   Emergency room visit for vaginal i irritation due to genital herpes: On Valtrex UTI: Doxycycline Nausea due to doxycycline: In the emergency room she got IV fluids and Zofran  Received a message from [facility] pulmonology about the lung changes and they are proceeding to obtain a biopsy.

## 2018-04-14 ENCOUNTER — Telehealth: Payer: Self-pay | Admitting: Hematology and Oncology

## 2018-04-14 ENCOUNTER — Encounter (HOSPITAL_COMMUNITY): Payer: Medicare Other

## 2018-04-14 LAB — CULTURE, BLOOD (ROUTINE X 2)
Culture: NO GROWTH
Culture: NO GROWTH
Special Requests: ADEQUATE

## 2018-04-14 NOTE — Telephone Encounter (Signed)
No los °

## 2018-04-19 ENCOUNTER — Encounter (HOSPITAL_COMMUNITY): Payer: Medicare Other

## 2018-04-21 ENCOUNTER — Encounter (HOSPITAL_COMMUNITY): Payer: Medicare Other

## 2018-04-21 ENCOUNTER — Encounter: Payer: Self-pay | Admitting: Hematology and Oncology

## 2018-04-26 ENCOUNTER — Encounter (HOSPITAL_COMMUNITY): Payer: Medicare Other

## 2018-04-28 ENCOUNTER — Encounter: Payer: Self-pay | Admitting: Hematology and Oncology

## 2018-04-28 ENCOUNTER — Encounter (HOSPITAL_COMMUNITY): Payer: Medicare Other

## 2018-05-03 ENCOUNTER — Encounter (HOSPITAL_COMMUNITY): Payer: Medicare Other

## 2018-05-03 NOTE — Progress Notes (Signed)
Patient Care Team: [name], MD as PCP - General (Family Medicine)  DIAGNOSIS:    ICD-10-CM   1. Malignant neoplasm of upper-outer quadrant of left breast in female, estrogen receptor positive ([place]) C50.412 DISCONTINUED: methylPREDNISolone sodium succinate (SOLU-MEDROL) 125 mg/2 mL injection 60 mg   Z17.0 DISCONTINUED: famotidine (PEPCID) IVPB 20 mg premix    SUMMARY OF ONCOLOGIC HISTORY:   Malignant neoplasm of upper-outer quadrant of left breast in female, estrogen receptor positive ([place])   05/13/2017 Initial Diagnosis    [place] biopsy: Left breast biopsy 2 o'clock position 8 cm from nipple ultrasound-guided biopsy: Grade 2 IDC, ER 50%, PR 3%, HER-2 equivocal Ki-67 15% nipple area biopsy grade 2 IDC, ER 100% positive, PR negative, HER-2 equivocal, Ki-67 40%    05/31/2017 Breast MRI    Left breast UOQ 230 position: 2 x 1.9 x 1.9 cm mass, second mass left breast UOQ 230 position: 1 x 1 x 0.7 cm, 2 more enhancing adjacent 3 and 5 mm nodules slightly superiorly midway between the 2 masses.  No abnormal lymph nodes, right breast normal    06/11/2017 Surgery    Left Mastectomy: 2 foci of IDC 2.2 and 1 cm Grade 2, 1/2 LN Positive; Grade 2 IDC, ER 50%, PR 3%, HER-2 equivocal Ki-67 15% nipple area biopsy grade 2 IDC, ER 100% positive, PR negative, HER-2 equivocal, Ki-67 40% T2N1a Stage 1B    06/25/2017 Miscellaneous    Mammaprint high risk luminal type B    07/23/2017 - 11/10/2017 Chemotherapy    Adjuvant chemotherapy with dose dense Adriamycin and Cytoxan x4 followed by Taxol weekly x6     12/10/2017 Pathology Results    Repeat analysis of pathology confirmed that patient has heterogeneous HER-2 signaling.  There is equivocal IHC 2+ and fish negative areas where the ratio was 1.49 and copy number of 2.9.  There is one area that is HER-2 +3+ by [facility]    12/28/2017 - 02/22/2018 Radiation Therapy    Ad[facility] at [facility]    12/29/2017 -  Chemotherapy    The patient had trastuzumab  (HERCEPTIN) 798 mg in sodium chloride 0.9 % 250 mL chemo infusion, 8 mg/kg = 798 mg, Intravenous,  Once, 6 of 18 cycles Administration: 798 mg (12/29/2017), 588 mg (01/19/2018), 600 mg (03/23/2018), 600 mg (02/09/2018), 600 mg (03/03/2018)  for chemotherapy treatment.     03/23/2018 -  Anti-estrogen oral therapy    Anastrozole, 51m daily     CHIEF COMPLIANT: Follow-up of anastrozole and Herceptin  INTERVAL HISTORY: Breanna RACINEis a 73y.o. with above-mentioned history of HER-2 positive breast cancer who is currently on Herceptin maintenance and anti-estrogen therapy with anastrozole. She presents to the clinic today with her husband. She notes vaginal soreness and occasional bleeding in addition to pressure which is uncomfortable. She notes severe shingles back pain for which Lyrica has been helpful, and she requested to increase her dose. She notes confusion about contamination and ways her herpes could spread and what precautions she should take. She stopped taking anastrozole because of her shingles flare up but will resume next week. Her labs from today show: WBC 3.2, Hg 12.0, platelets 148, ANC 1.7.  REVIEW OF SYSTEMS:   Constitutional: Denies fevers, chills or abnormal weight loss Eyes: Denies blurriness of vision Ears, nose, mouth, throat, and face: Denies mucositis or sore throat Respiratory: Denies cough, dyspnea or wheezes Cardiovascular: Denies palpitation, chest discomfort Gastrointestinal: Denies nausea, heartburn or change in bowel habits Skin: Denies abnormal  skin rashes GYN: (+) vaginal soreness and occasional bleeding MSK: (+) back pain, shingles Lymphatics: Denies new lymphadenopathy or easy bruising Neurological: Denies numbness, tingling or new weaknesses Behavioral/Psych: Mood is stable, no new changes  Extremities: No lower extremity edema Breast: denies any pain or lumps or nodules in either breasts All other systems were reviewed with the patient and are  negative.  I have reviewed the past medical history, past surgical history, social history and family history with the patient and they are unchanged from previous note.  ALLERGIES:  is allergic to atorvastatin; vancomycin; ancef [cefazolin]; lactose; nasonex [mometasone furoate]; and neurontin [gabapentin].  MEDICATIONS:  Current Outpatient Medications  Medication Sig Dispense Refill  . acetaminophen (TYLENOL) 500 MG tablet Take 1,000 mg by mouth 3 (three) times daily as needed for headache (pain).     Marland Kitchen amLODipine (NORVASC) 5 MG tablet Take 5 mg by mouth daily.     Marland Kitchen anastrozole (ARIMIDEX) 1 MG tablet Take 1 tablet (1 mg total) by mouth daily. 90 tablet 3  . atorvastatin (LIPITOR) 20 MG tablet Take 20 mg by mouth at bedtime.    . B Complex-C (SUPER B COMPLEX PO) Take 1 tablet by mouth daily.    . bumetanide (BUMEX) 1 MG tablet Take 1 tablet (1 mg total) by mouth daily. 30 tablet 3  . Calcium Carb-Cholecalciferol (CALCIUM 600-D PO) Take 1 tablet by mouth at bedtime.    . carvedilol (COREG) 25 MG tablet Take 1 tablet (25 mg total) by mouth 2 (two) times daily with a meal. 60 tablet 0  . cetirizine (ZYRTEC) 10 MG tablet Take 10 mg by mouth at bedtime.     . clopidogrel (PLAVIX) 75 MG tablet Take 1 tablet (75 mg total) by mouth every evening. (Patient taking differently: Take 75 mg by mouth daily. ) 30 tablet 0  . diclofenac sodium (VOLTAREN) 1 % GEL Apply 4 g topically at bedtime. 100 g 6  . diphenoxylate-atropine (LOMOTIL) 2.5-0.025 MG tablet Take 1 tablet by mouth 4 (four) times daily as needed for diarrhea or loose stools. (Patient not taking: Reported on 04/09/2018) 30 tablet 0  . doxycycline (VIBRAMYCIN) 100 MG capsule Take 1 capsule (100 mg total) by mouth 2 (two) times daily. One po bid x 7 days 14 capsule 0  . ferrous sulfate 325 (65 FE) MG tablet Take 325 mg by mouth daily with breakfast.    . fexofenadine (ALLEGRA) 180 MG tablet Take 180 mg by mouth daily.    . fluticasone (FLONASE) 50  MCG/ACT nasal spray Place 1 spray into both nostrils daily as needed (seasonal allergies).     Marland Kitchen ipratropium (ATROVENT) 0.03 % nasal spray Place 2 sprays into both nostrils every 8 (eight) hours.    . lansoprazole (PREVACID) 15 MG capsule Take 15 mg by mouth daily.     Marland Kitchen lidocaine-prilocaine (EMLA) cream Apply 1 application topically as needed. 30 g 0  . lisinopril (PRINIVIL,ZESTRIL) 40 MG tablet Take 40 mg by mouth daily.    . ondansetron (ZOFRAN ODT) 4 MG disintegrating tablet Take 1 tablet (4 mg total) by mouth every 8 (eight) hours as needed for nausea. 15 tablet 0  . ondansetron (ZOFRAN) 8 MG tablet Take 1 tablet (8 mg total) by mouth every 8 (eight) hours as needed for nausea or vomiting. (Patient not taking: Reported on 04/09/2018) 20 tablet 0  . PARoxetine (PAXIL) 10 MG tablet Take 10 mg by mouth at bedtime.     Vladimir Faster Glycol-Propyl Glycol (SYSTANE  OP) Place 1 drop into both eyes daily as needed (for dry eyes).     . potassium chloride (K-DUR,KLOR-CON) 10 MEQ tablet Take 10 mEq by mouth at bedtime.     . pregabalin (LYRICA) 50 MG capsule Take 1 capsule (50 mg total) by mouth 2 (two) times daily. 60 capsule 3  . sulfamethoxazole-trimethoprim (BACTRIM DS,SEPTRA DS) 800-160 MG tablet Take 1 tablet by mouth 2 (two) times daily.    . traMADol (ULTRAM) 50 MG tablet Take 1 tablet (50 mg total) by mouth 2 (two) times daily as needed for moderate pain (mild pain). 20 tablet 0   No current facility-administered medications for this visit.    Facility-Administered Medications Ordered in Other Visits  Medication Dose Route Frequency Provider Last Rate Last Dose  . heparin lock flush 100 unit/mL  500 Units Intracatheter Once PRN Gudena, Vinay, MD      . sodium chloride flush (NS) 0.9 % injection 10 mL  10 mL Intracatheter PRN Gudena, Vinay, MD      . trastuzumab (HERCEPTIN) 600 mg in sodium chloride 0.9 % 250 mL chemo infusion  600 mg Intravenous Once Gudena, Vinay, MD        PHYSICAL  EXAMINATION: ECOG PERFORMANCE STATUS: 1 - Symptomatic but completely ambulatory  Vitals:   05/04/18 1425  BP: (!) 105/46  Pulse: 66  Resp: 18  Temp: 97.6 F (36.4 C)  SpO2: 94%   Filed Weights   05/04/18 1425  Weight: 216 lb (98 kg)    GENERAL: alert, no distress and comfortable SKIN: skin color, texture, turgor are normal, no rashes or significant lesions EYES: normal, Conjunctiva are pink and non-injected, sclera clear OROPHARYNX: no exudate, no erythema and lips, buccal mucosa, and tongue normal  NECK: supple, thyroid normal size, non-tender, without nodularity LYMPH: no palpable lymphadenopathy in the cervical, axillary or inguinal LUNGS: clear to auscultation and percussion with normal breathing effort HEART: regular rate & rhythm and no murmurs and no lower extremity edema ABDOMEN: abdomen soft, non-tender and normal bowel sounds MUSCULOSKELETAL: no cyanosis of digits and no clubbing  NEURO: alert & oriented x 3 with fluent speech, no focal motor/sensory deficits EXTREMITIES: No lower extremity edema  LABORATORY DATA:  I have reviewed the data as listed CMP Latest Ref Rng & Units 05/04/2018 04/13/2018 04/12/2018  Glucose 70 - 99 mg/dL 131(H) 110(H) 86  BUN 8 - 23 mg/dL 14 7(L) 10  Creatinine 0.44 - 1.00 mg/dL 0.71 0.67 0.73  Sodium 135 - 145 mmol/L 141 141 139  Potassium 3.5 - 5.1 mmol/L 3.7 4.3 3.7  Chloride 98 - 111 mmol/L 103 104 103  CO2 22 - 32 mmol/L 31 31 30  Calcium 8.9 - 10.3 mg/dL 8.8(L) 9.1 8.8(L)  Total Protein 6.5 - 8.1 g/dL 6.6 6.5 -  Total Bilirubin 0.3 - 1.2 mg/dL 0.3 0.4 -  Alkaline Phos 38 - 126 U/L 93 68 -  AST 15 - 41 U/L 37 53(H) -  ALT 0 - 44 U/L 33 38 -    Lab Results  Component Value Date   WBC 3.2 (L) 05/04/2018   HGB 12.0 05/04/2018   HCT 36.6 05/04/2018   MCV 98.1 05/04/2018   PLT 148 (L) 05/04/2018   NEUTROABS 1.7 05/04/2018    ASSESSMENT & PLAN:  Malignant neoplasm of upper-outer quadrant of left breast in female, estrogen  receptor positive ([facility]) 05/13/17: [place] biopsy: Left breast biopsy 2 o'clock position 8 cm from nipple ultrasound-guided biopsy: Grade 2 IDC, ER 50%,   PR 3%, HER-2 equivocal Ki-67 15% nipple area biopsy grade 2 IDC, ER 100% positive, PR negative, HER-2 equivocal, Ki-67 40%  06/11/17:Left Mastectomy: 2 foci of IDC 2.2 and 1 cm Grade 2, 1/2 LN Positive; Grade 2 IDC, ER 50%, PR 3%, HER-2 equivocal Ki-67 15% nipple area biopsy grade 2 IDC, ER 100% positive, PR negative, HER-2 equivocal, Ki-67 40% T2N1a Stage 1B I discussed with her that given the fact that she has lymph node positive disease, this is clinically high risk disease.  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, potential benefit of treatment at 5 years: 94.6% distant metastasis free interval for patients treated with chemotherapy  Recommendation: 1.Adjuvant chemotherapy with dose dense Adriamycin and Cytoxan x4 followed by Taxol weekly x6 (stopped for neuropathy)07/23/2017 to 11/10/2017 Herceptin adjuvant treatment started 12/29/2017 2.followed by radiation [place]:Completed 02/22/2018 3.Followed by adjuvant antiestrogen therapy with letrozole 2.5 mg daily x5 to 7 years ----------------------------------------------------------------------- Repeat assessment revealed HER-2 negative focus but also HER-2 positivefocus making her tumor heterogeneous.  Current treatment: Herceptinmaintenance therapy,anastrozole started 03/23/2018  Herceptin toxicities: 1.Initial infusion reaction: Severe chills and abdominal pain radiating to the back and a flushing sensation 2.Abdominal pain and alternating diarrhea with constipation  Anastrozole toxicities: No major side effects so far.  Emergency room visit for vaginal irritation due to genital herpes: Completed Valtrex We will resume Herceptin today. She will also resume anastrozole therapy.  Return to clinic every 3 weeks for Herceptin every 6 weeks for follow-up with  me.   No orders of the defined types were placed in this encounter.  The patient has a good understanding of the overall plan. she agrees with it. she will call with any problems that may develop before the next visit here.  Nicholas Lose, MD 05/04/2018  Julious Oka Dorshimer am acting as scribe for Dr. Nicholas Lose.  I have reviewed the above documentation for accuracy and completeness, and I agree with the above.

## 2018-05-04 ENCOUNTER — Inpatient Hospital Stay: Payer: Medicare Other

## 2018-05-04 ENCOUNTER — Inpatient Hospital Stay (HOSPITAL_BASED_OUTPATIENT_CLINIC_OR_DEPARTMENT_OTHER): Payer: Medicare Other | Admitting: Hematology and Oncology

## 2018-05-04 DIAGNOSIS — Z79899 Other long term (current) drug therapy: Secondary | ICD-10-CM

## 2018-05-04 DIAGNOSIS — C50412 Malignant neoplasm of upper-outer quadrant of left female breast: Secondary | ICD-10-CM

## 2018-05-04 DIAGNOSIS — Z9012 Acquired absence of left breast and nipple: Secondary | ICD-10-CM

## 2018-05-04 DIAGNOSIS — Z9221 Personal history of antineoplastic chemotherapy: Secondary | ICD-10-CM | POA: Diagnosis not present

## 2018-05-04 DIAGNOSIS — Z923 Personal history of irradiation: Secondary | ICD-10-CM

## 2018-05-04 DIAGNOSIS — Z95828 Presence of other vascular implants and grafts: Secondary | ICD-10-CM

## 2018-05-04 DIAGNOSIS — Z170 Estrogen receptor positive status [ER+]: Secondary | ICD-10-CM | POA: Diagnosis not present

## 2018-05-04 DIAGNOSIS — Z5112 Encounter for antineoplastic immunotherapy: Secondary | ICD-10-CM | POA: Diagnosis not present

## 2018-05-04 DIAGNOSIS — Z79811 Long term (current) use of aromatase inhibitors: Secondary | ICD-10-CM

## 2018-05-04 LAB — CMP (CANCER CENTER ONLY)
ALT: 33 U/L (ref 0–44)
AST: 37 U/L (ref 15–41)
Albumin: 3.5 g/dL (ref 3.5–5.0)
Alkaline Phosphatase: 93 U/L (ref 38–126)
Anion gap: 7 (ref 5–15)
BUN: 14 mg/dL (ref 8–23)
CHLORIDE: 103 mmol/L (ref 98–111)
CO2: 31 mmol/L (ref 22–32)
Calcium: 8.8 mg/dL — ABNORMAL LOW (ref 8.9–10.3)
Creatinine: 0.71 mg/dL (ref 0.44–1.00)
GFR, Est AFR Am: >60 mL/min (ref >60)
GFR, Estimated: >60 mL/min (ref >60)
Glucose, Bld: 131 mg/dL — ABNORMAL HIGH (ref 70–99)
Potassium: 3.7 mmol/L (ref 3.5–5.1)
Sodium: 141 mmol/L (ref 135–145)
Total Bilirubin: 0.3 mg/dL (ref 0.3–1.2)
Total Protein: 6.6 g/dL (ref 6.5–8.1)

## 2018-05-04 LAB — CBC WITH DIFFERENTIAL (CANCER CENTER ONLY)
Abs Immature Granulocytes: 0.01 10*3/uL (ref 0.00–0.07)
Basophils Absolute: 0 10*3/uL (ref 0.0–0.1)
Basophils Relative: 1 %
Eosinophils Absolute: 0.1 10*3/uL (ref 0.0–0.5)
Eosinophils Relative: 3 %
HCT: 36.6 % (ref 36.0–46.0)
Hemoglobin: 12 g/dL (ref 12.0–15.0)
Immature Granulocytes: 0 %
Lymphocytes Relative: 26 %
Lymphs Abs: 0.8 10*3/uL (ref 0.7–4.0)
MCH: 32.2 pg (ref 26.0–34.0)
MCHC: 32.8 g/dL (ref 30.0–36.0)
MCV: 98.1 fL (ref 80.0–100.0)
Monocytes Absolute: 0.5 10*3/uL (ref 0.1–1.0)
Monocytes Relative: 14 %
NEUTROS PCT: 56 %
Neutro Abs: 1.7 10*3/uL (ref 1.7–7.7)
Platelet Count: 148 10*3/uL — ABNORMAL LOW (ref 150–400)
RBC: 3.73 MIL/uL — ABNORMAL LOW (ref 3.87–5.11)
RDW: 14.5 % (ref 11.5–15.5)
WBC Count: 3.2 10*3/uL — ABNORMAL LOW (ref 4.0–10.5)
nRBC: 0 % (ref 0.0–0.2)

## 2018-05-04 MED ORDER — SODIUM CHLORIDE 0.9% FLUSH
10.0000 mL | INTRAVENOUS | Status: DC | PRN
  Administered 2018-05-04: 10 mL
  Filled 2018-05-04: qty 10

## 2018-05-04 MED ORDER — HEPARIN SOD (PORK) LOCK FLUSH 100 UNIT/ML IV SOLN
500.0000 [IU] | Freq: Once | INTRAVENOUS | Status: AC | PRN
  Administered 2018-05-04: 500 [IU]
  Filled 2018-05-04: qty 5

## 2018-05-04 MED ORDER — DIPHENHYDRAMINE HCL 25 MG PO CAPS
50.0000 mg | ORAL_CAPSULE | Freq: Once | ORAL | Status: AC
  Administered 2018-05-04: 50 mg via ORAL

## 2018-05-04 MED ORDER — DIPHENHYDRAMINE HCL 25 MG PO CAPS
ORAL_CAPSULE | ORAL | Status: AC
  Filled 2018-05-04: qty 2

## 2018-05-04 MED ORDER — PREGABALIN 50 MG PO CAPS: 50.0000 mg | ORAL_CAPSULE | Freq: Two times a day (BID) | ORAL | 3 refills | Status: DC

## 2018-05-04 MED ORDER — METHYLPREDNISOLONE SODIUM SUCC 125 MG IJ SOLR
INTRAMUSCULAR | Status: AC
  Filled 2018-05-04: qty 2

## 2018-05-04 MED ORDER — TRASTUZUMAB CHEMO 150 MG IV SOLR
600.0000 mg | Freq: Once | INTRAVENOUS | Status: AC
  Administered 2018-05-04: 600 mg via INTRAVENOUS
  Filled 2018-05-04: qty 28.57

## 2018-05-04 MED ORDER — ACETAMINOPHEN 325 MG PO TABS
ORAL_TABLET | ORAL | Status: AC
  Filled 2018-05-04: qty 2

## 2018-05-04 MED ORDER — SODIUM CHLORIDE 0.9 % IV SOLN
Freq: Once | INTRAVENOUS | Status: AC
  Administered 2018-05-04: 15:00:00 via INTRAVENOUS
  Filled 2018-05-04: qty 250

## 2018-05-04 MED ORDER — ACETAMINOPHEN 325 MG PO TABS
650.0000 mg | ORAL_TABLET | Freq: Once | ORAL | Status: AC
  Administered 2018-05-04: 650 mg via ORAL

## 2018-05-04 MED ORDER — SODIUM CHLORIDE 0.9 % IV SOLN
20.0000 mg | Freq: Once | INTRAVENOUS | Status: AC
  Administered 2018-05-04: 20 mg via INTRAVENOUS
  Filled 2018-05-04: qty 2

## 2018-05-04 MED ORDER — METHYLPREDNISOLONE SODIUM SUCC 125 MG IJ SOLR
60.0000 mg | Freq: Once | INTRAMUSCULAR | Status: AC
  Administered 2018-05-04: 60 mg via INTRAVENOUS

## 2018-05-04 MED ORDER — FAMOTIDINE IN NACL 20-0.9 MG/50ML-% IV SOLN: 20.0000 mg | Freq: Once | INTRAVENOUS | Status: DC

## 2018-05-04 NOTE — Patient Instructions (Signed)
[facility] Discharge Instructions for Patients Receiving Chemotherapy  Today you received the following chemotherapy agents Herceptin  To help prevent nausea and vomiting after your treatment, we encourage you to take your nausea medication as directed   If you develop nausea and vomiting that is not controlled by your nausea medication, call the clinic.   BELOW ARE SYMPTOMS THAT SHOULD BE REPORTED IMMEDIATELY:  *FEVER GREATER THAN 100.5 F  *CHILLS WITH OR WITHOUT FEVER  NAUSEA AND VOMITING THAT IS NOT CONTROLLED WITH YOUR NAUSEA MEDICATION  *UNUSUAL SHORTNESS OF BREATH  *UNUSUAL BRUISING OR BLEEDING  TENDERNESS IN MOUTH AND THROAT WITH OR WITHOUT PRESENCE OF ULCERS  *URINARY PROBLEMS  *BOWEL PROBLEMS  UNUSUAL RASH Items with * indicate a potential emergency and should be followed up as soon as possible.  Feel free to call the clinic should you have any questions or concerns. The clinic phone number is [phone].  Please show the CHEMO ALERT CARD at check-in to the Emergency Department and triage nurse.   

## 2018-05-04 NOTE — Progress Notes (Signed)
No reload on Herceptin today per MD.  Hardie Pulley, PharmD, BCPS, BCOP

## 2018-05-04 NOTE — Assessment & Plan Note (Signed)
05/13/17: [place] biopsy: Left breast biopsy 2 o'clock position 8 cm from nipple ultrasound-guided biopsy: Grade 2 IDC, ER 50%, PR 3%, HER-2 equivocal Ki-67 15% nipple area biopsy grade 2 IDC, ER 100% positive, PR negative, HER-2 equivocal, Ki-67 40%  06/11/17:Left Mastectomy: 2 foci of IDC 2.2 and 1 cm Grade 2, 1/2 LN Positive; Grade 2 IDC, ER 50%, PR 3%, HER-2 equivocal Ki-67 15% nipple area biopsy grade 2 IDC, ER 100% positive, PR negative, HER-2 equivocal, Ki-67 40% T2N1a Stage 1B I discussed with her that given the fact that she has lymph node positive disease, this is clinically high risk disease.  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, potential benefit of treatment at 5 years: 94.6% distant metastasis free interval for patients treated with chemotherapy  Recommendation: 1.Adjuvant chemotherapy with dose dense Adriamycin and Cytoxan x4 followed by Taxol weekly x6 (stopped for neuropathy)07/23/2017 to 11/10/2017 Herceptin adjuvant treatment started 12/29/2017 2.followed by radiation [place]:Completed 02/22/2018 3.Followed by adjuvant antiestrogen therapy with letrozole 2.5 mg daily x5 to 7 years ----------------------------------------------------------------------- Repeat assessment revealed HER-2 negative focus but also HER-2 positivefocus making her tumor heterogeneous.  Current treatment: Herceptinmaintenance therapy,anastrozole started 03/23/2018  Herceptin toxicities: 1.Initial infusion reaction: Severe chills and abdominal pain radiating to the back and a flushing sensation 2.Abdominal pain and alternating diarrhea with constipation  Anastrozole toxicities: No major side effects so far.  Emergency room visit for vaginal i irritation due to genital herpes: On Valtrex   Nausea due to doxycycline: Received a message from [facility] pulmonology about the lung changes and they may be proceeding to obtain a biopsy.  We will await those results.

## 2018-05-05 ENCOUNTER — Encounter (HOSPITAL_COMMUNITY): Payer: Medicare Other

## 2018-05-05 ENCOUNTER — Other Ambulatory Visit: Payer: Self-pay | Admitting: Hematology and Oncology

## 2018-05-10 ENCOUNTER — Encounter (HOSPITAL_COMMUNITY): Payer: Medicare Other

## 2018-05-12 ENCOUNTER — Encounter (HOSPITAL_COMMUNITY): Payer: Medicare Other

## 2018-05-17 ENCOUNTER — Encounter (HOSPITAL_COMMUNITY): Payer: Medicare Other

## 2018-05-19 ENCOUNTER — Encounter (HOSPITAL_COMMUNITY): Payer: Medicare Other

## 2018-05-24 ENCOUNTER — Encounter (HOSPITAL_COMMUNITY): Payer: Medicare Other

## 2018-05-25 ENCOUNTER — Inpatient Hospital Stay: Payer: Medicare Other

## 2018-05-25 ENCOUNTER — Other Ambulatory Visit: Payer: Medicare Other

## 2018-05-26 ENCOUNTER — Encounter (HOSPITAL_COMMUNITY): Payer: Medicare Other

## 2018-05-26 ENCOUNTER — Inpatient Hospital Stay: Payer: Medicare Other | Attending: Hematology and Oncology

## 2018-05-26 ENCOUNTER — Other Ambulatory Visit: Payer: Self-pay

## 2018-05-26 VITALS — BP 108/62 | HR 65 | Temp 97.6°F | Resp 18

## 2018-05-26 DIAGNOSIS — C50412 Malignant neoplasm of upper-outer quadrant of left female breast: Secondary | ICD-10-CM | POA: Diagnosis not present

## 2018-05-26 DIAGNOSIS — Z79899 Other long term (current) drug therapy: Secondary | ICD-10-CM | POA: Insufficient documentation

## 2018-05-26 DIAGNOSIS — Z9221 Personal history of antineoplastic chemotherapy: Secondary | ICD-10-CM | POA: Insufficient documentation

## 2018-05-26 DIAGNOSIS — Z5112 Encounter for antineoplastic immunotherapy: Secondary | ICD-10-CM | POA: Insufficient documentation

## 2018-05-26 DIAGNOSIS — Z79811 Long term (current) use of aromatase inhibitors: Secondary | ICD-10-CM | POA: Diagnosis not present

## 2018-05-26 DIAGNOSIS — Z923 Personal history of irradiation: Secondary | ICD-10-CM | POA: Insufficient documentation

## 2018-05-26 DIAGNOSIS — Z170 Estrogen receptor positive status [ER+]: Secondary | ICD-10-CM | POA: Insufficient documentation

## 2018-05-26 DIAGNOSIS — Z9012 Acquired absence of left breast and nipple: Secondary | ICD-10-CM | POA: Insufficient documentation

## 2018-05-26 MED ORDER — SODIUM CHLORIDE 0.9 % IV SOLN
Freq: Once | INTRAVENOUS | Status: AC
  Administered 2018-05-26: 15:00:00 via INTRAVENOUS
  Filled 2018-05-26: qty 250

## 2018-05-26 MED ORDER — METHYLPREDNISOLONE SODIUM SUCC 125 MG IJ SOLR
60.0000 mg | Freq: Once | INTRAMUSCULAR | Status: AC
  Administered 2018-05-26: 60 mg via INTRAVENOUS

## 2018-05-26 MED ORDER — ACETAMINOPHEN 325 MG PO TABS
650.0000 mg | ORAL_TABLET | Freq: Once | ORAL | Status: AC
  Administered 2018-05-26: 650 mg via ORAL

## 2018-05-26 MED ORDER — DIPHENHYDRAMINE HCL 25 MG PO CAPS
50.0000 mg | ORAL_CAPSULE | Freq: Once | ORAL | Status: AC
  Administered 2018-05-26: 50 mg via ORAL

## 2018-05-26 MED ORDER — ACETAMINOPHEN 325 MG PO TABS
ORAL_TABLET | ORAL | Status: AC
  Filled 2018-05-26: qty 2

## 2018-05-26 MED ORDER — SODIUM CHLORIDE 0.9% FLUSH
10.0000 mL | INTRAVENOUS | Status: DC | PRN
  Administered 2018-05-26: 10 mL
  Filled 2018-05-26: qty 10

## 2018-05-26 MED ORDER — TRASTUZUMAB CHEMO 150 MG IV SOLR
600.0000 mg | Freq: Once | INTRAVENOUS | Status: AC
  Administered 2018-05-26: 600 mg via INTRAVENOUS
  Filled 2018-05-26: qty 28.57

## 2018-05-26 MED ORDER — HEPARIN SOD (PORK) LOCK FLUSH 100 UNIT/ML IV SOLN
500.0000 [IU] | Freq: Once | INTRAVENOUS | Status: AC | PRN
  Administered 2018-05-26: 500 [IU]
  Filled 2018-05-26: qty 5

## 2018-05-26 MED ORDER — SODIUM CHLORIDE 0.9 % IV SOLN
20.0000 mg | Freq: Once | INTRAVENOUS | Status: AC
  Administered 2018-05-26: 20 mg via INTRAVENOUS
  Filled 2018-05-26: qty 2

## 2018-05-26 MED ORDER — FAMOTIDINE IN NACL 20-0.9 MG/50ML-% IV SOLN: 20.0000 mg | Freq: Once | INTRAVENOUS | Status: DC

## 2018-05-26 MED ORDER — METHYLPREDNISOLONE SODIUM SUCC 125 MG IJ SOLR
INTRAMUSCULAR | Status: AC
  Filled 2018-05-26: qty 2

## 2018-05-26 MED ORDER — DIPHENHYDRAMINE HCL 25 MG PO CAPS
ORAL_CAPSULE | ORAL | Status: AC
  Filled 2018-05-26: qty 2

## 2018-05-26 NOTE — Patient Instructions (Signed)
[facility] Discharge Instructions for Patients Receiving Chemotherapy  Today you received the following chemotherapy agents Herceptin  To help prevent nausea and vomiting after your treatment, we encourage you to take your nausea medication as directed   If you develop nausea and vomiting that is not controlled by your nausea medication, call the clinic.   BELOW ARE SYMPTOMS THAT SHOULD BE REPORTED IMMEDIATELY:  *FEVER GREATER THAN 100.5 F  *CHILLS WITH OR WITHOUT FEVER  NAUSEA AND VOMITING THAT IS NOT CONTROLLED WITH YOUR NAUSEA MEDICATION  *UNUSUAL SHORTNESS OF BREATH  *UNUSUAL BRUISING OR BLEEDING  TENDERNESS IN MOUTH AND THROAT WITH OR WITHOUT PRESENCE OF ULCERS  *URINARY PROBLEMS  *BOWEL PROBLEMS  UNUSUAL RASH Items with * indicate a potential emergency and should be followed up as soon as possible.  Feel free to call the clinic should you have any questions or concerns. The clinic phone number is [phone].  Please show the CHEMO ALERT CARD at check-in to the Emergency Department and triage nurse.   

## 2018-05-30 ENCOUNTER — Other Ambulatory Visit: Payer: Self-pay | Admitting: Hematology and Oncology

## 2018-05-31 ENCOUNTER — Encounter (HOSPITAL_COMMUNITY): Payer: Medicare Other

## 2018-06-02 ENCOUNTER — Other Ambulatory Visit: Payer: Self-pay | Admitting: Hematology and Oncology

## 2018-06-02 ENCOUNTER — Encounter (HOSPITAL_COMMUNITY): Payer: Medicare Other

## 2018-06-07 ENCOUNTER — Encounter (HOSPITAL_COMMUNITY): Payer: Medicare Other

## 2018-06-09 ENCOUNTER — Encounter (HOSPITAL_COMMUNITY): Payer: Medicare Other

## 2018-06-14 ENCOUNTER — Inpatient Hospital Stay: Payer: Medicare Other

## 2018-06-14 ENCOUNTER — Encounter: Payer: Self-pay | Admitting: Adult Health

## 2018-06-14 ENCOUNTER — Other Ambulatory Visit: Payer: Self-pay

## 2018-06-14 ENCOUNTER — Inpatient Hospital Stay: Payer: Medicare Other | Attending: Hematology and Oncology | Admitting: Adult Health

## 2018-06-14 VITALS — BP 122/64 | HR 59 | Temp 98.0°F | Resp 18 | Ht 65.0 in | Wt 213.7 lb

## 2018-06-14 DIAGNOSIS — I890 Lymphedema, not elsewhere classified: Secondary | ICD-10-CM | POA: Diagnosis not present

## 2018-06-14 DIAGNOSIS — Z9221 Personal history of antineoplastic chemotherapy: Secondary | ICD-10-CM | POA: Diagnosis not present

## 2018-06-14 DIAGNOSIS — B009 Herpesviral infection, unspecified: Secondary | ICD-10-CM | POA: Diagnosis not present

## 2018-06-14 DIAGNOSIS — Z79899 Other long term (current) drug therapy: Secondary | ICD-10-CM | POA: Insufficient documentation

## 2018-06-14 DIAGNOSIS — Z923 Personal history of irradiation: Secondary | ICD-10-CM | POA: Diagnosis not present

## 2018-06-14 DIAGNOSIS — C50412 Malignant neoplasm of upper-outer quadrant of left female breast: Secondary | ICD-10-CM

## 2018-06-14 DIAGNOSIS — Z170 Estrogen receptor positive status [ER+]: Principal | ICD-10-CM

## 2018-06-14 DIAGNOSIS — Z95828 Presence of other vascular implants and grafts: Secondary | ICD-10-CM

## 2018-06-14 DIAGNOSIS — Z9012 Acquired absence of left breast and nipple: Secondary | ICD-10-CM | POA: Diagnosis not present

## 2018-06-14 DIAGNOSIS — Z5112 Encounter for antineoplastic immunotherapy: Secondary | ICD-10-CM | POA: Diagnosis present

## 2018-06-14 LAB — CBC WITH DIFFERENTIAL (CANCER CENTER ONLY)
Abs Immature Granulocytes: 0 10*3/uL (ref 0.00–0.07)
Basophils Absolute: 0 10*3/uL (ref 0.0–0.1)
Basophils Relative: 1 %
Eosinophils Absolute: 0 10*3/uL (ref 0.0–0.5)
Eosinophils Relative: 1 %
HCT: 35.9 % — ABNORMAL LOW (ref 36.0–46.0)
Hemoglobin: 11.8 g/dL — ABNORMAL LOW (ref 12.0–15.0)
Immature Granulocytes: 0 %
Lymphocytes Relative: 27 %
Lymphs Abs: 0.9 10*3/uL (ref 0.7–4.0)
MCH: 32.5 pg (ref 26.0–34.0)
MCHC: 32.9 g/dL (ref 30.0–36.0)
MCV: 98.9 fL (ref 80.0–100.0)
Monocytes Absolute: 0.4 10*3/uL (ref 0.1–1.0)
Monocytes Relative: 13 %
Neutro Abs: 1.9 10*3/uL (ref 1.7–7.7)
Neutrophils Relative %: 58 %
Platelet Count: 154 10*3/uL (ref 150–400)
RBC: 3.63 MIL/uL — ABNORMAL LOW (ref 3.87–5.11)
RDW: 13.2 % (ref 11.5–15.5)
WBC Count: 3.2 10*3/uL — ABNORMAL LOW (ref 4.0–10.5)
nRBC: 0 % (ref 0.0–0.2)

## 2018-06-14 LAB — CMP (CANCER CENTER ONLY)
ALT: 26 U/L (ref 0–44)
AST: 29 U/L (ref 15–41)
Albumin: 3.5 g/dL (ref 3.5–5.0)
Alkaline Phosphatase: 83 U/L (ref 38–126)
Anion gap: 7 (ref 5–15)
BUN: 15 mg/dL (ref 8–23)
CO2: 34 mmol/L — ABNORMAL HIGH (ref 22–32)
Calcium: 9.2 mg/dL (ref 8.9–10.3)
Chloride: 102 mmol/L (ref 98–111)
Creatinine: 0.78 mg/dL (ref 0.44–1.00)
GFR, Est AFR Am: >60 mL/min (ref >60)
GFR, Estimated: >60 mL/min (ref >60)
Glucose, Bld: 100 mg/dL — ABNORMAL HIGH (ref 70–99)
Potassium: 3.8 mmol/L (ref 3.5–5.1)
Sodium: 143 mmol/L (ref 135–145)
Total Bilirubin: 0.4 mg/dL (ref 0.3–1.2)
Total Protein: 6.5 g/dL (ref 6.5–8.1)

## 2018-06-14 MED ORDER — SODIUM CHLORIDE 0.9 % IV SOLN
20.0000 mg | Freq: Once | INTRAVENOUS | Status: AC
  Administered 2018-06-14: 20 mg via INTRAVENOUS
  Filled 2018-06-14: qty 2

## 2018-06-14 MED ORDER — SODIUM CHLORIDE 0.9% FLUSH
10.0000 mL | INTRAVENOUS | Status: DC | PRN
  Administered 2018-06-14: 10 mL
  Filled 2018-06-14: qty 10

## 2018-06-14 MED ORDER — DIPHENHYDRAMINE HCL 25 MG PO CAPS
ORAL_CAPSULE | ORAL | Status: AC
  Filled 2018-06-14: qty 2

## 2018-06-14 MED ORDER — FAMOTIDINE IN NACL 20-0.9 MG/50ML-% IV SOLN: 20.0000 mg | Freq: Once | INTRAVENOUS | Status: DC

## 2018-06-14 MED ORDER — TRASTUZUMAB CHEMO 150 MG IV SOLR
600.0000 mg | Freq: Once | INTRAVENOUS | Status: AC
  Administered 2018-06-14: 600 mg via INTRAVENOUS
  Filled 2018-06-14: qty 28.57

## 2018-06-14 MED ORDER — ACETAMINOPHEN 325 MG PO TABS
650.0000 mg | ORAL_TABLET | Freq: Once | ORAL | Status: AC
  Administered 2018-06-14: 650 mg via ORAL

## 2018-06-14 MED ORDER — HEPARIN SOD (PORK) LOCK FLUSH 100 UNIT/ML IV SOLN
500.0000 [IU] | Freq: Once | INTRAVENOUS | Status: AC | PRN
  Administered 2018-06-14: 500 [IU]
  Filled 2018-06-14: qty 5

## 2018-06-14 MED ORDER — SODIUM CHLORIDE 0.9 % IV SOLN
Freq: Once | INTRAVENOUS | Status: AC
  Administered 2018-06-14: 14:00:00 via INTRAVENOUS
  Filled 2018-06-14: qty 250

## 2018-06-14 MED ORDER — DIPHENHYDRAMINE HCL 25 MG PO CAPS
50.0000 mg | ORAL_CAPSULE | Freq: Once | ORAL | Status: AC
  Administered 2018-06-14: 50 mg via ORAL

## 2018-06-14 MED ORDER — METHYLPREDNISOLONE SODIUM SUCC 125 MG IJ SOLR
60.0000 mg | Freq: Once | INTRAMUSCULAR | Status: AC
  Administered 2018-06-14: 60 mg via INTRAVENOUS

## 2018-06-14 MED ORDER — METHYLPREDNISOLONE SODIUM SUCC 125 MG IJ SOLR
INTRAMUSCULAR | Status: AC
  Filled 2018-06-14: qty 2

## 2018-06-14 MED ORDER — ACETAMINOPHEN 325 MG PO TABS
ORAL_TABLET | ORAL | Status: AC
  Filled 2018-06-14: qty 2
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
## 2018-06-14 NOTE — Assessment & Plan Note (Addendum)
05/13/17: Christella Scheuermann biopsy: Left breast biopsy 2 o'clock position 8 cm from nipple ultrasound-guided biopsy: Grade 2 IDC, ER 50%, PR 3%, HER-2 equivocal Ki-67 15% nipple area biopsy grade 2 IDC, ER 100% positive, PR negative, HER-2 equivocal, Ki-67 40%  06/11/17:Left Mastectomy: 2 foci of IDC 2.2 and 1 cm Grade 2, 1/2 LN Positive; Grade 2 IDC, ER 50%, PR 3%, HER-2 equivocal Ki-67 15% nipple area biopsy grade 2 IDC, ER 100% positive, PR negative, HER-2 equivocal, Ki-67 40% T2N1a Stage 1B I discussed with her that given the fact that she has lymph node positive disease, this is clinically high risk disease.  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, potential benefit of treatment at 5 years: 94.6% distant metastasis free interval for patients treated with chemotherapy  Recommendation: 1.Adjuvant chemotherapy with dose dense Adriamycin and Cytoxan x4 followed by Taxol weekly x6 (stopped for neuropathy)07/23/2017 to 11/10/2017 Herceptin adjuvant treatment started 12/29/2017 2.followed by radiation [place]:Completed 02/22/2018 3.Followed by adjuvant antiestrogen therapy with Anastrozole 69m daily x5 to 7 years ----------------------------------------------------------------------- Repeat assessment revealed HER-2 negative focus but also HER-2 positivefocus making her tumor heterogeneous.  Current treatment: Herceptinmaintenance therapy,anastrozole started 03/23/2018  Herceptin toxicities: Tolerating well.  Patient notes that she will work on her own lymphedema at home, and does not need any assistance will this.  She will continue colace for her constipation.    She notes that she has stopped Anastrozole under the guidance of Dr. GLindi Adieand plans to restart later under his guidance.   She will continue Valtrex for her genital herpes.   Last Echo was in 03/28/2018 and showed EF of 60-65%.  I ordered repeat echo today.    She will return every 3 weeks for Herceptin and will see Dr.  GLindi Adiein 6 weeks.

## 2018-06-14 NOTE — Patient Instructions (Signed)
[facility] Discharge Instructions for Patients Receiving Chemotherapy  Today you received the following chemotherapy agents Herceptin  To help prevent nausea and vomiting after your treatment, we encourage you to take your nausea medication as directed   If you develop nausea and vomiting that is not controlled by your nausea medication, call the clinic.   BELOW ARE SYMPTOMS THAT SHOULD BE REPORTED IMMEDIATELY:  *FEVER GREATER THAN 100.5 F  *CHILLS WITH OR WITHOUT FEVER  NAUSEA AND VOMITING THAT IS NOT CONTROLLED WITH YOUR NAUSEA MEDICATION  *UNUSUAL SHORTNESS OF BREATH  *UNUSUAL BRUISING OR BLEEDING  TENDERNESS IN MOUTH AND THROAT WITH OR WITHOUT PRESENCE OF ULCERS  *URINARY PROBLEMS  *BOWEL PROBLEMS  UNUSUAL RASH Items with * indicate a potential emergency and should be followed up as soon as possible.  Feel free to call the clinic should you have any questions or concerns. The clinic phone number is [phone].  Please show the CHEMO ALERT CARD at check-in to the Emergency Department and triage nurse.   

## 2018-06-15 ENCOUNTER — Telehealth: Payer: Self-pay | Admitting: Hematology and Oncology

## 2018-06-15 NOTE — Telephone Encounter (Signed)
Tried to call °

## 2018-06-29 ENCOUNTER — Other Ambulatory Visit (HOSPITAL_COMMUNITY): Payer: Medicare Other

## 2018-06-29 IMAGING — CR DG CERVICAL SPINE 2 OR 3 VIEWS
2 series · 2 of 2 positions shown · non-contrast
Comparison: Cervical spine CT dated 08/19/2016.

CLINICAL DATA: Posterior cervical decompression and fusion at the
C3-4, C4-5, C5-6 and C6-7 levels.

EXAM:
CERVICAL SPINE - 2-3 VIEW

[AP (1 of 2)]
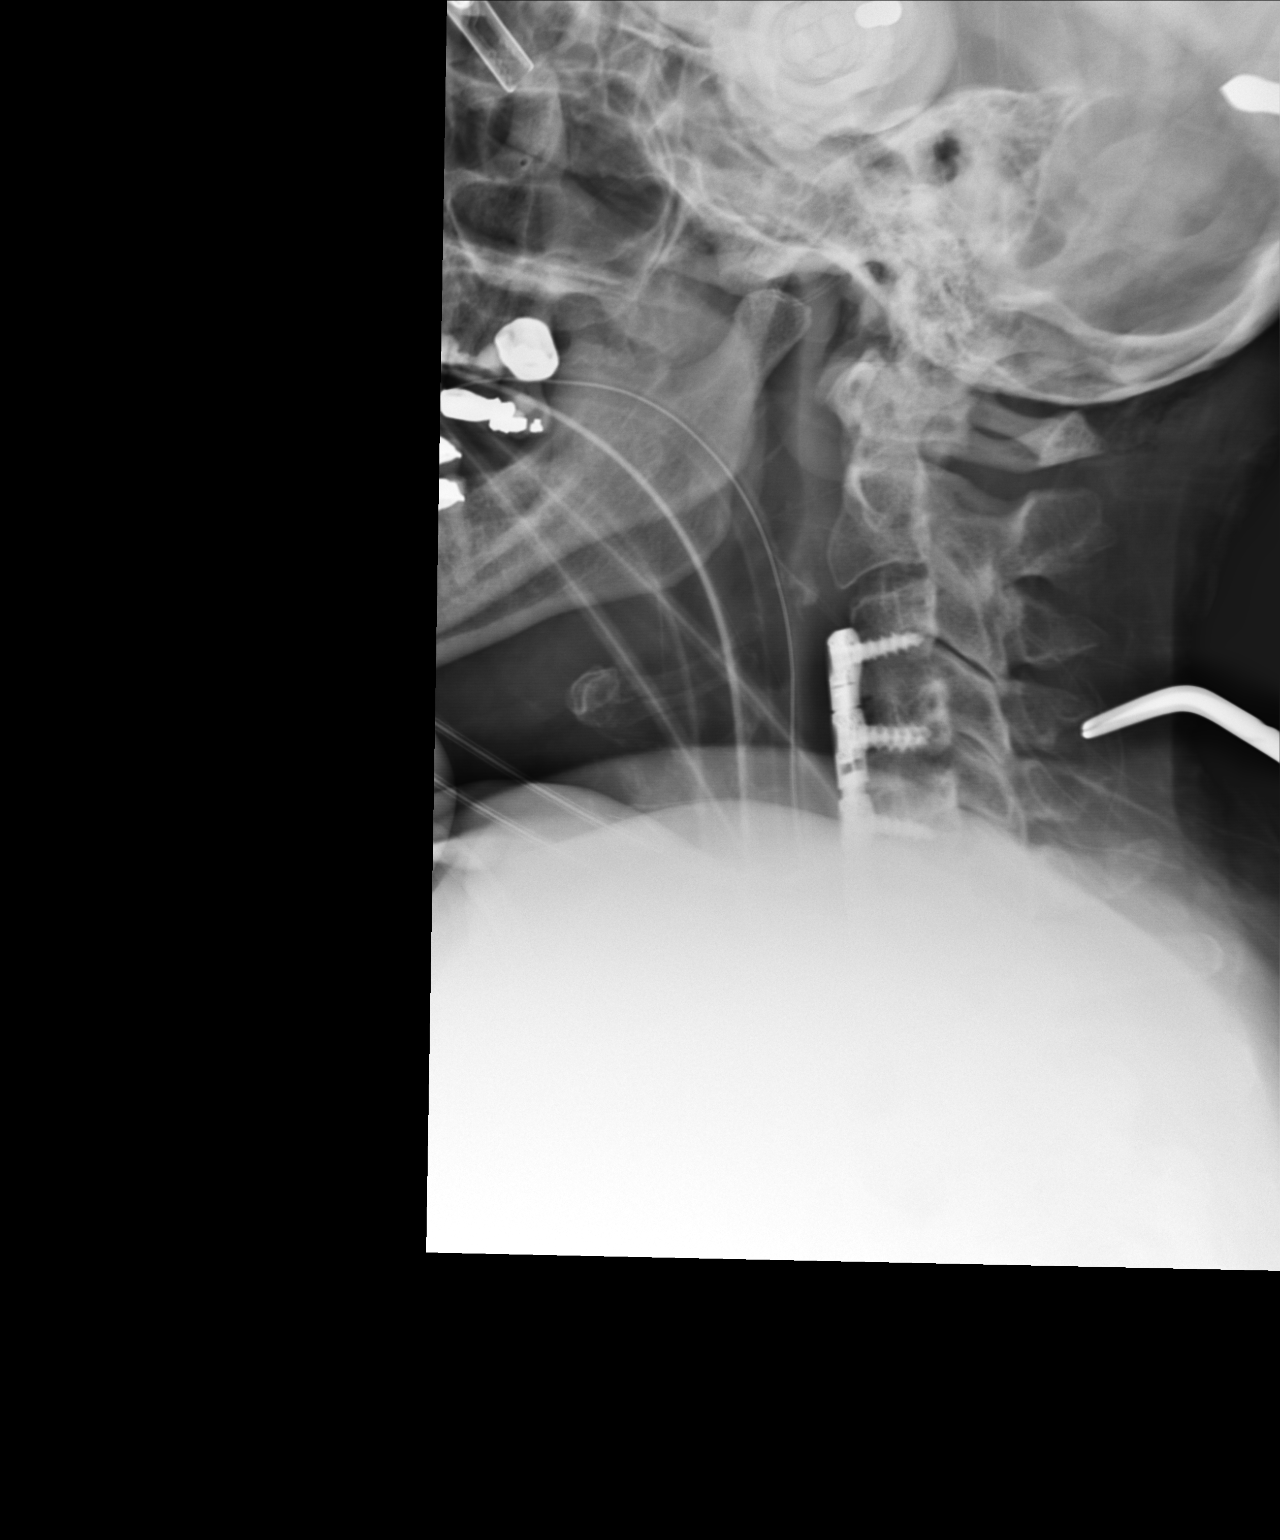

[AP (2 of 2)]
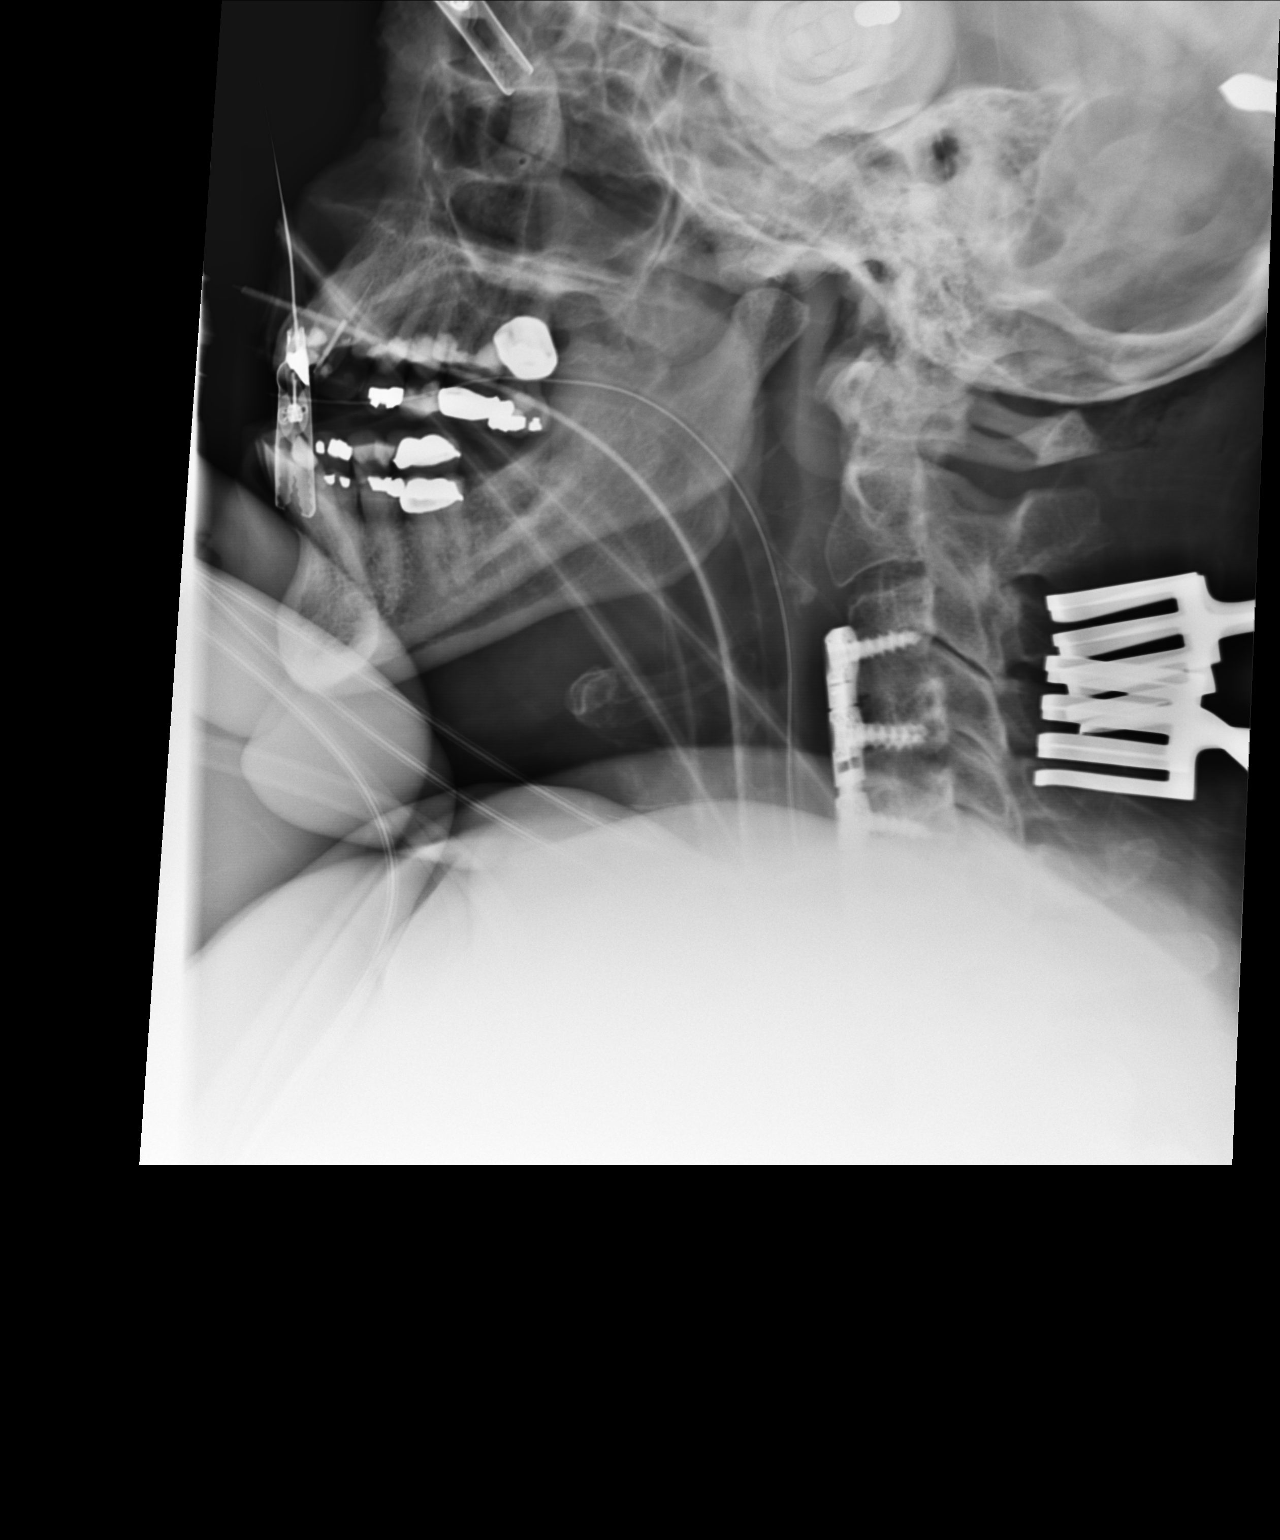

[2 of 2 positions shown; findings below may reference images not displayed]

FINDINGS: An initial portable cross-table lateral view of the cervical spine
demonstrates metallic tissue separate nurse posteriorly at the C3
through C5 levels. Interbody bone plug and anterior screw and plate
fusion is again demonstrated beginning at the C3 level and extending
inferiorly. The lower levels are obscured by the patient's
shoulders.

A second image demonstrates a metallic localizer with its tip
overlying the C4 spinous process.
IMPRESSION: Localizer at the C4 level.

## 2018-07-04 ENCOUNTER — Other Ambulatory Visit (HOSPITAL_COMMUNITY): Payer: Medicare Other

## 2018-07-05 ENCOUNTER — Ambulatory Visit (HOSPITAL_COMMUNITY)
Admission: RE | Admit: 2018-07-05 | Discharge: 2018-07-05 | Disposition: A | Discharge status: Home / Self Care | Payer: Medicare Other | Source: Ambulatory Visit | Attending: Adult Health | Admitting: Adult Health

## 2018-07-05 ENCOUNTER — Other Ambulatory Visit: Payer: Self-pay

## 2018-07-05 DIAGNOSIS — C50412 Malignant neoplasm of upper-outer quadrant of left female breast: Secondary | ICD-10-CM | POA: Insufficient documentation

## 2018-07-05 DIAGNOSIS — Z9221 Personal history of antineoplastic chemotherapy: Secondary | ICD-10-CM

## 2018-07-05 DIAGNOSIS — Z170 Estrogen receptor positive status [ER+]: Secondary | ICD-10-CM | POA: Insufficient documentation

## 2018-07-05 NOTE — Progress Notes (Signed)
  Echocardiogram 2D Echocardiogram has been performed.  Darlina Sicilian M 07/05/2018, 11:30 AM

## 2018-07-06 ENCOUNTER — Inpatient Hospital Stay: Payer: Medicare Other

## 2018-07-06 ENCOUNTER — Encounter: Payer: Self-pay | Admitting: *Deleted

## 2018-07-06 ENCOUNTER — Other Ambulatory Visit: Payer: Self-pay | Admitting: *Deleted

## 2018-07-06 ENCOUNTER — Other Ambulatory Visit: Payer: Self-pay

## 2018-07-06 ENCOUNTER — Other Ambulatory Visit: Payer: Medicare Other

## 2018-07-06 VITALS — BP 126/58 | HR 58 | Temp 97.8°F | Resp 18

## 2018-07-06 DIAGNOSIS — Z5112 Encounter for antineoplastic immunotherapy: Secondary | ICD-10-CM | POA: Diagnosis not present

## 2018-07-06 DIAGNOSIS — C50412 Malignant neoplasm of upper-outer quadrant of left female breast: Secondary | ICD-10-CM

## 2018-07-06 DIAGNOSIS — Z170 Estrogen receptor positive status [ER+]: Principal | ICD-10-CM

## 2018-07-06 MED ORDER — HEPARIN SOD (PORK) LOCK FLUSH 100 UNIT/ML IV SOLN
500.0000 [IU] | Freq: Once | INTRAVENOUS | Status: AC | PRN
  Administered 2018-07-06: 500 [IU]
  Filled 2018-07-06: qty 5

## 2018-07-06 MED ORDER — ACETAMINOPHEN 325 MG PO TABS
ORAL_TABLET | ORAL | Status: AC
  Filled 2018-07-06: qty 2

## 2018-07-06 MED ORDER — TRASTUZUMAB CHEMO 150 MG IV SOLR
600.0000 mg | Freq: Once | INTRAVENOUS | Status: AC
  Administered 2018-07-06: 600 mg via INTRAVENOUS
  Filled 2018-07-06: qty 28.57

## 2018-07-06 MED ORDER — SODIUM CHLORIDE 0.9% FLUSH
10.0000 mL | INTRAVENOUS | Status: DC | PRN
  Administered 2018-07-06: 10 mL
  Filled 2018-07-06: qty 10

## 2018-07-06 MED ORDER — DIPHENHYDRAMINE HCL 25 MG PO CAPS
50.0000 mg | ORAL_CAPSULE | Freq: Once | ORAL | Status: AC
  Administered 2018-07-06: 50 mg via ORAL

## 2018-07-06 MED ORDER — FAMOTIDINE IN NACL 20-0.9 MG/50ML-% IV SOLN
20.0000 mg | Freq: Once | INTRAVENOUS | Status: AC
  Administered 2018-07-06: 20 mg via INTRAVENOUS

## 2018-07-06 MED ORDER — FAMOTIDINE IN NACL 20-0.9 MG/50ML-% IV SOLN
INTRAVENOUS | Status: AC
  Filled 2018-07-06: qty 50

## 2018-07-06 MED ORDER — DIPHENHYDRAMINE HCL 25 MG PO CAPS
ORAL_CAPSULE | ORAL | Status: AC
  Filled 2018-07-06: qty 2

## 2018-07-06 MED ORDER — LETROZOLE 2.5 MG PO TABS: 2.5000 mg | ORAL_TABLET | Freq: Every day | ORAL | 0 refills | Status: DC

## 2018-07-06 MED ORDER — METHYLPREDNISOLONE SODIUM SUCC 125 MG IJ SOLR
INTRAMUSCULAR | Status: AC
  Filled 2018-07-06: qty 2

## 2018-07-06 MED ORDER — METHYLPREDNISOLONE SODIUM SUCC 125 MG IJ SOLR
60.0000 mg | Freq: Once | INTRAMUSCULAR | Status: AC
  Administered 2018-07-06: 60 mg via INTRAVENOUS

## 2018-07-06 MED ORDER — SODIUM CHLORIDE 0.9 % IV SOLN
Freq: Once | INTRAVENOUS | Status: AC
  Administered 2018-07-06: 15:00:00 via INTRAVENOUS
  Filled 2018-07-06: qty 250

## 2018-07-06 MED ORDER — ACETAMINOPHEN 325 MG PO TABS
650.0000 mg | ORAL_TABLET | Freq: Once | ORAL | Status: AC
  Administered 2018-07-06: 650 mg via ORAL

## 2018-07-06 NOTE — Progress Notes (Signed)
Pt called stating she had stopped taking Anastrozole for 2 weeks because she was experiencing severe itching.  Pt states that itching has stopped and believes it was related to the Anastrozole.  Script for Letrozole sent to pt's pharmacy and educated pt to take every other night for one week and then increase to every night.  Also educated pt to call if she has any itching, bone/joint pain, or side effects from the Letrozole.  Pt verbalized understanding.

## 2018-07-06 NOTE — Patient Instructions (Signed)
[facility] Discharge Instructions for Patients Receiving Chemotherapy  Today you received the following chemotherapy agents Herceptin  To help prevent nausea and vomiting after your treatment, we encourage you to take your nausea medication as directed   If you develop nausea and vomiting that is not controlled by your nausea medication, call the clinic.   BELOW ARE SYMPTOMS THAT SHOULD BE REPORTED IMMEDIATELY:  *FEVER GREATER THAN 100.5 F  *CHILLS WITH OR WITHOUT FEVER  NAUSEA AND VOMITING THAT IS NOT CONTROLLED WITH YOUR NAUSEA MEDICATION  *UNUSUAL SHORTNESS OF BREATH  *UNUSUAL BRUISING OR BLEEDING  TENDERNESS IN MOUTH AND THROAT WITH OR WITHOUT PRESENCE OF ULCERS  *URINARY PROBLEMS  *BOWEL PROBLEMS  UNUSUAL RASH Items with * indicate a potential emergency and should be followed up as soon as possible.  Feel free to call the clinic should you have any questions or concerns. The clinic phone number is [phone].  Please show the CHEMO ALERT CARD at check-in to the Emergency Department and triage nurse.   

## 2018-07-13 ENCOUNTER — Other Ambulatory Visit: Payer: Self-pay | Admitting: Hematology and Oncology

## 2018-07-21 NOTE — Assessment & Plan Note (Signed)
05/13/17: Christella Scheuermann biopsy: Left breast biopsy 2 o'clock position 8 cm from nipple ultrasound-guided biopsy: Grade 2 IDC, ER 50%, PR 3%, HER-2 equivocal Ki-67 15% nipple area biopsy grade 2 IDC, ER 100% positive, PR negative, HER-2 equivocal, Ki-67 40%  06/11/17:Left Mastectomy: 2 foci of IDC 2.2 and 1 cm Grade 2, 1/2 LN Positive; Grade 2 IDC, ER 50%, PR 3%, HER-2 equivocal Ki-67 15% nipple area biopsy grade 2 IDC, ER 100% positive, PR negative, HER-2 equivocal, Ki-67 40% T2N1a Stage 1B I discussed with her that given the fact that she has lymph node positive disease, this is clinically high risk disease.  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, potential benefit of treatment at 5 years: 94.6% distant metastasis free interval for patients treated with chemotherapy  Treatment summary: 1.Adjuvant chemotherapy with dose dense Adriamycin and Cytoxan x4 followed by Taxol weekly x6 (stopped for neuropathy)07/23/2017 to 11/10/2017 Herceptin adjuvant treatment started 12/29/2017 2.followed by radiation [place]:Completed 02/22/2018 3.Followed by adjuvant antiestrogen therapy with Anastrozole 12m daily x5 to 7 years ----------------------------------------------------------------------- Repeat assessment revealed HER-2 negative focus but also HER-2 positivefocus making her tumor heterogeneous.  Current treatment: Herceptinmaintenance therapy,anastrozole started 03/23/2018 Today's last dose of Herceptin.  Herceptin toxicities: Tolerating well.  We will plan on reinitiating anastrozole therapy.  Return to clinic in 6 months for follow-up.

## 2018-07-26 NOTE — Progress Notes (Incomplete)
° °Patient Care Team: °Tripathi, Kavita, MD as PCP - General (Family Medicine) ° °DIAGNOSIS:  °  ICD-10-CM   °1. 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   ° Z17.0   ° ° °SUMMARY OF ONCOLOGIC HISTORY: °  °Malignant neoplasm of upper-outer quadrant of left breast in female, estrogen receptor positive (HCC)  ° 05/13/2017 Initial Diagnosis  °  [name] biopsy: Left breast biopsy 2 o'clock position 8 cm from nipple ultrasound-guided biopsy: Grade 2 IDC, ER 50%, PR 3%, HER-2 equivocal Ki-67 15% nipple area biopsy grade 2 IDC, ER 100% positive, PR negative, HER-2 equivocal, Ki-67 40% °  ° 05/31/2017 Breast MRI  °  Left breast UOQ 230 position: 2 x 1.9 x 1.9 cm mass, second mass left breast UOQ 230 position: 1 x 1 x 0.7 cm, 2 more enhancing adjacent 3 and 5 mm nodules slightly superiorly midway between the 2 masses.  No abnormal lymph nodes, right breast normal °  ° 06/11/2017 Surgery  °  Left Mastectomy: 2 foci of IDC 2.2 and 1 cm Grade 2, 1/2 LN Positive; Grade 2 IDC, ER 50%, PR 3%, HER-2 equivocal Ki-67 15% nipple area biopsy grade 2 IDC, ER 100% positive, PR negative, HER-2 equivocal, Ki-67 40% °T2N1a Stage 1B °  ° 06/25/2017 Miscellaneous  °  Mammaprint high risk luminal type B °  ° 07/23/2017 - 11/10/2017 Chemotherapy  °  Adjuvant chemotherapy with dose dense Adriamycin and Cytoxan x4 followed by Taxol weekly x6 °  ° 12/10/2017 Pathology Results  °  Repeat analysis of pathology confirmed that patient has heterogeneous HER-2 signaling.  There is equivocal IHC 2+ and fish negative areas where the ratio was 1.49 and copy number of 2.9.  There is one area that is HER-2 +3+ by IHC °  ° 12/28/2017 - 02/22/2018 Radiation Therapy  °  Adj XRT at [facility] °  ° 12/29/2017 -  Chemotherapy  °  The patient had trastuzumab (HERCEPTIN) 798 mg in sodium chloride 0.9 % 250 mL chemo infusion, 8 mg/kg = 798 mg, Intravenous,  Once, 9 of 18 cycles °Administration: 798 mg  (12/29/2017), 588 mg (01/19/2018), 600 mg (03/23/2018), 600 mg (05/04/2018), 600 mg (05/26/2018), 600 mg (06/14/2018), 600 mg (07/06/2018), 600 mg (02/09/2018), 600 mg (03/03/2018) ° for chemotherapy treatment.  °  ° 03/23/2018 -  Anti-estrogen oral therapy  °  Anastrozole, 1mg daily stopped due to itching; switched to letrozole 06/2018 °  ° ° °CHIEF COMPLIANT: Follow-up of anastrozole and Herceptin ° °INTERVAL HISTORY: [facility] [name] is a 73 y.o. with above-mentioned history of HER-2 positive breast cancer who is currently on Herceptin maintenance and anti-estrogen therapy with letrozole, after anastrozole caused severe itching. ECHO on 07/05/18 showed an ejection fraction in the range of 60-65%. She presents to the clinic alone today for treatment.  ° °REVIEW OF SYSTEMS:   °Constitutional: Denies fevers, chills or abnormal weight loss °Eyes: Denies blurriness of vision °Ears, nose, mouth, throat, and face: Denies mucositis or sore throat °Respiratory: Denies cough, dyspnea or wheezes °Cardiovascular: Denies palpitation, chest discomfort °Gastrointestinal: Denies nausea, heartburn or change in bowel habits °Skin: Denies abnormal skin rashes °Lymphatics: Denies new lymphadenopathy or easy bruising °Neurological: Denies numbness, tingling or new weaknesses °Behavioral/Psych: Mood is stable, no new changes  °Extremities: No lower extremity edema °Breast: denies any pain or lumps or nodules in either breasts °All other systems were reviewed with the patient and are negative. ° °I have reviewed the past medical   medical history, past surgical history, social history and family history with the patient and they are unchanged from previous note.  ALLERGIES:  is allergic to atorvastatin; vancomycin; ancef [cefazolin]; lactose; nasonex [mometasone furoate]; and neurontin [gabapentin].  MEDICATIONS:  Current Outpatient Medications  Medication Sig Dispense Refill   acetaminophen (TYLENOL) 500 MG tablet Take 1,000 mg by mouth 3 (three)  times daily as needed for headache (pain).      amLODipine (NORVASC) 5 MG tablet Take 5 mg by mouth daily.      anastrozole (ARIMIDEX) 1 MG tablet Take 1 tablet (1 mg total) by mouth daily. 90 tablet 3   atorvastatin (LIPITOR) 20 MG tablet Take 20 mg by mouth at bedtime.     B Complex-C (SUPER B COMPLEX PO) Take 1 tablet by mouth daily.     bumetanide (BUMEX) 1 MG tablet Take 1 tablet by mouth once daily 30 tablet 0   Calcium Carb-Cholecalciferol (CALCIUM 600-D PO) Take 1 tablet by mouth at bedtime.     carvedilol (COREG) 25 MG tablet Take 1 tablet (25 mg total) by mouth 2 (two) times daily with a meal. 60 tablet 0   cetirizine (ZYRTEC) 10 MG tablet Take 10 mg by mouth at bedtime.      clopidogrel (PLAVIX) 75 MG tablet Take 1 tablet (75 mg total) by mouth every evening. (Patient taking differently: Take 75 mg by mouth daily. ) 30 tablet 0   diclofenac sodium (VOLTAREN) 1 % GEL Apply 4 g topically at bedtime. 100 g 6   diphenoxylate-atropine (LOMOTIL) 2.5-0.025 MG tablet Take 1 tablet by mouth 4 (four) times daily as needed for diarrhea or loose stools. 30 tablet 0   doxycycline (VIBRAMYCIN) 100 MG capsule Take 1 capsule (100 mg total) by mouth 2 (two) times daily. One po bid x 7 days 14 capsule 0   ferrous sulfate 325 (65 FE) MG tablet Take 325 mg by mouth daily with breakfast.     fexofenadine (ALLEGRA) 180 MG tablet Take 180 mg by mouth daily.     fluticasone (FLONASE) 50 MCG/ACT nasal spray Place 1 spray into both nostrils daily as needed (seasonal allergies).      ipratropium (ATROVENT) 0.03 % nasal spray Place 2 sprays into both nostrils every 8 (eight) hours.     lansoprazole (PREVACID) 15 MG capsule Take 15 mg by mouth daily.      letrozole (FEMARA) 2.5 MG tablet Take 1 tablet (2.5 mg total) by mouth daily. 30 tablet 0   lidocaine-prilocaine (EMLA) cream Apply 1 application topically as needed. 30 g 0   lisinopril (PRINIVIL,ZESTRIL) 40 MG tablet Take 40 mg by mouth  daily.     ondansetron (ZOFRAN ODT) 4 MG disintegrating tablet Take 1 tablet (4 mg total) by mouth every 8 (eight) hours as needed for nausea. 15 tablet 0   ondansetron (ZOFRAN) 8 MG tablet Take 1 tablet (8 mg total) by mouth every 8 (eight) hours as needed for nausea or vomiting. 20 tablet 0   PARoxetine (PAXIL) 10 MG tablet Take 10 mg by mouth at bedtime.      Polyethyl Glycol-Propyl Glycol (SYSTANE OP) Place 1 drop into both eyes daily as needed (for dry eyes).      potassium chloride (K-DUR,KLOR-CON) 10 MEQ tablet Take 10 mEq by mouth at bedtime.      pregabalin (LYRICA) 50 MG capsule Take 1 capsule (50 mg total) by mouth 2 (two) times daily. 60 capsule 3   sulfamethoxazole-trimethoprim (BACTRIM DS,SEPTRA DS) 800-160 MG  Take 1 tablet by mouth 2 (two) times daily.    °• traMADol (ULTRAM) 50 MG tablet Take 1 tablet (50 mg total) by mouth 2 (two) times daily as needed for moderate pain (mild pain). 20 tablet 0  ° °No current facility-administered medications for this visit.   ° ° °PHYSICAL EXAMINATION: °ECOG PERFORMANCE STATUS: {CHL ONC ECOG [id number]} ° °There were no vitals filed for this visit. °There were no vitals filed for this visit. ° °GENERAL: alert, no distress and comfortable °SKIN: skin color, texture, turgor are normal, no rashes or significant lesions °EYES: normal, Conjunctiva are pink and non-injected, sclera clear °OROPHARYNX: no exudate, no erythema and lips, buccal mucosa, and tongue normal  °NECK: supple, thyroid normal size, non-tender, without nodularity °LYMPH: no palpable lymphadenopathy in the cervical, axillary or inguinal °LUNGS: clear to auscultation and percussion with normal breathing effort °HEART: regular rate & rhythm and no murmurs and no lower extremity edema °ABDOMEN: abdomen soft, non-tender and normal bowel sounds °MUSCULOSKELETAL: no cyanosis of digits and no clubbing  °NEURO: alert & oriented x 3 with fluent speech, no focal motor/sensory  deficits °EXTREMITIES: No lower extremity edema ° °LABORATORY DATA:  °I have reviewed the data as listed °CMP Latest Ref Rng & Units 06/14/2018 05/04/2018 04/13/2018  °Glucose 70 - 99 mg/dL 100(H) 131(H) 110(H)  °BUN 8 - 23 mg/dL 15 14 7(L)  °Creatinine 0.44 - 1.00 mg/dL 0.78 0.71 0.67  °Sodium 135 - 145 mmol/L 143 141 141  °Potassium 3.5 - 5.1 mmol/L 3.8 3.7 4.3  °Chloride 98 - 111 mmol/L 102 103 104  °CO2 22 - 32 mmol/L 34(H) 31 31  °Calcium 8.9 - 10.3 mg/dL 9.2 8.8(L) 9.1  °Total Protein 6.5 - 8.1 g/dL 6.5 6.6 6.5  °Total Bilirubin 0.3 - 1.2 mg/dL 0.4 0.3 0.4  °Alkaline Phos 38 - 126 U/L 83 93 68  °AST 15 - 41 U/L 29 37 53(H)  °ALT 0 - 44 U/L 26 33 38  ° ° °Lab Results  °Component Value Date  ° WBC 3.2 (L) 06/14/2018  ° HGB 11.8 (L) 06/14/2018  ° HCT 35.9 (L) 06/14/2018  ° MCV 98.9 06/14/2018  ° PLT 154 06/14/2018  ° NEUTROABS 1.9 06/14/2018  ° ° °ASSESSMENT & PLAN:  °No problem-specific Assessment & Plan notes found for this encounter. ° ° ° °No orders of the defined types were placed in this encounter. ° °The patient has a good understanding of the overall plan. she agrees with it. she will call with any problems that may develop before the next visit here. ° °Gudena, Vinay, MD °07/26/2018 ° °I, Molly Dorshimer am acting as scribe for Dr. Vinay Gudena. ° °{Add scribe attestation statement} ° ° ° ° ° °

## 2018-07-27 ENCOUNTER — Inpatient Hospital Stay: Payer: Medicare Other | Admitting: Hematology and Oncology

## 2018-07-27 ENCOUNTER — Other Ambulatory Visit: Payer: Self-pay

## 2018-07-27 ENCOUNTER — Inpatient Hospital Stay: Payer: Medicare Other | Attending: Hematology and Oncology

## 2018-07-27 ENCOUNTER — Encounter: Payer: Self-pay | Admitting: *Deleted

## 2018-07-27 ENCOUNTER — Inpatient Hospital Stay: Payer: Medicare Other

## 2018-07-27 DIAGNOSIS — Z5112 Encounter for antineoplastic immunotherapy: Secondary | ICD-10-CM | POA: Diagnosis not present

## 2018-07-27 DIAGNOSIS — C50412 Malignant neoplasm of upper-outer quadrant of left female breast: Secondary | ICD-10-CM

## 2018-07-27 DIAGNOSIS — Z79899 Other long term (current) drug therapy: Secondary | ICD-10-CM | POA: Insufficient documentation

## 2018-07-27 DIAGNOSIS — Z170 Estrogen receptor positive status [ER+]: Secondary | ICD-10-CM

## 2018-07-27 DIAGNOSIS — Z95828 Presence of other vascular implants and grafts: Secondary | ICD-10-CM

## 2018-07-27 DIAGNOSIS — D839 Common variable immunodeficiency, unspecified: Secondary | ICD-10-CM | POA: Insufficient documentation

## 2018-07-27 LAB — CBC WITH DIFFERENTIAL (CANCER CENTER ONLY)
Abs Immature Granulocytes: 0 10*3/uL (ref 0.00–0.07)
Basophils Absolute: 0 10*3/uL (ref 0.0–0.1)
Basophils Relative: 1 %
Eosinophils Absolute: 0.1 10*3/uL (ref 0.0–0.5)
Eosinophils Relative: 2 %
HCT: 36.2 % (ref 36.0–46.0)
Hemoglobin: 12.1 g/dL (ref 12.0–15.0)
Immature Granulocytes: 0 %
Lymphocytes Relative: 29 %
Lymphs Abs: 0.9 10*3/uL (ref 0.7–4.0)
MCH: 32.3 pg (ref 26.0–34.0)
MCHC: 33.4 g/dL (ref 30.0–36.0)
MCV: 96.5 fL (ref 80.0–100.0)
Monocytes Absolute: 0.3 10*3/uL (ref 0.1–1.0)
Monocytes Relative: 10 %
Neutro Abs: 1.8 10*3/uL (ref 1.7–7.7)
Neutrophils Relative %: 58 %
Platelet Count: 146 10*3/uL — ABNORMAL LOW (ref 150–400)
RBC: 3.75 MIL/uL — ABNORMAL LOW (ref 3.87–5.11)
RDW: 12.6 % (ref 11.5–15.5)
WBC Count: 3 10*3/uL — ABNORMAL LOW (ref 4.0–10.5)
nRBC: 0 % (ref 0.0–0.2)

## 2018-07-27 LAB — CMP (CANCER CENTER ONLY)
ALT: 31 U/L (ref 0–44)
AST: 31 U/L (ref 15–41)
Albumin: 3.5 g/dL (ref 3.5–5.0)
Alkaline Phosphatase: 86 U/L (ref 38–126)
Anion gap: 9 (ref 5–15)
BUN: 9 mg/dL (ref 8–23)
CO2: 28 mmol/L (ref 22–32)
Calcium: 9 mg/dL (ref 8.9–10.3)
Chloride: 104 mmol/L (ref 98–111)
Creatinine: 0.79 mg/dL (ref 0.44–1.00)
GFR, Est AFR Am: >60 mL/min (ref >60)
GFR, Estimated: >60 mL/min (ref >60)
Glucose, Bld: 169 mg/dL — ABNORMAL HIGH (ref 70–99)
Potassium: 3.8 mmol/L (ref 3.5–5.1)
Sodium: 141 mmol/L (ref 135–145)
Total Bilirubin: 0.4 mg/dL (ref 0.3–1.2)
Total Protein: 6.3 g/dL — ABNORMAL LOW (ref 6.5–8.1)

## 2018-07-27 MED ORDER — DIPHENHYDRAMINE HCL 25 MG PO CAPS
ORAL_CAPSULE | ORAL | Status: AC
  Filled 2018-07-27: qty 1

## 2018-07-27 MED ORDER — DIPHENHYDRAMINE HCL 25 MG PO CAPS
25.0000 mg | ORAL_CAPSULE | Freq: Once | ORAL | Status: AC
  Administered 2018-07-27: 15:00:00 25 mg via ORAL

## 2018-07-27 MED ORDER — ACETAMINOPHEN 325 MG PO TABS
650.0000 mg | ORAL_TABLET | Freq: Once | ORAL | Status: AC
  Administered 2018-07-27: 15:00:00 650 mg via ORAL

## 2018-07-27 MED ORDER — ACETAMINOPHEN 325 MG PO TABS
ORAL_TABLET | ORAL | Status: AC
  Filled 2018-07-27: qty 2

## 2018-07-27 MED ORDER — METHYLPREDNISOLONE SODIUM SUCC 125 MG IJ SOLR
60.0000 mg | Freq: Once | INTRAMUSCULAR | Status: AC
  Administered 2018-07-27: 15:00:00 60 mg via INTRAVENOUS

## 2018-07-27 MED ORDER — TRASTUZUMAB CHEMO 150 MG IV SOLR
600.0000 mg | Freq: Once | INTRAVENOUS | Status: AC
  Administered 2018-07-27: 600 mg via INTRAVENOUS
  Filled 2018-07-27: qty 28.57

## 2018-07-27 MED ORDER — SODIUM CHLORIDE 0.9% FLUSH
10.0000 mL | INTRAVENOUS | Status: DC | PRN
  Administered 2018-07-27: 17:00:00 10 mL
  Filled 2018-07-27: qty 10

## 2018-07-27 MED ORDER — SODIUM CHLORIDE 0.9 % IV SOLN
Freq: Once | INTRAVENOUS | Status: AC
  Administered 2018-07-27: 15:00:00 via INTRAVENOUS
  Filled 2018-07-27: qty 250

## 2018-07-27 MED ORDER — FAMOTIDINE IN NACL 20-0.9 MG/50ML-% IV SOLN
INTRAVENOUS | Status: AC
  Filled 2018-07-27: qty 50

## 2018-07-27 MED ORDER — METHYLPREDNISOLONE SODIUM SUCC 125 MG IJ SOLR
INTRAMUSCULAR | Status: AC
  Filled 2018-07-27: qty 2

## 2018-07-27 MED ORDER — MICONAZOLE NITRATE 2 % EX CREA: 1.0000 "application " | TOPICAL_CREAM | Freq: Two times a day (BID) | CUTANEOUS | 3 refills | Status: DC

## 2018-07-27 MED ORDER — FAMOTIDINE IN NACL 20-0.9 MG/50ML-% IV SOLN
20.0000 mg | Freq: Once | INTRAVENOUS | Status: AC
  Administered 2018-07-27: 15:00:00 20 mg via INTRAVENOUS

## 2018-07-27 MED ORDER — HEPARIN SOD (PORK) LOCK FLUSH 100 UNIT/ML IV SOLN
500.0000 [IU] | Freq: Once | INTRAVENOUS | Status: AC | PRN
  Administered 2018-07-27: 500 [IU]
  Filled 2018-07-27: qty 5

## 2018-07-27 MED ORDER — SODIUM CHLORIDE 0.9% FLUSH
10.0000 mL | INTRAVENOUS | Status: DC | PRN
  Administered 2018-07-27: 10 mL
  Filled 2018-07-27: qty 10

## 2018-07-27 NOTE — Patient Instructions (Signed)
[facility] Discharge Instructions for Patients Receiving Chemotherapy  Today you received the following chemotherapy agents Herceptin  To help prevent nausea and vomiting after your treatment, we encourage you to take your nausea medication as directed   If you develop nausea and vomiting that is not controlled by your nausea medication, call the clinic.   BELOW ARE SYMPTOMS THAT SHOULD BE REPORTED IMMEDIATELY:  *FEVER GREATER THAN 100.5 F  *CHILLS WITH OR WITHOUT FEVER  NAUSEA AND VOMITING THAT IS NOT CONTROLLED WITH YOUR NAUSEA MEDICATION  *UNUSUAL SHORTNESS OF BREATH  *UNUSUAL BRUISING OR BLEEDING  TENDERNESS IN MOUTH AND THROAT WITH OR WITHOUT PRESENCE OF ULCERS  *URINARY PROBLEMS  *BOWEL PROBLEMS  UNUSUAL RASH Items with * indicate a potential emergency and should be followed up as soon as possible.  Feel free to call the clinic should you have any questions or concerns. The clinic phone number is [phone].  Please show the CHEMO ALERT CARD at check-in to the Emergency Department and triage nurse.   

## 2018-08-03 ENCOUNTER — Other Ambulatory Visit: Payer: Self-pay | Admitting: Hematology and Oncology

## 2018-08-16 ENCOUNTER — Other Ambulatory Visit: Payer: Self-pay | Admitting: Hematology and Oncology

## 2018-08-16 ENCOUNTER — Encounter: Payer: Self-pay | Admitting: Adult Health

## 2018-08-17 ENCOUNTER — Inpatient Hospital Stay: Payer: Medicare Other | Attending: Hematology and Oncology

## 2018-08-17 ENCOUNTER — Other Ambulatory Visit: Payer: Self-pay

## 2018-08-17 VITALS — BP 118/50 | HR 60 | Temp 98.1°F | Resp 18

## 2018-08-17 DIAGNOSIS — C50412 Malignant neoplasm of upper-outer quadrant of left female breast: Secondary | ICD-10-CM | POA: Diagnosis present

## 2018-08-17 DIAGNOSIS — Z5112 Encounter for antineoplastic immunotherapy: Secondary | ICD-10-CM | POA: Diagnosis not present

## 2018-08-17 DIAGNOSIS — Z170 Estrogen receptor positive status [ER+]: Secondary | ICD-10-CM | POA: Insufficient documentation

## 2018-08-17 MED ORDER — FAMOTIDINE IN NACL 20-0.9 MG/50ML-% IV SOLN
INTRAVENOUS | Status: AC
  Filled 2018-08-17: qty 50

## 2018-08-17 MED ORDER — SODIUM CHLORIDE 0.9 % IV SOLN
Freq: Once | INTRAVENOUS | Status: AC
  Administered 2018-08-17: 15:00:00 via INTRAVENOUS
  Filled 2018-08-17: qty 250

## 2018-08-17 MED ORDER — HEPARIN SOD (PORK) LOCK FLUSH 100 UNIT/ML IV SOLN
500.0000 [IU] | Freq: Once | INTRAVENOUS | Status: AC | PRN
  Administered 2018-08-17: 500 [IU]
  Filled 2018-08-17: qty 5

## 2018-08-17 MED ORDER — METHYLPREDNISOLONE SODIUM SUCC 125 MG IJ SOLR
60.0000 mg | Freq: Once | INTRAMUSCULAR | Status: AC
  Administered 2018-08-17: 60 mg via INTRAVENOUS

## 2018-08-17 MED ORDER — METHYLPREDNISOLONE SODIUM SUCC 125 MG IJ SOLR
INTRAMUSCULAR | Status: AC
  Filled 2018-08-17: qty 2

## 2018-08-17 MED ORDER — ACETAMINOPHEN 325 MG PO TABS
ORAL_TABLET | ORAL | Status: AC
  Filled 2018-08-17: qty 2

## 2018-08-17 MED ORDER — TRASTUZUMAB CHEMO 150 MG IV SOLR
600.0000 mg | Freq: Once | INTRAVENOUS | Status: AC
  Administered 2018-08-17: 600 mg via INTRAVENOUS
  Filled 2018-08-17: qty 28.57

## 2018-08-17 MED ORDER — DIPHENHYDRAMINE HCL 25 MG PO CAPS
ORAL_CAPSULE | ORAL | Status: AC
  Filled 2018-08-17: qty 1

## 2018-08-17 MED ORDER — SODIUM CHLORIDE 0.9% FLUSH
10.0000 mL | INTRAVENOUS | Status: DC | PRN
  Administered 2018-08-17: 10 mL
  Filled 2018-08-17: qty 10

## 2018-08-17 MED ORDER — FAMOTIDINE IN NACL 20-0.9 MG/50ML-% IV SOLN
20.0000 mg | Freq: Once | INTRAVENOUS | Status: AC
  Administered 2018-08-17: 20 mg via INTRAVENOUS

## 2018-08-17 MED ORDER — ACETAMINOPHEN 325 MG PO TABS
650.0000 mg | ORAL_TABLET | Freq: Once | ORAL | Status: AC
  Administered 2018-08-17: 650 mg via ORAL

## 2018-08-17 MED ORDER — DIPHENHYDRAMINE HCL 25 MG PO CAPS
25.0000 mg | ORAL_CAPSULE | Freq: Once | ORAL | Status: AC
  Administered 2018-08-17: 25 mg via ORAL

## 2018-08-17 NOTE — Patient Instructions (Signed)
[facility] Discharge Instructions for Patients Receiving Chemotherapy  Today you received the following chemotherapy agents Herceptin  To help prevent nausea and vomiting after your treatment, we encourage you to take your nausea medication as directed   If you develop nausea and vomiting that is not controlled by your nausea medication, call the clinic.   BELOW ARE SYMPTOMS THAT SHOULD BE REPORTED IMMEDIATELY:  *FEVER GREATER THAN 100.5 F  *CHILLS WITH OR WITHOUT FEVER  NAUSEA AND VOMITING THAT IS NOT CONTROLLED WITH YOUR NAUSEA MEDICATION  *UNUSUAL SHORTNESS OF BREATH  *UNUSUAL BRUISING OR BLEEDING  TENDERNESS IN MOUTH AND THROAT WITH OR WITHOUT PRESENCE OF ULCERS  *URINARY PROBLEMS  *BOWEL PROBLEMS  UNUSUAL RASH Items with * indicate a potential emergency and should be followed up as soon as possible.  Feel free to call the clinic should you have any questions or concerns. The clinic phone number is [phone].  Please show the CHEMO ALERT CARD at check-in to the Emergency Department and triage nurse.   

## 2018-08-31 ENCOUNTER — Other Ambulatory Visit: Payer: Self-pay | Admitting: Hematology and Oncology

## 2018-09-01 NOTE — Assessment & Plan Note (Signed)
05/13/17: Christella Scheuermann biopsy: Left breast biopsy 2 o'clock position 8 cm from nipple ultrasound-guided biopsy: Grade 2 IDC, ER 50%, PR 3%, HER-2 equivocal Ki-67 15% nipple area biopsy grade 2 IDC, ER 100% positive, PR negative, HER-2 equivocal, Ki-67 40%  06/11/17:Left Mastectomy: 2 foci of IDC 2.2 and 1 cm Grade 2, 1/2 LN Positive; Grade 2 IDC, ER 50%, PR 3%, HER-2 equivocal Ki-67 15% nipple area biopsy grade 2 IDC, ER 100% positive, PR negative, HER-2 equivocal, Ki-67 40% T2N1a Stage 1B I discussed with her that given the fact that she has lymph node positive disease, this is clinically high risk disease.  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, potential benefit of treatment at 5 years: 94.6% distant metastasis free interval for patients treated with chemotherapy  Treatment summary: 1.Adjuvant chemotherapy with dose dense Adriamycin and Cytoxan x4 followed by Taxol weekly x6 (stopped for neuropathy)07/23/2017 to 11/10/2017 Herceptin adjuvant treatment started 12/29/2017 2.followed by radiation [place]:Completed 02/22/2018 3.Followed by adjuvant antiestrogen therapy with Anastrozole 2m daily x5 to 7 years ----------------------------------------------------------------------- Repeat assessment revealed HER-2 negative focus but also HER-2 positivefocus making her tumor heterogeneous.  Current treatment: Herceptinmaintenance therapy,anastrozole started 03/23/2018 Today's last dose of Herceptin.  Herceptin toxicities: Tolerating well.  We will plan on reinitiating anastrozole therapy.  Return to clinic in 6 months for follow-up.

## 2018-09-06 NOTE — Progress Notes (Signed)
Patient Care Team: [name], MD as PCP - General (Family Medicine)  DIAGNOSIS:    ICD-10-CM   1. Malignant neoplasm of upper-outer quadrant of left breast in female, estrogen receptor positive ([place])  C50.412    Z17.0     SUMMARY OF ONCOLOGIC HISTORY: Oncology History  Malignant neoplasm of upper-outer quadrant of left breast in female, estrogen receptor positive ([place])  05/13/2017 Initial Diagnosis   [place] biopsy: Left breast biopsy 2 o'clock position 8 cm from nipple ultrasound-guided biopsy: Grade 2 IDC, ER 50%, PR 3%, HER-2 equivocal Ki-67 15% nipple area biopsy grade 2 IDC, ER 100% positive, PR negative, HER-2 equivocal, Ki-67 40%   05/31/2017 Breast MRI   Left breast UOQ 230 position: 2 x 1.9 x 1.9 cm mass, second mass left breast UOQ 230 position: 1 x 1 x 0.7 cm, 2 more enhancing adjacent 3 and 5 mm nodules slightly superiorly midway between the 2 masses.  No abnormal lymph nodes, right breast normal   06/11/2017 Surgery   Left Mastectomy: 2 foci of IDC 2.2 and 1 cm Grade 2, 1/2 LN Positive; Grade 2 IDC, ER 50%, PR 3%, HER-2 equivocal Ki-67 15% nipple area biopsy grade 2 IDC, ER 100% positive, PR negative, HER-2 equivocal, Ki-67 40% T2N1a Stage 1B   06/25/2017 Miscellaneous   Mammaprint high risk luminal type B   07/23/2017 - 11/10/2017 Chemotherapy   Adjuvant chemotherapy with dose dense Adriamycin and Cytoxan x4 followed by Taxol weekly x6   12/10/2017 Pathology Results   Repeat analysis of pathology confirmed that patient has heterogeneous HER-2 signaling.  There is equivocal IHC 2+ and fish negative areas where the ratio was 1.49 and copy number of 2.9.  There is one area that is HER-2 +3+ by [facility]   12/28/2017 - 02/22/2018 Radiation Therapy   Adj XRT at [facility]   12/29/2017 -  Chemotherapy   The patient had trastuzumab (HERCEPTIN) 798 mg in sodium chloride 0.9 % 250 mL chemo infusion, 8 mg/kg = 798 mg, Intravenous,  Once, 11 of 18 cycles Administration: 798  mg (12/29/2017), 588 mg (01/19/2018), 600 mg (03/23/2018), 600 mg (05/04/2018), 600 mg (05/26/2018), 600 mg (06/14/2018), 600 mg (07/06/2018), 600 mg (07/27/2018), 600 mg (08/17/2018), 600 mg (02/09/2018), 600 mg (03/03/2018)  for chemotherapy treatment.    03/23/2018 -  Anti-estrogen oral therapy   Anastrozole, 26m daily stopped due to itching; switched to letrozole 06/2018     CHIEF COMPLIANT: Follow-up of anastrozole and Herceptin  INTERVAL HISTORY: JKIRSTEN SPEARINGis a 73y.o. with above-mentioned history of HER-2 positive breast cancer treated with left mastectomy, adjuvant chemotherapy, radiation, and who is currently on Herceptin maintenance with anastrozole. She presents to the clinic today for treatment.  REVIEW OF SYSTEMS:   Constitutional: Denies fevers, chills or abnormal weight loss Eyes: Denies blurriness of vision Ears, nose, mouth, throat, and face: Denies mucositis or sore throat Respiratory: Denies cough, dyspnea or wheezes Cardiovascular: Denies palpitation, chest discomfort Gastrointestinal: Denies nausea, heartburn or change in bowel habits Skin: Denies abnormal skin rashes Lymphatics: Denies new lymphadenopathy or easy bruising Neurological: Denies numbness, tingling or new weaknesses Behavioral/Psych: Mood is stable, no new changes  Extremities: No lower extremity edema Breast: denies any pain or lumps or nodules in either breasts All other systems were reviewed with the patient and are negative.  I have reviewed the past medical history, past surgical history, social history and family history with the patient and they are unchanged from previous note.  ALLERGIES:  is  allergic to atorvastatin; vancomycin; ancef [cefazolin]; lactose; nasonex [mometasone furoate]; and neurontin [gabapentin].  MEDICATIONS:  Current Outpatient Medications  Medication Sig Dispense Refill  . acetaminophen (TYLENOL) 500 MG tablet Take 1,000 mg by mouth 3 (three) times daily as needed for  headache (pain).     Marland Kitchen amLODipine (NORVASC) 5 MG tablet Take 5 mg by mouth daily.     Marland Kitchen anastrozole (ARIMIDEX) 1 MG tablet Take 1 tablet (1 mg total) by mouth daily. 90 tablet 3  . atorvastatin (LIPITOR) 20 MG tablet Take 20 mg by mouth at bedtime.    . B Complex-C (SUPER B COMPLEX PO) Take 1 tablet by mouth daily.    . bumetanide (BUMEX) 1 MG tablet Take 1 tablet by mouth once daily 30 tablet 0  . Calcium Carb-Cholecalciferol (CALCIUM 600-D PO) Take 1 tablet by mouth at bedtime.    . carvedilol (COREG) 25 MG tablet Take 1 tablet (25 mg total) by mouth 2 (two) times daily with a meal. 60 tablet 0  . cetirizine (ZYRTEC) 10 MG tablet Take 10 mg by mouth at bedtime.     . clopidogrel (PLAVIX) 75 MG tablet Take 1 tablet (75 mg total) by mouth every evening. (Patient taking differently: Take 75 mg by mouth daily. ) 30 tablet 0  . diclofenac sodium (VOLTAREN) 1 % GEL Apply 4 g topically at bedtime. 100 g 6  . diphenoxylate-atropine (LOMOTIL) 2.5-0.025 MG tablet Take 1 tablet by mouth 4 (four) times daily as needed for diarrhea or loose stools. 30 tablet 0  . doxycycline (VIBRAMYCIN) 100 MG capsule Take 1 capsule (100 mg total) by mouth 2 (two) times daily. One po bid x 7 days 14 capsule 0  . ferrous sulfate 325 (65 FE) MG tablet Take 325 mg by mouth daily with breakfast.    . fexofenadine (ALLEGRA) 180 MG tablet Take 180 mg by mouth daily.    . fluticasone (FLONASE) 50 MCG/ACT nasal spray Place 1 spray into both nostrils daily as needed (seasonal allergies).     Marland Kitchen ipratropium (ATROVENT) 0.03 % nasal spray Place 2 sprays into both nostrils every 8 (eight) hours.    . lansoprazole (PREVACID) 15 MG capsule Take 15 mg by mouth daily.     Marland Kitchen letrozole (FEMARA) 2.5 MG tablet Take 1 tablet by mouth once daily 90 tablet 0  . lidocaine-prilocaine (EMLA) cream Apply 1 application topically as needed. 30 g 0  . lisinopril (PRINIVIL,ZESTRIL) 40 MG tablet Take 40 mg by mouth daily.    . miconazole (MICATIN) 2 %  cream Apply 1 application topically 2 (two) times daily. 28.35 g 3  . ondansetron (ZOFRAN ODT) 4 MG disintegrating tablet Take 1 tablet (4 mg total) by mouth every 8 (eight) hours as needed for nausea. 15 tablet 0  . ondansetron (ZOFRAN) 8 MG tablet Take 1 tablet (8 mg total) by mouth every 8 (eight) hours as needed for nausea or vomiting. 20 tablet 0  . PARoxetine (PAXIL) 10 MG tablet Take 10 mg by mouth at bedtime.     Vladimir Faster Glycol-Propyl Glycol (SYSTANE OP) Place 1 drop into both eyes daily as needed (for dry eyes).     . potassium chloride (K-DUR,KLOR-CON) 10 MEQ tablet Take 10 mEq by mouth at bedtime.     . pregabalin (LYRICA) 50 MG capsule Take 1 capsule (50 mg total) by mouth 2 (two) times daily. 60 capsule 3  . sulfamethoxazole-trimethoprim (BACTRIM DS,SEPTRA DS) 800-160 MG tablet Take 1 tablet by mouth 2 (two)  times daily.    . traMADol (ULTRAM) 50 MG tablet Take 1 tablet (50 mg total) by mouth 2 (two) times daily as needed for moderate pain (mild pain). 20 tablet 0   No current facility-administered medications for this visit.     PHYSICAL EXAMINATION: ECOG PERFORMANCE STATUS: 1 - Symptomatic but completely ambulatory  Vitals:   09/07/18 1132  BP: (!) 120/49  Pulse: 65  Resp: 18  Temp: 99.1 F (37.3 C)  SpO2: 93%   Filed Weights   09/07/18 1132  Weight: 219 lb 14.4 oz (99.7 kg)    GENERAL: alert, no distress and comfortable SKIN: skin color, texture, turgor are normal, no rashes or significant lesions EYES: normal, Conjunctiva are pink and non-injected, sclera clear OROPHARYNX: no exudate, no erythema and lips, buccal mucosa, and tongue normal  NECK: supple, thyroid normal size, non-tender, without nodularity LYMPH: no palpable lymphadenopathy in the cervical, axillary or inguinal LUNGS: clear to auscultation and percussion with normal breathing effort HEART: regular rate & rhythm and no murmurs and no lower extremity edema ABDOMEN: abdomen soft, non-tender and  normal bowel sounds MUSCULOSKELETAL: no cyanosis of digits and no clubbing  NEURO: alert & oriented x 3 with fluent speech, no focal motor/sensory deficits EXTREMITIES: No lower extremity edema  LABORATORY DATA:  I have reviewed the data as listed CMP Latest Ref Rng & Units 09/07/2018 07/27/2018 06/14/2018  Glucose 70 - 99 mg/dL 142(H) 169(H) 100(H)  BUN 8 - 23 mg/dL _0 Creatinine 0.44 - 1.00 mg/dL 0.85 0.79 0.78  Sodium 135 - 145 mmol/L 141 141 143  Potassium 3.5 - 5.1 mmol/L 3.7 3.8 3.8  Chloride 98 - 111 mmol/L 99 104 102  CO2 22 - 32 mmol/L 32 28 34(H)  Calcium 8.9 - 10.3 mg/dL 8.9 9.0 9.2  Total Protein 6.5 - 8.1 g/dL 6.6 6.3(L) 6.5  Total Bilirubin 0.3 - 1.2 mg/dL 0.4 0.4 0.4  Alkaline Phos 38 - 126 U/L 100 86 83  AST 15 - 41 U/L 34 31 29  ALT 0 - 44 U/L _1 Lab Results  Component Value Date   WBC 3.1 (L) 09/07/2018   HGB 12.4 09/07/2018   HCT 37.3 09/07/2018   MCV 95.9 09/07/2018   PLT 152 09/07/2018   NEUTROABS 1.8 09/07/2018    ASSESSMENT & PLAN:  Malignant neoplasm of upper-outer quadrant of left breast in female, estrogen receptor positive ([place]) 05/13/17: [place] biopsy: Left breast biopsy 2 o'clock position 8 cm from nipple ultrasound-guided biopsy: Grade 2 IDC, ER 50%, PR 3%, HER-2 equivocal Ki-67 15% nipple area biopsy grade 2 IDC, ER 100% positive, PR negative, HER-2 equivocal, Ki-67 40%  06/11/17:Left Mastectomy: 2 foci of IDC 2.2 and 1 cm Grade 2, 1/2 LN Positive; Grade 2 IDC, ER 50%, PR 3%, HER-2 equivocal Ki-67 15% nipple area biopsy grade 2 IDC, ER 100% positive, PR negative, HER-2 equivocal, Ki-67 40% T2N1a Stage 1B I discussed with her that given the fact that she has lymph node positive disease, this is clinically high risk disease.  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, potential benefit of treatment at 5 years: 94.6% distant metastasis free interval for patients treated with chemotherapy  Treatment summary: 1.Adjuvant  chemotherapy with dose dense Adriamycin and Cytoxan x4 followed by Taxol weekly x6 (stopped for neuropathy)07/23/2017 to 11/10/2017 Herceptin adjuvant treatment started 12/29/2017 2.followed by radiation [place]:Completed 02/22/2018 3.Followed by adjuvant antiestrogen therapy with Anastrozole 37m daily x5 to 7 years ----------------------------------------------------------------------- Repeat assessment  revealed HER-2 negative focus but also HER-2 positivefocus making her tumor heterogeneous.  Current treatment: Herceptinmaintenance therapy,anastrozole started 03/23/2018 She will complete Herceptin by 12/21/2018  Herceptin toxicities: Tolerating well.  Anastrozole toxicities: She has hot flashes when she exposes herself to heat.  This is usually followed by severe itching. Her husband was diagnosed with high risk MDS and is currently receiving [facility] at [facility].  She reviewed with me his most recent blood counts which showed worsening pancytopenia.  She is concerned about his overall prognosis.  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  She has been trying to massage herself.  But Bumex has been helping her.  I renewed her prescription for Bumex.  Chemo-induced peripheral neuropathy: I increase the dosage of Lyrica because the symptoms are continuing to persist.  Prognosis: Patient discussed with me about her own personal prognosis. I discussed the role of neratinib after the conclusion of Herceptin.  Return to clinic every 3 weeks for Herceptin every 6 weeks for follow-up with me. No orders of the defined types were placed in this encounter.  The patient has a good understanding of the overall plan. she agrees with it. she will call with any problems that may develop before the next visit here.  Nicholas Lose, MD 09/07/2018  Julious Oka Dorshimer am acting as scribe for Dr. Nicholas Lose.  I have reviewed the above documentation for  accuracy and completeness, and I agree with the above.

## 2018-09-07 ENCOUNTER — Inpatient Hospital Stay: Payer: Medicare Other | Attending: Hematology and Oncology

## 2018-09-07 ENCOUNTER — Inpatient Hospital Stay: Payer: Medicare Other

## 2018-09-07 ENCOUNTER — Other Ambulatory Visit: Payer: Self-pay

## 2018-09-07 ENCOUNTER — Inpatient Hospital Stay (HOSPITAL_BASED_OUTPATIENT_CLINIC_OR_DEPARTMENT_OTHER): Payer: Medicare Other | Admitting: Hematology and Oncology

## 2018-09-07 DIAGNOSIS — Z79899 Other long term (current) drug therapy: Secondary | ICD-10-CM | POA: Insufficient documentation

## 2018-09-07 DIAGNOSIS — C50412 Malignant neoplasm of upper-outer quadrant of left female breast: Secondary | ICD-10-CM | POA: Insufficient documentation

## 2018-09-07 DIAGNOSIS — Z5112 Encounter for antineoplastic immunotherapy: Secondary | ICD-10-CM | POA: Diagnosis not present

## 2018-09-07 DIAGNOSIS — I890 Lymphedema, not elsewhere classified: Secondary | ICD-10-CM | POA: Diagnosis not present

## 2018-09-07 DIAGNOSIS — Z9221 Personal history of antineoplastic chemotherapy: Secondary | ICD-10-CM

## 2018-09-07 DIAGNOSIS — Z170 Estrogen receptor positive status [ER+]: Secondary | ICD-10-CM

## 2018-09-07 DIAGNOSIS — D61818 Other pancytopenia: Secondary | ICD-10-CM | POA: Insufficient documentation

## 2018-09-07 DIAGNOSIS — G620 Drug-induced polyneuropathy: Secondary | ICD-10-CM | POA: Insufficient documentation

## 2018-09-07 DIAGNOSIS — Z79811 Long term (current) use of aromatase inhibitors: Secondary | ICD-10-CM

## 2018-09-07 DIAGNOSIS — Z923 Personal history of irradiation: Secondary | ICD-10-CM

## 2018-09-07 DIAGNOSIS — T451X5A Adverse effect of antineoplastic and immunosuppressive drugs, initial encounter: Secondary | ICD-10-CM | POA: Diagnosis not present

## 2018-09-07 DIAGNOSIS — Z95828 Presence of other vascular implants and grafts: Secondary | ICD-10-CM

## 2018-09-07 LAB — CMP (CANCER CENTER ONLY)
ALT: 31 U/L (ref 0–44)
AST: 34 U/L (ref 15–41)
Albumin: 3.6 g/dL (ref 3.5–5.0)
Alkaline Phosphatase: 100 U/L (ref 38–126)
Anion gap: 10 (ref 5–15)
BUN: 12 mg/dL (ref 8–23)
CO2: 32 mmol/L (ref 22–32)
Calcium: 8.9 mg/dL (ref 8.9–10.3)
Chloride: 99 mmol/L (ref 98–111)
Creatinine: 0.85 mg/dL (ref 0.44–1.00)
GFR, Est AFR Am: >60 mL/min (ref >60)
GFR, Estimated: >60 mL/min (ref >60)
Glucose, Bld: 142 mg/dL — ABNORMAL HIGH (ref 70–99)
Potassium: 3.7 mmol/L (ref 3.5–5.1)
Sodium: 141 mmol/L (ref 135–145)
Total Bilirubin: 0.4 mg/dL (ref 0.3–1.2)
Total Protein: 6.6 g/dL (ref 6.5–8.1)

## 2018-09-07 LAB — CBC WITH DIFFERENTIAL (CANCER CENTER ONLY)
Abs Immature Granulocytes: 0 10*3/uL (ref 0.00–0.07)
Basophils Absolute: 0 10*3/uL (ref 0.0–0.1)
Basophils Relative: 1 %
Eosinophils Absolute: 0.1 10*3/uL (ref 0.0–0.5)
Eosinophils Relative: 2 %
HCT: 37.3 % (ref 36.0–46.0)
Hemoglobin: 12.4 g/dL (ref 12.0–15.0)
Immature Granulocytes: 0 %
Lymphocytes Relative: 27 %
Lymphs Abs: 0.8 10*3/uL (ref 0.7–4.0)
MCH: 31.9 pg (ref 26.0–34.0)
MCHC: 33.2 g/dL (ref 30.0–36.0)
MCV: 95.9 fL (ref 80.0–100.0)
Monocytes Absolute: 0.4 10*3/uL (ref 0.1–1.0)
Monocytes Relative: 11 %
Neutro Abs: 1.8 10*3/uL (ref 1.7–7.7)
Neutrophils Relative %: 59 %
Platelet Count: 152 10*3/uL (ref 150–400)
RBC: 3.89 MIL/uL (ref 3.87–5.11)
RDW: 12.7 % (ref 11.5–15.5)
WBC Count: 3.1 10*3/uL — ABNORMAL LOW (ref 4.0–10.5)
nRBC: 0 % (ref 0.0–0.2)

## 2018-09-07 MED ORDER — PREGABALIN 75 MG PO CAPS: 75.0000 mg | ORAL_CAPSULE | Freq: Two times a day (BID) | ORAL | 3 refills | Status: DC

## 2018-09-07 MED ORDER — TRASTUZUMAB CHEMO 150 MG IV SOLR
600.0000 mg | Freq: Once | INTRAVENOUS | Status: AC
  Administered 2018-09-07: 600 mg via INTRAVENOUS
  Filled 2018-09-07: qty 28.57

## 2018-09-07 MED ORDER — FAMOTIDINE IN NACL 20-0.9 MG/50ML-% IV SOLN
20.0000 mg | Freq: Once | INTRAVENOUS | Status: AC
  Administered 2018-09-07: 20 mg via INTRAVENOUS

## 2018-09-07 MED ORDER — FAMOTIDINE IN NACL 20-0.9 MG/50ML-% IV SOLN
INTRAVENOUS | Status: AC
  Filled 2018-09-07: qty 50

## 2018-09-07 MED ORDER — ACETAMINOPHEN 325 MG PO TABS
650.0000 mg | ORAL_TABLET | Freq: Once | ORAL | Status: AC
  Administered 2018-09-07: 650 mg via ORAL

## 2018-09-07 MED ORDER — HEPARIN SOD (PORK) LOCK FLUSH 100 UNIT/ML IV SOLN
500.0000 [IU] | Freq: Once | INTRAVENOUS | Status: AC | PRN
  Administered 2018-09-07: 500 [IU]
  Filled 2018-09-07: qty 5

## 2018-09-07 MED ORDER — BUMETANIDE 1 MG PO TABS: 1.0000 mg | ORAL_TABLET | Freq: Every day | ORAL | 3 refills | Status: DC

## 2018-09-07 MED ORDER — SODIUM CHLORIDE 0.9% FLUSH
10.0000 mL | INTRAVENOUS | Status: DC | PRN
  Administered 2018-09-07: 10 mL
  Filled 2018-09-07: qty 10

## 2018-09-07 MED ORDER — ACETAMINOPHEN 325 MG PO TABS
ORAL_TABLET | ORAL | Status: AC
  Filled 2018-09-07: qty 2

## 2018-09-07 MED ORDER — SODIUM CHLORIDE 0.9 % IV SOLN
Freq: Once | INTRAVENOUS | Status: AC
  Administered 2018-09-07: 13:00:00 via INTRAVENOUS
  Filled 2018-09-07: qty 250

## 2018-09-07 MED ORDER — METHYLPREDNISOLONE SODIUM SUCC 125 MG IJ SOLR
60.0000 mg | Freq: Once | INTRAMUSCULAR | Status: AC
  Administered 2018-09-07: 13:00:00 60 mg via INTRAVENOUS

## 2018-09-07 MED ORDER — DIPHENHYDRAMINE HCL 25 MG PO CAPS
25.0000 mg | ORAL_CAPSULE | Freq: Once | ORAL | Status: AC
  Administered 2018-09-07: 25 mg via ORAL

## 2018-09-07 MED ORDER — DIPHENHYDRAMINE HCL 25 MG PO CAPS
ORAL_CAPSULE | ORAL | Status: AC
  Filled 2018-09-07: qty 1

## 2018-09-07 MED ORDER — METHYLPREDNISOLONE SODIUM SUCC 125 MG IJ SOLR
INTRAMUSCULAR | Status: AC
  Filled 2018-09-07: qty 2

## 2018-09-07 NOTE — Patient Instructions (Signed)
[facility] Discharge Instructions for Patients Receiving Chemotherapy  Today you received the following chemotherapy agents Trastuzumab (HERCEPTIN).  To help prevent nausea and vomiting after your treatment, we encourage you to take your nausea medication as prescribed.  If you develop nausea and vomiting that is not controlled by your nausea medication, call the clinic.   BELOW ARE SYMPTOMS THAT SHOULD BE REPORTED IMMEDIATELY:  *FEVER GREATER THAN 100.5 F  *CHILLS WITH OR WITHOUT FEVER  NAUSEA AND VOMITING THAT IS NOT CONTROLLED WITH YOUR NAUSEA MEDICATION  *UNUSUAL SHORTNESS OF BREATH  *UNUSUAL BRUISING OR BLEEDING  TENDERNESS IN MOUTH AND THROAT WITH OR WITHOUT PRESENCE OF ULCERS  *URINARY PROBLEMS  *BOWEL PROBLEMS  UNUSUAL RASH Items with * indicate a potential emergency and should be followed up as soon as possible.  Feel free to call the clinic should you have any questions or concerns. The clinic phone number is [phone].  Please show the CHEMO ALERT CARD at check-in to the Emergency Department and triage nurse.  Coronavirus (COVID-19) Are you at risk?  Are you at risk for the Coronavirus (COVID-19)?  To be considered HIGH RISK for Coronavirus (COVID-19), you have to meet the following criteria:  . Traveled to [place], [place], [place], [place] or [place]; or in the [place] to [place], [place], [place], or [place]; and have fever, cough, and shortness of breath within the last 2 weeks of travel OR . Been in close contact with a person diagnosed with COVID-19 within the last 2 weeks and have fever, cough, and shortness of breath . IF YOU DO NOT MEET THESE CRITERIA, YOU ARE CONSIDERED LOW RISK FOR COVID-19.  What to do if you are HIGH RISK for COVID-19?  . If you are having a medical emergency, call 911. . Seek medical care right away. Before you go to a doctor's office, urgent care or emergency department, call ahead and tell them  about your recent travel, contact with someone diagnosed with COVID-19, and your symptoms. You should receive instructions from your physician's office regarding next steps of care.  . When you arrive at healthcare provider, tell the healthcare staff immediately you have returned from visiting [place], [place], [place], [place] or [place]; or traveled in the [place] to [place], [place], [place], or [place]; in the last two weeks or you have been in close contact with a person diagnosed with COVID-19 in the last 2 weeks.   . Tell the health care staff about your symptoms: fever, cough and shortness of breath. . After you have been seen by a medical provider, you will be either: o Tested for (COVID-19) and discharged home on quarantine except to seek medical care if symptoms worsen, and asked to  - Stay home and avoid contact with others until you get your results (4-5 days)  - Avoid travel on public transportation if possible (such as bus, train, or airplane) or o Sent to the Emergency Department by EMS for evaluation, COVID-19 testing, and possible admission depending on your condition and test results.  What to do if you are LOW RISK for COVID-19?  Reduce your risk of any infection by using the same precautions used for avoiding the common cold or flu:  . Wash your hands often with soap and warm water for at least 20 seconds.  If soap and water are not readily available, use an alcohol-based hand sanitizer with at least 60% alcohol.  . If coughing or sneezing,   cover your mouth and nose by coughing or sneezing into the elbow areas of your shirt or coat, into a tissue or into your sleeve (not your hands). . Avoid shaking hands with others and consider head nods or verbal greetings only. . Avoid touching your eyes, nose, or mouth with unwashed hands.  . Avoid close contact with people who are sick. . Avoid places or events with large numbers of people in one location, like concerts or  sporting events. . Carefully consider travel plans you have or are making. . If you are planning any travel outside or inside the [place], visit the CDC's Travelers' Health webpage for the latest health notices. . If you have some symptoms but not all symptoms, continue to monitor at home and seek medical attention if your symptoms worsen. . If you are having a medical emergency, call 911.   ADDITIONAL HEALTHCARE OPTIONS FOR PATIENTS  [facility] / e-Visit: [email or web address]         [facility]: [phone]  [facility]: [phone]                   [facility]: [phone]   

## 2018-09-13 ENCOUNTER — Telehealth: Payer: Self-pay | Admitting: Hematology and Oncology

## 2018-09-13 NOTE — Telephone Encounter (Signed)
Faxed most recent office note to [facility] @ [phone] Attn: Hoyle Sauer

## 2018-09-14 ENCOUNTER — Other Ambulatory Visit: Payer: Self-pay

## 2018-09-14 MED ORDER — BUMETANIDE 1 MG PO TABS: 1.0000 mg | ORAL_TABLET | Freq: Two times a day (BID) | ORAL | 3 refills | Status: DC

## 2018-09-20 ENCOUNTER — Other Ambulatory Visit: Payer: Self-pay | Admitting: Hematology and Oncology

## 2018-09-28 ENCOUNTER — Inpatient Hospital Stay: Payer: Medicare Other

## 2018-09-28 ENCOUNTER — Other Ambulatory Visit: Payer: Self-pay

## 2018-09-28 ENCOUNTER — Other Ambulatory Visit: Payer: Self-pay | Admitting: Hematology and Oncology

## 2018-09-28 VITALS — BP 137/64 | HR 70 | Temp 98.7°F | Resp 18 | Wt 223.1 lb

## 2018-09-28 DIAGNOSIS — C50412 Malignant neoplasm of upper-outer quadrant of left female breast: Secondary | ICD-10-CM

## 2018-09-28 DIAGNOSIS — Z5112 Encounter for antineoplastic immunotherapy: Secondary | ICD-10-CM | POA: Diagnosis not present

## 2018-09-28 MED ORDER — HEPARIN SOD (PORK) LOCK FLUSH 100 UNIT/ML IV SOLN
500.0000 [IU] | Freq: Once | INTRAVENOUS | Status: AC | PRN
  Administered 2018-09-28: 500 [IU]
  Filled 2018-09-28: qty 5

## 2018-09-28 MED ORDER — DIPHENHYDRAMINE HCL 25 MG PO CAPS
ORAL_CAPSULE | ORAL | Status: AC
  Filled 2018-09-28: qty 1

## 2018-09-28 MED ORDER — ACETAMINOPHEN 325 MG PO TABS
650.0000 mg | ORAL_TABLET | Freq: Once | ORAL | Status: AC
  Administered 2018-09-28: 650 mg via ORAL

## 2018-09-28 MED ORDER — FAMOTIDINE IN NACL 20-0.9 MG/50ML-% IV SOLN
INTRAVENOUS | Status: AC
  Filled 2018-09-28: qty 50

## 2018-09-28 MED ORDER — METHYLPREDNISOLONE SODIUM SUCC 125 MG IJ SOLR
60.0000 mg | Freq: Once | INTRAMUSCULAR | Status: AC
  Administered 2018-09-28: 60 mg via INTRAVENOUS

## 2018-09-28 MED ORDER — TRASTUZUMAB CHEMO 150 MG IV SOLR
600.0000 mg | Freq: Once | INTRAVENOUS | Status: AC
  Administered 2018-09-28: 600 mg via INTRAVENOUS
  Filled 2018-09-28: qty 28.57

## 2018-09-28 MED ORDER — DIPHENHYDRAMINE HCL 25 MG PO CAPS
25.0000 mg | ORAL_CAPSULE | Freq: Once | ORAL | Status: AC
  Administered 2018-09-28: 25 mg via ORAL

## 2018-09-28 MED ORDER — FAMOTIDINE IN NACL 20-0.9 MG/50ML-% IV SOLN
20.0000 mg | Freq: Once | INTRAVENOUS | Status: AC
  Administered 2018-09-28: 20 mg via INTRAVENOUS

## 2018-09-28 MED ORDER — SODIUM CHLORIDE 0.9 % IV SOLN
Freq: Once | INTRAVENOUS | Status: AC
  Administered 2018-09-28: 15:00:00 via INTRAVENOUS
  Filled 2018-09-28: qty 250

## 2018-09-28 MED ORDER — METHYLPREDNISOLONE SODIUM SUCC 125 MG IJ SOLR
INTRAMUSCULAR | Status: AC
  Filled 2018-09-28: qty 2

## 2018-09-28 MED ORDER — ACETAMINOPHEN 325 MG PO TABS
ORAL_TABLET | ORAL | Status: AC
  Filled 2018-09-28: qty 2

## 2018-09-28 MED ORDER — SODIUM CHLORIDE 0.9% FLUSH
10.0000 mL | INTRAVENOUS | Status: DC | PRN
  Administered 2018-09-28: 10 mL
  Filled 2018-09-28: qty 10

## 2018-09-28 NOTE — Patient Instructions (Signed)
[facility] Discharge Instructions for Patients Receiving Chemotherapy  Today you received the following chemotherapy agents Herceptin  To help prevent nausea and vomiting after your treatment, we encourage you to take your nausea medication as directed   If you develop nausea and vomiting that is not controlled by your nausea medication, call the clinic.   BELOW ARE SYMPTOMS THAT SHOULD BE REPORTED IMMEDIATELY:  *FEVER GREATER THAN 100.5 F  *CHILLS WITH OR WITHOUT FEVER  NAUSEA AND VOMITING THAT IS NOT CONTROLLED WITH YOUR NAUSEA MEDICATION  *UNUSUAL SHORTNESS OF BREATH  *UNUSUAL BRUISING OR BLEEDING  TENDERNESS IN MOUTH AND THROAT WITH OR WITHOUT PRESENCE OF ULCERS  *URINARY PROBLEMS  *BOWEL PROBLEMS  UNUSUAL RASH Items with * indicate a potential emergency and should be followed up as soon as possible.  Feel free to call the clinic should you have any questions or concerns. The clinic phone number is [phone].  Please show the CHEMO ALERT CARD at check-in to the Emergency Department and triage nurse.   

## 2018-10-05 NOTE — Progress Notes (Signed)
The following biosimilar Ogivri (trastuzumab-dkst) NOT used in this patient. Patient had a reaction delayed to 1st dose of Herceptin.  After she went home she had 2 hours of severe chills and abdominal pain radiating to the back  Because of this MD elected to add Pepcid and Solu-Medrol to tx as premeds.  Kennith Center, Pharm.D., CPP 10/05/2018@8 :44 PM

## 2018-10-12 NOTE — Assessment & Plan Note (Signed)
/  7/[name] biopsy: Left breast biopsy 2 o'clock position 8 cm from nipple ultrasound-guided biopsy: Grade 2 IDC, ER 50%, PR 3%, HER-2 equivocal Ki-67 15% nipple area biopsy grade 2 IDC, ER 100% positive, PR negative, HER-2 equivocal, Ki-67 40%  06/11/17:Left Mastectomy: 2 foci of IDC 2.2 and 1 cm Grade 2, 1/2 LN Positive; Grade 2 IDC, ER 50%, PR 3%, HER-2 equivocal Ki-67 15% nipple area biopsy grade 2 IDC, ER 100% positive, PR negative, HER-2 equivocal, Ki-67 40% T2N1a Stage 1B I discussed with her that given the fact that she has lymph node positive disease, this is clinically high risk disease.  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, potential benefit of treatment at 5 years: 94.6% distant metastasis free interval for patients treated with chemotherapy  Treatment summary: 1.Adjuvant chemotherapy with dose dense Adriamycin and Cytoxan x4 followed by Taxol weekly x6 (stopped for neuropathy)07/23/2017 to 11/10/2017 Herceptin adjuvant treatment started 12/29/2017 2.followed by radiation [place]:Completed 02/22/2018 3.Followed by adjuvant antiestrogen therapy with Anastrozole '1mg'$  daily x5 to 7 years ----------------------------------------------------------------------- Repeat assessment revealed HER-2 negative focus but also HER-2 positivefocus making her tumor heterogeneous.  Current treatment: Herceptinmaintenance therapy,anastrozole started 03/23/2018 She will complete Herceptin by 12/21/2018  Herceptin toxicities: Tolerating well.  Anastrozole toxicities: She has hot flashes when she exposes herself to heat.  This is usually followed by severe itching. Her husband was diagnosed with high risk MDS and is currently receiving Vidaza at [facility].  She reviewed with me his most recent blood counts which showed worsening pancytopenia.  She is concerned about his overall prognosis.  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   She has been trying to massage herself.  But Bumex has been helping her.  I renewed her prescription for Bumex.  Chemo-induced peripheral neuropathy: I increase the dosage of Lyrica because the symptoms are continuing to persist. We will continue discussions about neratinib after the conclusion of Herceptin. Return to clinic every 3 weeks for Herceptin every 6 weeks of follow-up with me.

## 2018-10-18 ENCOUNTER — Other Ambulatory Visit: Payer: Self-pay | Admitting: Hematology and Oncology

## 2018-10-18 ENCOUNTER — Other Ambulatory Visit: Payer: Self-pay | Admitting: *Deleted

## 2018-10-18 DIAGNOSIS — C50412 Malignant neoplasm of upper-outer quadrant of left female breast: Secondary | ICD-10-CM

## 2018-10-18 NOTE — Progress Notes (Signed)
Patient Care Team: [name], MD as PCP - General (Family Medicine)  DIAGNOSIS:    ICD-10-CM   1. Malignant neoplasm of upper-outer quadrant of left breast in female, estrogen receptor positive ([place])  C50.412    Z17.0     SUMMARY OF ONCOLOGIC HISTORY: Oncology History  Malignant neoplasm of upper-outer quadrant of left breast in female, estrogen receptor positive ([place])  05/13/2017 Initial Diagnosis   [place] biopsy: Left breast biopsy 2 o'clock position 8 cm from nipple ultrasound-guided biopsy: Grade 2 IDC, ER 50%, PR 3%, HER-2 equivocal Ki-67 15% nipple area biopsy grade 2 IDC, ER 100% positive, PR negative, HER-2 equivocal, Ki-67 40%   05/31/2017 Breast MRI   Left breast UOQ 230 position: 2 x 1.9 x 1.9 cm mass, second mass left breast UOQ 230 position: 1 x 1 x 0.7 cm, 2 more enhancing adjacent 3 and 5 mm nodules slightly superiorly midway between the 2 masses.  No abnormal lymph nodes, right breast normal   06/11/2017 Surgery   Left Mastectomy: 2 foci of IDC 2.2 and 1 cm Grade 2, 1/2 LN Positive; Grade 2 IDC, ER 50%, PR 3%, HER-2 equivocal Ki-67 15% nipple area biopsy grade 2 IDC, ER 100% positive, PR negative, HER-2 equivocal, Ki-67 40% T2N1a Stage 1B   06/25/2017 Miscellaneous   Mammaprint high risk luminal type B   07/23/2017 - 11/10/2017 Chemotherapy   Adjuvant chemotherapy with dose dense Adriamycin and Cytoxan x4 followed by Taxol weekly x6   12/10/2017 Pathology Results   Repeat analysis of pathology confirmed that patient has heterogeneous HER-2 signaling.  There is equivocal IHC 2+ and fish negative areas where the ratio was 1.49 and copy number of 2.9.  There is one area that is HER-2 +3+ by [facility]   12/28/2017 - 02/22/2018 Radiation Therapy   Adj XRT at [facility]   12/29/2017 -  Chemotherapy   The patient had trastuzumab (HERCEPTIN) 798 mg in sodium chloride 0.9 % 250 mL chemo infusion, 8 mg/kg = 798 mg, Intravenous,  Once, 13 of 17 cycles Administration: 798  mg (12/29/2017), 588 mg (01/19/2018), 600 mg (03/23/2018), 600 mg (05/04/2018), 600 mg (05/26/2018), 600 mg (06/14/2018), 600 mg (07/06/2018), 600 mg (07/27/2018), 600 mg (08/17/2018), 600 mg (09/07/2018), 600 mg (09/28/2018), 600 mg (02/09/2018), 600 mg (03/03/2018)  for chemotherapy treatment.    03/23/2018 -  Anti-estrogen oral therapy   Anastrozole, '1mg'$  daily stopped due to itching; switched to letrozole 06/2018     CHIEF COMPLIANT: Follow-up of anastrozole and Herceptin  INTERVAL HISTORY: Breanna [name] is a 73 y.o. with above-mentioned history of HER-2 positive breast cancer treated with left mastectomy, adjuvant chemotherapy, radiation, and who is currently on Herceptin maintenance with anastrozole. She presents to the clinic today for treatment.  REVIEW OF SYSTEMS:   Constitutional: Denies fevers, chills or abnormal weight loss Eyes: Denies blurriness of vision Ears, nose, mouth, throat, and face: Denies mucositis or sore throat Respiratory: Denies cough, dyspnea or wheezes Cardiovascular: Denies palpitation, chest discomfort Gastrointestinal: Denies nausea, heartburn or change in bowel habits Skin: Denies abnormal skin rashes Lymphatics: Denies new lymphadenopathy or easy bruising Neurological: Denies numbness, tingling or new weaknesses Behavioral/Psych: Mood is stable, no new changes  Extremities: No lower extremity edema Breast: denies any pain or lumps or nodules in either breasts All other systems were reviewed with the patient and are negative.  I have reviewed the past medical history, past surgical history, social history and family history with the patient and they are unchanged from  previous note.  ALLERGIES:  is allergic to atorvastatin; vancomycin; ancef [cefazolin]; lactose; nasonex [mometasone furoate]; and neurontin [gabapentin].  MEDICATIONS:  Current Outpatient Medications  Medication Sig Dispense Refill  . acetaminophen (TYLENOL) 500 MG tablet Take 1,000 mg by mouth 3  (three) times daily as needed for headache (pain).     Marland Kitchen amLODipine (NORVASC) 5 MG tablet Take 5 mg by mouth daily.     Marland Kitchen anastrozole (ARIMIDEX) 1 MG tablet Take 1 tablet (1 mg total) by mouth daily. 90 tablet 3  . atorvastatin (LIPITOR) 20 MG tablet Take 20 mg by mouth at bedtime.    . B Complex-C (SUPER B COMPLEX PO) Take 1 tablet by mouth daily.    . bumetanide (BUMEX) 1 MG tablet Take 1 tablet (1 mg total) by mouth 2 (two) times daily. 60 tablet 3  . Calcium Carb-Cholecalciferol (CALCIUM 600-D PO) Take 1 tablet by mouth at bedtime.    . carvedilol (COREG) 25 MG tablet Take 1 tablet (25 mg total) by mouth 2 (two) times daily with a meal. 60 tablet 0  . cetirizine (ZYRTEC) 10 MG tablet Take 10 mg by mouth at bedtime.     . clopidogrel (PLAVIX) 75 MG tablet Take 1 tablet (75 mg total) by mouth every evening. (Patient taking differently: Take 75 mg by mouth daily. ) 30 tablet 0  . diclofenac sodium (VOLTAREN) 1 % GEL Apply 4 g topically at bedtime. 100 g 6  . diphenoxylate-atropine (LOMOTIL) 2.5-0.025 MG tablet Take 1 tablet by mouth 4 (four) times daily as needed for diarrhea or loose stools. 30 tablet 0  . doxycycline (VIBRAMYCIN) 100 MG capsule Take 1 capsule (100 mg total) by mouth 2 (two) times daily. One po bid x 7 days 14 capsule 0  . ferrous sulfate 325 (65 FE) MG tablet Take 325 mg by mouth daily with breakfast.    . fexofenadine (ALLEGRA) 180 MG tablet Take 180 mg by mouth daily.    . fluticasone (FLONASE) 50 MCG/ACT nasal spray Place 1 spray into both nostrils daily as needed (seasonal allergies).     Marland Kitchen ipratropium (ATROVENT) 0.03 % nasal spray Place 2 sprays into both nostrils every 8 (eight) hours.    . lansoprazole (PREVACID) 15 MG capsule Take 15 mg by mouth daily.     Marland Kitchen letrozole (FEMARA) 2.5 MG tablet Take 1 tablet by mouth once daily 90 tablet 0  . lidocaine-prilocaine (EMLA) cream Apply 1 application topically as needed. 30 g 0  . lisinopril (PRINIVIL,ZESTRIL) 40 MG tablet Take  40 mg by mouth daily.    . miconazole (MICATIN) 2 % cream Apply 1 application topically 2 (two) times daily. 28.35 g 3  . ondansetron (ZOFRAN ODT) 4 MG disintegrating tablet Take 1 tablet (4 mg total) by mouth every 8 (eight) hours as needed for nausea. 15 tablet 0  . ondansetron (ZOFRAN) 8 MG tablet Take 1 tablet (8 mg total) by mouth every 8 (eight) hours as needed for nausea or vomiting. 20 tablet 0  . PARoxetine (PAXIL) 10 MG tablet Take 10 mg by mouth at bedtime.     Vladimir Faster Glycol-Propyl Glycol (SYSTANE OP) Place 1 drop into both eyes daily as needed (for dry eyes).     . potassium chloride (K-DUR,KLOR-CON) 10 MEQ tablet Take 10 mEq by mouth at bedtime.     . pregabalin (LYRICA) 75 MG capsule Take 1 capsule (75 mg total) by mouth 2 (two) times daily. 90 capsule 3  . sulfamethoxazole-trimethoprim (BACTRIM DS,SEPTRA  DS) 800-160 MG tablet Take 1 tablet by mouth 2 (two) times daily.    . traMADol (ULTRAM) 50 MG tablet TAKE 1 TABLET BY MOUTH EVERY 6 HOURS AS NEEDED FOR  MILD  PAIN 60 tablet 0   No current facility-administered medications for this visit.     PHYSICAL EXAMINATION: ECOG PERFORMANCE STATUS: 1 - Symptomatic but completely ambulatory  Vitals:   10/19/18 1149  BP: (!) 130/52  Pulse: 63  Resp: 18  Temp: 98.3 F (36.8 C)  SpO2: 96%   Filed Weights   10/19/18 1149  Weight: 220 lb 14.4 oz (100.2 kg)    GENERAL: alert, no distress and comfortable SKIN: skin color, texture, turgor are normal, no rashes or significant lesions EYES: normal, Conjunctiva are pink and non-injected, sclera clear OROPHARYNX: no exudate, no erythema and lips, buccal mucosa, and tongue normal  NECK: supple, thyroid normal size, non-tender, without nodularity LYMPH: no palpable lymphadenopathy in the cervical, axillary or inguinal LUNGS: clear to auscultation and percussion with normal breathing effort HEART: regular rate & rhythm and no murmurs and no lower extremity edema ABDOMEN: abdomen  soft, non-tender and normal bowel sounds MUSCULOSKELETAL: no cyanosis of digits and no clubbing  NEURO: alert & oriented x 3 with fluent speech, no focal motor/sensory deficits EXTREMITIES: No lower extremity edema  LABORATORY DATA:  I have reviewed the data as listed CMP Latest Ref Rng & Units 09/07/2018 07/27/2018 06/14/2018  Glucose 70 - 99 mg/dL 142(H) 169(H) 100(H)  BUN 8 - 23 mg/dL '12 9 15  '$ Creatinine 0.44 - 1.00 mg/dL 0.85 0.79 0.78  Sodium 135 - 145 mmol/L 141 141 143  Potassium 3.5 - 5.1 mmol/L 3.7 3.8 3.8  Chloride 98 - 111 mmol/L 99 104 102  CO2 22 - 32 mmol/L 32 28 34(H)  Calcium 8.9 - 10.3 mg/dL 8.9 9.0 9.2  Total Protein 6.5 - 8.1 g/dL 6.6 6.3(L) 6.5  Total Bilirubin 0.3 - 1.2 mg/dL 0.4 0.4 0.4  Alkaline Phos 38 - 126 U/L 100 86 83  AST 15 - 41 U/L 34 31 29  ALT 0 - 44 U/L '31 31 26    '$ Lab Results  Component Value Date   WBC 2.8 (L) 10/19/2018   HGB 12.6 10/19/2018   HCT 37.6 10/19/2018   MCV 97.2 10/19/2018   PLT 161 10/19/2018   NEUTROABS 1.6 (L) 10/19/2018    ASSESSMENT & PLAN:  Malignant neoplasm of upper-outer quadrant of left breast in female, estrogen receptor positive ([place]) /7/19: [place] biopsy: Left breast biopsy 2 o'clock position 8 cm from nipple ultrasound-guided biopsy: Grade 2 IDC, ER 50%, PR 3%, HER-2 equivocal Ki-67 15% nipple area biopsy grade 2 IDC, ER 100% positive, PR negative, HER-2 equivocal, Ki-67 40%  06/11/17:Left Mastectomy: 2 foci of IDC 2.2 and 1 cm Grade 2, 1/2 LN Positive; Grade 2 IDC, ER 50%, PR 3%, HER-2 equivocal Ki-67 15% nipple area biopsy grade 2 IDC, ER 100% positive, PR negative, HER-2 equivocal, Ki-67 40% T2N1a Stage 1B I discussed with her that given the fact that she has lymph node positive disease, this is clinically high risk disease.  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, potential benefit of treatment at 5 years: 94.6% distant metastasis free interval for patients treated with chemotherapy  Treatment  summary: 1.Adjuvant chemotherapy with dose dense Adriamycin and Cytoxan x4 followed by Taxol weekly x6 (stopped for neuropathy)07/23/2017 to 11/10/2017 Herceptin adjuvant treatment started 12/29/2017 2.followed by radiation [place]:Completed 02/22/2018 3.Followed by adjuvant antiestrogen therapy with Anastrozole '1mg'$   daily x5 to 7 years ----------------------------------------------------------------------- Repeat assessment revealed HER-2 negative focus but also HER-2 positivefocus making her tumor heterogeneous.  Current treatment: Herceptinmaintenance therapy,anastrozole started 03/23/2018 She will complete Herceptin by 12/21/2018  Herceptin toxicities: Tolerating well.  Letrozole toxicities:  Hot flashes Itching of skin It also that the patient was taking both anastrozole and letrozole.  She has discontinued anastrozole and the jaw pain has resolved.  She is hoping that the skin itching and the hot flashes will also improve with this.  She saw her radiation oncologist who ordered a brain MRI and a mammogram on the right breast.  Apparently both of these were normal. She had a CT of the chest done for lung nodules which are stable.  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  She has been trying to massage herself.  But Bumex has been helping her.  I renewed her prescription for Bumex.  Chemo-induced peripheral neuropathy: I increase the dosage of Lyrica because the symptoms are continuing to persist. We will continue discussions about neratinib after the conclusion of Herceptin.  Patient husband is going through [place] treatment and rates well.  She is trying to get another opinion at [facility]. Return to clinic every 3 weeks for Herceptin every 6 weeks of follow-up with me.    No orders of the defined types were placed in this encounter.  The patient has a good understanding of the overall plan. she agrees with it. she will call with any  problems that may develop before the next visit here.  Nicholas Lose, MD 10/19/2018  Julious Oka Dorshimer am acting as scribe for Dr. Nicholas Lose.  I have reviewed the above documentation for accuracy and completeness, and I agree with the above.

## 2018-10-19 ENCOUNTER — Inpatient Hospital Stay: Payer: Medicare Other

## 2018-10-19 ENCOUNTER — Inpatient Hospital Stay: Payer: Medicare Other | Attending: Hematology and Oncology

## 2018-10-19 ENCOUNTER — Inpatient Hospital Stay (HOSPITAL_BASED_OUTPATIENT_CLINIC_OR_DEPARTMENT_OTHER): Payer: Medicare Other | Admitting: Hematology and Oncology

## 2018-10-19 ENCOUNTER — Other Ambulatory Visit: Payer: Self-pay

## 2018-10-19 ENCOUNTER — Other Ambulatory Visit: Payer: Self-pay | Admitting: *Deleted

## 2018-10-19 DIAGNOSIS — Z5181 Encounter for therapeutic drug level monitoring: Secondary | ICD-10-CM

## 2018-10-19 DIAGNOSIS — C50412 Malignant neoplasm of upper-outer quadrant of left female breast: Secondary | ICD-10-CM | POA: Diagnosis present

## 2018-10-19 DIAGNOSIS — Z170 Estrogen receptor positive status [ER+]: Secondary | ICD-10-CM

## 2018-10-19 DIAGNOSIS — Z79899 Other long term (current) drug therapy: Secondary | ICD-10-CM | POA: Diagnosis not present

## 2018-10-19 DIAGNOSIS — Z5112 Encounter for antineoplastic immunotherapy: Secondary | ICD-10-CM | POA: Diagnosis present

## 2018-10-19 DIAGNOSIS — T451X5A Adverse effect of antineoplastic and immunosuppressive drugs, initial encounter: Secondary | ICD-10-CM | POA: Insufficient documentation

## 2018-10-19 DIAGNOSIS — Z923 Personal history of irradiation: Secondary | ICD-10-CM | POA: Diagnosis not present

## 2018-10-19 DIAGNOSIS — G620 Drug-induced polyneuropathy: Secondary | ICD-10-CM | POA: Diagnosis not present

## 2018-10-19 DIAGNOSIS — I890 Lymphedema, not elsewhere classified: Secondary | ICD-10-CM | POA: Diagnosis not present

## 2018-10-19 DIAGNOSIS — Z95828 Presence of other vascular implants and grafts: Secondary | ICD-10-CM

## 2018-10-19 LAB — CMP (CANCER CENTER ONLY)
ALT: 34 U/L (ref 0–44)
AST: 33 U/L (ref 15–41)
Albumin: 3.6 g/dL (ref 3.5–5.0)
Alkaline Phosphatase: 93 U/L (ref 38–126)
Anion gap: 10 (ref 5–15)
BUN: 12 mg/dL (ref 8–23)
CO2: 28 mmol/L (ref 22–32)
Calcium: 9.2 mg/dL (ref 8.9–10.3)
Chloride: 103 mmol/L (ref 98–111)
Creatinine: 0.83 mg/dL (ref 0.44–1.00)
GFR, Est AFR Am: >60 mL/min (ref >60)
GFR, Estimated: >60 mL/min (ref >60)
Glucose, Bld: 188 mg/dL — ABNORMAL HIGH (ref 70–99)
Potassium: 3.6 mmol/L (ref 3.5–5.1)
Sodium: 141 mmol/L (ref 135–145)
Total Bilirubin: 0.4 mg/dL (ref 0.3–1.2)
Total Protein: 6.5 g/dL (ref 6.5–8.1)

## 2018-10-19 LAB — CBC WITH DIFFERENTIAL (CANCER CENTER ONLY)
Abs Immature Granulocytes: 0.01 10*3/uL (ref 0.00–0.07)
Basophils Absolute: 0 10*3/uL (ref 0.0–0.1)
Basophils Relative: 1 %
Eosinophils Absolute: 0.1 10*3/uL (ref 0.0–0.5)
Eosinophils Relative: 2 %
HCT: 37.6 % (ref 36.0–46.0)
Hemoglobin: 12.6 g/dL (ref 12.0–15.0)
Immature Granulocytes: 0 %
Lymphocytes Relative: 27 %
Lymphs Abs: 0.8 10*3/uL (ref 0.7–4.0)
MCH: 32.6 pg (ref 26.0–34.0)
MCHC: 33.5 g/dL (ref 30.0–36.0)
MCV: 97.2 fL (ref 80.0–100.0)
Monocytes Absolute: 0.4 10*3/uL (ref 0.1–1.0)
Monocytes Relative: 13 %
Neutro Abs: 1.6 10*3/uL — ABNORMAL LOW (ref 1.7–7.7)
Neutrophils Relative %: 57 %
Platelet Count: 161 10*3/uL (ref 150–400)
RBC: 3.87 MIL/uL (ref 3.87–5.11)
RDW: 13 % (ref 11.5–15.5)
WBC Count: 2.8 10*3/uL — ABNORMAL LOW (ref 4.0–10.5)
nRBC: 0 % (ref 0.0–0.2)

## 2018-10-19 MED ORDER — TRASTUZUMAB CHEMO 150 MG IV SOLR
600.0000 mg | Freq: Once | INTRAVENOUS | Status: AC
  Administered 2018-10-19: 600 mg via INTRAVENOUS
  Filled 2018-10-19: qty 28.57

## 2018-10-19 MED ORDER — SODIUM CHLORIDE 0.9% FLUSH
10.0000 mL | INTRAVENOUS | Status: DC | PRN
  Administered 2018-10-19: 10 mL
  Filled 2018-10-19: qty 10

## 2018-10-19 MED ORDER — DIPHENHYDRAMINE HCL 25 MG PO CAPS
25.0000 mg | ORAL_CAPSULE | Freq: Once | ORAL | Status: AC
  Administered 2018-10-19: 13:00:00 25 mg via ORAL

## 2018-10-19 MED ORDER — METHYLPREDNISOLONE SODIUM SUCC 125 MG IJ SOLR
INTRAMUSCULAR | Status: AC
  Filled 2018-10-19: qty 2

## 2018-10-19 MED ORDER — SODIUM CHLORIDE 0.9 % IV SOLN
Freq: Once | INTRAVENOUS | Status: AC
  Administered 2018-10-19: 13:00:00 via INTRAVENOUS
  Filled 2018-10-19: qty 250

## 2018-10-19 MED ORDER — ACETAMINOPHEN 325 MG PO TABS
650.0000 mg | ORAL_TABLET | Freq: Once | ORAL | Status: AC
  Administered 2018-10-19: 650 mg via ORAL

## 2018-10-19 MED ORDER — METHYLPREDNISOLONE SODIUM SUCC 125 MG IJ SOLR
60.0000 mg | Freq: Once | INTRAMUSCULAR | Status: AC
  Administered 2018-10-19: 60 mg via INTRAVENOUS

## 2018-10-19 MED ORDER — FAMOTIDINE IN NACL 20-0.9 MG/50ML-% IV SOLN
20.0000 mg | Freq: Once | INTRAVENOUS | Status: AC
  Administered 2018-10-19: 13:00:00 20 mg via INTRAVENOUS

## 2018-10-19 MED ORDER — HEPARIN SOD (PORK) LOCK FLUSH 100 UNIT/ML IV SOLN
500.0000 [IU] | Freq: Once | INTRAVENOUS | Status: AC | PRN
  Administered 2018-10-19: 500 [IU]
  Filled 2018-10-19: qty 5

## 2018-10-19 MED ORDER — DIPHENHYDRAMINE HCL 25 MG PO CAPS
ORAL_CAPSULE | ORAL | Status: AC
  Filled 2018-10-19: qty 1

## 2018-10-19 MED ORDER — ACETAMINOPHEN 325 MG PO TABS
ORAL_TABLET | ORAL | Status: AC
  Filled 2018-10-19: qty 2

## 2018-10-19 MED ORDER — FAMOTIDINE IN NACL 20-0.9 MG/50ML-% IV SOLN
INTRAVENOUS | Status: AC
  Filled 2018-10-19: qty 50

## 2018-10-19 NOTE — Patient Instructions (Signed)
[facility] Discharge Instructions for Patients Receiving Chemotherapy  Today you received the following chemotherapy agents Herceptin  To help prevent nausea and vomiting after your treatment, we encourage you to take your nausea medication as directed   If you develop nausea and vomiting that is not controlled by your nausea medication, call the clinic.   BELOW ARE SYMPTOMS THAT SHOULD BE REPORTED IMMEDIATELY:  *FEVER GREATER THAN 100.5 F  *CHILLS WITH OR WITHOUT FEVER  NAUSEA AND VOMITING THAT IS NOT CONTROLLED WITH YOUR NAUSEA MEDICATION  *UNUSUAL SHORTNESS OF BREATH  *UNUSUAL BRUISING OR BLEEDING  TENDERNESS IN MOUTH AND THROAT WITH OR WITHOUT PRESENCE OF ULCERS  *URINARY PROBLEMS  *BOWEL PROBLEMS  UNUSUAL RASH Items with * indicate a potential emergency and should be followed up as soon as possible.  Feel free to call the clinic should you have any questions or concerns. The clinic phone number is [phone].  Please show the CHEMO ALERT CARD at check-in to the Emergency Department and triage nurse.   

## 2018-10-20 ENCOUNTER — Telehealth: Payer: Self-pay | Admitting: Hematology and Oncology

## 2018-10-20 NOTE — Telephone Encounter (Signed)
I talk with patient regarding schedule  

## 2018-10-25 ENCOUNTER — Other Ambulatory Visit: Payer: Self-pay

## 2018-10-25 ENCOUNTER — Ambulatory Visit (HOSPITAL_COMMUNITY)
Admission: RE | Admit: 2018-10-25 | Discharge: 2018-10-25 | Disposition: A | Discharge status: Home / Self Care | Payer: Medicare Other | Source: Ambulatory Visit | Attending: Hematology and Oncology | Admitting: Hematology and Oncology

## 2018-10-25 DIAGNOSIS — Z5181 Encounter for therapeutic drug level monitoring: Secondary | ICD-10-CM | POA: Diagnosis not present

## 2018-10-25 DIAGNOSIS — Z79899 Other long term (current) drug therapy: Secondary | ICD-10-CM | POA: Diagnosis not present

## 2018-10-25 DIAGNOSIS — I119 Hypertensive heart disease without heart failure: Secondary | ICD-10-CM | POA: Diagnosis not present

## 2018-10-25 DIAGNOSIS — I358 Other nonrheumatic aortic valve disorders: Secondary | ICD-10-CM | POA: Insufficient documentation

## 2018-10-25 DIAGNOSIS — Z170 Estrogen receptor positive status [ER+]: Secondary | ICD-10-CM

## 2018-10-25 DIAGNOSIS — C50412 Malignant neoplasm of upper-outer quadrant of left female breast: Secondary | ICD-10-CM | POA: Diagnosis not present

## 2018-10-25 NOTE — Progress Notes (Signed)
  Echocardiogram 2D Echocardiogram has been performed.  Breanna [name] 10/25/2018, 10:45 AM

## 2018-11-01 ENCOUNTER — Other Ambulatory Visit: Payer: Self-pay | Admitting: Hematology and Oncology

## 2018-11-09 ENCOUNTER — Other Ambulatory Visit: Payer: Self-pay

## 2018-11-09 ENCOUNTER — Inpatient Hospital Stay: Payer: Medicare Other | Attending: Hematology and Oncology

## 2018-11-09 VITALS — BP 150/76 | HR 70 | Temp 98.7°F | Resp 18

## 2018-11-09 DIAGNOSIS — Z5112 Encounter for antineoplastic immunotherapy: Secondary | ICD-10-CM | POA: Diagnosis not present

## 2018-11-09 DIAGNOSIS — G620 Drug-induced polyneuropathy: Secondary | ICD-10-CM | POA: Insufficient documentation

## 2018-11-09 DIAGNOSIS — C50412 Malignant neoplasm of upper-outer quadrant of left female breast: Secondary | ICD-10-CM | POA: Insufficient documentation

## 2018-11-09 DIAGNOSIS — Z923 Personal history of irradiation: Secondary | ICD-10-CM | POA: Diagnosis not present

## 2018-11-09 DIAGNOSIS — Z9012 Acquired absence of left breast and nipple: Secondary | ICD-10-CM | POA: Diagnosis not present

## 2018-11-09 DIAGNOSIS — I890 Lymphedema, not elsewhere classified: Secondary | ICD-10-CM | POA: Diagnosis not present

## 2018-11-09 DIAGNOSIS — T451X5A Adverse effect of antineoplastic and immunosuppressive drugs, initial encounter: Secondary | ICD-10-CM | POA: Diagnosis not present

## 2018-11-09 DIAGNOSIS — Z79899 Other long term (current) drug therapy: Secondary | ICD-10-CM | POA: Insufficient documentation

## 2018-11-09 DIAGNOSIS — Z170 Estrogen receptor positive status [ER+]: Secondary | ICD-10-CM | POA: Insufficient documentation

## 2018-11-09 MED ORDER — DIPHENHYDRAMINE HCL 25 MG PO CAPS
25.0000 mg | ORAL_CAPSULE | Freq: Once | ORAL | Status: AC
  Administered 2018-11-09: 15:00:00 25 mg via ORAL

## 2018-11-09 MED ORDER — METHYLPREDNISOLONE SODIUM SUCC 125 MG IJ SOLR
60.0000 mg | Freq: Once | INTRAMUSCULAR | Status: AC
  Administered 2018-11-09: 60 mg via INTRAVENOUS

## 2018-11-09 MED ORDER — HEPARIN SOD (PORK) LOCK FLUSH 100 UNIT/ML IV SOLN
500.0000 [IU] | Freq: Once | INTRAVENOUS | Status: AC | PRN
  Administered 2018-11-09: 17:00:00 500 [IU]
  Filled 2018-11-09: qty 5

## 2018-11-09 MED ORDER — SODIUM CHLORIDE 0.9% FLUSH
10.0000 mL | INTRAVENOUS | Status: DC | PRN
  Administered 2018-11-09: 17:00:00 10 mL
  Filled 2018-11-09: qty 10

## 2018-11-09 MED ORDER — FAMOTIDINE IN NACL 20-0.9 MG/50ML-% IV SOLN
20.0000 mg | Freq: Once | INTRAVENOUS | Status: AC
  Administered 2018-11-09: 15:00:00 20 mg via INTRAVENOUS

## 2018-11-09 MED ORDER — DIPHENHYDRAMINE HCL 25 MG PO CAPS
ORAL_CAPSULE | ORAL | Status: AC
  Filled 2018-11-09: qty 1

## 2018-11-09 MED ORDER — ACETAMINOPHEN 325 MG PO TABS: 650.0000 mg | ORAL_TABLET | Freq: Once | ORAL | Status: DC

## 2018-11-09 MED ORDER — METHYLPREDNISOLONE SODIUM SUCC 125 MG IJ SOLR
INTRAMUSCULAR | Status: AC
  Filled 2018-11-09: qty 2

## 2018-11-09 MED ORDER — FAMOTIDINE IN NACL 20-0.9 MG/50ML-% IV SOLN
INTRAVENOUS | Status: AC
  Filled 2018-11-09: qty 50

## 2018-11-09 MED ORDER — TRASTUZUMAB CHEMO 150 MG IV SOLR
600.0000 mg | Freq: Once | INTRAVENOUS | Status: AC
  Administered 2018-11-09: 16:00:00 600 mg via INTRAVENOUS
  Filled 2018-11-09: qty 28.57

## 2018-11-09 MED ORDER — SODIUM CHLORIDE 0.9 % IV SOLN
Freq: Once | INTRAVENOUS | Status: AC
  Administered 2018-11-09: 15:00:00 via INTRAVENOUS
  Filled 2018-11-09: qty 250

## 2018-11-09 NOTE — Patient Instructions (Signed)
[facility] Discharge Instructions for Patients Receiving Chemotherapy  Today you received the following chemotherapy agents Herceptin  To help prevent nausea and vomiting after your treatment, we encourage you to take your nausea medication as directed   If you develop nausea and vomiting that is not controlled by your nausea medication, call the clinic.   BELOW ARE SYMPTOMS THAT SHOULD BE REPORTED IMMEDIATELY:  *FEVER GREATER THAN 100.5 F  *CHILLS WITH OR WITHOUT FEVER  NAUSEA AND VOMITING THAT IS NOT CONTROLLED WITH YOUR NAUSEA MEDICATION  *UNUSUAL SHORTNESS OF BREATH  *UNUSUAL BRUISING OR BLEEDING  TENDERNESS IN MOUTH AND THROAT WITH OR WITHOUT PRESENCE OF ULCERS  *URINARY PROBLEMS  *BOWEL PROBLEMS  UNUSUAL RASH Items with * indicate a potential emergency and should be followed up as soon as possible.  Feel free to call the clinic should you have any questions or concerns. The clinic phone number is [phone].  Please show the CHEMO ALERT CARD at check-in to the Emergency Department and triage nurse.   

## 2018-11-29 NOTE — Progress Notes (Signed)
Patient Care Team: [name], MD as PCP - General (Family Medicine)  DIAGNOSIS:    ICD-10-CM   1. Malignant neoplasm of upper-outer quadrant of left breast in female, estrogen receptor positive ([place])  C50.412    Z17.0     SUMMARY OF ONCOLOGIC HISTORY: Oncology History  Malignant neoplasm of upper-outer quadrant of left breast in female, estrogen receptor positive ([place])  05/13/2017 Initial Diagnosis   [place] biopsy: Left breast biopsy 2 o'clock position 8 cm from nipple ultrasound-guided biopsy: Grade 2 IDC, ER 50%, PR 3%, HER-2 equivocal Ki-67 15% nipple area biopsy grade 2 IDC, ER 100% positive, PR negative, HER-2 equivocal, Ki-67 40%   05/31/2017 Breast MRI   Left breast UOQ 230 position: 2 x 1.9 x 1.9 cm mass, second mass left breast UOQ 230 position: 1 x 1 x 0.7 cm, 2 more enhancing adjacent 3 and 5 mm nodules slightly superiorly midway between the 2 masses.  No abnormal lymph nodes, right breast normal   06/11/2017 Surgery   Left Mastectomy: 2 foci of IDC 2.2 and 1 cm Grade 2, 1/2 LN Positive; Grade 2 IDC, ER 50%, PR 3%, HER-2 equivocal Ki-67 15% nipple area biopsy grade 2 IDC, ER 100% positive, PR negative, HER-2 equivocal, Ki-67 40% T2N1a Stage 1B   06/25/2017 Miscellaneous   Mammaprint high risk luminal type B   07/23/2017 - 11/10/2017 Chemotherapy   Adjuvant chemotherapy with dose dense Adriamycin and Cytoxan x4 followed by Taxol weekly x6   12/10/2017 Pathology Results   Repeat analysis of pathology confirmed that patient has heterogeneous HER-2 signaling.  There is equivocal IHC 2+ and fish negative areas where the ratio was 1.49 and copy number of 2.9.  There is one area that is HER-2 +3+ by [facility]   12/28/2017 - 02/22/2018 Radiation Therapy   Adj XRT at [facility]   12/29/2017 -  Chemotherapy   The patient had trastuzumab (HERCEPTIN) 798 mg in sodium chloride 0.9 % 250 mL chemo infusion, 8 mg/kg = 798 mg, Intravenous,  Once, 15 of 17 cycles Administration: 798  mg (12/29/2017), 588 mg (01/19/2018), 600 mg (03/23/2018), 600 mg (05/04/2018), 600 mg (05/26/2018), 600 mg (06/14/2018), 600 mg (07/06/2018), 600 mg (07/27/2018), 600 mg (08/17/2018), 600 mg (09/07/2018), 600 mg (09/28/2018), 600 mg (10/19/2018), 600 mg (11/09/2018), 600 mg (02/09/2018), 600 mg (03/03/2018)  for chemotherapy treatment.    03/23/2018 -  Anti-estrogen oral therapy   Anastrozole, 50m daily stopped due to itching; switched to letrozole 06/2018     CHIEF COMPLIANT: Follow-up of anastrozole and Herceptin  INTERVAL HISTORY: Breanna BORNHORSTis a 73y.o. with above-mentioned history of HER-2 positive breast cancer treated with left mastectomy, adjuvant chemotherapy, radiation, and who is currently on Herceptin maintenance with anastrozole.Echo on 10/25/18 showed an ejection fraction of 60-65%. She presents to the clinic todayfor treatment.   REVIEW OF SYSTEMS:   Constitutional: Denies fevers, chills or abnormal weight loss Eyes: Denies blurriness of vision Ears, nose, mouth, throat, and face: Denies mucositis or sore throat Respiratory: Denies cough, dyspnea or wheezes Cardiovascular: Denies palpitation, chest discomfort Gastrointestinal: Denies nausea, heartburn or change in bowel habits Skin: Denies abnormal skin rashes Lymphatics: Denies new lymphadenopathy or easy bruising Neurological: Denies numbness, tingling or new weaknesses Behavioral/Psych: Mood is stable, no new changes  Extremities: No lower extremity edema Breast: denies any pain or lumps or nodules in either breasts All other systems were reviewed with the patient and are negative.  I have reviewed the past medical history, past surgical  history, social history and family history with the patient and they are unchanged from previous note.  ALLERGIES:  is allergic to atorvastatin; vancomycin; ancef [cefazolin]; lactose; nasonex [mometasone furoate]; and neurontin [gabapentin].  MEDICATIONS:  Current Outpatient Medications   Medication Sig Dispense Refill  . acetaminophen (TYLENOL) 500 MG tablet Take 1,000 mg by mouth 3 (three) times daily as needed for headache (pain).     . amLODipine (NORVASC) 5 MG tablet Take 5 mg by mouth daily.     . atorvastatin (LIPITOR) 20 MG tablet Take 20 mg by mouth at bedtime.    . B Complex-C (SUPER B COMPLEX PO) Take 1 tablet by mouth daily.    . bumetanide (BUMEX) 1 MG tablet Take 1 tablet (1 mg total) by mouth 2 (two) times daily. 60 tablet 3  . Calcium Carb-Cholecalciferol (CALCIUM 600-D PO) Take 1 tablet by mouth at bedtime.    . carvedilol (COREG) 25 MG tablet Take 1 tablet (25 mg total) by mouth 2 (two) times daily with a meal. 60 tablet 0  . cetirizine (ZYRTEC) 10 MG tablet Take 10 mg by mouth at bedtime.     . clopidogrel (PLAVIX) 75 MG tablet Take 1 tablet (75 mg total) by mouth every evening. (Patient taking differently: Take 75 mg by mouth daily. ) 30 tablet 0  . diclofenac sodium (VOLTAREN) 1 % GEL Apply 4 g topically at bedtime. 100 g 6  . diphenoxylate-atropine (LOMOTIL) 2.5-0.025 MG tablet Take 1 tablet by mouth 4 (four) times daily as needed for diarrhea or loose stools. 30 tablet 0  . ferrous sulfate 325 (65 FE) MG tablet Take 325 mg by mouth daily with breakfast.    . ipratropium (ATROVENT) 0.03 % nasal spray Place 2 sprays into both nostrils every 8 (eight) hours.    . lansoprazole (PREVACID) 15 MG capsule Take 15 mg by mouth daily.     . letrozole (FEMARA) 2.5 MG tablet Take 1 tablet by mouth once daily 90 tablet 1  . lidocaine-prilocaine (EMLA) cream Apply 1 application topically as needed. 30 g 0  . lisinopril (PRINIVIL,ZESTRIL) 40 MG tablet Take 40 mg by mouth daily.    . miconazole (MICATIN) 2 % cream Apply 1 application topically 2 (two) times daily. 28.35 g 3  . PARoxetine (PAXIL) 10 MG tablet Take 10 mg by mouth at bedtime.     . potassium chloride (K-DUR,KLOR-CON) 10 MEQ tablet Take 10 mEq by mouth at bedtime.     . pregabalin (LYRICA) 75 MG capsule Take 1  capsule (75 mg total) by mouth 2 (two) times daily. 90 capsule 3  . traMADol (ULTRAM) 50 MG tablet TAKE 1 TABLET BY MOUTH EVERY 6 HOURS AS NEEDED FOR MILD PAIN 60 tablet 0   No current facility-administered medications for this visit.     PHYSICAL EXAMINATION: ECOG PERFORMANCE STATUS: 1 - Symptomatic but completely ambulatory  Vitals:   11/30/18 1421  BP: (!) 145/75  Pulse: 60  Resp: 17  Temp: 97.8 F (36.6 C)  SpO2: 96%   Filed Weights   11/30/18 1421  Weight: 219 lb 14.4 oz (99.7 kg)    GENERAL: alert, no distress and comfortable SKIN: skin color, texture, turgor are normal, no rashes or significant lesions EYES: normal, Conjunctiva are pink and non-injected, sclera clear OROPHARYNX: no exudate, no erythema and lips, buccal mucosa, and tongue normal  NECK: supple, thyroid normal size, non-tender, without nodularity LYMPH: no palpable lymphadenopathy in the cervical, axillary or inguinal LUNGS: clear to auscultation   and percussion with normal breathing effort HEART: regular rate & rhythm and no murmurs and no lower extremity edema ABDOMEN: abdomen soft, non-tender and normal bowel sounds MUSCULOSKELETAL: no cyanosis of digits and no clubbing  NEURO: alert & oriented x 3 with fluent speech, no focal motor/sensory deficits EXTREMITIES: No lower extremity edema  LABORATORY DATA:  I have reviewed the data as listed CMP Latest Ref Rng & Units 11/30/2018 10/19/2018 09/07/2018  Glucose 70 - 99 mg/dL 123(H) 188(H) 142(H)  BUN 8 - 23 mg/dL 12 12 12  Creatinine 0.44 - 1.00 mg/dL 0.87 0.83 0.85  Sodium 135 - 145 mmol/L 144 141 141  Potassium 3.5 - 5.1 mmol/L 3.9 3.6 3.7  Chloride 98 - 111 mmol/L 104 103 99  CO2 22 - 32 mmol/L 30 28 32  Calcium 8.9 - 10.3 mg/dL 9.0 9.2 8.9  Total Protein 6.5 - 8.1 g/dL 6.6 6.5 6.6  Total Bilirubin 0.3 - 1.2 mg/dL 0.3 0.4 0.4  Alkaline Phos 38 - 126 U/L 116 93 100  AST 15 - 41 U/L 34 33 34  ALT 0 - 44 U/L 33 34 31    Lab Results  Component  Value Date   WBC 3.5 (L) 11/30/2018   HGB 13.2 11/30/2018   HCT 38.9 11/30/2018   MCV 95.8 11/30/2018   PLT 159 11/30/2018   NEUTROABS 2.0 11/30/2018    ASSESSMENT & PLAN:  Malignant neoplasm of upper-outer quadrant of left breast in female, estrogen receptor positive ([facility]) /7/19: [place] biopsy: Left breast biopsy 2 o'clock position 8 cm from nipple ultrasound-guided biopsy: Grade 2 IDC, ER 50%, PR 3%, HER-2 equivocal Ki-67 15% nipple area biopsy grade 2 IDC, ER 100% positive, PR negative, HER-2 equivocal, Ki-67 40%  06/11/17:Left Mastectomy: 2 foci of IDC 2.2 and 1 cm Grade 2, 1/2 LN Positive; Grade 2 IDC, ER 50%, PR 3%, HER-2 equivocal Ki-67 15% nipple area biopsy grade 2 IDC, ER 100% positive, PR negative, HER-2 equivocal, Ki-67 40% T2N1a Stage 1B I discussed with her that given the fact that she has lymph node positive disease, this is clinically high risk disease.  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, potential benefit of treatment at 5 years: 94.6% distant metastasis free interval for patients treated with chemotherapy  Treatment summary: 1.Adjuvant chemotherapy with dose dense Adriamycin and Cytoxan x4 followed by Taxol weekly x6 (stopped for neuropathy)07/23/2017 to 11/10/2017 Herceptin adjuvant treatment started 12/29/2017 2.followed by radiation [place]:Completed 02/22/2018 3.Followed by adjuvant antiestrogen therapy with Anastrozole 1mg daily x5 to 7 years ----------------------------------------------------------------------- Repeat assessment revealed HER-2 negative focus but also HER-2 positivefocus making her tumor heterogeneous.  Current treatment: Herceptinmaintenance therapy,anastrozole started 03/23/2018 She will complete Herceptin by 12/21/2018  Herceptin toxicities: Tolerating well.  Letrozole toxicities: Hot flashes: Instructed to take letrozole every other day Itching of skin  She saw her radiation oncologist who ordered a brain  MRI and a mammogram on the right breast.  Apparently both of these were normal. She had a CT of the chest done for lung nodules which are stable.  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 But Bumex has been helping her. I renewed her prescription for Bumex.  Chemo-induced peripheral neuropathy: I increased the dosage of Lyrica because the symptoms are continuing to persist. We will continue discussions about neratinib after the conclusion of Herceptin.  Patient husband is going through [facility] treatment in [place].  She is trying to get another opinion at   [facility]. Return to clinic every 3 weeks for Herceptin and her last treatment is in 3 weeks.    No orders of the defined types were placed in this encounter.  The patient has a good understanding of the overall plan. she agrees with it. she will call with any problems that may develop before the next visit here.  Gudena, Vinay, MD 11/30/2018  I, Molly Dorshimer am acting as scribe for Dr. Vinay Gudena.  I have reviewed the above documentation for accuracy and completeness, and I agree with the above.       

## 2018-11-30 ENCOUNTER — Inpatient Hospital Stay: Payer: Medicare Other

## 2018-11-30 ENCOUNTER — Other Ambulatory Visit: Payer: Self-pay

## 2018-11-30 ENCOUNTER — Inpatient Hospital Stay (HOSPITAL_BASED_OUTPATIENT_CLINIC_OR_DEPARTMENT_OTHER): Payer: Medicare Other | Admitting: Hematology and Oncology

## 2018-11-30 DIAGNOSIS — C50412 Malignant neoplasm of upper-outer quadrant of left female breast: Secondary | ICD-10-CM

## 2018-11-30 DIAGNOSIS — Z170 Estrogen receptor positive status [ER+]: Secondary | ICD-10-CM

## 2018-11-30 DIAGNOSIS — Z5112 Encounter for antineoplastic immunotherapy: Secondary | ICD-10-CM | POA: Diagnosis not present

## 2018-11-30 DIAGNOSIS — Z95828 Presence of other vascular implants and grafts: Secondary | ICD-10-CM

## 2018-11-30 LAB — CBC WITH DIFFERENTIAL (CANCER CENTER ONLY)
Abs Immature Granulocytes: 0.01 10*3/uL (ref 0.00–0.07)
Basophils Absolute: 0 10*3/uL (ref 0.0–0.1)
Basophils Relative: 1 %
Eosinophils Absolute: 0 10*3/uL (ref 0.0–0.5)
Eosinophils Relative: 1 %
HCT: 38.9 % (ref 36.0–46.0)
Hemoglobin: 13.2 g/dL (ref 12.0–15.0)
Immature Granulocytes: 0 %
Lymphocytes Relative: 28 %
Lymphs Abs: 1 10*3/uL (ref 0.7–4.0)
MCH: 32.5 pg (ref 26.0–34.0)
MCHC: 33.9 g/dL (ref 30.0–36.0)
MCV: 95.8 fL (ref 80.0–100.0)
Monocytes Absolute: 0.5 10*3/uL (ref 0.1–1.0)
Monocytes Relative: 13 %
Neutro Abs: 2 10*3/uL (ref 1.7–7.7)
Neutrophils Relative %: 57 %
Platelet Count: 159 10*3/uL (ref 150–400)
RBC: 4.06 MIL/uL (ref 3.87–5.11)
RDW: 12.6 % (ref 11.5–15.5)
WBC Count: 3.5 10*3/uL — ABNORMAL LOW (ref 4.0–10.5)
nRBC: 0 % (ref 0.0–0.2)

## 2018-11-30 LAB — CMP (CANCER CENTER ONLY)
ALT: 33 U/L (ref 0–44)
AST: 34 U/L (ref 15–41)
Albumin: 3.8 g/dL (ref 3.5–5.0)
Alkaline Phosphatase: 116 U/L (ref 38–126)
Anion gap: 10 (ref 5–15)
BUN: 12 mg/dL (ref 8–23)
CO2: 30 mmol/L (ref 22–32)
Calcium: 9 mg/dL (ref 8.9–10.3)
Chloride: 104 mmol/L (ref 98–111)
Creatinine: 0.87 mg/dL (ref 0.44–1.00)
GFR, Est AFR Am: >60 mL/min (ref >60)
GFR, Estimated: >60 mL/min (ref >60)
Glucose, Bld: 123 mg/dL — ABNORMAL HIGH (ref 70–99)
Potassium: 3.9 mmol/L (ref 3.5–5.1)
Sodium: 144 mmol/L (ref 135–145)
Total Bilirubin: 0.3 mg/dL (ref 0.3–1.2)
Total Protein: 6.6 g/dL (ref 6.5–8.1)

## 2018-11-30 MED ORDER — DIPHENHYDRAMINE HCL 25 MG PO CAPS
25.0000 mg | ORAL_CAPSULE | Freq: Once | ORAL | Status: AC
  Administered 2018-11-30: 25 mg via ORAL

## 2018-11-30 MED ORDER — METHYLPREDNISOLONE SODIUM SUCC 125 MG IJ SOLR
INTRAMUSCULAR | Status: AC
  Filled 2018-11-30: qty 2

## 2018-11-30 MED ORDER — MICONAZOLE NITRATE 2 % POWD: 1.0000 "application " | Freq: Two times a day (BID) | 6 refills | Status: DC

## 2018-11-30 MED ORDER — TRASTUZUMAB CHEMO 150 MG IV SOLR
600.0000 mg | Freq: Once | INTRAVENOUS | Status: AC
  Administered 2018-11-30: 16:00:00 600 mg via INTRAVENOUS
  Filled 2018-11-30: qty 28.57

## 2018-11-30 MED ORDER — METHYLPREDNISOLONE SODIUM SUCC 125 MG IJ SOLR
60.0000 mg | Freq: Once | INTRAMUSCULAR | Status: AC
  Administered 2018-11-30: 60 mg via INTRAVENOUS

## 2018-11-30 MED ORDER — SODIUM CHLORIDE 0.9% FLUSH
10.0000 mL | INTRAVENOUS | Status: DC | PRN
  Administered 2018-11-30: 14:00:00 10 mL
  Filled 2018-11-30: qty 10

## 2018-11-30 MED ORDER — ACETAMINOPHEN 325 MG PO TABS
650.0000 mg | ORAL_TABLET | Freq: Once | ORAL | Status: AC
  Administered 2018-11-30: 650 mg via ORAL

## 2018-11-30 MED ORDER — HEPARIN SOD (PORK) LOCK FLUSH 100 UNIT/ML IV SOLN
500.0000 [IU] | Freq: Once | INTRAVENOUS | Status: AC | PRN
  Administered 2018-11-30: 500 [IU]
  Filled 2018-11-30: qty 5

## 2018-11-30 MED ORDER — SODIUM CHLORIDE 0.9 % IV SOLN
Freq: Once | INTRAVENOUS | Status: AC
  Administered 2018-11-30: 15:00:00 via INTRAVENOUS
  Filled 2018-11-30: qty 250

## 2018-11-30 MED ORDER — ACETAMINOPHEN 325 MG PO TABS
ORAL_TABLET | ORAL | Status: AC
  Filled 2018-11-30: qty 2

## 2018-11-30 MED ORDER — FAMOTIDINE IN NACL 20-0.9 MG/50ML-% IV SOLN
INTRAVENOUS | Status: AC
  Filled 2018-11-30: qty 50

## 2018-11-30 MED ORDER — DIPHENHYDRAMINE HCL 25 MG PO CAPS
ORAL_CAPSULE | ORAL | Status: AC
  Filled 2018-11-30: qty 1

## 2018-11-30 MED ORDER — FAMOTIDINE IN NACL 20-0.9 MG/50ML-% IV SOLN
20.0000 mg | Freq: Once | INTRAVENOUS | Status: AC
  Administered 2018-11-30: 20 mg via INTRAVENOUS

## 2018-11-30 MED ORDER — SODIUM CHLORIDE 0.9% FLUSH
10.0000 mL | INTRAVENOUS | Status: DC | PRN
  Administered 2018-11-30: 10 mL
  Filled 2018-11-30: qty 10

## 2018-11-30 NOTE — Assessment & Plan Note (Signed)
/  [name] biopsy: Left breast biopsy 2 o'clock position 8 cm from nipple ultrasound-guided biopsy: Grade 2 IDC, ER 50%, PR 3%, HER-2 equivocal Ki-67 15% nipple area biopsy grade 2 IDC, ER 100% positive, PR negative, HER-2 equivocal, Ki-67 40%  06/11/17:Left Mastectomy: 2 foci of IDC 2.2 and 1 cm Grade 2, 1/2 LN Positive; Grade 2 IDC, ER 50%, PR 3%, HER-2 equivocal Ki-67 15% nipple area biopsy grade 2 IDC, ER 100% positive, PR negative, HER-2 equivocal, Ki-67 40% T2N1a Stage 1B I discussed with her that given the fact that she has lymph node positive disease, this is clinically high risk disease.  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, potential benefit of treatment at 5 years: 94.6% distant metastasis free interval for patients treated with chemotherapy  Treatment summary: 1.Adjuvant chemotherapy with dose dense Adriamycin and Cytoxan x4 followed by Taxol weekly x6 (stopped for neuropathy)07/23/2017 to 11/10/2017 Herceptin adjuvant treatment started 12/29/2017 2.followed by radiation [place]:Completed 02/22/2018 3.Followed by adjuvant antiestrogen therapy with Anastrozole '1mg'$  daily x5 to 7 years ----------------------------------------------------------------------- Repeat assessment revealed HER-2 negative focus but also HER-2 positivefocus making her tumor heterogeneous.  Current treatment: Herceptinmaintenance therapy,anastrozole started 03/23/2018 She will complete Herceptin by 12/21/2018  Herceptin toxicities: Tolerating well.  Letrozole toxicities: Hot flashes Itching of skin Patient was taking both anastrozole and letrozole.  She has discontinued anastrozole and the jaw pain has resolved.  She is hoping that the skin itching and the hot flashes will also improve with this.  She saw her radiation oncologist who ordered a brain MRI and a mammogram on the right breast.  Apparently both of these were normal. She had a CT of the chest done for lung nodules  which are stable.  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 But Bumex has been helping her. I renewed her prescription for Bumex.  Chemo-induced peripheral neuropathy: I increased the dosage of Lyrica because the symptoms are continuing to persist. We will continue discussions about neratinib after the conclusion of Herceptin.  Patient husband is going through [facility] treatment in [place].  She is trying to get another opinion at [facility]. Return to clinic every 3 weeks for Herceptin and her last treatment is in 3 weeks.

## 2018-11-30 NOTE — Patient Instructions (Signed)
[facility] Discharge Instructions for Patients Receiving Chemotherapy  Today you received the following chemotherapy agents Herceptin  To help prevent nausea and vomiting after your treatment, we encourage you to take your nausea medication as directed   If you develop nausea and vomiting that is not controlled by your nausea medication, call the clinic.   BELOW ARE SYMPTOMS THAT SHOULD BE REPORTED IMMEDIATELY:  *FEVER GREATER THAN 100.5 F  *CHILLS WITH OR WITHOUT FEVER  NAUSEA AND VOMITING THAT IS NOT CONTROLLED WITH YOUR NAUSEA MEDICATION  *UNUSUAL SHORTNESS OF BREATH  *UNUSUAL BRUISING OR BLEEDING  TENDERNESS IN MOUTH AND THROAT WITH OR WITHOUT PRESENCE OF ULCERS  *URINARY PROBLEMS  *BOWEL PROBLEMS  UNUSUAL RASH Items with * indicate a potential emergency and should be followed up as soon as possible.  Feel free to call the clinic should you have any questions or concerns. The clinic phone number is [phone].  Please show the CHEMO ALERT CARD at check-in to the Emergency Department and triage nurse.   

## 2018-12-01 ENCOUNTER — Telehealth: Payer: Self-pay | Admitting: Hematology and Oncology

## 2018-12-01 NOTE — Telephone Encounter (Signed)
I could not reach patient regarding schedule  °

## 2018-12-12 ENCOUNTER — Encounter: Payer: Self-pay | Admitting: Hematology and Oncology

## 2018-12-20 ENCOUNTER — Telehealth: Payer: Self-pay | Admitting: Hematology and Oncology

## 2018-12-20 NOTE — Telephone Encounter (Signed)
Returned patient's phone call regarding rescheduling 10/14 appointments, per patient's request it has been moved to 10/19.   Message to provider.

## 2018-12-21 ENCOUNTER — Ambulatory Visit: Payer: Medicare Other

## 2018-12-26 ENCOUNTER — Other Ambulatory Visit: Payer: Self-pay

## 2018-12-26 ENCOUNTER — Encounter: Payer: Self-pay | Admitting: *Deleted

## 2018-12-26 ENCOUNTER — Inpatient Hospital Stay: Payer: Medicare Other | Attending: Hematology and Oncology

## 2018-12-26 VITALS — BP 168/79 | HR 62 | Temp 98.5°F | Resp 20 | Wt 223.2 lb

## 2018-12-26 DIAGNOSIS — Z923 Personal history of irradiation: Secondary | ICD-10-CM | POA: Diagnosis not present

## 2018-12-26 DIAGNOSIS — Z5112 Encounter for antineoplastic immunotherapy: Secondary | ICD-10-CM | POA: Diagnosis present

## 2018-12-26 DIAGNOSIS — Z79899 Other long term (current) drug therapy: Secondary | ICD-10-CM | POA: Diagnosis not present

## 2018-12-26 DIAGNOSIS — T451X5A Adverse effect of antineoplastic and immunosuppressive drugs, initial encounter: Secondary | ICD-10-CM | POA: Diagnosis not present

## 2018-12-26 DIAGNOSIS — C50412 Malignant neoplasm of upper-outer quadrant of left female breast: Secondary | ICD-10-CM | POA: Diagnosis present

## 2018-12-26 DIAGNOSIS — Z9012 Acquired absence of left breast and nipple: Secondary | ICD-10-CM | POA: Insufficient documentation

## 2018-12-26 DIAGNOSIS — Z170 Estrogen receptor positive status [ER+]: Secondary | ICD-10-CM | POA: Diagnosis not present

## 2018-12-26 DIAGNOSIS — I890 Lymphedema, not elsewhere classified: Secondary | ICD-10-CM | POA: Insufficient documentation

## 2018-12-26 DIAGNOSIS — G620 Drug-induced polyneuropathy: Secondary | ICD-10-CM | POA: Insufficient documentation

## 2018-12-26 MED ORDER — HEPARIN SOD (PORK) LOCK FLUSH 100 UNIT/ML IV SOLN
500.0000 [IU] | Freq: Once | INTRAVENOUS | Status: AC | PRN
  Administered 2018-12-26: 16:00:00 500 [IU]
  Filled 2018-12-26: qty 5

## 2018-12-26 MED ORDER — FAMOTIDINE IN NACL 20-0.9 MG/50ML-% IV SOLN
INTRAVENOUS | Status: AC
  Filled 2018-12-26: qty 50

## 2018-12-26 MED ORDER — FAMOTIDINE IN NACL 20-0.9 MG/50ML-% IV SOLN
20.0000 mg | Freq: Once | INTRAVENOUS | Status: AC
  Administered 2018-12-26: 14:00:00 20 mg via INTRAVENOUS

## 2018-12-26 MED ORDER — METHYLPREDNISOLONE SODIUM SUCC 125 MG IJ SOLR
INTRAMUSCULAR | Status: AC
  Filled 2018-12-26: qty 2

## 2018-12-26 MED ORDER — DIPHENHYDRAMINE HCL 25 MG PO CAPS
ORAL_CAPSULE | ORAL | Status: AC
  Filled 2018-12-26: qty 1

## 2018-12-26 MED ORDER — SODIUM CHLORIDE 0.9 % IV SOLN
Freq: Once | INTRAVENOUS | Status: AC
  Administered 2018-12-26: 14:00:00 via INTRAVENOUS
  Filled 2018-12-26: qty 250

## 2018-12-26 MED ORDER — TRASTUZUMAB CHEMO 150 MG IV SOLR
600.0000 mg | Freq: Once | INTRAVENOUS | Status: AC
  Administered 2018-12-26: 15:00:00 600 mg via INTRAVENOUS
  Filled 2018-12-26: qty 28.57

## 2018-12-26 MED ORDER — ACETAMINOPHEN 325 MG PO TABS
ORAL_TABLET | ORAL | Status: AC
  Filled 2018-12-26: qty 2

## 2018-12-26 MED ORDER — DIPHENHYDRAMINE HCL 25 MG PO CAPS
25.0000 mg | ORAL_CAPSULE | Freq: Once | ORAL | Status: AC
  Administered 2018-12-26: 25 mg via ORAL

## 2018-12-26 MED ORDER — SODIUM CHLORIDE 0.9% FLUSH
10.0000 mL | INTRAVENOUS | Status: DC | PRN
  Administered 2018-12-26: 16:00:00 10 mL
  Filled 2018-12-26: qty 10

## 2018-12-26 MED ORDER — METHYLPREDNISOLONE SODIUM SUCC 125 MG IJ SOLR
60.0000 mg | Freq: Once | INTRAMUSCULAR | Status: AC
  Administered 2018-12-26: 14:00:00 60 mg via INTRAVENOUS

## 2018-12-26 MED ORDER — ACETAMINOPHEN 325 MG PO TABS
650.0000 mg | ORAL_TABLET | Freq: Once | ORAL | Status: AC
  Administered 2018-12-26: 14:00:00 650 mg via ORAL

## 2018-12-26 NOTE — Patient Instructions (Signed)
[facility] Discharge Instructions for Patients Receiving Chemotherapy  Today you received the following Immunotherapy: Trastuzumab  To help prevent nausea and vomiting after your treatment, we encourage you to take your nausea medication as directed by your MD.   If you develop nausea and vomiting that is not controlled by your nausea medication, call the clinic.   BELOW ARE SYMPTOMS THAT SHOULD BE REPORTED IMMEDIATELY:  *FEVER GREATER THAN 100.5 F  *CHILLS WITH OR WITHOUT FEVER  NAUSEA AND VOMITING THAT IS NOT CONTROLLED WITH YOUR NAUSEA MEDICATION  *UNUSUAL SHORTNESS OF BREATH  *UNUSUAL BRUISING OR BLEEDING  TENDERNESS IN MOUTH AND THROAT WITH OR WITHOUT PRESENCE OF ULCERS  *URINARY PROBLEMS  *BOWEL PROBLEMS  UNUSUAL RASH Items with * indicate a potential emergency and should be followed up as soon as possible.  Feel free to call the clinic should you have any questions or concerns. The clinic phone number is [phone].  Please show the [place] at check-in to the Emergency Department and triage nurse.

## 2018-12-27 ENCOUNTER — Telehealth: Payer: Self-pay | Admitting: Adult Health

## 2018-12-27 NOTE — Telephone Encounter (Signed)
Called patient regarding new scheduled appointments per 10/19 schedule message, patient is notified.

## 2018-12-29 ENCOUNTER — Other Ambulatory Visit: Payer: Self-pay

## 2018-12-29 ENCOUNTER — Telehealth: Payer: Self-pay

## 2018-12-29 DIAGNOSIS — Z170 Estrogen receptor positive status [ER+]: Secondary | ICD-10-CM

## 2018-12-29 DIAGNOSIS — C50412 Malignant neoplasm of upper-outer quadrant of left female breast: Secondary | ICD-10-CM

## 2018-12-29 NOTE — Telephone Encounter (Signed)
Pt requesting to have port removal at [facility] placed orders, patient aware.

## 2019-01-02 ENCOUNTER — Other Ambulatory Visit: Payer: Self-pay | Admitting: Hematology and Oncology

## 2019-01-03 ENCOUNTER — Telehealth: Payer: Self-pay | Admitting: *Deleted

## 2019-01-03 NOTE — Telephone Encounter (Signed)
Records faxed to [facility] - att: Carolyn/Dr. Rhunette Croft - release [id number]

## 2019-01-05 ENCOUNTER — Encounter: Payer: Self-pay | Admitting: Adult Health

## 2019-01-05 ENCOUNTER — Encounter: Payer: Self-pay | Admitting: Hematology and Oncology

## 2019-01-09 ENCOUNTER — Other Ambulatory Visit: Payer: Self-pay

## 2019-01-09 DIAGNOSIS — Z170 Estrogen receptor positive status [ER+]: Secondary | ICD-10-CM

## 2019-01-09 DIAGNOSIS — C50412 Malignant neoplasm of upper-outer quadrant of left female breast: Secondary | ICD-10-CM

## 2019-01-12 ENCOUNTER — Encounter: Payer: Self-pay | Admitting: Adult Health

## 2019-01-30 ENCOUNTER — Other Ambulatory Visit: Payer: Self-pay | Admitting: Radiology

## 2019-01-31 ENCOUNTER — Ambulatory Visit (HOSPITAL_COMMUNITY)
Admission: RE | Admit: 2019-01-31 | Discharge: 2019-01-31 | Disposition: A | Discharge status: Home / Self Care | Payer: Medicare Other | Source: Ambulatory Visit | Attending: Hematology and Oncology | Admitting: Hematology and Oncology

## 2019-01-31 ENCOUNTER — Other Ambulatory Visit: Payer: Self-pay

## 2019-01-31 ENCOUNTER — Encounter (HOSPITAL_COMMUNITY): Payer: Self-pay

## 2019-01-31 DIAGNOSIS — C50412 Malignant neoplasm of upper-outer quadrant of left female breast: Secondary | ICD-10-CM | POA: Insufficient documentation

## 2019-01-31 DIAGNOSIS — Z170 Estrogen receptor positive status [ER+]: Secondary | ICD-10-CM | POA: Insufficient documentation

## 2019-01-31 HISTORY — PX: IR REMOVAL TUN ACCESS W/ PORT W/O FL MOD SED: IMG2290

## 2019-01-31 LAB — CBC
HCT: 41.9 % (ref 36.0–46.0)
Hemoglobin: 13.8 g/dL (ref 12.0–15.0)
MCH: 32.3 pg (ref 26.0–34.0)
MCHC: 32.9 g/dL (ref 30.0–36.0)
MCV: 98.1 fL (ref 80.0–100.0)
Platelets: 175 10*3/uL (ref 150–400)
RBC: 4.27 MIL/uL (ref 3.87–5.11)
RDW: 12.9 % (ref 11.5–15.5)
WBC: 4 10*3/uL (ref 4.0–10.5)
nRBC: 0 % (ref 0.0–0.2)

## 2019-01-31 LAB — APTT: aPTT: 30 seconds (ref 24–36)

## 2019-01-31 LAB — PROTIME-INR
INR: 1 (ref 0.8–1.2)
Prothrombin Time: 12.9 seconds (ref 11.4–15.2)

## 2019-01-31 MED ORDER — LIDOCAINE HCL 1 % IJ SOLN
INTRAMUSCULAR | Status: AC | PRN
  Administered 2019-01-31: 5 mL

## 2019-01-31 MED ORDER — FENTANYL CITRATE (PF) 100 MCG/2ML IJ SOLN
INTRAMUSCULAR | Status: AC
  Filled 2019-01-31: qty 2

## 2019-01-31 MED ORDER — MIDAZOLAM HCL 2 MG/2ML IJ SOLN
INTRAMUSCULAR | Status: AC
  Filled 2019-01-31: qty 2

## 2019-01-31 MED ORDER — LIDOCAINE HCL 1 % IJ SOLN
INTRAMUSCULAR | Status: AC
  Filled 2019-01-31: qty 20

## 2019-01-31 MED ORDER — CLINDAMYCIN PHOSPHATE 900 MG/50ML IV SOLN
900.0000 mg | Freq: Once | INTRAVENOUS | Status: AC
  Administered 2019-01-31: 11:00:00 900 mg via INTRAVENOUS

## 2019-01-31 MED ORDER — CLINDAMYCIN PHOSPHATE 900 MG/50ML IV SOLN
INTRAVENOUS | Status: AC
  Administered 2019-01-31: 900 mg via INTRAVENOUS
  Filled 2019-01-31: qty 50

## 2019-01-31 MED ORDER — SODIUM CHLORIDE 0.9 % IV SOLN
Freq: Once | INTRAVENOUS | Status: AC
  Administered 2019-01-31: 11:00:00 via INTRAVENOUS

## 2019-01-31 NOTE — Procedures (Signed)
Interventional Radiology Procedure Note  Procedure: Port removal  Complications: None  Estimated Blood Loss: < 10 mL  Findings: Right chest port removed in entirety. Pocket irrigated and closed.  Venetia Night. Kathlene Cote, M.D Pager:  [phone]

## 2019-01-31 NOTE — Discharge Instructions (Signed)
Implanted Port Removal, Care After °This sheet gives you information about how to care for yourself after your procedure. Your health care provider may also give you more specific instructions. If you have problems or questions, contact your health care provider. °What can I expect after the procedure? °After the procedure, it is common to have: °· Soreness or pain near your incision. °· Some swelling or bruising near your incision. °Follow these instructions at home: °Medicines °· Take over-the-counter and prescription medicines only as told by your health care provider. °· If you were prescribed an antibiotic medicine, take it as told by your health care provider. Do not stop taking the antibiotic even if you start to feel better. °Bathing °· Do not take baths, swim, or use a hot tub until your health care provider approves. Ask your health care provider if you can take showers. You may only be allowed to take sponge baths. °Incision care ° °· Follow instructions from your health care provider about how to take care of your incision. Make sure you: °? Wash your hands with soap and water before you change your bandage (dressing). If soap and water are not available, use hand sanitizer. °? Change your dressing as told by your health care provider. °? Keep your dressing dry. °? Leave stitches (sutures), skin glue, or adhesive strips in place. These skin closures may need to stay in place for 2 weeks or longer. If adhesive strip edges start to loosen and curl up, you may trim the loose edges. Do not remove adhesive strips completely unless your health care provider tells you to do that. °· Check your incision area every day for signs of infection. Check for: °? More redness, swelling, or pain. °? More fluid or blood. °? Warmth. °? Pus or a bad smell. °Driving ° °· Do not drive for 24 hours if you were given a medicine to help you relax (sedative) during your procedure. °· If you did not receive a sedative, ask your  health care provider when it is safe to drive. °Activity °· Return to your normal activities as told by your health care provider. Ask your health care provider what activities are safe for you. °· Do not lift anything that is heavier than 10 lb (4.5 kg), or the limit that you are told, until your health care provider says that it is safe. °· Do not do activities that involve lifting your arms over your head. °General instructions °· Do not use any products that contain nicotine or tobacco, such as cigarettes and e-cigarettes. These can delay healing. If you need help quitting, ask your health care provider. °· Keep all follow-up visits as told by your health care provider. This is important. °Contact a health care provider if: °· You have more redness, swelling, or pain around your incision. °· You have more fluid or blood coming from your incision. °· Your incision feels warm to the touch. °· You have pus or a bad smell coming from your incision. °· You have pain that is not relieved by your pain medicine. °Get help right away if you have: °· A fever or chills. °· Chest pain. °· Difficulty breathing. °Summary °· After the procedure, it is common to have pain, soreness, swelling, or bruising near your incision. °· If you were prescribed an antibiotic medicine, take it as told by your health care provider. Do not stop taking the antibiotic even if you start to feel better. °· Do not drive for 24 hours   if you were given a sedative during your procedure. °· Return to your normal activities as told by your health care provider. Ask your health care provider what activities are safe for you. °This information is not intended to replace advice given to you by your health care provider. Make sure you discuss any questions you have with your health care provider. °Document Released: 02/04/2015 Document Revised: 04/08/2017 Document Reviewed: 04/08/2017 °Elsevier Patient Education © 2020 [employer] ° ° ° °Moderate  Conscious Sedation, Adult, Care After °These instructions provide you with information about caring for yourself after your procedure. Your health care provider may also give you more specific instructions. Your treatment has been planned according to current medical practices, but problems sometimes occur. Call your health care provider if you have any problems or questions after your procedure. °What can I expect after the procedure? °After your procedure, it is common: °· To feel sleepy for several hours. °· To feel clumsy and have poor balance for several hours. °· To have poor judgment for several hours. °· To vomit if you eat too soon. °Follow these instructions at home: °For at least 24 hours after the procedure: ° °· Do not: °? Participate in activities where you could fall or become injured. °? Drive. °? Use heavy machinery. °? Drink alcohol. °? Take sleeping pills or medicines that cause drowsiness. °? Make important decisions or sign legal documents. °? Take care of children on your own. °· Rest. °Eating and drinking °· Follow the diet recommended by your health care provider. °· If you vomit: °? Drink water, juice, or soup when you can drink without vomiting. °? Make sure you have little or no nausea before eating solid foods. °General instructions °· Have a responsible adult stay with you until you are awake and alert. °· Take over-the-counter and prescription medicines only as told by your health care provider. °· If you smoke, do not smoke without supervision. °· Keep all follow-up visits as told by your health care provider. This is important. °Contact a health care provider if: °· You keep feeling nauseous or you keep vomiting. °· You feel light-headed. °· You develop a rash. °· You have a fever. °Get help right away if: °· You have trouble breathing. °This information is not intended to replace advice given to you by your health care provider. Make sure you discuss any questions you have with your  health care provider. °Document Released: 12/14/2012 Document Revised: 02/05/2017 Document Reviewed: 06/15/2015 °Elsevier Patient Education © 2020 [employer] Inc[employer] ° °

## 2019-01-31 NOTE — Progress Notes (Signed)
Patient ID: Breanna [name], female   DOB: 04/16/1945, 73 y.o.   MRN: [id number] Patient presents to IR department today for Port-A-Cath removal.  She has a history of left breast cancer and is status post treatment.  Her port was initially placed on 07/19/2017.  Details/risks of Port-A-Cath removal, including but not limited to, internal bleeding, infection, injury to adjacent structures discussed with patient with her understanding and consent.  She does not wish to receive IV conscious sedation for the procedure.

## 2019-04-02 ENCOUNTER — Other Ambulatory Visit: Payer: Self-pay | Admitting: Hematology and Oncology

## 2019-04-06 ENCOUNTER — Inpatient Hospital Stay: Payer: Medicare Other | Attending: Adult Health | Admitting: Adult Health

## 2019-04-06 ENCOUNTER — Other Ambulatory Visit: Payer: Self-pay

## 2019-04-06 ENCOUNTER — Encounter: Payer: Self-pay | Admitting: Adult Health

## 2019-04-06 VITALS — BP 156/60 | HR 56 | Temp 97.7°F | Resp 18 | Ht 64.0 in | Wt 216.4 lb

## 2019-04-06 DIAGNOSIS — E2839 Other primary ovarian failure: Secondary | ICD-10-CM

## 2019-04-06 DIAGNOSIS — Z923 Personal history of irradiation: Secondary | ICD-10-CM | POA: Diagnosis not present

## 2019-04-06 DIAGNOSIS — T451X5A Adverse effect of antineoplastic and immunosuppressive drugs, initial encounter: Secondary | ICD-10-CM | POA: Diagnosis not present

## 2019-04-06 DIAGNOSIS — Z79899 Other long term (current) drug therapy: Secondary | ICD-10-CM | POA: Diagnosis not present

## 2019-04-06 DIAGNOSIS — Z170 Estrogen receptor positive status [ER+]: Secondary | ICD-10-CM | POA: Diagnosis not present

## 2019-04-06 DIAGNOSIS — I890 Lymphedema, not elsewhere classified: Secondary | ICD-10-CM | POA: Insufficient documentation

## 2019-04-06 DIAGNOSIS — Z9012 Acquired absence of left breast and nipple: Secondary | ICD-10-CM | POA: Diagnosis not present

## 2019-04-06 DIAGNOSIS — G620 Drug-induced polyneuropathy: Secondary | ICD-10-CM | POA: Insufficient documentation

## 2019-04-06 DIAGNOSIS — C50412 Malignant neoplasm of upper-outer quadrant of left female breast: Secondary | ICD-10-CM | POA: Insufficient documentation
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
## 2019-04-07 ENCOUNTER — Telehealth: Payer: Self-pay | Admitting: Adult Health

## 2019-04-07 NOTE — Telephone Encounter (Signed)
I talk with patient regarding schedule  

## 2019-04-15 ENCOUNTER — Other Ambulatory Visit: Payer: Self-pay | Admitting: Hematology and Oncology

## 2019-04-24 IMAGING — XA IR FLUORO GUIDE CV LINE*R*
1 series · 1 of 1 positions shown · non-contrast
Comparison: none

CLINICAL DATA: Left-sided breast carcinoma and need for porta cath
for chemotherapy.

[Series 1: fl (-) angio · 1 of 1 slices shown]
[im 1/1]
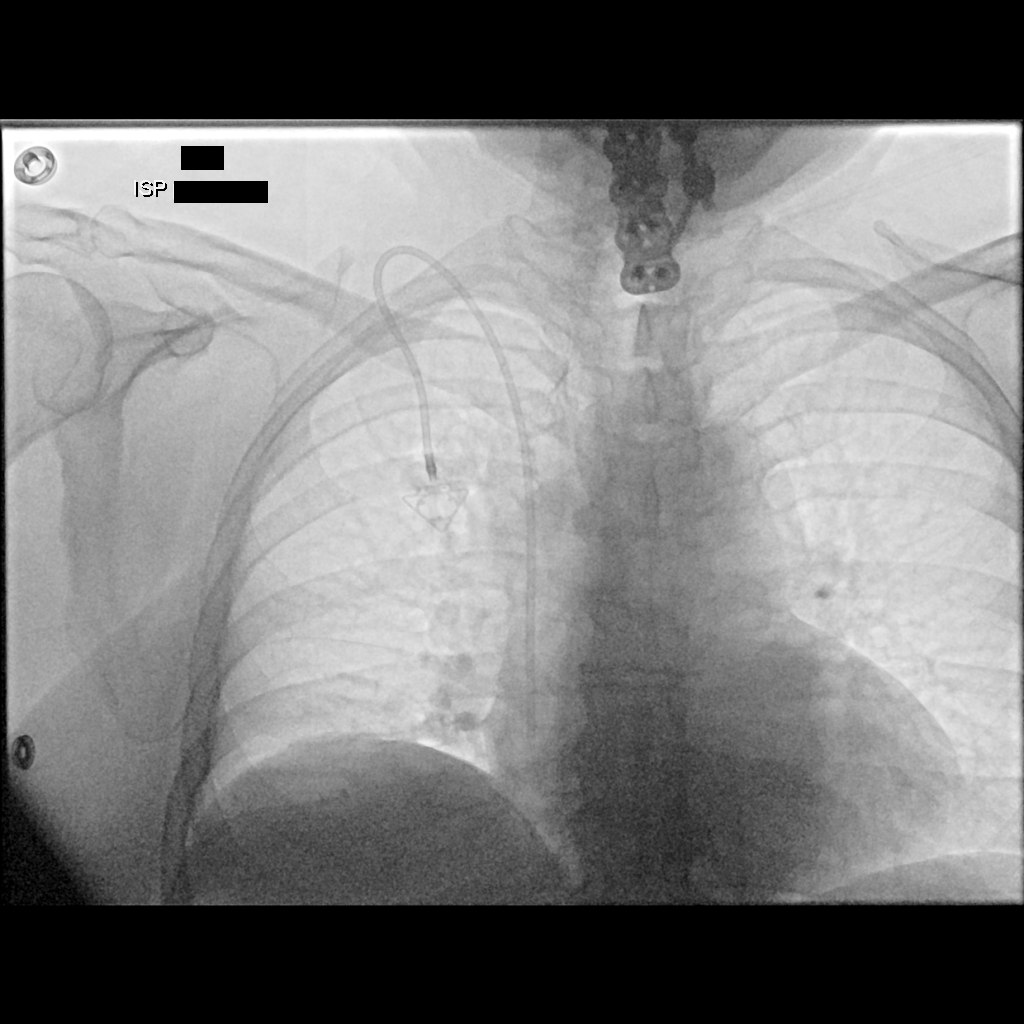

[1 of 1 positions shown; findings below may reference images not displayed]

EXAM:
IMPLANTED PORT A CATH PLACEMENT WITH ULTRASOUND AND FLUOROSCOPIC
GUIDANCE

ANESTHESIA/SEDATION:
2.0 mg IV Versed; 100 mcg IV Fentanyl

Total Moderate Sedation Time:  38 minutes

The patient's level of consciousness and physiologic status were
continuously monitored during the procedure by Radiology nursing.

Additional Medications: 900 mg IV clindamycin. Initially, IV
vancomycin was started. However, due to development of a rash in the
distal forearm at the site of IV vancomycin infusion, the infusion
was discontinued.

FLUOROSCOPY TIME:  24 seconds.  2.8 mGy.

PROCEDURE:
The procedure, risks, benefits, and alternatives were explained to
the patient. Questions regarding the procedure were encouraged and
answered. The patient understands and consents to the procedure. A
time-out was performed prior to initiating the procedure.

Ultrasound was utilized to confirm patency of the right internal
jugular vein. The right neck and chest were prepped with
chlorhexidine in a sterile fashion, and a sterile drape was applied
covering the operative field. Maximum barrier sterile technique with
sterile gowns and gloves were used for the procedure. Local
anesthesia was provided with 1% lidocaine.

After creating a small venotomy incision, a 21 gauge needle was
advanced into the right internal jugular vein under direct,
real-time ultrasound guidance. Ultrasound image documentation was
performed. After securing guidewire access, an 8 Fr dilator was
placed. A J-wire was kinked to measure appropriate catheter length.

A subcutaneous port pocket was then created along the upper chest
wall utilizing sharp and blunt dissection. Portable cautery was
utilized. The pocket was irrigated with sterile saline.

A single lumen power injectable port was chosen for placement. The 8
Fr catheter was tunneled from the port pocket site to the venotomy
incision. The port was placed in the pocket. External catheter was
trimmed to appropriate length based on guidewire measurement.

At the venotomy, an 8 Fr peel-away sheath was placed over a
guidewire. The catheter was then placed through the sheath and the
sheath removed. Final catheter positioning was confirmed and
documented with a fluoroscopic spot image. The port was accessed
with a needle and aspirated and flushed with heparinized saline. The
access needle was removed.

The venotomy and port pocket incisions were closed with subcutaneous
3-0 Monocryl and subcuticular 4-0 Vicryl. Dermabond was applied to
both incisions.

COMPLICATIONS:
COMPLICATIONS
None
FINDINGS: After catheter placement, the tip lies at the [place] junction.
The catheter aspirates normally and is ready for immediate use.
IMPRESSION: Placement of single lumen port a cath via right internal jugular
vein. The catheter tip lies at the [place] junction. A power
injectable port a cath was placed and is ready for immediate use.

## 2019-04-26 ENCOUNTER — Encounter: Payer: Self-pay | Admitting: Adult Health

## 2019-04-27 ENCOUNTER — Telehealth: Payer: Self-pay | Admitting: *Deleted

## 2019-04-27 NOTE — Telephone Encounter (Signed)
Breanna [name] Please tell her to stop letrozole. I will do a MyChart virtual visit next week. I sent a message to scheduling regarding this. Thanks VG

## 2019-04-27 NOTE — Telephone Encounter (Signed)
Telephone call to patient to discuss coming in for an evaluation of her symptoms reported in the [employer].  She can not bear any weight on her right heal. She feels sure this is from Letrozole. This is not a new pain. She states this was all discussed during her last office visit with the NP. The patient states she felt rushed through her appointment and her concerns were not addressed. Her back pain is excruciating. She has itching on her backside. Her hot flashes are out of control. She has increased confusion with normal routine daily tasks to the point her family makes jokes at her expense. Patient only wants to see Dr. Lindi Adie from now on. Patient really wants to discuss stopping Letrozole but is concerned about recurrence.  She questions if it is really necessary to come in to be evaluated since she just had a full physical evaluation with Mendel Ryder.

## 2019-04-27 NOTE — Telephone Encounter (Signed)
Per MD pt to stop taking Letrozole and will have a follow up visit next week with MD.  MD sent scheduling message and pt educated by RN to stop Letrozole to see if symptoms of bone pain resolve.  Pt verbalized understanding.

## 2019-05-01 ENCOUNTER — Telehealth: Payer: Self-pay | Admitting: Hematology and Oncology

## 2019-05-01 NOTE — Telephone Encounter (Signed)
Called pt per 2/18 sch message - no answer - left message with appt date and time

## 2019-05-03 NOTE — Progress Notes (Signed)
HEMATOLOGY-ONCOLOGY MYCHART VIDEO VISIT PROGRESS NOTE  I connected with Breanna [name] on 05/04/2019 at 12:00 PM EST by MyChart video conference and verified that I am speaking with the correct person using two identifiers.  I discussed the limitations, risks, security and privacy concerns of performing an evaluation and management service by MyChart and the availability of in person appointments.  I also discussed with the patient that there may be a patient responsible charge related to this service. The patient expressed understanding and agreed to proceed.  Patient's Location: Home Physician Location: Clinic  Patient was unable to connect through my chart.  We did a telephone visit instead.  CHIEF COMPLIANT: Follow-up to discuss alternative antiestrogen therapies  INTERVAL HISTORY: Breanna [name] is a 74 y.o. female with above-mentioned history of HER-2 positive breast cancer treated with left mastectomy, adjuvant chemotherapy, radiation, and Herceptin maintenance. She was antiestrogen therapy with anastrozole, but it was stopped 1 week ago due to back pain, hot flashes, itching, and mental cloudiness. She presents over [place] today discuss alternative antiestrogen therapies.  She complains of the following symptoms 1.  Severe leg discomfort that radiates in the back down to both legs 2.  New onset of shingles on the thigh: She was prescribed Valtrex yesterday.  She will start taking it today. 3.  Frequent yeast infections in the groin  4.  Severe hot flashes and severe fatigue  Oncology History  Malignant neoplasm of upper-outer quadrant of left breast in female, estrogen receptor positive ([place])  05/13/2017 Initial Diagnosis   [place] biopsy: Left breast biopsy 2 o'clock position 8 cm from nipple ultrasound-guided biopsy: Grade 2 IDC, ER 50%, PR 3%, HER-2 equivocal Ki-67 15% nipple area biopsy grade 2 IDC, ER 100% positive, PR negative, HER-2 equivocal, Ki-67 40%   05/31/2017  Breast MRI   Left breast UOQ 230 position: 2 x 1.9 x 1.9 cm mass, second mass left breast UOQ 230 position: 1 x 1 x 0.7 cm, 2 more enhancing adjacent 3 and 5 mm nodules slightly superiorly midway between the 2 masses.  No abnormal lymph nodes, right breast normal   06/11/2017 Surgery   Left Mastectomy: 2 foci of IDC 2.2 and 1 cm Grade 2, 1/2 LN Positive; Grade 2 IDC, ER 50%, PR 3%, HER-2 equivocal Ki-67 15% nipple area biopsy grade 2 IDC, ER 100% positive, PR negative, HER-2 equivocal, Ki-67 40% T2N1a Stage 1B   06/11/2017 Miscellaneous   MammaPrint results reveal a high risk luminal type B with a 94.6% predicted benefit of treatment at 5 years with both chemotherapy and hormonal therapy.     07/23/2017 - 11/15/2017 Chemotherapy   DOXOrubicin (ADRIAMYCIN) chemo injection 106 mg, 50 mg/m2 = 106 mg (83.3 % of original dose 60 mg/m2), Intravenous,  Once, 4 of 4 cycles. Dose modification: 50 mg/m2 (original dose 60 mg/m2, Cycle 1, Reason: Provider Judgment), 50 mg/m2 (original dose 60 mg/m2, Cycle 2, Reason: Provider Judgment). Administration: 106 mg (07/23/2017), 106 mg (08/11/2017), 106 mg (08/25/2017), 106 mg (09/08/2017)  palonosetron (ALOXI) injection 0.25 mg, 0.25 mg, Intravenous,  Once, 4 of 4 cycles. Administration: 0.25 mg (07/23/2017), 0.25 mg (08/11/2017), 0.25 mg (08/25/2017), 0.25 mg (09/08/2017)  pegfilgrastim (NEULASTA ONPRO KIT) injection 6 mg, 6 mg, Subcutaneous, Once, 3 of 3 cycles. Administration: 6 mg (08/11/2017), 6 mg (08/25/2017), 6 mg (09/08/2017)  pegfilgrastim-cbqv (UDENYCA) injection 6 mg, 6 mg, Subcutaneous, Once, 1 of 1 cycle. Administration: 6 mg (07/26/2017).   cyclophosphamide (CYTOXAN) 1,060 mg in sodium chloride 0.9 % 250  mL chemo infusion, 500 mg/m2 = 1,060 mg (83.3 % of original dose 600 mg/m2), Intravenous,  Once, 4 of 4 cycles. Dose modification: 500 mg/m2 (original dose 600 mg/m2, Cycle 1, Reason: Provider Judgment), 500 mg/m2 (original dose 600 mg/m2, Cycle 2, Reason: Provider  Judgment). Administration: 1,060 mg (07/23/2017), 1,060 mg (08/11/2017), 1,060 mg (08/25/2017), 1,060 mg (09/08/2017)  PACLitaxel (TAXOL) 168 mg in sodium chloride 0.9 % 250 mL chemo infusion (</= '80mg'$ /m2), 80 mg/m2 = 168 mg, Intravenous,  Once, 7 of 12 cycles. Dose modification: 60 mg/m2 (original dose 80 mg/m2, Cycle 9, Reason: Dose not tolerated), 50 mg/m2 (original dose 80 mg/m2, Cycle 10, Reason: Dose not tolerated), 40 mg/m2 (original dose 40 mg/m2, Cycle 11, Reason: Provider Judgment). Administration: 168 mg (09/22/2017), 168 mg (10/13/2017), 168 mg (10/20/2017), 126 mg (10/27/2017), 108 mg (11/04/2017), 84 mg (11/10/2017)  fosaprepitant (EMEND) 150 mg, dexamethasone (DECADRON) 12 mg in sodium chloride 0.9 % 145 mL IVPB, , Intravenous,  Once, 4 of 4 cycles. Administration:  (07/23/2017),  (08/11/2017),  (08/25/2017),  (09/08/2017)  Tbo-Filgrastim (GRANIX) injection 480 mcg, 480 mcg (100 % of original dose 480 mcg), Subcutaneous,  Once, 3 of 8 cycles. Dose modification: 480 mcg (original dose 480 mcg, Cycle 9), 480 mcg (original dose 480 mcg, Cycle 9). Administration: 480 mcg (11/02/2017), 480 mcg (11/06/2017), 480 mcg (11/15/2017)  for chemotherapy treatment.   trastuzumab (HERCEPTIN) 798 mg in sodium chloride 0.9 % 250 mL chemo infusion, 8 mg/kg = 798 mg, Intravenous,  Once, 17 of 17 cycles Administration: 798 mg (12/29/2017), 588 mg (01/19/2018), 600 mg (03/23/2018), 600 mg (05/04/2018), 600 mg (05/26/2018), 600 mg (06/14/2018), 600 mg (07/06/2018), 600 mg (07/27/2018), 600 mg (08/17/2018), 600 mg (09/07/2018), 600 mg (09/28/2018), 600 mg (10/19/2018), 600 mg (11/09/2018), 600 mg (11/30/2018), 600 mg (12/26/2018), 600 mg (02/09/2018), 600 mg (03/03/2018)  for chemotherapy treatment.    12/10/2017 Pathology Results   Repeat analysis of pathology confirmed that patient has heterogeneous HER-2 signaling.  There is equivocal IHC 2+ and fish negative areas where the ratio was 1.49 and copy number of 2.9.  There is one area that is HER-2  +3+ by [facility]   01/05/2018 - 02/22/2018 Radiation Therapy   Adj XRT at [facility] Gwendlyn Deutscher, MD) (1) 50 Gy in 25 fractions to the Left Axilla (2) 30 Gy in 15 fractions (3) 20 Gy in 10 fractions (4) Boost: 10 Gy in 5 fractions   03/2018 - 03/2025 Anti-estrogen oral therapy   Anastrozole, '1mg'$  daily stopped due to itching; switched to letrozole 06/2018     Observations/Objective:  There were no vitals filed for this visit. There is no height or weight on file to calculate BMI.  I have reviewed the data as listed CMP Latest Ref Rng & Units 11/30/2018 10/19/2018 09/07/2018  Glucose 70 - 99 mg/dL 123(H) 188(H) 142(H)  BUN 8 - 23 mg/dL '12 12 12  '$ Creatinine 0.44 - 1.00 mg/dL 0.87 0.83 0.85  Sodium 135 - 145 mmol/L 144 141 141  Potassium 3.5 - 5.1 mmol/L 3.9 3.6 3.7  Chloride 98 - 111 mmol/L 104 103 99  CO2 22 - 32 mmol/L 30 28 32  Calcium 8.9 - 10.3 mg/dL 9.0 9.2 8.9  Total Protein 6.5 - 8.1 g/dL 6.6 6.5 6.6  Total Bilirubin 0.3 - 1.2 mg/dL 0.3 0.4 0.4  Alkaline Phos 38 - 126 U/L 116 93 100  AST 15 - 41 U/L 34 33 34  ALT 0 - 44 U/L 33 34 31    Lab Results  Component Value Date   WBC 4.0 01/31/2019   HGB 13.8 01/31/2019   HCT 41.9 01/31/2019   MCV 98.1 01/31/2019   PLT 175 01/31/2019   NEUTROABS 2.0 11/30/2018      Assessment Plan:  Malignant neoplasm of upper-outer quadrant of left breast in female, estrogen receptor positive ([place]) 05/13/17: [place] biopsy: Left breast biopsy 2 o'clock position 8 cm from nipple ultrasound-guided biopsy: Grade 2 IDC, ER 50%, PR 3%, HER-2 equivocal Ki-67 15% nipple area biopsy grade 2 IDC, ER 100% positive, PR negative, HER-2 equivocal, Ki-67 40%  06/11/17:Left Mastectomy: 2 foci of IDC 2.2 and 1 cm Grade 2, 1/2 LN Positive; Grade 2 IDC, ER 50%, PR 3%, HER-2 equivocal Ki-67 15% nipple area biopsy grade 2 IDC, ER 100% positive, PR negative, HER-2 equivocal, Ki-67 40% T2N1a Stage 1B I discussed with her that given the fact that she has lymph  node positive disease, this is clinically high risk disease.  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, potential benefit of treatment at 5 years: 94.6% distant metastasis free interval for patients treated with chemotherapy  Treatment summary: 1.Adjuvant chemotherapy with dose dense Adriamycin and Cytoxan x4 followed by Taxol weekly x6 (stopped for neuropathy)07/23/2017 to 11/10/2017 Herceptin adjuvant treatment started 12/29/2017 2.followed by radiation [place]:Completed 02/22/2018 3.Followed by adjuvant antiestrogen therapy with Anastrozole '1mg'$  daily x5 to 7 years ----------------------------------------------------------------------- Repeat assessment revealed HER-2 negative focus but also HER-2 positivefocus making her tumor heterogeneous.  Current treatment: Herceptinmaintenance therapy,anastrozole started 03/23/2018 She completed Herceptin 12/21/2018  Letrozole toxicities: 1.  Severe hot flashes: I increase the dosage of Paxil to 20 mg daily 2.  Frequent yeast infections: I sent a prescription for Diflucan for 14 days.  Encouraged her to take probiotics. 3.  Severe leg pains: Uncertain if it is related to the recurrent episode of shingles.  If in a week her symptoms do not get up with Valtrex then we may have to perform an MRI of the back. 4.  Severe fatigue: We will check for thyroid function panel along with CBC CMP today.   She saw her radiation oncologist who ordered a brain MRI and a mammogram on the right breast. Apparently both of these were normal. She had a CT of the chest done for lung nodules which are stable.  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  on Bumex.  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   Patient husband is going through [place] treatment in [place]. She is trying to get another opinion at [facility]. I will speak to her in 1 week to go over her  symptoms.    I discussed the assessment and treatment plan with the patient. The patient was provided an opportunity to ask questions and all were answered. The patient agreed with the plan and demonstrated an understanding of the instructions. The patient was advised to call back or seek an in-person evaluation if the symptoms worsen or if the condition fails to improve as anticipated.   I provided 30 minutes of face-to-face MyChart video visit time during this encounter.    Rulon Eisenmenger, MD 05/04/2019   I, Molly Dorshimer, am acting as scribe for Nicholas Lose, MD.  I have reviewed the above documentation for accuracy and completeness, and I agree with the above.

## 2019-05-04 ENCOUNTER — Inpatient Hospital Stay: Payer: Medicare Other | Attending: Adult Health

## 2019-05-04 ENCOUNTER — Other Ambulatory Visit: Payer: Self-pay

## 2019-05-04 ENCOUNTER — Telehealth: Payer: Self-pay | Admitting: Hematology and Oncology

## 2019-05-04 ENCOUNTER — Telehealth (HOSPITAL_BASED_OUTPATIENT_CLINIC_OR_DEPARTMENT_OTHER): Payer: Medicare Other | Admitting: Hematology and Oncology

## 2019-05-04 DIAGNOSIS — C50412 Malignant neoplasm of upper-outer quadrant of left female breast: Secondary | ICD-10-CM | POA: Insufficient documentation

## 2019-05-04 DIAGNOSIS — Z9012 Acquired absence of left breast and nipple: Secondary | ICD-10-CM | POA: Diagnosis not present

## 2019-05-04 DIAGNOSIS — Z9221 Personal history of antineoplastic chemotherapy: Secondary | ICD-10-CM | POA: Diagnosis not present

## 2019-05-04 DIAGNOSIS — Z79899 Other long term (current) drug therapy: Secondary | ICD-10-CM | POA: Diagnosis not present

## 2019-05-04 DIAGNOSIS — Z79811 Long term (current) use of aromatase inhibitors: Secondary | ICD-10-CM | POA: Insufficient documentation

## 2019-05-04 DIAGNOSIS — Z170 Estrogen receptor positive status [ER+]: Secondary | ICD-10-CM

## 2019-05-04 DIAGNOSIS — Z923 Personal history of irradiation: Secondary | ICD-10-CM | POA: Insufficient documentation

## 2019-05-04 LAB — CBC WITH DIFFERENTIAL (CANCER CENTER ONLY)
Abs Immature Granulocytes: 0.01 10*3/uL (ref 0.00–0.07)
Basophils Absolute: 0 10*3/uL (ref 0.0–0.1)
Basophils Relative: 1 %
Eosinophils Absolute: 0.1 10*3/uL (ref 0.0–0.5)
Eosinophils Relative: 1 %
HCT: 43.6 % (ref 36.0–46.0)
Hemoglobin: 14.8 g/dL (ref 12.0–15.0)
Immature Granulocytes: 0 %
Lymphocytes Relative: 30 %
Lymphs Abs: 1.4 10*3/uL (ref 0.7–4.0)
MCH: 33.1 pg (ref 26.0–34.0)
MCHC: 33.9 g/dL (ref 30.0–36.0)
MCV: 97.5 fL (ref 80.0–100.0)
Monocytes Absolute: 0.5 10*3/uL (ref 0.1–1.0)
Monocytes Relative: 11 %
Neutro Abs: 2.8 10*3/uL (ref 1.7–7.7)
Neutrophils Relative %: 57 %
Platelet Count: 175 10*3/uL (ref 150–400)
RBC: 4.47 MIL/uL (ref 3.87–5.11)
RDW: 13 % (ref 11.5–15.5)
WBC Count: 4.8 10*3/uL (ref 4.0–10.5)
nRBC: 0 % (ref 0.0–0.2)

## 2019-05-04 LAB — CMP (CANCER CENTER ONLY)
ALT: 32 U/L (ref 0–44)
AST: 32 U/L (ref 15–41)
Albumin: 4.1 g/dL (ref 3.5–5.0)
Alkaline Phosphatase: 107 U/L (ref 38–126)
Anion gap: 9 (ref 5–15)
BUN: 22 mg/dL (ref 8–23)
CO2: 29 mmol/L (ref 22–32)
Calcium: 9.3 mg/dL (ref 8.9–10.3)
Chloride: 104 mmol/L (ref 98–111)
Creatinine: 0.77 mg/dL (ref 0.44–1.00)
GFR, Est AFR Am: >60 mL/min (ref >60)
GFR, Estimated: >60 mL/min (ref >60)
Glucose, Bld: 92 mg/dL (ref 70–99)
Potassium: 4 mmol/L (ref 3.5–5.1)
Sodium: 142 mmol/L (ref 135–145)
Total Bilirubin: 0.4 mg/dL (ref 0.3–1.2)
Total Protein: 7.1 g/dL (ref 6.5–8.1)

## 2019-05-04 MED ORDER — FLUCONAZOLE 200 MG PO TABS: 200.0000 mg | ORAL_TABLET | Freq: Every day | ORAL | 1 refills | Status: DC

## 2019-05-04 MED ORDER — PAROXETINE HCL 20 MG PO TABS: 20.0000 mg | ORAL_TABLET | Freq: Every day | ORAL | 3 refills | Status: DC

## 2019-05-04 NOTE — Telephone Encounter (Signed)
Scheduled appt per 2/25 sch message - unable to reach pt . Left message with appt date and time

## 2019-05-04 NOTE — Assessment & Plan Note (Signed)
05/13/17: [facility] biopsy: Left breast biopsy 2 o'clock position 8 cm from nipple ultrasound-guided biopsy: Grade 2 IDC, ER 50%, PR 3%, HER-2 equivocal Ki-67 15% nipple area biopsy grade 2 IDC, ER 100% positive, PR negative, HER-2 equivocal, Ki-67 40%  06/11/17:Left Mastectomy: 2 foci of IDC 2.2 and 1 cm Grade 2, 1/2 LN Positive; Grade 2 IDC, ER 50%, PR 3%, HER-2 equivocal Ki-67 15% nipple area biopsy grade 2 IDC, ER 100% positive, PR negative, HER-2 equivocal, Ki-67 40% T2N1a Stage 1B I discussed with her that given the fact that she has lymph node positive disease, this is clinically high risk disease.  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, potential benefit of treatment at 5 years: 94.6% distant metastasis free interval for patients treated with chemotherapy  Treatment summary: 1.Adjuvant chemotherapy with dose dense Adriamycin and Cytoxan x4 followed by Taxol weekly x6 (stopped for neuropathy)07/23/2017 to 11/10/2017 Herceptin adjuvant treatment started 12/29/2017 2.followed by radiation [place]:Completed 02/22/2018 3.Followed by adjuvant antiestrogen therapy with Anastrozole 80m daily x5 to 7 years ----------------------------------------------------------------------- Repeat assessment revealed HER-2 negative focus but also HER-2 positivefocus making her tumor heterogeneous.  Current treatment: Herceptinmaintenance therapy,anastrozole started 03/23/2018 She completed Herceptin 12/21/2018  Herceptin toxicities: Tolerating well.  Letrozole toxicities: Unable to bear weight on [place]: Patient stopped letrozole   She saw her radiation oncologist who ordered a brain MRI and a mammogram on the right breast. Apparently both of these were normal. She had a CT of the chest done for lung nodules which are stable.  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 on Bumex.  Chemo-induced peripheral  neuropathy:On Lyrica because the symptoms are continuing to persist.   [place] [facility]

## 2019-05-05 LAB — THYROID PANEL WITH TSH
Free Thyroxine Index: 1.8 (ref 1.2–4.9)
T3 Uptake Ratio: 27 % (ref 24–39)
T4, Total: 6.6 ug/dL (ref 4.5–12.0)
TSH: 0.654 u[IU]/mL (ref 0.450–4.500)

## 2019-05-10 NOTE — Progress Notes (Signed)
HEMATOLOGY-ONCOLOGY TELEPHONE VISIT PROGRESS NOTE  I connected with Breanna [name] on 05/11/2019 at 11:00 AM EST by telephone and verified that I am speaking with the correct person using two identifiers.  I discussed the limitations, risks, security and privacy concerns of performing an evaluation and management service by telephone and the availability of in person appointments.  I also discussed with the patient that there may be a patient responsible charge related to this service. The patient expressed understanding and agreed to proceed.   History of Present Illness: Breanna [name] is a 74 y.o. female with above-mentioned history of HER-2 positive breast cancer treated with left mastectomy, adjuvant chemotherapy, radiation, Herceptin maintenance, and is currently on letrozole. She presents to the clinic todayto discuss her symptoms.    Oncology History  Malignant neoplasm of upper-outer quadrant of left breast in female, estrogen receptor positive ([place])  05/13/2017 Initial Diagnosis   [place] biopsy: Left breast biopsy 2 o'clock position 8 cm from nipple ultrasound-guided biopsy: Grade 2 IDC, ER 50%, PR 3%, HER-2 equivocal Ki-67 15% nipple area biopsy grade 2 IDC, ER 100% positive, PR negative, HER-2 equivocal, Ki-67 40%   05/31/2017 Breast MRI   Left breast UOQ 230 position: 2 x 1.9 x 1.9 cm mass, second mass left breast UOQ 230 position: 1 x 1 x 0.7 cm, 2 more enhancing adjacent 3 and 5 mm nodules slightly superiorly midway between the 2 masses.  No abnormal lymph nodes, right breast normal   06/11/2017 Surgery   Left Mastectomy: 2 foci of IDC 2.2 and 1 cm Grade 2, 1/2 LN Positive; Grade 2 IDC, ER 50%, PR 3%, HER-2 equivocal Ki-67 15% nipple area biopsy grade 2 IDC, ER 100% positive, PR negative, HER-2 equivocal, Ki-67 40% T2N1a Stage 1B   06/11/2017 Miscellaneous   MammaPrint results reveal a high risk luminal type B with a 94.6% predicted benefit of treatment at 5 years with both  chemotherapy and hormonal therapy.     07/23/2017 - 11/15/2017 Chemotherapy   DOXOrubicin (ADRIAMYCIN) chemo injection 106 mg, 50 mg/m2 = 106 mg (83.3 % of original dose 60 mg/m2), Intravenous,  Once, 4 of 4 cycles. Dose modification: 50 mg/m2 (original dose 60 mg/m2, Cycle 1, Reason: Provider Judgment), 50 mg/m2 (original dose 60 mg/m2, Cycle 2, Reason: Provider Judgment). Administration: 106 mg (07/23/2017), 106 mg (08/11/2017), 106 mg (08/25/2017), 106 mg (09/08/2017)  palonosetron (ALOXI) injection 0.25 mg, 0.25 mg, Intravenous,  Once, 4 of 4 cycles. Administration: 0.25 mg (07/23/2017), 0.25 mg (08/11/2017), 0.25 mg (08/25/2017), 0.25 mg (09/08/2017)  pegfilgrastim (NEULASTA ONPRO KIT) injection 6 mg, 6 mg, Subcutaneous, Once, 3 of 3 cycles. Administration: 6 mg (08/11/2017), 6 mg (08/25/2017), 6 mg (09/08/2017)  pegfilgrastim-cbqv (UDENYCA) injection 6 mg, 6 mg, Subcutaneous, Once, 1 of 1 cycle. Administration: 6 mg (07/26/2017).   cyclophosphamide (CYTOXAN) 1,060 mg in sodium chloride 0.9 % 250 mL chemo infusion, 500 mg/m2 = 1,060 mg (83.3 % of original dose 600 mg/m2), Intravenous,  Once, 4 of 4 cycles. Dose modification: 500 mg/m2 (original dose 600 mg/m2, Cycle 1, Reason: Provider Judgment), 500 mg/m2 (original dose 600 mg/m2, Cycle 2, Reason: Provider Judgment). Administration: 1,060 mg (07/23/2017), 1,060 mg (08/11/2017), 1,060 mg (08/25/2017), 1,060 mg (09/08/2017)  PACLitaxel (TAXOL) 168 mg in sodium chloride 0.9 % 250 mL chemo infusion (</= '80mg'$ /m2), 80 mg/m2 = 168 mg, Intravenous,  Once, 7 of 12 cycles. Dose modification: 60 mg/m2 (original dose 80 mg/m2, Cycle 9, Reason: Dose not tolerated), 50 mg/m2 (original dose 80 mg/m2, Cycle  10, Reason: Dose not tolerated), 40 mg/m2 (original dose 40 mg/m2, Cycle 11, Reason: Provider Judgment). Administration: 168 mg (09/22/2017), 168 mg (10/13/2017), 168 mg (10/20/2017), 126 mg (10/27/2017), 108 mg (11/04/2017), 84 mg (11/10/2017)  fosaprepitant (EMEND) 150 mg, dexamethasone  (DECADRON) 12 mg in sodium chloride 0.9 % 145 mL IVPB, , Intravenous,  Once, 4 of 4 cycles. Administration:  (07/23/2017),  (08/11/2017),  (08/25/2017),  (09/08/2017)  Tbo-Filgrastim (GRANIX) injection 480 mcg, 480 mcg (100 % of original dose 480 mcg), Subcutaneous,  Once, 3 of 8 cycles. Dose modification: 480 mcg (original dose 480 mcg, Cycle 9), 480 mcg (original dose 480 mcg, Cycle 9). Administration: 480 mcg (11/02/2017), 480 mcg (11/06/2017), 480 mcg (11/15/2017)  for chemotherapy treatment.   trastuzumab (HERCEPTIN) 798 mg in sodium chloride 0.9 % 250 mL chemo infusion, 8 mg/kg = 798 mg, Intravenous,  Once, 17 of 17 cycles Administration: 798 mg (12/29/2017), 588 mg (01/19/2018), 600 mg (03/23/2018), 600 mg (05/04/2018), 600 mg (05/26/2018), 600 mg (06/14/2018), 600 mg (07/06/2018), 600 mg (07/27/2018), 600 mg (08/17/2018), 600 mg (09/07/2018), 600 mg (09/28/2018), 600 mg (10/19/2018), 600 mg (11/09/2018), 600 mg (11/30/2018), 600 mg (12/26/2018), 600 mg (02/09/2018), 600 mg (03/03/2018)  for chemotherapy treatment.    12/10/2017 Pathology Results   Repeat analysis of pathology confirmed that patient has heterogeneous HER-2 signaling.  There is equivocal IHC 2+ and fish negative areas where the ratio was 1.49 and copy number of 2.9.  There is one area that is HER-2 +3+ by [facility]   01/05/2018 - 02/22/2018 Radiation Therapy   Adj XRT at [facility] Gwendlyn Deutscher, MD) (1) 50 Gy in 25 fractions to the Left Axilla (2) 30 Gy in 15 fractions (3) 20 Gy in 10 fractions (4) Boost: 10 Gy in 5 fractions   03/2018 - 03/2025 Anti-estrogen oral therapy   Anastrozole, '1mg'$  daily stopped due to itching; switched to letrozole 06/2018     Observations/Objective:     Assessment Plan:  Malignant neoplasm of upper-outer quadrant of left breast in female, estrogen receptor positive ([place]) 06/11/17:Left Mastectomy: 2 foci of IDC 2.2 and 1 cm Grade 2, 1/2 LN Positive; Grade 2 IDC, ER 50%, PR 3%, HER-2 equivocal Ki-67 15% nipple area biopsy  grade 2 IDC, ER 100% positive, PR negative, HER-2 positive Ki-67 40% T2N1a Stage 1B  Treatment summary: 1.Adjuvant chemotherapy with dose dense Adriamycin and Cytoxan x4 followed by Taxol weekly x6 (stopped for neuropathy)07/23/2017 to 11/10/2017 Herceptin adjuvant treatment started 12/29/2017 completed 12/21/2018 2.followed by radiation [place]:Completed 02/22/2018 3.Followed by adjuvant antiestrogen therapy with Letrozole '1mg'$  daily x5 to 7 years ----------------------------------------------------------------------- Letrozole toxicities: 1.  Severe hot flashes: On Paxil 20 mg daily. Getting beter 2.  Frequent yeast infections: I sent a prescription for Diflucan for 14 days.  Encouraged her to take probiotics. 3.  Severe leg pains: Valtrex and mobic improved the pain 4.  Severe fatigue: Continues to be a problem. Labs were normal.  TSH 0.65 On a diet. Lost 16 lbs Numbness in thigh improved. She is planning to get the COVID-19 vaccine  I discussed the assessment and treatment plan with the patient. The patient was provided an opportunity to ask questions and all were answered. The patient agreed with the plan and demonstrated an understanding of the instructions. The patient was advised to call back or seek an in-person evaluation if the symptoms worsen or if the condition fails to improve as anticipated.   I provided 11 minutes of non-face-to-face time during this encounter.   Rulon Eisenmenger, MD  05/11/2019    I, Molly Dorshimer, am acting as Education administrator for Nicholas Lose, MD.  I have reviewed the above documentation for accuracy and completeness, and I agree with the above.

## 2019-05-11 ENCOUNTER — Inpatient Hospital Stay: Payer: Medicare Other | Attending: Adult Health | Admitting: Hematology and Oncology

## 2019-05-11 DIAGNOSIS — C50412 Malignant neoplasm of upper-outer quadrant of left female breast: Secondary | ICD-10-CM

## 2019-05-11 DIAGNOSIS — Z170 Estrogen receptor positive status [ER+]: Secondary | ICD-10-CM

## 2019-05-11 NOTE — Assessment & Plan Note (Addendum)
06/11/17:Left Mastectomy: 2 foci of IDC 2.2 and 1 cm Grade 2, 1/2 LN Positive; Grade 2 IDC, ER 50%, PR 3%, HER-2 equivocal Ki-67 15% nipple area biopsy grade 2 IDC, ER 100% positive, PR negative, HER-2 positive Ki-67 40% T2N1a Stage 1B Treatment summary: 1.Adjuvant chemotherapy with dose dense Adriamycin and Cytoxan x4 followed by Taxol weekly x6 (stopped for neuropathy)07/23/2017 to 11/10/2017 Herceptin adjuvant treatment started 12/29/2017 completed 12/21/2018 2.followed by radiation [place]:Completed 02/22/2018 3.Followed by adjuvant antiestrogen therapy with Anastrozole 30m daily x5 to 7 years ----------------------------------------------------------------------- Letrozole toxicities: 1.  Severe hot flashes: On Paxil 20 mg daily 2.  Frequent yeast infections: I sent a prescription for Diflucan for 14 days.  Encouraged her to take probiotics. 3.  Severe leg pains: Uncertain if it is related to the recurrent episode of shingles.  If in a week her symptoms do not get up with Valtrex then we may have to perform an MRI of the back. 4.  Severe fatigue: Labs were normal.  TSH 0.65

## 2019-05-12 ENCOUNTER — Telehealth: Payer: Self-pay | Admitting: Hematology and Oncology

## 2019-05-12 NOTE — Telephone Encounter (Signed)
I talk with patient regarding phone visit  °

## 2019-05-15 ENCOUNTER — Encounter: Payer: Self-pay | Admitting: Hematology and Oncology

## 2019-05-16 ENCOUNTER — Other Ambulatory Visit: Payer: Self-pay

## 2019-05-16 MED ORDER — VALACYCLOVIR HCL 1 G PO TABS: 1000.0000 mg | ORAL_TABLET | Freq: Two times a day (BID) | ORAL | 0 refills | Status: AC

## 2019-05-29 NOTE — Progress Notes (Signed)
HEMATOLOGY-ONCOLOGY TELEPHONE VISIT PROGRESS NOTE  I connected with Breanna [name] on 05/30/2019 at  2:00 PM EDT by telephone and verified that I am speaking with the correct person using two identifiers.  I discussed the limitations, risks, security and privacy concerns of performing an evaluation and management service by telephone and the availability of in person appointments.  I also discussed with the patient that there may be a patient responsible charge related to this service. The patient expressed understanding and agreed to proceed.   History of Present Illness: Breanna [name] is a 74 y.o. female with above-mentioned history of HER-2 positive breast cancer treated with left mastectomy, adjuvant chemotherapy, radiation, Herceptin maintenance, and is currently on letrozole.She presents over the phone todayfor follow-up.  She reports remarkable improvement in her symptoms related to the shingles the numbness the fatigue the yeast infection.  She has bilateral heel pain for which she is getting primary care assessments with x-rays.  She had a history of lung nodules for which she is seeing pulmonary.  They do not think it requires an immediate biopsy but rather watching and monitoring it.  Oncology History  Malignant neoplasm of upper-outer quadrant of left breast in female, estrogen receptor positive ([place])  05/13/2017 Initial Diagnosis   [place] biopsy: Left breast biopsy 2 o'clock position 8 cm from nipple ultrasound-guided biopsy: Grade 2 IDC, ER 50%, PR 3%, HER-2 equivocal Ki-67 15% nipple area biopsy grade 2 IDC, ER 100% positive, PR negative, HER-2 equivocal, Ki-67 40%   05/31/2017 Breast MRI   Left breast UOQ 230 position: 2 x 1.9 x 1.9 cm mass, second mass left breast UOQ 230 position: 1 x 1 x 0.7 cm, 2 more enhancing adjacent 3 and 5 mm nodules slightly superiorly midway between the 2 masses.  No abnormal lymph nodes, right breast normal   06/11/2017 Surgery   Left  Mastectomy: 2 foci of IDC 2.2 and 1 cm Grade 2, 1/2 LN Positive; Grade 2 IDC, ER 50%, PR 3%, HER-2 equivocal Ki-67 15% nipple area biopsy grade 2 IDC, ER 100% positive, PR negative, HER-2 equivocal, Ki-67 40% T2N1a Stage 1B   06/11/2017 Miscellaneous   MammaPrint results reveal a high risk luminal type B with a 94.6% predicted benefit of treatment at 5 years with both chemotherapy and hormonal therapy.     07/23/2017 - 11/15/2017 Chemotherapy   DOXOrubicin (ADRIAMYCIN) chemo injection 106 mg, 50 mg/m2 = 106 mg (83.3 % of original dose 60 mg/m2), Intravenous,  Once, 4 of 4 cycles. Dose modification: 50 mg/m2 (original dose 60 mg/m2, Cycle 1, Reason: Provider Judgment), 50 mg/m2 (original dose 60 mg/m2, Cycle 2, Reason: Provider Judgment). Administration: 106 mg (07/23/2017), 106 mg (08/11/2017), 106 mg (08/25/2017), 106 mg (09/08/2017)  palonosetron (ALOXI) injection 0.25 mg, 0.25 mg, Intravenous,  Once, 4 of 4 cycles. Administration: 0.25 mg (07/23/2017), 0.25 mg (08/11/2017), 0.25 mg (08/25/2017), 0.25 mg (09/08/2017)  pegfilgrastim (NEULASTA ONPRO KIT) injection 6 mg, 6 mg, Subcutaneous, Once, 3 of 3 cycles. Administration: 6 mg (08/11/2017), 6 mg (08/25/2017), 6 mg (09/08/2017)  pegfilgrastim-cbqv (UDENYCA) injection 6 mg, 6 mg, Subcutaneous, Once, 1 of 1 cycle. Administration: 6 mg (07/26/2017).   cyclophosphamide (CYTOXAN) 1,060 mg in sodium chloride 0.9 % 250 mL chemo infusion, 500 mg/m2 = 1,060 mg (83.3 % of original dose 600 mg/m2), Intravenous,  Once, 4 of 4 cycles. Dose modification: 500 mg/m2 (original dose 600 mg/m2, Cycle 1, Reason: Provider Judgment), 500 mg/m2 (original dose 600 mg/m2, Cycle 2, Reason: Provider Judgment). Administration:  1,060 mg (07/23/2017), 1,060 mg (08/11/2017), 1,060 mg (08/25/2017), 1,060 mg (09/08/2017)  PACLitaxel (TAXOL) 168 mg in sodium chloride 0.9 % 250 mL chemo infusion (</= 88m/m2), 80 mg/m2 = 168 mg, Intravenous,  Once, 7 of 12 cycles. Dose modification: 60 mg/m2 (original dose  80 mg/m2, Cycle 9, Reason: Dose not tolerated), 50 mg/m2 (original dose 80 mg/m2, Cycle 10, Reason: Dose not tolerated), 40 mg/m2 (original dose 40 mg/m2, Cycle 11, Reason: Provider Judgment). Administration: 168 mg (09/22/2017), 168 mg (10/13/2017), 168 mg (10/20/2017), 126 mg (10/27/2017), 108 mg (11/04/2017), 84 mg (11/10/2017)  fosaprepitant (EMEND) 150 mg, dexamethasone (DECADRON) 12 mg in sodium chloride 0.9 % 145 mL IVPB, , Intravenous,  Once, 4 of 4 cycles. Administration:  (07/23/2017),  (08/11/2017),  (08/25/2017),  (09/08/2017)  Tbo-Filgrastim (GRANIX) injection 480 mcg, 480 mcg (100 % of original dose 480 mcg), Subcutaneous,  Once, 3 of 8 cycles. Dose modification: 480 mcg (original dose 480 mcg, Cycle 9), 480 mcg (original dose 480 mcg, Cycle 9). Administration: 480 mcg (11/02/2017), 480 mcg (11/06/2017), 480 mcg (11/15/2017)  for chemotherapy treatment.   trastuzumab (HERCEPTIN) 798 mg in sodium chloride 0.9 % 250 mL chemo infusion, 8 mg/kg = 798 mg, Intravenous,  Once, 17 of 17 cycles Administration: 798 mg (12/29/2017), 588 mg (01/19/2018), 600 mg (03/23/2018), 600 mg (05/04/2018), 600 mg (05/26/2018), 600 mg (06/14/2018), 600 mg (07/06/2018), 600 mg (07/27/2018), 600 mg (08/17/2018), 600 mg (09/07/2018), 600 mg (09/28/2018), 600 mg (10/19/2018), 600 mg (11/09/2018), 600 mg (11/30/2018), 600 mg (12/26/2018), 600 mg (02/09/2018), 600 mg (03/03/2018)  for chemotherapy treatment.    12/10/2017 Pathology Results   Repeat analysis of pathology confirmed that patient has heterogeneous HER-2 signaling.  There is equivocal IHC 2+ and fish negative areas where the ratio was 1.49 and copy number of 2.9.  There is one area that is HER-2 +3+ [facility]  01/05/2018 - 02/22/2018 Radiation Therapy   [facility] (1) 50 Gy in 25 fractions to the Left Axilla (2) 30 Gy in 15 fractions (3) 20 Gy in 10 fractions (4) Boost: 10 Gy in 5 fractions   03/2018 - 03/2025 Anti-estrogen oral therapy   Anastrozole, 126m daily stopped due to itching; switched to letrozole 06/2018     Observations/Objective:  Marked improvement in her signs and symptoms.   Assessment Plan:  Malignant neoplasm of upper-outer quadrant of left breast in female, estrogen receptor positive [place]:Left Mastectomy: 2 foci of IDC 2.2 and 1 cm Grade 2, 1/2 LN Positive; Grade 2 IDC, ER 50%, PR 3%, HER-2 equivocal Ki-67 15% nipple area biopsy grade 2 IDC, ER 100% positive, PR negative, HER-2 positive Ki-67 40% T2N1a Stage 1B  Treatment summary: 1.Adjuvant chemotherapy with dose dense Adriamycin and Cytoxan x4 followed by Taxol weekly x6 (stopped for neuropathy)07/23/2017 to 11/10/2017 Herceptin adjuvant treatment started 12/29/2017 completed 12/21/2018 2.followed by radiation [place]:Completed 02/22/2018 3.Followed by adjuvant antiestrogen therapy with Letrozole 34m9maily x5 to 7 years ----------------------------------------------------------------------- Letrozole toxicities: 1.Severe hot flashes: On Paxil 20 mg daily. Getting beter 2.Frequent yeast infections:resolved with Diflucan. Encouraged her to take probiotics. 3.Severe leg pains:resolved with Valtrex 4.Severe fatigue:   Labs were normal.  TSH 0.65, improving 5. Heel Pain: PCP following  Lung nodules: followed by pulm.  On a diet. Lost 16 lbs Numbness in thigh: resolved   I discussed the assessment and treatment plan with the patient. The patient was provided an opportunity to ask questions and all were answered. The patient agreed with the plan  and demonstrated an understanding of the instructions. The patient was advised to call back or seek an in-person evaluation if the symptoms worsen or if the condition fails to improve as anticipated.   I provided 12 minutes of non-face-to-face time during this encounter.   Rulon Eisenmenger, MD 05/30/2019    I, Molly Dorshimer, am acting as scribe for Nicholas Lose, MD.  I have reviewed the above  documentation for accuracy and completeness, and I agree with the above.

## 2019-05-30 ENCOUNTER — Inpatient Hospital Stay (HOSPITAL_BASED_OUTPATIENT_CLINIC_OR_DEPARTMENT_OTHER): Payer: Medicare Other | Admitting: Hematology and Oncology

## 2019-05-30 DIAGNOSIS — Z170 Estrogen receptor positive status [ER+]: Secondary | ICD-10-CM | POA: Diagnosis not present

## 2019-05-30 DIAGNOSIS — C50412 Malignant neoplasm of upper-outer quadrant of left female breast: Secondary | ICD-10-CM

## 2019-05-30 NOTE — Assessment & Plan Note (Signed)
06/11/17:Left Mastectomy: 2 foci of IDC 2.2 and 1 cm Grade 2, 1/2 LN Positive; Grade 2 IDC, ER 50%, PR 3%, HER-2 equivocal Ki-67 15% nipple area biopsy grade 2 IDC, ER 100% positive, PR negative, HER-2 positive Ki-67 40% T2N1a Stage 1B  Treatment summary: 1.Adjuvant chemotherapy with dose dense Adriamycin and Cytoxan x4 followed by Taxol weekly x6 (stopped for neuropathy)07/23/2017 to 11/10/2017 Herceptin adjuvant treatment started 12/29/2017 completed 12/21/2018 2.followed by radiation [place]:Completed 02/22/2018 3.Followed by adjuvant antiestrogen therapy with Letrozole '1mg'$  daily x5 to 7 years ----------------------------------------------------------------------- Letrozole toxicities: 1.Severe hot flashes: On Paxil 20 mg daily. Getting beter 2.Frequent yeast infections: I sent a prescription for Diflucan for 14 days. Encouraged her to take probiotics. 3.Severe leg pains: Valtrex and mobic improved the pain 4.Severe fatigue:   Labs were normal.  TSH 0.65 On a diet. Lost 16 lbs Numbness in thigh improved.

## 2019-05-31 ENCOUNTER — Ambulatory Visit (INDEPENDENT_AMBULATORY_CARE_PROVIDER_SITE_OTHER): Payer: Medicare Other | Admitting: Podiatry

## 2019-05-31 ENCOUNTER — Other Ambulatory Visit: Payer: Self-pay | Admitting: Podiatry

## 2019-05-31 ENCOUNTER — Encounter: Payer: Self-pay | Admitting: Podiatry

## 2019-05-31 ENCOUNTER — Ambulatory Visit (INDEPENDENT_AMBULATORY_CARE_PROVIDER_SITE_OTHER): Payer: Medicare Other

## 2019-05-31 ENCOUNTER — Other Ambulatory Visit: Payer: Self-pay

## 2019-05-31 VITALS — BP 143/72 | HR 62 | Temp 97.8°F | Resp 16

## 2019-05-31 DIAGNOSIS — M79671 Pain in right foot: Secondary | ICD-10-CM

## 2019-05-31 DIAGNOSIS — M722 Plantar fascial fibromatosis: Secondary | ICD-10-CM

## 2019-05-31 DIAGNOSIS — B351 Tinea unguium: Secondary | ICD-10-CM

## 2019-05-31 NOTE — Patient Instructions (Signed)

## 2019-06-01 NOTE — Progress Notes (Signed)
Subjective:   Patient ID: Breanna [name], female   DOB: 74 y.o.   MRN: [id number]   HPI Patient states she is having a lot of pain in the bottom of her right heel and states is been very sore and making it hard for her to walk.  States is been present for around 3 months and the right is worse than the left currently and patient is being actively treated for cancer currently and does not smoke and would like to be more active   Review of Systems  All other systems reviewed and are negative.       Objective:  Physical Exam Vitals and nursing note reviewed.  Constitutional:      Appearance: She is well-developed.  Pulmonary:     Effort: Pulmonary effort is normal.  Musculoskeletal:        General: Normal range of motion.  Skin:    General: Skin is warm.  Neurological:     Mental Status: She is alert.     Neurovascular status intact muscle strength found to be adequate range of motion within normal limits.  Patient is found to have exquisite discomfort plantar aspect right heel at the insertional point tendon calcaneus with inflammation fluid in the medial band at its insertion into the heel bone.  Patient is found to have good digital perfusion well oriented x3     Assessment:  Acute plantar fasciitis right inflammation fluid with moderate pain also in the left with mild edema probably due to the surgery and her immobilization     Plan:  H&P conditions reviewed x-rays reviewed and today I did sterile prep injected the plantar fascia right 3 mg Kenalog 5 mg Xylocaine applied fascial brace gave instructions for stress physical therapy and reappoint to recheck  X-rays indicate small spur no indications of stress fracture or advanced arthritis in this area

## 2019-06-19 ENCOUNTER — Ambulatory Visit (INDEPENDENT_AMBULATORY_CARE_PROVIDER_SITE_OTHER): Payer: Medicare Other | Admitting: Podiatry

## 2019-06-19 ENCOUNTER — Encounter: Payer: Self-pay | Admitting: Podiatry

## 2019-06-19 ENCOUNTER — Other Ambulatory Visit: Payer: Self-pay

## 2019-06-19 DIAGNOSIS — M722 Plantar fascial fibromatosis: Secondary | ICD-10-CM

## 2019-06-21 NOTE — Progress Notes (Signed)
Subjective:   Patient ID: Breanna [name], female   DOB: 74 y.o.   MRN: [id number]   HPI Patient presents with the continuation of acute inflammation of the plantar fascial right stating that it is still very sore especially when she gets up in the morning and after periods of sitting   ROS      Objective:  Physical Exam  Neurovascular status intact with patient found to have equinus condition and does have quite a bit of inflammation of the plantar fascia at the insertion calcaneus right     Assessment:  Acute plantar fasciitis right with inflammation fluid buildup     Plan:  H&P reviewed condition and today I did sterile prep and injected the fascia 3 mg Kenalog 5 mg Xylocaine and due to the equinus and the lack of stretch I did apply an air fracture walker with instructions on sleeping with this currently and also to use it for half hour.  2 or 3 times a day along with aggressive ice which I educated her on today

## 2019-06-30 ENCOUNTER — Encounter: Payer: Self-pay | Admitting: Hematology and Oncology

## 2019-06-30 ENCOUNTER — Other Ambulatory Visit: Payer: Self-pay

## 2019-07-03 ENCOUNTER — Other Ambulatory Visit: Payer: Self-pay

## 2019-07-03 ENCOUNTER — Other Ambulatory Visit: Payer: Self-pay | Admitting: *Deleted

## 2019-07-03 MED ORDER — PROBIOTIC-PREBIOTIC 1-250 BILLION-MG PO CAPS: ORAL_CAPSULE | ORAL | 3 refills | Status: DC

## 2019-07-03 MED ORDER — PROBIOTIC DAILY PO CAPS: ORAL_CAPSULE | ORAL | 3 refills | Status: DC

## 2019-07-10 ENCOUNTER — Ambulatory Visit: Payer: No Typology Code available for payment source | Admitting: Podiatry

## 2019-09-04 ENCOUNTER — Other Ambulatory Visit: Payer: Self-pay | Admitting: Hematology and Oncology

## 2019-09-12 ENCOUNTER — Other Ambulatory Visit: Payer: Self-pay | Admitting: *Deleted

## 2019-09-12 MED ORDER — BUMETANIDE 1 MG PO TABS: 1.0000 mg | ORAL_TABLET | Freq: Two times a day (BID) | ORAL | 3 refills | Status: DC

## 2019-09-27 ENCOUNTER — Telehealth: Payer: Self-pay | Admitting: Adult Health

## 2019-09-27 NOTE — Telephone Encounter (Signed)
I spoke with the patient today on the phone. The patient requested her appointment with Wilber Bihari on 9/28 to be cancelled. She declined to have the appointment rescheduled at this time.

## 2019-11-29 ENCOUNTER — Other Ambulatory Visit: Payer: Self-pay | Admitting: *Deleted

## 2019-11-29 DIAGNOSIS — C50412 Malignant neoplasm of upper-outer quadrant of left female breast: Secondary | ICD-10-CM

## 2019-12-05 ENCOUNTER — Ambulatory Visit: Payer: Medicare Other | Admitting: Adult Health

## 2020-01-13 IMAGING — CR DG CHEST 2V
2 series · 2 of 2 positions shown · non-contrast
Comparison: 12/27/2017

CLINICAL DATA: Renal and bladder infection since [REDACTED]. Patient
was given medication that conflicts with her chemotherapy treatment.
Patient has been progressively worsening and has a fever.

EXAM:
CHEST - 2 VIEW

[chest pa]
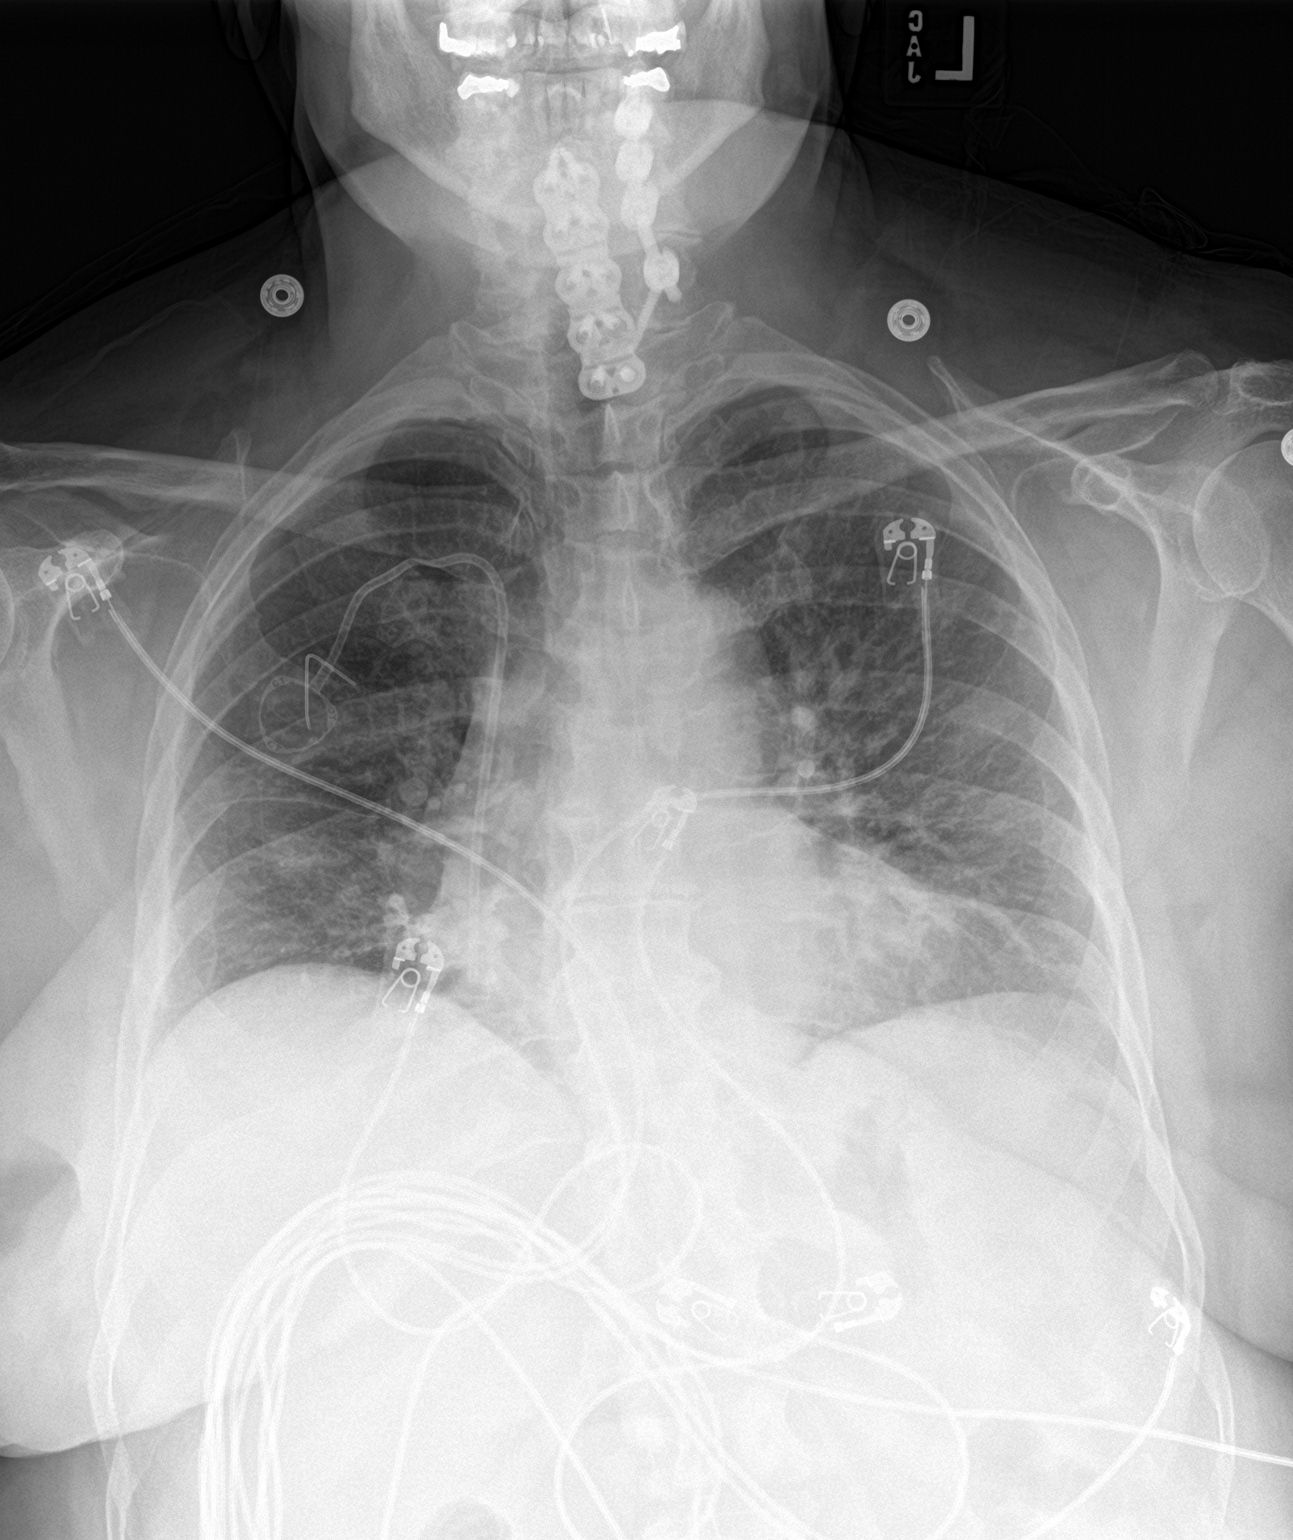

[chest lat]
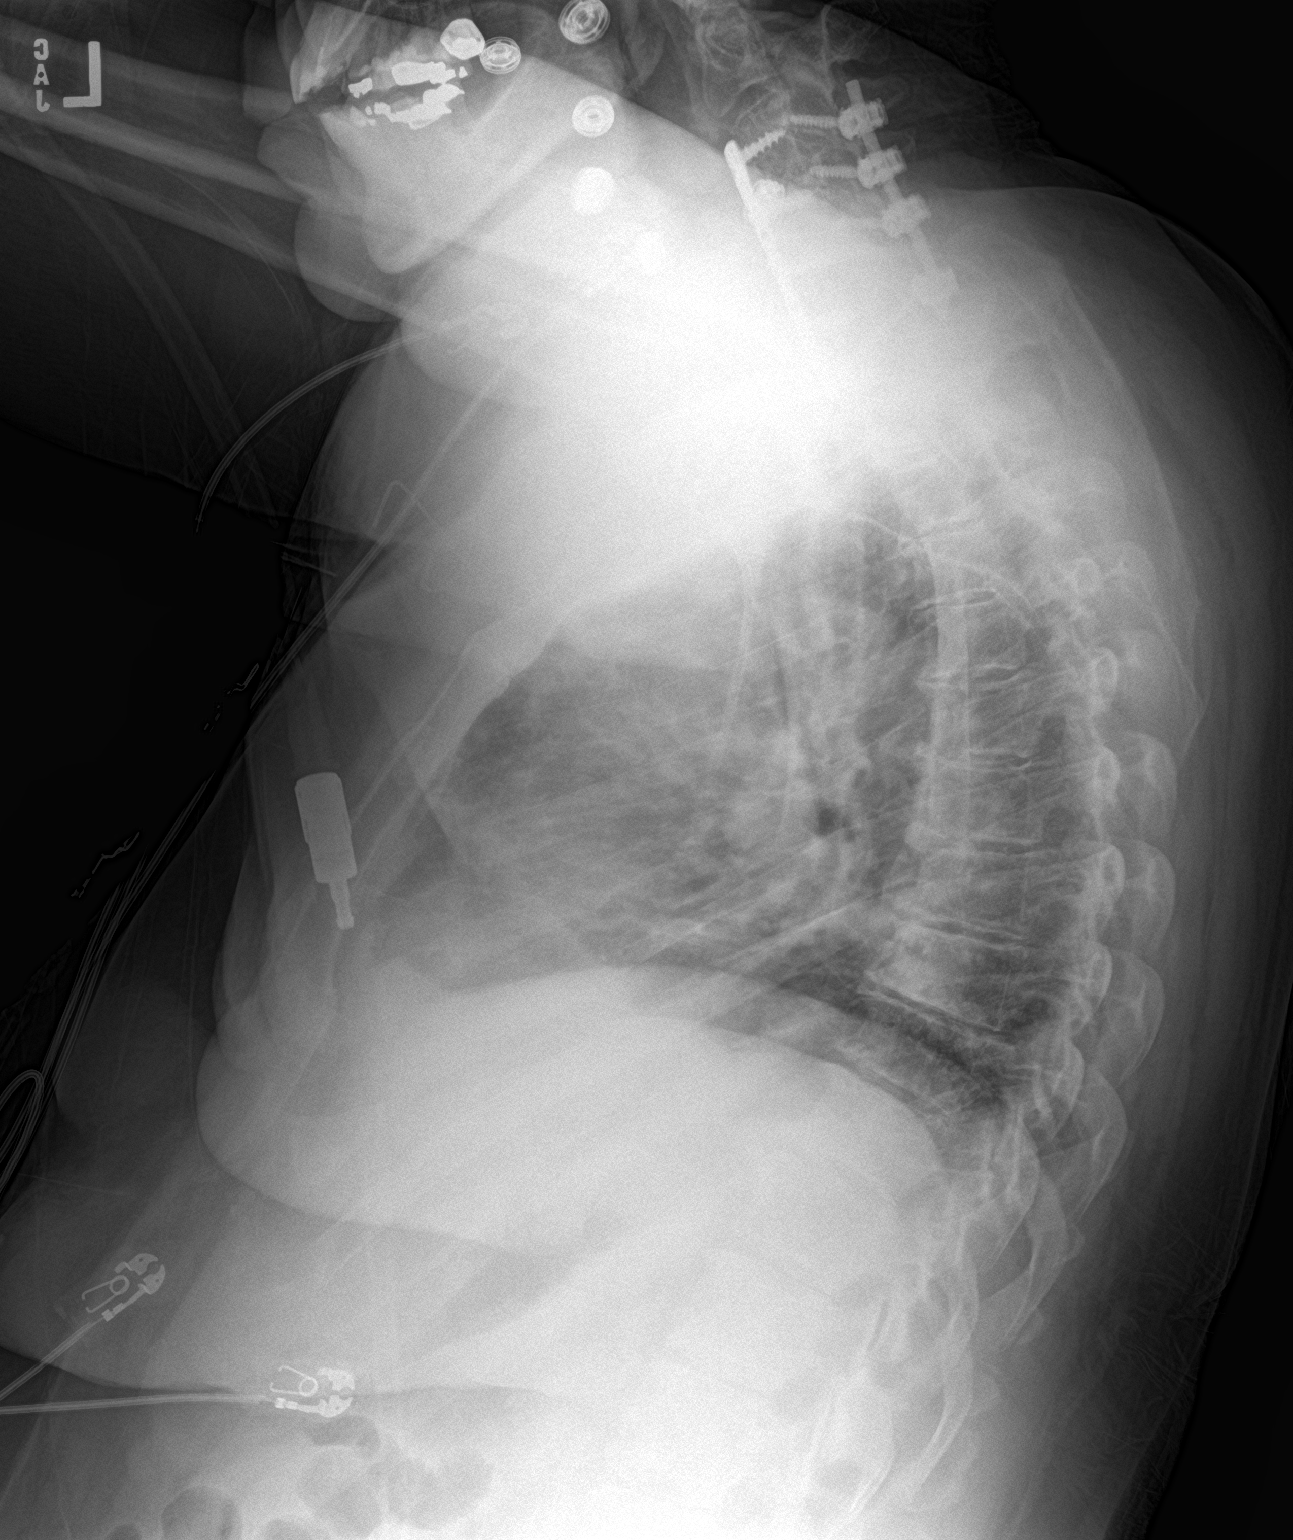

[2 of 2 positions shown; findings below may reference images not displayed]

FINDINGS: Low lung volumes are noted with diffuse interstitial prominence
suspicious for mild interstitial edema. No alveolar consolidations
are identified. Right port catheter tip terminates in the right
atrium with slight kinking as it passes beneath the medial right
clavicle. This is similar in appearance however to prior. Minimal
aortic atherosclerosis is noted. ACDFand cervical spinal fusion
hardware are stable in appearance.
IMPRESSION: Low lung volumes with mild interstitial edema.

## 2020-01-17 ENCOUNTER — Emergency Department (HOSPITAL_COMMUNITY): Payer: Medicare Other

## 2020-01-17 ENCOUNTER — Other Ambulatory Visit: Payer: Self-pay

## 2020-01-17 ENCOUNTER — Emergency Department (HOSPITAL_COMMUNITY)
Admission: EM | Admit: 2020-01-17 | Discharge: 2020-01-17 | Disposition: A | Discharge status: Home / Self Care | Payer: Medicare Other | Attending: Emergency Medicine | Admitting: Emergency Medicine

## 2020-01-17 ENCOUNTER — Encounter (HOSPITAL_COMMUNITY): Payer: Self-pay | Admitting: Emergency Medicine

## 2020-01-17 DIAGNOSIS — R519 Headache, unspecified: Secondary | ICD-10-CM | POA: Diagnosis present

## 2020-01-17 DIAGNOSIS — Z87891 Personal history of nicotine dependence: Secondary | ICD-10-CM | POA: Insufficient documentation

## 2020-01-17 DIAGNOSIS — Z8673 Personal history of transient ischemic attack (TIA), and cerebral infarction without residual deficits: Secondary | ICD-10-CM | POA: Insufficient documentation

## 2020-01-17 DIAGNOSIS — J449 Chronic obstructive pulmonary disease, unspecified: Secondary | ICD-10-CM | POA: Diagnosis not present

## 2020-01-17 DIAGNOSIS — Z853 Personal history of malignant neoplasm of breast: Secondary | ICD-10-CM | POA: Diagnosis not present

## 2020-01-17 DIAGNOSIS — Z96652 Presence of left artificial knee joint: Secondary | ICD-10-CM | POA: Insufficient documentation

## 2020-01-17 DIAGNOSIS — Z9012 Acquired absence of left breast and nipple: Secondary | ICD-10-CM | POA: Insufficient documentation

## 2020-01-17 DIAGNOSIS — I10 Essential (primary) hypertension: Secondary | ICD-10-CM | POA: Insufficient documentation

## 2020-01-17 DIAGNOSIS — Z79899 Other long term (current) drug therapy: Secondary | ICD-10-CM | POA: Insufficient documentation

## 2020-01-17 DIAGNOSIS — I160 Hypertensive urgency: Secondary | ICD-10-CM | POA: Diagnosis not present

## 2020-01-17 LAB — COMPREHENSIVE METABOLIC PANEL
ALT: 26 U/L (ref 0–44)
AST: 29 U/L (ref 15–41)
Albumin: 4.3 g/dL (ref 3.5–5.0)
Alkaline Phosphatase: 83 U/L (ref 38–126)
Anion gap: 10 (ref 5–15)
BUN: 11 mg/dL (ref 8–23)
CO2: 31 mmol/L (ref 22–32)
Calcium: 9.5 mg/dL (ref 8.9–10.3)
Chloride: 98 mmol/L (ref 98–111)
Creatinine, Ser: 0.69 mg/dL (ref 0.44–1.00)
GFR, Estimated: >60 mL/min (ref >60)
Glucose, Bld: 118 mg/dL — ABNORMAL HIGH (ref 70–99)
Potassium: 3.5 mmol/L (ref 3.5–5.1)
Sodium: 139 mmol/L (ref 135–145)
Total Bilirubin: 0.7 mg/dL (ref 0.3–1.2)
Total Protein: 7.3 g/dL (ref 6.5–8.1)

## 2020-01-17 LAB — ETHANOL: Alcohol, Ethyl (B): <10 mg/dL (ref <10)

## 2020-01-17 LAB — CBC WITH DIFFERENTIAL/PLATELET
Abs Immature Granulocytes: 0.01 10*3/uL (ref 0.00–0.07)
Basophils Absolute: 0 10*3/uL (ref 0.0–0.1)
Basophils Relative: 1 %
Eosinophils Absolute: 0.1 10*3/uL (ref 0.0–0.5)
Eosinophils Relative: 2 %
HCT: 46 % (ref 36.0–46.0)
Hemoglobin: 15.2 g/dL — ABNORMAL HIGH (ref 12.0–15.0)
Immature Granulocytes: 0 %
Lymphocytes Relative: 29 %
Lymphs Abs: 1.2 10*3/uL (ref 0.7–4.0)
MCH: 32.6 pg (ref 26.0–34.0)
MCHC: 33 g/dL (ref 30.0–36.0)
MCV: 98.7 fL (ref 80.0–100.0)
Monocytes Absolute: 0.4 10*3/uL (ref 0.1–1.0)
Monocytes Relative: 10 %
Neutro Abs: 2.4 10*3/uL (ref 1.7–7.7)
Neutrophils Relative %: 58 %
Platelets: 193 10*3/uL (ref 150–400)
RBC: 4.66 MIL/uL (ref 3.87–5.11)
RDW: 12.6 % (ref 11.5–15.5)
WBC: 4.2 10*3/uL (ref 4.0–10.5)
nRBC: 0 % (ref 0.0–0.2)

## 2020-01-17 LAB — URINALYSIS, ROUTINE W REFLEX MICROSCOPIC
Bilirubin Urine: NEGATIVE
Glucose, UA: NEGATIVE mg/dL
Hgb urine dipstick: NEGATIVE
Ketones, ur: NEGATIVE mg/dL
Leukocytes,Ua: NEGATIVE
Nitrite: NEGATIVE
Protein, ur: NEGATIVE mg/dL
Specific Gravity, Urine: 1.005 (ref 1.005–1.030)
pH: 6 (ref 5.0–8.0)

## 2020-01-17 MED ORDER — SODIUM CHLORIDE 0.9 % IV BOLUS
1000.0000 mL | Freq: Once | INTRAVENOUS | Status: AC
  Administered 2020-01-17: 1000 mL via INTRAVENOUS

## 2020-01-17 MED ORDER — DIPHENHYDRAMINE HCL 50 MG/ML IJ SOLN
25.0000 mg | Freq: Once | INTRAMUSCULAR | Status: AC
  Administered 2020-01-17: 25 mg via INTRAVENOUS
  Filled 2020-01-17: qty 1

## 2020-01-17 MED ORDER — METOCLOPRAMIDE HCL 5 MG/ML IJ SOLN
10.0000 mg | Freq: Once | INTRAMUSCULAR | Status: AC
  Administered 2020-01-17: 10 mg via INTRAVENOUS
  Filled 2020-01-17: qty 2

## 2020-01-17 NOTE — ED Triage Notes (Signed)
Pt c/o of HTN. Pt woke up with a migraine this morning. BP 178/76

## 2020-01-17 NOTE — Discharge Instructions (Addendum)
As discussed, your evaluation today has been largely reassuring.  But, it is important that you monitor your condition carefully, and do not hesitate to return to the ED if you develop new, or concerning changes in your condition. ? ?Otherwise, please follow-up with your physician for appropriate ongoing care. ? ?
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
## 2020-01-17 NOTE — ED Notes (Signed)
Pt dc ambulating to front lobby with husband.  Pt verbalized understanding of care and follow up.

## 2020-03-23 ENCOUNTER — Emergency Department (HOSPITAL_COMMUNITY)
Admission: EM | Admit: 2020-03-23 | Discharge: 2020-03-23 | Disposition: A | Discharge status: Home / Self Care | Payer: Medicare Other | Attending: Emergency Medicine | Admitting: Emergency Medicine

## 2020-03-23 ENCOUNTER — Other Ambulatory Visit: Payer: Self-pay

## 2020-03-23 ENCOUNTER — Encounter (HOSPITAL_COMMUNITY): Payer: Self-pay | Admitting: Emergency Medicine

## 2020-03-23 DIAGNOSIS — R531 Weakness: Secondary | ICD-10-CM | POA: Insufficient documentation

## 2020-03-23 DIAGNOSIS — Z5321 Procedure and treatment not carried out due to patient leaving prior to being seen by health care provider: Secondary | ICD-10-CM | POA: Diagnosis not present

## 2020-03-23 NOTE — ED Triage Notes (Addendum)
Pt has has covid symptoms that began last Sunday.  Pt was advised about the area test sites and the test kits available at local pharmacies.

## 2020-05-03 ENCOUNTER — Emergency Department (HOSPITAL_COMMUNITY)
Admission: EM | Admit: 2020-05-03 | Discharge: 2020-05-03 | Disposition: A | Discharge status: Home / Self Care | Payer: Medicare Other | Attending: Emergency Medicine | Admitting: Emergency Medicine

## 2020-05-03 ENCOUNTER — Encounter (HOSPITAL_COMMUNITY): Payer: Self-pay

## 2020-05-03 ENCOUNTER — Other Ambulatory Visit: Payer: Self-pay

## 2020-05-03 DIAGNOSIS — Z79899 Other long term (current) drug therapy: Secondary | ICD-10-CM | POA: Insufficient documentation

## 2020-05-03 DIAGNOSIS — I10 Essential (primary) hypertension: Secondary | ICD-10-CM | POA: Diagnosis not present

## 2020-05-03 DIAGNOSIS — Z96652 Presence of left artificial knee joint: Secondary | ICD-10-CM | POA: Insufficient documentation

## 2020-05-03 DIAGNOSIS — Z87891 Personal history of nicotine dependence: Secondary | ICD-10-CM | POA: Diagnosis not present

## 2020-05-03 DIAGNOSIS — J449 Chronic obstructive pulmonary disease, unspecified: Secondary | ICD-10-CM | POA: Insufficient documentation

## 2020-05-03 DIAGNOSIS — Z853 Personal history of malignant neoplasm of breast: Secondary | ICD-10-CM | POA: Insufficient documentation

## 2020-05-03 DIAGNOSIS — L509 Urticaria, unspecified: Secondary | ICD-10-CM | POA: Insufficient documentation

## 2020-05-03 DIAGNOSIS — R21 Rash and other nonspecific skin eruption: Secondary | ICD-10-CM | POA: Diagnosis present

## 2020-05-03 DIAGNOSIS — Z7951 Long term (current) use of inhaled steroids: Secondary | ICD-10-CM | POA: Diagnosis not present

## 2020-05-03 MED ORDER — FAMOTIDINE IN NACL 20-0.9 MG/50ML-% IV SOLN
20.0000 mg | Freq: Once | INTRAVENOUS | Status: AC
  Administered 2020-05-03: 20 mg via INTRAVENOUS
  Filled 2020-05-03: qty 50

## 2020-05-03 MED ORDER — METHYLPREDNISOLONE SODIUM SUCC 125 MG IJ SOLR
125.0000 mg | Freq: Once | INTRAMUSCULAR | Status: AC
  Administered 2020-05-03: 125 mg via INTRAVENOUS
  Filled 2020-05-03: qty 2

## 2020-05-03 MED ORDER — DIPHENHYDRAMINE HCL 50 MG/ML IJ SOLN
25.0000 mg | Freq: Once | INTRAMUSCULAR | Status: AC
  Administered 2020-05-03: 25 mg via INTRAVENOUS
  Filled 2020-05-03: qty 1

## 2020-05-03 NOTE — ED Triage Notes (Signed)
Pt given valtrex from urgent care in [place] due to vaginal shingles. Pt reports rash to arms, legs and back since last night. Pt states rash itches. Scratching all night

## 2020-05-03 NOTE — Discharge Instructions (Addendum)
Benadryl 25 mg every 4 hours for the next 2 days.  Return if symptoms worsen or change. Stop valrex

## 2020-05-03 NOTE — ED Notes (Signed)
Pt rash appears to be better following medications.

## 2020-05-04 ENCOUNTER — Emergency Department (HOSPITAL_COMMUNITY)
Admission: EM | Admit: 2020-05-04 | Discharge: 2020-05-04 | Disposition: A | Discharge status: Home / Self Care | Payer: Medicare Other | Attending: Emergency Medicine | Admitting: Emergency Medicine

## 2020-05-04 ENCOUNTER — Other Ambulatory Visit: Payer: Self-pay

## 2020-05-04 ENCOUNTER — Encounter (HOSPITAL_COMMUNITY): Payer: Self-pay | Admitting: *Deleted

## 2020-05-04 DIAGNOSIS — Z96652 Presence of left artificial knee joint: Secondary | ICD-10-CM | POA: Diagnosis not present

## 2020-05-04 DIAGNOSIS — Z7902 Long term (current) use of antithrombotics/antiplatelets: Secondary | ICD-10-CM | POA: Insufficient documentation

## 2020-05-04 DIAGNOSIS — L509 Urticaria, unspecified: Secondary | ICD-10-CM | POA: Diagnosis present

## 2020-05-04 DIAGNOSIS — J449 Chronic obstructive pulmonary disease, unspecified: Secondary | ICD-10-CM | POA: Diagnosis not present

## 2020-05-04 DIAGNOSIS — Z79899 Other long term (current) drug therapy: Secondary | ICD-10-CM | POA: Insufficient documentation

## 2020-05-04 DIAGNOSIS — Z853 Personal history of malignant neoplasm of breast: Secondary | ICD-10-CM | POA: Diagnosis not present

## 2020-05-04 DIAGNOSIS — I10 Essential (primary) hypertension: Secondary | ICD-10-CM | POA: Insufficient documentation

## 2020-05-04 DIAGNOSIS — Z87891 Personal history of nicotine dependence: Secondary | ICD-10-CM | POA: Insufficient documentation

## 2020-05-04 MED ORDER — FAMOTIDINE 20 MG PO TABS: 20.0000 mg | ORAL_TABLET | Freq: Two times a day (BID) | ORAL | 0 refills | Status: DC

## 2020-05-04 MED ORDER — PREDNISONE 20 MG PO TABS: 40.0000 mg | ORAL_TABLET | Freq: Every day | ORAL | 0 refills | Status: AC

## 2020-05-04 MED ORDER — LORATADINE 10 MG PO TABS: 10.0000 mg | ORAL_TABLET | Freq: Every day | ORAL | 0 refills | Status: DC

## 2020-05-04 MED ORDER — EPINEPHRINE 0.3 MG/0.3ML IJ SOAJ: 0.3000 mg | INTRAMUSCULAR | 0 refills | Status: DC | PRN
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
## 2020-05-04 NOTE — ED Triage Notes (Signed)
Seen yesterday for hives, states it is worse today

## 2020-05-04 NOTE — Discharge Instructions (Addendum)
Suspect you maybe suffering from an allergic reaction from your medication.  I want you to stop taking it.  Given you a prescription for steroids please take as prescribed.  Also given you a prescription for Pepcid and Claritin please take as prescribed.  These medications will help decrease itchiness and rash.  Also given you a EpiPen I want you to use if you develop tongue or throat swelling, difficulty breathing, as these are signs of anaphylactic reaction.  Given you instructions on how to use.  If you use your EpiPen you must come back to the emergency department for further evaluation.  Please follow-up with your primary care provider in 1 week's time for reevaluation.  Vital signs have remained stable, no indication for hospital admission.  Patient discussed with attending and they agreed with assessment and plan.  Patient given at home care as well strict return precautions.  Patient verbalized that they understood agreed to said plan.
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
## 2020-06-13 ENCOUNTER — Other Ambulatory Visit: Payer: Self-pay | Admitting: Hematology and Oncology

## 2020-06-14 ENCOUNTER — Other Ambulatory Visit: Payer: Self-pay | Admitting: Hematology and Oncology

## 2020-06-14 MED ORDER — TRAMADOL HCL 50 MG PO TABS: ORAL_TABLET | ORAL | 0 refills | Status: DC

## 2020-11-04 ENCOUNTER — Other Ambulatory Visit: Payer: Self-pay | Admitting: Hematology and Oncology

## 2021-05-26 ENCOUNTER — Other Ambulatory Visit: Payer: Self-pay | Admitting: Hematology and Oncology

## 2021-05-28 ENCOUNTER — Other Ambulatory Visit: Payer: Self-pay | Admitting: Hematology and Oncology

## 2021-06-10 ENCOUNTER — Other Ambulatory Visit: Payer: Self-pay | Admitting: Hematology and Oncology

## 2021-06-12 ENCOUNTER — Encounter: Payer: Self-pay | Admitting: Internal Medicine

## 2021-07-16 ENCOUNTER — Ambulatory Visit: Payer: Medicare Other | Admitting: Internal Medicine

## 2021-07-30 ENCOUNTER — Other Ambulatory Visit (HOSPITAL_COMMUNITY): Payer: Self-pay | Admitting: Nurse Practitioner

## 2021-07-30 ENCOUNTER — Other Ambulatory Visit (HOSPITAL_BASED_OUTPATIENT_CLINIC_OR_DEPARTMENT_OTHER): Payer: Self-pay | Admitting: Nurse Practitioner

## 2021-07-30 ENCOUNTER — Ambulatory Visit (INDEPENDENT_AMBULATORY_CARE_PROVIDER_SITE_OTHER): Payer: Medicare Other | Admitting: Internal Medicine

## 2021-07-30 ENCOUNTER — Encounter: Payer: Self-pay | Admitting: Internal Medicine

## 2021-07-30 ENCOUNTER — Other Ambulatory Visit: Payer: Self-pay | Admitting: Nurse Practitioner

## 2021-07-30 VITALS — BP 146/67 | HR 68 | Temp 97.0°F | Ht 65.0 in | Wt 216.2 lb

## 2021-07-30 DIAGNOSIS — R0789 Other chest pain: Secondary | ICD-10-CM

## 2021-07-30 DIAGNOSIS — K219 Gastro-esophageal reflux disease without esophagitis: Secondary | ICD-10-CM | POA: Diagnosis not present

## 2021-07-30 DIAGNOSIS — R152 Fecal urgency: Secondary | ICD-10-CM

## 2021-07-30 DIAGNOSIS — R197 Diarrhea, unspecified: Secondary | ICD-10-CM

## 2021-07-30 DIAGNOSIS — I10 Essential (primary) hypertension: Secondary | ICD-10-CM

## 2021-07-30 DIAGNOSIS — R140 Abdominal distension (gaseous): Secondary | ICD-10-CM | POA: Diagnosis not present

## 2021-07-30 DIAGNOSIS — Z86000 Personal history of in-situ neoplasm of breast: Secondary | ICD-10-CM

## 2021-07-30 NOTE — Patient Instructions (Signed)
I am going to request your records including EGD and colonoscopy from [facility].  I want you to start taking Imodium 1 tablet 15 to 20 minutes before breakfast and dinner.  If this does not adequately prevent diarrhea, you can increase to 2 tablets.  I am going to check stool studies at [facility] to rule out infectious causes of your diarrhea.  I am also going to check you for celiac disease which is a blood test to rule out gluten sensitivity.  I am going to give you information regarding a low FODMAP diet.  We may consider trying you on a medication called Viberzi after I have reviewed all of your records if Imodium is not adequate.  Follow-up with GI in 2 to 3 months.  It was very nice meeting you today.  Dr. Abbey Chatters.  At [facility] we value your feedback. You may receive a survey about your visit today. Please share your experience as we strive to create trusting relationships with our patients to provide genuine, compassionate, quality care.  We appreciate your understanding and patience as we review any laboratory studies, imaging, and other diagnostic tests that are ordered as we care for you. Our office policy is 5 business days for review of these results, and any emergent or urgent results are addressed in a timely manner for your best interest. If you do not hear from our office in 1 week, please contact us.   We also encourage the use of MyChart, which contains your medical information for your review as well. If you are not enrolled in this feature, an access code is on this after visit summary for your convenience. Thank you for allowing [place] to be involved in your care.  It was great to see you today!  I hope you have a great rest of your Spring!    Elon Alas. Abbey Chatters, D.O. Gastroenterology and Hepatology [facility]
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
## 2021-08-01 ENCOUNTER — Ambulatory Visit (HOSPITAL_COMMUNITY)
Admission: RE | Admit: 2021-08-01 | Discharge: 2021-08-01 | Disposition: A | Discharge status: Home / Self Care | Payer: Medicare Other | Source: Ambulatory Visit | Attending: Nurse Practitioner | Admitting: Nurse Practitioner

## 2021-08-01 DIAGNOSIS — I10 Essential (primary) hypertension: Secondary | ICD-10-CM | POA: Diagnosis present

## 2021-08-04 LAB — CELIAC AB TTG DGP TIGA
Antigliadin Abs, IgA: 4 units (ref 0–19)
Gliadin IgG: 2 units (ref 0–19)
IgA/Immunoglobulin A, Serum: 167 mg/dL (ref 64–422)
Tissue Transglut Ab: <2 U/mL (ref 0–5)
Transglutaminase IgA: <2 U/mL (ref 0–3)

## 2021-08-04 LAB — GI PROFILE, STOOL, PCR

## 2021-08-05 ENCOUNTER — Ambulatory Visit (HOSPITAL_COMMUNITY)
Admission: RE | Admit: 2021-08-05 | Discharge: 2021-08-05 | Disposition: A | Discharge status: Home / Self Care | Payer: Medicare Other | Source: Ambulatory Visit | Attending: Nurse Practitioner | Admitting: Nurse Practitioner

## 2021-08-05 ENCOUNTER — Other Ambulatory Visit (HOSPITAL_BASED_OUTPATIENT_CLINIC_OR_DEPARTMENT_OTHER): Payer: Medicare Other

## 2021-08-05 DIAGNOSIS — Z86000 Personal history of in-situ neoplasm of breast: Secondary | ICD-10-CM | POA: Insufficient documentation

## 2021-08-05 DIAGNOSIS — R0789 Other chest pain: Secondary | ICD-10-CM | POA: Insufficient documentation

## 2021-08-05 LAB — POCT I-STAT CREATININE: Creatinine, Ser: 0.8 mg/dL (ref 0.44–1.00)

## 2021-08-05 MED ORDER — IOHEXOL 300 MG/ML  SOLN
75.0000 mL | Freq: Once | INTRAMUSCULAR | Status: AC | PRN
  Administered 2021-08-05: 80 mL via INTRAVENOUS

## 2021-08-05 MED ORDER — SODIUM CHLORIDE (PF) 0.9 % IJ SOLN
INTRAMUSCULAR | Status: AC
  Filled 2021-08-05: qty 50

## 2021-08-11 ENCOUNTER — Other Ambulatory Visit: Payer: Self-pay | Admitting: Hematology and Oncology

## 2021-08-11 ENCOUNTER — Encounter: Payer: Self-pay | Admitting: Internal Medicine

## 2021-08-30 ENCOUNTER — Other Ambulatory Visit: Payer: Self-pay

## 2021-08-30 ENCOUNTER — Emergency Department (HOSPITAL_COMMUNITY)
Admission: EM | Admit: 2021-08-30 | Discharge: 2021-08-31 | Disposition: A | Discharge status: Home / Self Care | Payer: Medicare Other | Attending: Emergency Medicine | Admitting: Emergency Medicine

## 2021-08-30 ENCOUNTER — Emergency Department (HOSPITAL_COMMUNITY): Payer: Medicare Other

## 2021-08-30 DIAGNOSIS — M79604 Pain in right leg: Secondary | ICD-10-CM | POA: Diagnosis not present

## 2021-08-30 DIAGNOSIS — J449 Chronic obstructive pulmonary disease, unspecified: Secondary | ICD-10-CM | POA: Insufficient documentation

## 2021-08-30 DIAGNOSIS — Z923 Personal history of irradiation: Secondary | ICD-10-CM | POA: Diagnosis not present

## 2021-08-30 DIAGNOSIS — G629 Polyneuropathy, unspecified: Secondary | ICD-10-CM

## 2021-08-30 DIAGNOSIS — Z9221 Personal history of antineoplastic chemotherapy: Secondary | ICD-10-CM | POA: Insufficient documentation

## 2021-08-30 DIAGNOSIS — I10 Essential (primary) hypertension: Secondary | ICD-10-CM | POA: Insufficient documentation

## 2021-08-30 DIAGNOSIS — Z79899 Other long term (current) drug therapy: Secondary | ICD-10-CM | POA: Insufficient documentation

## 2021-08-30 DIAGNOSIS — Z7902 Long term (current) use of antithrombotics/antiplatelets: Secondary | ICD-10-CM | POA: Insufficient documentation

## 2021-08-30 DIAGNOSIS — Z853 Personal history of malignant neoplasm of breast: Secondary | ICD-10-CM | POA: Diagnosis not present

## 2021-08-30 DIAGNOSIS — G6289 Other specified polyneuropathies: Secondary | ICD-10-CM | POA: Diagnosis not present

## 2021-08-30 DIAGNOSIS — Z1321 Encounter for screening for nutritional disorder: Secondary | ICD-10-CM | POA: Diagnosis not present

## 2021-08-30 DIAGNOSIS — Z9012 Acquired absence of left breast and nipple: Secondary | ICD-10-CM | POA: Insufficient documentation

## 2021-08-30 DIAGNOSIS — R200 Anesthesia of skin: Secondary | ICD-10-CM | POA: Diagnosis present

## 2021-08-30 LAB — DIFFERENTIAL
Abs Immature Granulocytes: 0.03 10*3/uL (ref 0.00–0.07)
Basophils Absolute: 0 10*3/uL (ref 0.0–0.1)
Basophils Relative: 1 %
Eosinophils Absolute: 0.1 10*3/uL (ref 0.0–0.5)
Eosinophils Relative: 2 %
Immature Granulocytes: 1 %
Lymphocytes Relative: 28 %
Lymphs Abs: 1.5 10*3/uL (ref 0.7–4.0)
Monocytes Absolute: 0.6 10*3/uL (ref 0.1–1.0)
Monocytes Relative: 11 %
Neutro Abs: 3.1 10*3/uL (ref 1.7–7.7)
Neutrophils Relative %: 57 %

## 2021-08-30 LAB — CBC
HCT: 39.7 % (ref 36.0–46.0)
Hemoglobin: 13.2 g/dL (ref 12.0–15.0)
MCH: 32.8 pg (ref 26.0–34.0)
MCHC: 33.2 g/dL (ref 30.0–36.0)
MCV: 98.5 fL (ref 80.0–100.0)
Platelets: 177 10*3/uL (ref 150–400)
RBC: 4.03 MIL/uL (ref 3.87–5.11)
RDW: 13.2 % (ref 11.5–15.5)
WBC: 5.4 10*3/uL (ref 4.0–10.5)
nRBC: 0 % (ref 0.0–0.2)

## 2021-08-30 LAB — COMPREHENSIVE METABOLIC PANEL
ALT: 27 U/L (ref 0–44)
AST: 26 U/L (ref 15–41)
Albumin: 3.6 g/dL (ref 3.5–5.0)
Alkaline Phosphatase: 59 U/L (ref 38–126)
Anion gap: 9 (ref 5–15)
BUN: 14 mg/dL (ref 8–23)
CO2: 31 mmol/L (ref 22–32)
Calcium: 9.5 mg/dL (ref 8.9–10.3)
Chloride: 102 mmol/L (ref 98–111)
Creatinine, Ser: 1.02 mg/dL — ABNORMAL HIGH (ref 0.44–1.00)
GFR, Estimated: 57 mL/min — ABNORMAL LOW (ref >60)
Glucose, Bld: 122 mg/dL — ABNORMAL HIGH (ref 70–99)
Potassium: 3.9 mmol/L (ref 3.5–5.1)
Sodium: 142 mmol/L (ref 135–145)
Total Bilirubin: 0.6 mg/dL (ref 0.3–1.2)
Total Protein: 6.2 g/dL — ABNORMAL LOW (ref 6.5–8.1)

## 2021-08-30 LAB — PROTIME-INR
INR: 1 (ref 0.8–1.2)
Prothrombin Time: 13.4 s (ref 11.4–15.2)

## 2021-08-30 LAB — I-STAT CHEM 8, ED
BUN: 15 mg/dL (ref 8–23)
Calcium, Ion: 1.17 mmol/L (ref 1.15–1.40)
Chloride: 100 mmol/L (ref 98–111)
Creatinine, Ser: 1 mg/dL (ref 0.44–1.00)
Glucose, Bld: 119 mg/dL — ABNORMAL HIGH (ref 70–99)
HCT: 40 % (ref 36.0–46.0)
Hemoglobin: 13.6 g/dL (ref 12.0–15.0)
Potassium: 3.8 mmol/L (ref 3.5–5.1)
Sodium: 140 mmol/L (ref 135–145)
TCO2: 29 mmol/L (ref 22–32)

## 2021-08-30 LAB — ETHANOL: Alcohol, Ethyl (B): <10 mg/dL (ref <10)

## 2021-08-30 LAB — APTT: aPTT: 28 seconds (ref 24–36)

## 2021-08-30 NOTE — ED Triage Notes (Signed)
Pt arrives with multiple complaints: Wednesday pt reports cold/numb feeling in both her feet. That feeling has progressed up to her knees now. This morning her eyes are sensitive to the light and her pupils were both very small. Pt has back pain onset this morning without injury or over exertion. Took Tylenol and tramadol at home with some relief. Also c/o sob but this has been ongoing x 6 months and she has just started wearing supplemental O2 and the sob is no worse than normal.

## 2021-08-30 NOTE — ED Notes (Signed)
Pt able to walk to the bathroom with no assistance.
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
## 2021-08-31 LAB — TSH: TSH: 0.637 u[IU]/mL (ref 0.350–4.500)

## 2021-08-31 LAB — FOLATE: Folate: 18.7 ng/mL (ref >5.9)

## 2021-08-31 LAB — VITAMIN D 25 HYDROXY (VIT D DEFICIENCY, FRACTURES): Vit D, 25-Hydroxy: 59.84 ng/mL (ref 30–100)

## 2021-08-31 LAB — VITAMIN B12: Vitamin B-12: 523 pg/mL (ref 180–914)

## 2021-08-31 MED ORDER — LIDOCAINE 5 % EX OINT: 1.0000 | TOPICAL_OINTMENT | CUTANEOUS | 0 refills | Status: AC | PRN

## 2021-08-31 MED ORDER — LIDOCAINE 5 % EX OINT: 1.0000 | TOPICAL_OINTMENT | CUTANEOUS | 0 refills | Status: DC | PRN

## 2021-09-02 LAB — PROTEIN ELECTROPHORESIS, SERUM
A/G Ratio: 1.5 (ref 0.7–1.7)
Albumin ELP: 3.5 g/dL (ref 2.9–4.4)
Alpha-1-Globulin: 0.2 g/dL (ref 0.0–0.4)
Alpha-2-Globulin: 0.7 g/dL (ref 0.4–1.0)
Beta Globulin: 0.9 g/dL (ref 0.7–1.3)
Gamma Globulin: 0.7 g/dL (ref 0.4–1.8)
Globulin, Total: 2.4 g/dL (ref 2.2–3.9)
Total Protein ELP: 5.9 g/dL — ABNORMAL LOW (ref 6.0–8.5)

## 2021-09-02 LAB — VITAMIN B6: Vitamin B6: 73.2 ug/L — ABNORMAL HIGH (ref 3.4–65.2)

## 2021-09-06 ENCOUNTER — Other Ambulatory Visit: Payer: Self-pay | Admitting: Nurse Practitioner

## 2021-09-15 ENCOUNTER — Other Ambulatory Visit: Payer: Self-pay | Admitting: Hematology and Oncology

## 2021-09-29 ENCOUNTER — Ambulatory Visit: Payer: Medicare Other | Admitting: Gastroenterology
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
## 2021-10-01 ENCOUNTER — Encounter: Payer: Self-pay | Admitting: Gastroenterology

## 2021-10-01 ENCOUNTER — Ambulatory Visit (INDEPENDENT_AMBULATORY_CARE_PROVIDER_SITE_OTHER): Payer: Medicare Other | Admitting: Gastroenterology

## 2021-10-01 VITALS — BP 102/51 | HR 66 | Temp 98.0°F | Ht 64.0 in | Wt 208.4 lb

## 2021-10-01 DIAGNOSIS — K580 Irritable bowel syndrome with diarrhea: Secondary | ICD-10-CM | POA: Diagnosis not present

## 2021-10-01 DIAGNOSIS — R152 Fecal urgency: Secondary | ICD-10-CM

## 2021-10-01 DIAGNOSIS — R140 Abdominal distension (gaseous): Secondary | ICD-10-CM

## 2021-10-01 DIAGNOSIS — K219 Gastro-esophageal reflux disease without esophagitis: Secondary | ICD-10-CM | POA: Diagnosis not present

## 2021-10-01 NOTE — Patient Instructions (Addendum)
We will trail Viberzi 75 mg daily, I am providing you with some samples today.  Please call with a progress report after you have completed the samples and I can send in prescription for you.  If there is a delay in getting the medication continue your current regimen of Lactaid pills along with Imodium prior to meals.  You can stop taking the dicyclomine.  If this medication is not helpful we may can consider testing for SIBO and/or ruling out any gallbladder or pancreatic cause of your symptoms.  It is likely that with your abdominal bloating and pain that you have associated with your diarrhea that this is IBS.  Continue lactaid with meals over this next week and then continue to take if you are eating anything in your meals that contain milk.  As you stated that you like to eat solids, many salad dressings have ingredients that contain milk.  Please pay attention to these especially.  A low FODMAP diet could be very beneficial as well.  I am attaching information about this to your paperwork today.  For the gas that you have please avoid common gas producing foods:  (eg, cabbage, legumes, onions, broccoli, brussel sprouts, wheat, and potatoes).   I will have you follow-up in 3 months to assess how you are doing.   It was a pleasure to see you today. I want to create trusting relationships with patients. If you receive a survey regarding your visit,  I greatly appreciate you taking time to fill this out on paper or through your MyChart. I value your feedback.  Venetia Night, MSN, FNP-BC, AGACNP-BC [facility]

## 2021-10-20 ENCOUNTER — Telehealth: Payer: Self-pay

## 2021-10-20 NOTE — Telephone Encounter (Signed)
Pt was given samples of viberzi 75 mg by Loma Sousa at her last visit and was told to call and let her know if it worked and she would call in a formal rx. Pt states that it works and would like for an rx to be sent to [facility] in [place] .

## 2021-10-30 ENCOUNTER — Telehealth: Payer: Self-pay | Admitting: Gastroenterology

## 2021-10-30 MED ORDER — VIBERZI 75 MG PO TABS: 1.0000 | ORAL_TABLET | Freq: Every day | ORAL | 3 refills | Status: DC

## 2021-10-30 NOTE — Telephone Encounter (Signed)
Noted. Prescription was faxed to [facility]

## 2021-10-30 NOTE — Telephone Encounter (Signed)
Asked by Dr. Abbey Chatters to send in Champ.  Patient last saw [facility].  I have printed prescription for Viberzi 75 mg daily. Please fax to pharmacy. Thanks, Demetrius Charity!  Leeanne Deed.

## 2021-11-05 ENCOUNTER — Other Ambulatory Visit (INDEPENDENT_AMBULATORY_CARE_PROVIDER_SITE_OTHER): Payer: Self-pay

## 2021-11-05 ENCOUNTER — Telehealth: Payer: Self-pay

## 2021-11-05 NOTE — Telephone Encounter (Signed)
PA for Viberzi Tablet 75 MG was approved from 10/30/2021 until further notice. Approval letter to be scanned into patient's chart.

## 2021-11-24 ENCOUNTER — Emergency Department (HOSPITAL_COMMUNITY): Payer: Medicare Other

## 2021-11-24 ENCOUNTER — Other Ambulatory Visit: Payer: Self-pay

## 2021-11-24 ENCOUNTER — Encounter (HOSPITAL_COMMUNITY): Payer: Self-pay

## 2021-11-24 ENCOUNTER — Telehealth: Payer: Self-pay | Admitting: Internal Medicine

## 2021-11-24 ENCOUNTER — Emergency Department (HOSPITAL_COMMUNITY)
Admission: EM | Admit: 2021-11-24 | Discharge: 2021-11-25 | Disposition: A | Discharge status: Home / Self Care | Payer: Medicare Other | Attending: Emergency Medicine | Admitting: Emergency Medicine

## 2021-11-24 ENCOUNTER — Encounter: Payer: Self-pay | Admitting: *Deleted

## 2021-11-24 DIAGNOSIS — R0602 Shortness of breath: Secondary | ICD-10-CM | POA: Diagnosis present

## 2021-11-24 DIAGNOSIS — Z853 Personal history of malignant neoplasm of breast: Secondary | ICD-10-CM | POA: Diagnosis not present

## 2021-11-24 DIAGNOSIS — Z7902 Long term (current) use of antithrombotics/antiplatelets: Secondary | ICD-10-CM | POA: Insufficient documentation

## 2021-11-24 DIAGNOSIS — I10 Essential (primary) hypertension: Secondary | ICD-10-CM | POA: Diagnosis not present

## 2021-11-24 DIAGNOSIS — R609 Edema, unspecified: Secondary | ICD-10-CM

## 2021-11-24 DIAGNOSIS — Z79899 Other long term (current) drug therapy: Secondary | ICD-10-CM | POA: Diagnosis not present

## 2021-11-24 DIAGNOSIS — R600 Localized edema: Secondary | ICD-10-CM | POA: Diagnosis not present

## 2021-11-24 LAB — COMPREHENSIVE METABOLIC PANEL
ALT: 20 U/L (ref 0–44)
AST: 24 U/L (ref 15–41)
Albumin: 3.9 g/dL (ref 3.5–5.0)
Alkaline Phosphatase: 63 U/L (ref 38–126)
Anion gap: 6 (ref 5–15)
BUN: 12 mg/dL (ref 8–23)
CO2: 30 mmol/L (ref 22–32)
Calcium: 9 mg/dL (ref 8.9–10.3)
Chloride: 104 mmol/L (ref 98–111)
Creatinine, Ser: 0.71 mg/dL (ref 0.44–1.00)
GFR, Estimated: >60 mL/min (ref >60)
Glucose, Bld: 101 mg/dL — ABNORMAL HIGH (ref 70–99)
Potassium: 3.9 mmol/L (ref 3.5–5.1)
Sodium: 140 mmol/L (ref 135–145)
Total Bilirubin: 0.5 mg/dL (ref 0.3–1.2)
Total Protein: 7 g/dL (ref 6.5–8.1)

## 2021-11-24 LAB — D-DIMER, QUANTITATIVE: D-Dimer, Quant: 1.97 ug/mL-FEU — ABNORMAL HIGH (ref 0.00–0.50)

## 2021-11-24 LAB — CBC WITH DIFFERENTIAL/PLATELET
Abs Immature Granulocytes: 0.01 10*3/uL (ref 0.00–0.07)
Basophils Absolute: 0 10*3/uL (ref 0.0–0.1)
Basophils Relative: 1 %
Eosinophils Absolute: 0.2 10*3/uL (ref 0.0–0.5)
Eosinophils Relative: 3 %
HCT: 37.4 % (ref 36.0–46.0)
Hemoglobin: 12.4 g/dL (ref 12.0–15.0)
Immature Granulocytes: 0 %
Lymphocytes Relative: 26 %
Lymphs Abs: 1.3 10*3/uL (ref 0.7–4.0)
MCH: 32.6 pg (ref 26.0–34.0)
MCHC: 33.2 g/dL (ref 30.0–36.0)
MCV: 98.4 fL (ref 80.0–100.0)
Monocytes Absolute: 0.6 10*3/uL (ref 0.1–1.0)
Monocytes Relative: 12 %
Neutro Abs: 2.8 10*3/uL (ref 1.7–7.7)
Neutrophils Relative %: 58 %
Platelets: 194 10*3/uL (ref 150–400)
RBC: 3.8 MIL/uL — ABNORMAL LOW (ref 3.87–5.11)
RDW: 13 % (ref 11.5–15.5)
WBC: 4.9 10*3/uL (ref 4.0–10.5)
nRBC: 0 % (ref 0.0–0.2)

## 2021-11-24 LAB — BRAIN NATRIURETIC PEPTIDE: B Natriuretic Peptide: 81 pg/mL (ref 0.0–100.0)

## 2021-11-24 LAB — TROPONIN I (HIGH SENSITIVITY): Troponin I (High Sensitivity): 7 ng/L (ref <18)

## 2021-11-24 MED ORDER — ALBUTEROL (5 MG/ML) CONTINUOUS INHALATION SOLN
10.0000 mg | INHALATION_SOLUTION | RESPIRATORY_TRACT | Status: AC
  Filled 2021-11-24: qty 20

## 2021-11-24 MED ORDER — ALBUTEROL SULFATE (2.5 MG/3ML) 0.083% IN NEBU
INHALATION_SOLUTION | RESPIRATORY_TRACT | Status: AC
  Administered 2021-11-24: 10 mg
  Filled 2021-11-24: qty 12

## 2021-11-24 MED ORDER — ALBUTEROL SULFATE HFA 108 (90 BASE) MCG/ACT IN AERS: 2.0000 | INHALATION_SPRAY | RESPIRATORY_TRACT | Status: DC | PRN

## 2021-11-24 MED ORDER — IOHEXOL 350 MG/ML SOLN
100.0000 mL | Freq: Once | INTRAVENOUS | Status: AC | PRN
  Administered 2021-11-24: 100 mL via INTRAVENOUS

## 2021-11-24 NOTE — ED Notes (Signed)
ED Provider at bedside. 

## 2021-11-24 NOTE — ED Provider Notes (Signed)
Care assumed from Dr. Sabra Heck, patient with shortness of breath and positive d-dimer, pending CT angiogram of the chest.  CT angiogram shows no evidence of pulmonary embolism or other pulmonary abnormalities.  I have independently viewed the images, and agree with radiologist interpretation.  Patient is advised of these findings and is discharged with referral to pulmonology for outpatient work-up.  Results for orders placed or performed during the hospital encounter of 11/24/21  CBC with Differential  Result Value Ref Range   WBC 4.9 4.0 - 10.5 K/uL   RBC 3.80 (L) 3.87 - 5.11 MIL/uL   Hemoglobin 12.4 12.0 - 15.0 g/dL   HCT 37.4 36.0 - 46.0 %   MCV 98.4 80.0 - 100.0 fL   MCH 32.6 26.0 - 34.0 pg   MCHC 33.2 30.0 - 36.0 g/dL   RDW 13.0 11.5 - 15.5 %   Platelets 194 150 - 400 K/uL   nRBC 0.0 0.0 - 0.2 %   Neutrophils Relative % 58 %   Neutro Abs 2.8 1.7 - 7.7 K/uL   Lymphocytes Relative 26 %   Lymphs Abs 1.3 0.7 - 4.0 K/uL   Monocytes Relative 12 %   Monocytes Absolute 0.6 0.1 - 1.0 K/uL   Eosinophils Relative 3 %   Eosinophils Absolute 0.2 0.0 - 0.5 K/uL   Basophils Relative 1 %   Basophils Absolute 0.0 0.0 - 0.1 K/uL   Immature Granulocytes 0 %   Abs Immature Granulocytes 0.01 0.00 - 0.07 K/uL  Comprehensive metabolic panel  Result Value Ref Range   Sodium 140 135 - 145 mmol/L   Potassium 3.9 3.5 - 5.1 mmol/L   Chloride 104 98 - 111 mmol/L   CO2 30 22 - 32 mmol/L   Glucose, Bld 101 (H) 70 - 99 mg/dL   BUN 12 8 - 23 mg/dL   Creatinine, Ser 0.71 0.44 - 1.00 mg/dL   Calcium 9.0 8.9 - 10.3 mg/dL   Total Protein 7.0 6.5 - 8.1 g/dL   Albumin 3.9 3.5 - 5.0 g/dL   AST 24 15 - 41 U/L   ALT 20 0 - 44 U/L   Alkaline Phosphatase 63 38 - 126 U/L   Total Bilirubin 0.5 0.3 - 1.2 mg/dL   GFR, Estimated >60 >60 mL/min   Anion gap 6 5 - 15  D-dimer, quantitative  Result Value Ref Range   D-Dimer, Quant 1.97 (H) 0.00 - 0.50 ug/mL-FEU  Brain natriuretic peptide  Result Value Ref Range    B Natriuretic Peptide 81.0 0.0 - 100.0 pg/mL  Troponin I (High Sensitivity)  Result Value Ref Range   Troponin I (High Sensitivity) 7 <18 ng/L   CT Angio Chest PE W and/or Wo Contrast  Result Date: 11/24/2021 CLINICAL DATA:  Shortness of breath for few weeks EXAM: CT ANGIOGRAPHY CHEST WITH CONTRAST TECHNIQUE: Multidetector CT imaging of the chest was performed using the standard protocol during bolus administration of intravenous contrast. Multiplanar CT image reconstructions and MIPs were obtained to evaluate the vascular anatomy. RADIATION DOSE REDUCTION: This exam was performed according to the departmental dose-optimization program which includes automated exposure control, adjustment of the mA and/or kV according to patient size and/or use of iterative reconstruction technique. CONTRAST:  175m OMNIPAQUE IOHEXOL 350 MG/ML SOLN COMPARISON:  Chest x-ray from earlier in the same day. FINDINGS: Cardiovascular: Thoracic aorta demonstrates atherosclerotic calcifications without aneurysmal dilatation. Mild cardiac enlargement is noted. Coronary calcifications are noted. The pulmonary artery shows a normal branching pattern bilaterally. No filling defect to  suggest pulmonary embolism is seen. Mediastinum/Nodes: Thoracic inlet is within normal limits. The esophagus as visualized is within normal limits. Multiple small hilar nodes are seen bilaterally. These are likely reactive in nature. Lungs/Pleura: Lungs are well aerated bilaterally. Mild emphysematous changes are seen. No focal infiltrate or sizable effusion is noted. Upper Abdomen: Visualized upper abdomen shows no acute abnormality. Musculoskeletal: Changes of prior left mastectomy are noted. Postsurgical changes in the cervical spine are seen. Degenerative changes of the thoracic spine are noted. No rib abnormality is seen. Review of the MIP images confirms the above findings. IMPRESSION: No evidence of pulmonary emboli. No other focal abnormality is  noted. Aortic Atherosclerosis (ICD10-I70.0) and Emphysema (ICD10-J43.9). Electronically Signed   By: Inez Catalina M.D.   On: 11/24/2021 23:48   DG Chest 2 View  Result Date: 11/24/2021 CLINICAL DATA:  Pt to ED with c/o SOB. Hx of breast ca in left breast post mastectomy, HTN. EXAM: CHEST - 2 VIEW COMPARISON:  Chest x-ray 04/09/2018, CT chest 08/05/2021 FINDINGS: The heart and mediastinal contours are unchanged. Atherosclerotic plaque of the aorta. No focal consolidation. Chronic coarsened markings with possibly slightly increased interstitial markings in the upper lobes. No pleural effusion. No pneumothorax. No acute osseous abnormality. IMPRESSION: 1. Likely mild pulmonary edema. Differential diagnosis includes small airway disease. 2.  Aortic Atherosclerosis (ICD10-I70.0). Electronically Signed   By: Iven Finn M.D.   On: 22/44/9753 00:51       Delora Fuel, MD 12/27/09 [phone]

## 2021-11-24 NOTE — Discharge Instructions (Addendum)
I have ordered an ultrasound in the morning.  Please see the phone number attached to make the phone call in the morning to arrange the timing of the ultrasound for you.  You will need to return to the emergency department to have this done.  You do not need to check in as a patient, and let them know that you are here for your "outpatient ultrasound".  When the study is completed a provider will come out and give you the results.  Please continue to take your furosemide and follow-up with your family doctor.  I have sent a referral for the pulmonologist, hopefully the office will contact you.  If not please see the phone number attached and call the office tomorrow morning

## 2021-11-24 NOTE — ED Notes (Signed)
Patient transported to CT 

## 2021-11-24 NOTE — ED Triage Notes (Signed)
Pov from home. Ambulatory to triage. Cc of sob for a few weeks. States that she went to pcp today and was told to come here. 94% after ambulating.

## 2021-11-24 NOTE — Telephone Encounter (Signed)
Spoke to patient and informed her that I would mail the forms to her to sign and date for her patient assistance. Pt verbalized understanding.

## 2021-11-24 NOTE — ED Provider Notes (Signed)
[facility] EMERGENCY DEPARTMENT Provider Note   CSN: [id number] Arrival date & time: 11/24/21  2020     History  Chief Complaint  Patient presents with   Shortness of Breath    Breanna [name] is a 76 y.o. female.   Shortness of Breath  76 year old female, she has a history of hypertension, she had previously been treated with amlodipine but that has since been stopped.  She started having swelling in her legs a couple of months ago and has had fairly persistent swelling left greater than right.  She is now currently on lisinopril, hydralazine and a beta-blocker.  She reports that over the last month she has had some persistent shortness of breath which is gradually worsening.  It is not associated with any chest pain.  She was referred to cardiology and pulmonology who she has seen and wanted her to get testing but states that the insurance company has not approved any of these test to be done so none of it has been done.  She was referred here today from the urgent care because of the need for further work-up.  The patient denies any orthopnea fevers chills nausea vomiting or diarrhea.  He denies tobacco use or alcohol use and has no history of reactive airway disease at all.  The patient does use an albuterol inhaler occasionally but has not been using it recently.  She was told she needed oxygen a month ago by the respiratory physician but was not told why she needed it and only uses it occasionally.  Her oxygen ranged from 84% when she is really short of breath at 94% when she is not short of breath.  She has gained about 8 pounds recently  Interestingly the patient does have a history of breast cancer status postmastectomy and radiation.    Home Medications Prior to Admission medications   Medication Sig Start Date End Date Taking? Authorizing Provider  acetaminophen (TYLENOL) 500 MG tablet Take 1,000 mg by mouth 3 (three) times daily as needed for headache (pain).    Yes [provider]  Calcium Carbonate-Vit D-Min (CALCIUM 1200 PO) Take by mouth.   Yes [provider]  carvedilol (COREG) 25 MG tablet Take 1 tablet (25 mg total) by mouth 2 (two) times daily with a meal. 03/17/18  Yes Nicholas Lose, MD  cetirizine (ZYRTEC) 10 MG tablet Take 10 mg by mouth daily.   Yes [provider]  clopidogrel (PLAVIX) 75 MG tablet Take 1 tablet (75 mg total) by mouth every evening. Patient taking differently: Take 75 mg by mouth daily. 03/17/18  Yes Nicholas Lose, MD  Emollient (COLLAGEN EX) Apply topically.   Yes [provider]  exemestane (AROMASIN) 25 MG tablet Take 25 mg by mouth at bedtime.   Yes [provider]  furosemide (LASIX) 40 MG tablet Take 40 mg by mouth daily.   Yes [provider]  ketoconazole (NIZORAL) 2 % cream Apply topically daily. 10/21/21  Yes [provider]  lansoprazole (PREVACID) 15 MG capsule Take 15 mg by mouth in the morning.   Yes [provider]  lisinopril (PRINIVIL,ZESTRIL) 40 MG tablet Take 40 mg by mouth daily.   Yes [provider]  metroNIDAZOLE (METROGEL) 0.75 % gel Apply 1 Application topically at bedtime.   Yes [provider]  ofloxacin (OCUFLOX) 0.3 % ophthalmic solution Place 1 drop into both eyes 4 (four) times daily. 10/28/21  Yes [provider]  [place]  Well   Yes [provider]  PARoxetine (PAXIL) 20 MG tablet Take 1 tablet (20 mg total) by mouth at bedtime. 05/04/19  Yes Nicholas Lose, MD  pyridOXINE (VITAMIN B6) 100 MG tablet Take 100 mg by mouth daily.   Yes [provider]  traMADol (ULTRAM) 50 MG tablet TAKE 1 TABLET BY MOUTH EVERY 6 HOURS AS NEEDED FOR MILD PAIN Patient taking differently: Take 50 mg by mouth every 6 (six) hours as needed for moderate pain. 09/16/21  Yes Nicholas Lose, MD  B Complex-C (SUPER B COMPLEX PO) Take 1 tablet by mouth daily.    [provider]      Allergies     Atorvastatin, Vancomycin, Ancef [cefazolin], Lactose, Nasonex [mometasone furoate], and Neurontin [gabapentin]    Review of Systems   Review of Systems  Respiratory:  Positive for shortness of breath.     Physical Exam Updated Vital Signs BP (!) 190/93   Pulse 63   Temp 97.9 F (36.6 C) (Oral)   Resp 10   Ht 1.626 m ('5\' 4"'$ )   Wt 95 kg   SpO2 94%   BMI 35.95 kg/m  Physical Exam Vitals and nursing note reviewed.  Constitutional:      General: She is not in acute distress.    Appearance: She is well-developed.  HENT:     Head: Normocephalic and atraumatic.     Nose: Nose normal.     Mouth/Throat:     Mouth: Mucous membranes are moist.     Pharynx: No oropharyngeal exudate.  Eyes:     General: No scleral icterus.       Right eye: No discharge.        Left eye: No discharge.     Conjunctiva/sclera: Conjunctivae normal.     Pupils: Pupils are equal, round, and reactive to light.  Neck:     Thyroid: No thyromegaly.     Vascular: No JVD.     Comments: No JVD  Cardiovascular:     Rate and Rhythm: Normal rate and regular rhythm.     Heart sounds: Normal heart sounds. No murmur heard.    No friction rub. No gallop.  Pulmonary:     Effort: Pulmonary effort is normal. No respiratory distress.     Breath sounds: Normal breath sounds. No wheezing or rales.     Comments: Diminished Abdominal:     General: Bowel sounds are normal. There is no distension.     Palpations: Abdomen is soft. There is no mass.     Tenderness: There is no abdominal tenderness.  Musculoskeletal:        General: No tenderness. Normal range of motion.     Cervical back: Normal range of motion and neck supple.     Right lower leg: Edema present.     Left lower leg: Edema present.  Lymphadenopathy:     Cervical: No cervical adenopathy.  Skin:    General: Skin is warm and dry.     Findings: No erythema or rash.  Neurological:     Mental Status: She is alert.     Coordination: Coordination normal.   Psychiatric:        Behavior: Behavior normal.     ED Results / Procedures / Treatments   Labs (all labs ordered are listed, but only abnormal results are displayed) Labs Reviewed  CBC WITH DIFFERENTIAL/PLATELET - Abnormal; Notable for the following components:      Result Value   RBC 3.80 (*)  All other components within normal limits  COMPREHENSIVE METABOLIC PANEL - Abnormal; Notable for the following components:   Glucose, Bld 101 (*)    All other components within normal limits  D-DIMER, QUANTITATIVE - Abnormal; Notable for the following components:   D-Dimer, Quant 1.97 (*)    All other components within normal limits  BRAIN NATRIURETIC PEPTIDE  TROPONIN I (HIGH SENSITIVITY)    EKG EKG Interpretation  Date/Time:  Monday November 24 2021 20:48:54 EDT Ventricular Rate:  74 PR Interval:  168 QRS Duration: 86 QT Interval:  372 QTC Calculation: 412 R Axis:   -7 Text Interpretation: Normal sinus rhythm Septal infarct (cited on or before 30-Aug-2021) Abnormal ECG When compared with ECG of 30-Aug-2021 15:24, since last tracing no significant change Confirmed by Noemi Chapel [phone]) on 11/24/2021 9:12:03 PM  Radiology DG Chest 2 View  Result Date: 11/24/2021 CLINICAL DATA:  Pt to ED with c/o SOB. Hx of breast ca in left breast post mastectomy, HTN. EXAM: CHEST - 2 VIEW COMPARISON:  Chest x-ray 04/09/2018, CT chest 08/05/2021 FINDINGS: The heart and mediastinal contours are unchanged. Atherosclerotic plaque of the aorta. No focal consolidation. Chronic coarsened markings with possibly slightly increased interstitial markings in the upper lobes. No pleural effusion. No pneumothorax. No acute osseous abnormality. IMPRESSION: 1. Likely mild pulmonary edema. Differential diagnosis includes small airway disease. 2.  Aortic Atherosclerosis (ICD10-I70.0). Electronically Signed   By: Iven Finn M.D.   On: 11/24/2021 21:13    Procedures Procedures    Medications Ordered in  ED Medications  albuterol (PROVENTIL,VENTOLIN) solution continuous neb ( Nebulization Canceled Entry 11/24/21 2141)  iohexol (OMNIPAQUE) 350 MG/ML injection 100 mL (has no administration in time range)  albuterol (PROVENTIL) (2.5 MG/3ML) 0.083% nebulizer solution (10 mg  Given 11/24/21 2141)    ED Course/ Medical Decision Making/ A&P                           Medical Decision Making Amount and/or Complexity of Data Reviewed Labs: ordered. Radiology: ordered.   This patient presents to the ED for concern of shortness of breath and swelling of the legs, this involves an extensive number of treatment options, and is a complaint that carries with it a high risk of complications and morbidity.  The differential diagnosis includes DVT, pulmonary embolism, congestive heart failure, pulmonary fibrosis, chronic obstructive pulmonary disease, pulmonary hypertension   Co morbidities that complicate the patient evaluation  Hypertension   Additional history obtained:  Additional history obtained from medical record External records from outside source obtained and reviewed including records from urgent care not available, patient brings paperwork with her from urgent care and prior evaluations from pulmonology and cardiology which I reviewed   Lab Tests:  I Ordered, and personally interpreted labs.  The pertinent results include: CBC without leukocytosis or anemia, metabolic panel with normal electrolytes and renal function.  Troponin and BNP are both normal showing no ischemia or significant fluid overload.  The D-dimer was 1.97 which unfortunately was elevated and will require a CT angio   Imaging Studies ordered:  I ordered imaging studies including chest x-ray I independently visualized and interpreted imaging which showed possible pulmonary edema or increased interstitial prominence I agree with the radiologist interpretation   Cardiac Monitoring: / EKG:  The patient was maintained  on a cardiac monitor.  I personally viewed and interpreted the cardiac monitored which showed an underlying rhythm of: Normal sinus rhythm, no arrhythmia or ischemia.  Consultations Obtained:  At change of shift - care signed out to Dr. Roxanne Mins to follow up results and disposition accordingly - pending CT A.  Needs [place] ordered for the AM - doppler of the legs.   Problem List / ED Course / Critical interventions / Medication management  The etiology of the patient's shortness of breath is not exactly clear at the time of change of shift.  CT scan is pending.  The patient has been given an albuterol treatment, she is not hypoxic, she is not tachycardic, her cardiac work-up has been unremarkable and though she is hypertensive she does not have any signs of endorgan damage.   She is already taking furosemide for the edema, that being said she had been on amlodipine in the past which may very well be the cause of the edema which may gradually improve.  She would certainly benefit from compression stockings and ongoing furosemide treatment.  Her renal function is preserved and she can tolerate this ongoing. She will need to follow-up with local pulmonology, follow-up was placed in the chart to help facilitate ambulatory referral to pulmonology locally as the patient is requested. I ordered medication including albuterol for shortness of breath   Social Determinants of Health:  None   If CT angiogram positive for pulmonary embolism of any significant degree patient will need to be admitted to the hospital        Final Clinical Impression(s) / ED Diagnoses Final diagnoses:  SOB (shortness of breath)  Peripheral edema    Rx / DC Orders ED Discharge Orders          Ordered    US Venous Img Lower Bilateral        11/24/21 2300    Ambulatory referral to Pulmonology        11/24/21 2302              Noemi Chapel, MD 11/24/21 2303

## 2021-11-24 NOTE — Telephone Encounter (Signed)
Mitzi from [place] said patient had called over there by mistake. She was returning a call from [place]. [phone]

## 2021-11-25 ENCOUNTER — Ambulatory Visit (HOSPITAL_COMMUNITY)
Admission: RE | Admit: 2021-11-25 | Discharge: 2021-11-25 | Disposition: A | Discharge status: Home / Self Care | Payer: Medicare Other | Source: Ambulatory Visit | Attending: Emergency Medicine | Admitting: Emergency Medicine

## 2021-11-25 DIAGNOSIS — R0602 Shortness of breath: Secondary | ICD-10-CM | POA: Diagnosis not present

## 2021-11-25 DIAGNOSIS — R600 Localized edema: Secondary | ICD-10-CM | POA: Insufficient documentation

## 2021-11-25 NOTE — ED Provider Notes (Signed)
Patient returned here today for scheduled venous imaging of the bilateral lower extremities.  Patient seen here last evening for shortness of breath.  Had positive D-dimer and CT angiogram did not show evidence of PE  Venous imaging of the bilateral lower extremities without evidence of DVT.  Discussed findings with patient.  She is agreeable to close outpatient follow-up with PCP.     US Venous Img Lower Bilateral (DVT)  Result Date: 11/25/2021 CLINICAL DATA:  Bilateral lower extremity edema EXAM: BILATERAL LOWER EXTREMITY VENOUS DOPPLER ULTRASOUND TECHNIQUE: Gray-scale sonography with compression, as well as color and duplex ultrasound, were performed to evaluate the deep venous system(s) from the level of the common femoral vein through the popliteal and proximal calf veins. COMPARISON:  None Available. FINDINGS: VENOUS Normal compressibility of the common femoral, superficial femoral, and popliteal veins, as well as the visualized calf veins. Visualized portions of profunda femoral vein and great saphenous vein unremarkable. No filling defects to suggest DVT on grayscale or color Doppler imaging. Doppler waveforms show normal direction of venous flow, normal respiratory plasticity and response to augmentation. Limited views of the contralateral common femoral vein are unremarkable. OTHER None. Limitations: none IMPRESSION: Negative. Electronically Signed   By: Kathreen Devoid M.D.   On: 11/25/2021 15:01      Kem Parkinson, PA-C 11/25/21 1510    Noemi Chapel, MD 11/25/21 2235

## 2021-12-04 ENCOUNTER — Other Ambulatory Visit: Payer: Self-pay | Admitting: Gastroenterology

## 2021-12-04 DIAGNOSIS — K580 Irritable bowel syndrome with diarrhea: Secondary | ICD-10-CM

## 2021-12-04 MED ORDER — VIBERZI 75 MG PO TABS: 1.0000 | ORAL_TABLET | Freq: Every day | ORAL | 3 refills | Status: DC

## 2021-12-04 MED ORDER — VIBERZI 75 MG PO TABS: 1.0000 | ORAL_TABLET | Freq: Two times a day (BID) | ORAL | 1 refills | Status: DC

## 2021-12-11 ENCOUNTER — Encounter: Payer: Self-pay | Admitting: Internal Medicine

## 2021-12-26 ENCOUNTER — Ambulatory Visit (INDEPENDENT_AMBULATORY_CARE_PROVIDER_SITE_OTHER): Payer: Medicare Other | Admitting: Internal Medicine

## 2021-12-26 ENCOUNTER — Encounter: Payer: Self-pay | Admitting: Internal Medicine

## 2021-12-26 VITALS — BP 120/80 | HR 68 | Ht 65.0 in | Wt 215.0 lb

## 2021-12-26 DIAGNOSIS — G4733 Obstructive sleep apnea (adult) (pediatric): Secondary | ICD-10-CM | POA: Diagnosis not present
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
## 2021-12-26 NOTE — Patient Instructions (Addendum)
Please schedule follow up scheduled with APP in after sleep study.  If my schedule is not open yet, we will contact you with a reminder closer to that time. Please call [phone] if you haven't heard from [place] a month before.   Before your next visit I would like you to have: Home sleep study - we will call you to arrange this.  You will follow up with the nurse practitioner after this test to go over results and prescribe CPAP therapy if needed. This can be a video visit for your convenience, since you live so far away.   Once you start wearing CPAP we need to see you within 31-90 days of wearing the machine to review the results. You can follow up with me in person or by video at that time.

## 2021-12-30 ENCOUNTER — Institutional Professional Consult (permissible substitution): Payer: Medicare Other | Admitting: Internal Medicine

## 2022-01-07 ENCOUNTER — Ambulatory Visit: Payer: Medicare Other | Admitting: Internal Medicine

## 2022-01-16 ENCOUNTER — Encounter (HOSPITAL_COMMUNITY): Payer: Self-pay | Admitting: Nephrology

## 2022-01-16 ENCOUNTER — Other Ambulatory Visit (HOSPITAL_COMMUNITY): Payer: Self-pay | Admitting: Nephrology

## 2022-01-16 DIAGNOSIS — I773 Arterial fibromuscular dysplasia: Secondary | ICD-10-CM

## 2022-02-02 ENCOUNTER — Other Ambulatory Visit: Payer: Self-pay | Admitting: Gastroenterology

## 2022-02-02 ENCOUNTER — Telehealth: Payer: Self-pay

## 2022-02-02 ENCOUNTER — Encounter (HOSPITAL_COMMUNITY): Payer: Self-pay

## 2022-02-02 ENCOUNTER — Ambulatory Visit (HOSPITAL_COMMUNITY)
Admission: RE | Admit: 2022-02-02 | Discharge: 2022-02-02 | Disposition: A | Discharge status: Home / Self Care | Payer: Medicare Other | Source: Ambulatory Visit | Attending: Nephrology | Admitting: Nephrology

## 2022-02-02 DIAGNOSIS — I773 Arterial fibromuscular dysplasia: Secondary | ICD-10-CM | POA: Diagnosis present

## 2022-02-02 DIAGNOSIS — K580 Irritable bowel syndrome with diarrhea: Secondary | ICD-10-CM

## 2022-02-02 MED ORDER — IOHEXOL 350 MG/ML SOLN
75.0000 mL | Freq: Once | INTRAVENOUS | Status: AC | PRN
  Administered 2022-02-02: 75 mL via INTRAVENOUS

## 2022-02-02 MED ORDER — VIBERZI 75 MG PO TABS: 1.0000 | ORAL_TABLET | Freq: Two times a day (BID) | ORAL | 1 refills | Status: DC

## 2022-02-02 MED ORDER — SODIUM CHLORIDE (PF) 0.9 % IJ SOLN
INTRAMUSCULAR | Status: AC
  Filled 2022-02-02: qty 50

## 2022-02-02 NOTE — Telephone Encounter (Signed)
Breanna [name] assist patient assistance is needing a new rx for viberzi printed to finish processing patient's application.

## 2022-02-03 NOTE — Telephone Encounter (Signed)
Signed prescription along with forms has been faxed to [facility] assist and requested.

## 2022-02-12 ENCOUNTER — Ambulatory Visit: Payer: No Typology Code available for payment source | Admitting: Podiatry

## 2022-02-19 ENCOUNTER — Ambulatory Visit: Payer: Medicare Other | Admitting: Podiatry

## 2022-02-19 ENCOUNTER — Encounter: Payer: Self-pay | Admitting: Podiatry

## 2022-02-19 ENCOUNTER — Ambulatory Visit (INDEPENDENT_AMBULATORY_CARE_PROVIDER_SITE_OTHER): Payer: Medicare Other | Admitting: Podiatry

## 2022-02-19 VITALS — BP 141/61

## 2022-02-19 DIAGNOSIS — L600 Ingrowing nail: Secondary | ICD-10-CM

## 2022-02-19 NOTE — Patient Instructions (Signed)

## 2022-02-20 NOTE — Progress Notes (Signed)
Subjective:   Patient ID: Breanna [name], female   DOB: 76 y.o.   MRN: [id number]   HPI Patient states that her nails have become discolored on the both the big ones and the left big one has become painful on the corner and making it hard to wear shoe gear   ROS      Objective:  Physical Exam  Neurovascular status intact with patient found to have moderate discoloration of the hallux nails of both feet localized with an incurvated left hallux lateral border painful when pressed with no redness or drainage noted     Assessment:  Ingrown toenail deformity left hallux lateral border with pain     Plan:  H&P reviewed condition and I have recommended correction of deformity.  I did explain that the damage to the nail plate and the way it is functioning and I do think correction would be best and I explained procedure risk she wants to have this done due to pain and I allowed her to read then signed consent form understanding risk.  Today I infiltrated the left hallux 60 mg like Marcaine mixture sterile prep done using sterile instrumentation remove the lateral border exposed matrix applied phenol 3 applications 30 seconds followed by alcohol lavage sterile dressing gave instructions on soaks and to leave dressing on 24 hours but take it off earlier if throbbing were to occur.  Encouraged her to call with questions concerns which may arise

## 2022-02-23 ENCOUNTER — Ambulatory Visit: Payer: Medicare Other | Admitting: Podiatry

## 2022-03-23 ENCOUNTER — Encounter (HOSPITAL_COMMUNITY): Payer: Self-pay | Admitting: Emergency Medicine

## 2022-03-23 ENCOUNTER — Emergency Department (HOSPITAL_COMMUNITY)
Admission: EM | Admit: 2022-03-23 | Discharge: 2022-03-23 | Disposition: A | Discharge status: Home / Self Care | Payer: Medicare Other | Attending: Emergency Medicine | Admitting: Emergency Medicine

## 2022-03-23 ENCOUNTER — Emergency Department (HOSPITAL_COMMUNITY): Payer: Medicare Other

## 2022-03-23 ENCOUNTER — Other Ambulatory Visit: Payer: Self-pay

## 2022-03-23 DIAGNOSIS — R0602 Shortness of breath: Secondary | ICD-10-CM | POA: Insufficient documentation

## 2022-03-23 DIAGNOSIS — B338 Other specified viral diseases: Secondary | ICD-10-CM

## 2022-03-23 DIAGNOSIS — R079 Chest pain, unspecified: Secondary | ICD-10-CM | POA: Diagnosis present

## 2022-03-23 DIAGNOSIS — Z853 Personal history of malignant neoplasm of breast: Secondary | ICD-10-CM | POA: Insufficient documentation

## 2022-03-23 DIAGNOSIS — I10 Essential (primary) hypertension: Secondary | ICD-10-CM | POA: Insufficient documentation

## 2022-03-23 DIAGNOSIS — Z79899 Other long term (current) drug therapy: Secondary | ICD-10-CM | POA: Insufficient documentation

## 2022-03-23 DIAGNOSIS — B974 Respiratory syncytial virus as the cause of diseases classified elsewhere: Secondary | ICD-10-CM | POA: Insufficient documentation

## 2022-03-23 DIAGNOSIS — Z7902 Long term (current) use of antithrombotics/antiplatelets: Secondary | ICD-10-CM | POA: Insufficient documentation

## 2022-03-23 DIAGNOSIS — Z1152 Encounter for screening for COVID-19: Secondary | ICD-10-CM | POA: Diagnosis not present

## 2022-03-23 DIAGNOSIS — J449 Chronic obstructive pulmonary disease, unspecified: Secondary | ICD-10-CM | POA: Diagnosis not present

## 2022-03-23 LAB — CBC
HCT: 41.3 % (ref 36.0–46.0)
Hemoglobin: 13.4 g/dL (ref 12.0–15.0)
MCH: 32.1 pg (ref 26.0–34.0)
MCHC: 32.4 g/dL (ref 30.0–36.0)
MCV: 98.8 fL (ref 80.0–100.0)
Platelets: 204 10*3/uL (ref 150–400)
RBC: 4.18 MIL/uL (ref 3.87–5.11)
RDW: 13.7 % (ref 11.5–15.5)
WBC: 10.1 10*3/uL (ref 4.0–10.5)
nRBC: 0 % (ref 0.0–0.2)

## 2022-03-23 LAB — BASIC METABOLIC PANEL
Anion gap: 10 (ref 5–15)
BUN: 23 mg/dL (ref 8–23)
CO2: 30 mmol/L (ref 22–32)
Calcium: 8.8 mg/dL — ABNORMAL LOW (ref 8.9–10.3)
Chloride: 99 mmol/L (ref 98–111)
Creatinine, Ser: 0.77 mg/dL (ref 0.44–1.00)
GFR, Estimated: >60 mL/min (ref >60)
Glucose, Bld: 103 mg/dL — ABNORMAL HIGH (ref 70–99)
Potassium: 3.4 mmol/L — ABNORMAL LOW (ref 3.5–5.1)
Sodium: 139 mmol/L (ref 135–145)

## 2022-03-23 LAB — RESP PANEL BY RT-PCR (RSV, FLU A&B, COVID)  RVPGX2
Influenza A by PCR: NEGATIVE
Influenza B by PCR: NEGATIVE
Resp Syncytial Virus by PCR: POSITIVE — AB
SARS Coronavirus 2 by RT PCR: NEGATIVE

## 2022-03-23 LAB — TROPONIN I (HIGH SENSITIVITY): Troponin I (High Sensitivity): 8 ng/L (ref <18)

## 2022-03-23 MED ORDER — IPRATROPIUM-ALBUTEROL 0.5-2.5 (3) MG/3ML IN SOLN
3.0000 mL | Freq: Once | RESPIRATORY_TRACT | Status: AC
  Administered 2022-03-23: 3 mL via RESPIRATORY_TRACT
  Filled 2022-03-23: qty 3

## 2022-03-23 MED ORDER — GUAIFENESIN-DM 100-10 MG/5ML PO SYRP: 5.0000 mL | ORAL_SOLUTION | ORAL | 0 refills | Status: DC | PRN

## 2022-03-23 NOTE — Discharge Instructions (Addendum)
Evaluation for your shortness of breath cough and congestion revealed that you do have RSV.  Recommend conservative treatment at home.  If you have worsening shortness of breath, chest pain or any other concerning symptom please return to the emergency department for further evaluation.  At Robitussin to your pharmacy to help with cough suppressant.  Recommend that you take it only at night so that he can get a good nights rest.  Otherwise okay to cough during the day.  Advise he follow-up with your PCP if your symptoms persist.

## 2022-03-23 NOTE — ED Triage Notes (Signed)
Pt presents with CP, SOB, cough and runny nose x 2 weeks, seen at UC diagnosed with bronchitis, started on ABts, not getting any better.

## 2022-03-23 NOTE — ED Provider Notes (Signed)
[facility] EMERGENCY DEPARTMENT Provider Note   CSN: [id number] Arrival date & time: 03/23/22  1020     History  Chief Complaint  Patient presents with   Chest Pain   Shortness of Breath   HPI Breanna [name] is a 77 y.o. female with history of COPD, GERD, hypertension, TIA and left-sided breast cancer presenting for chest pain and shortness of breath.  Patient has had 2 weeks of cough and congestion along with her husband who had similar symptoms.  In the last week she has been more short of breath with chest pain.  Chest pain is in the left chest and at times radiates to her back.  It feels like stabbing.  Chest pain comes and goes and is not reproducible.  At this time she is not experiencing chest pain.  States her shortness of breath is worse with exertion and just gotten progressively worse.  States she was seen by urgent care last week and prescribed antibiotic for bronchitis but symptoms have not improved.   Chest Pain Associated symptoms: shortness of breath   Shortness of Breath Associated symptoms: chest pain        Home Medications Prior to Admission medications   Medication Sig Start Date End Date Taking? Authorizing Provider  guaiFENesin-dextromethorphan (ROBITUSSIN DM) 100-10 MG/5ML syrup Take 5 mLs by mouth every 4 (four) hours as needed for cough. 03/23/22  Yes Harriet Pho, PA-C  acetaminophen (TYLENOL) 500 MG tablet Take 1,000 mg by mouth 3 (three) times daily as needed for headache (pain).     [provider]  B Complex-C (SUPER B COMPLEX PO) Take 1 tablet by mouth daily.    [provider]  Calcium Carbonate-Vit D-Min (CALCIUM 1200 PO) Take by mouth.    [provider]  carvedilol (COREG) 25 MG tablet Take 1 tablet (25 mg total) by mouth 2 (two) times daily with a meal. 03/17/18   Nicholas Lose, MD  cetirizine (ZYRTEC) 10 MG tablet Take 10 mg by mouth daily.    [provider]  clopidogrel (PLAVIX) 75 MG tablet Take 1  tablet (75 mg total) by mouth every evening. Patient taking differently: Take 75 mg by mouth daily. 03/17/18   Nicholas Lose, MD  Eluxadoline (VIBERZI) 75 MG TABS Take 1 tablet by mouth in the morning and at bedtime. With food 02/02/22   Sherron Monday, NP  Emollient (COLLAGEN EX) Apply topically.    [provider]  exemestane (AROMASIN) 25 MG tablet Take 25 mg by mouth at bedtime.    [provider]  furosemide (LASIX) 40 MG tablet Take 40 mg by mouth daily.    [provider]  ketoconazole (NIZORAL) 2 % cream Apply topically daily. 10/21/21   [provider]  lansoprazole (PREVACID) 15 MG capsule Take 15 mg by mouth in the morning.    [provider]  lisinopril (PRINIVIL,ZESTRIL) 40 MG tablet Take 40 mg by mouth daily.    [provider]  metroNIDAZOLE (METROGEL) 0.75 % gel Apply 1 Application topically at bedtime.    [provider]  ofloxacin (OCUFLOX) 0.3 % ophthalmic solution Place 1 drop into both eyes 4 (four) times daily. 10/28/21   [provider]  Martin Well    [provider]  PARoxetine (PAXIL) 20 MG tablet Take 1 tablet (20 mg total) by mouth at bedtime. 05/04/19   Nicholas Lose, MD  pyridOXINE (VITAMIN B6) 100 MG tablet Take 100 mg by mouth  daily.    [provider]  traMADol (ULTRAM) 50 MG tablet TAKE 1 TABLET BY MOUTH EVERY 6 HOURS AS NEEDED FOR MILD PAIN Patient taking differently: Take 50 mg by mouth every 6 (six) hours as needed for moderate pain. 09/16/21   Nicholas Lose, MD      Allergies    Atorvastatin, Vancomycin, Ancef [cefazolin], Lactose, Nasonex [mometasone furoate], and Neurontin [gabapentin]    Review of Systems   Review of Systems  Respiratory:  Positive for shortness of breath.   Cardiovascular:  Positive for chest pain.     Physical Exam   Vitals:   03/23/22 1109  BP: 124/72  Pulse: 77  Resp: 20  Temp: 98.7 F (37.1 C)  SpO2: 92%     CONSTITUTIONAL:  well-appearing, NAD NEURO:  Alert and oriented x 3, CN 3-12 grossly intac EYES:  eyes equal and reactive ENT/NECK:  Supple, no stridor  CARDIO:  regular rhythm, appears well-perfused  PULM:  No respiratory distress, diffuse expiratory wheezing bilaterally MSK/SPINE:  No gross deformities, no edema, moves all extremities  SKIN:  no rash, atraumatic   *Additional and/or pertinent findings included in MDM below   ED Results / Procedures / Treatments   Labs (all labs ordered are listed, but only abnormal results are displayed) Labs Reviewed  RESP PANEL BY RT-PCR (RSV, FLU A&B, COVID)  RVPGX2 - Abnormal; Notable for the following components:      Result Value   Resp Syncytial Virus by PCR POSITIVE (*)    All other components within normal limits  BASIC METABOLIC PANEL - Abnormal; Notable for the following components:   Potassium 3.4 (*)    Glucose, Bld 103 (*)    Calcium 8.8 (*)    All other components within normal limits  CBC  TROPONIN I (HIGH SENSITIVITY)    EKG EKG Interpretation  Date/Time:  Monday March 23 2022 11:14:36 EST Ventricular Rate:  70 PR Interval:  152 QRS Duration: 94 QT Interval:  400 QTC Calculation: 432 R Axis:   -27 Text Interpretation: Normal sinus rhythm Moderate voltage criteria for LVH, may be normal variant ( R in aVL , Cornell product ) Anteroseptal infarct (cited on or before 30-Aug-2021) Abnormal ECG When compared with ECG of 24-Nov-2021 20:48, Questionable change in initial forces of Anteroseptal leads Confirmed by Aletta Edouard [phone]) on 03/23/2022 11:18:21 AM  Radiology DG Chest Port 1 View  Result Date: 03/23/2022 CLINICAL DATA:  Chest pain. Left-sided chest pain with cough and congestion. History of bronchitis. EXAM: PORTABLE CHEST 1 VIEW COMPARISON:  Chest radiographs 11/24/2021 and older. Chest CT 11/24/2021. FINDINGS: Lungs are well-aerated without focal opacity. Normal heart size. Stable mediastinal contours with  atherosclerotic calcifications of the aortic arch mild aortic tortuosity. No pleural effusion or pneumothorax. Partially visualized hardware from prior cervical spinal fusion. Bones and upper abdomen are otherwise unremarkable. IMPRESSION: No acute cardiopulmonary findings. Electronically Signed   By: Emmit Alexanders M.D   On: 03/23/2022 11:37    Procedures Procedures    Medications Ordered in ED Medications  ipratropium-albuterol (DUONEB) 0.5-2.5 (3) MG/3ML nebulizer solution 3 mL (3 mLs Nebulization Given 03/23/22 1145)    ED Course/ Medical Decision Making/ A&P                             Medical Decision Making Amount and/or Complexity of Data Reviewed Labs: ordered. Radiology: ordered.  Risk Prescription drug management.   Initial Impression and Ddx 77 year old  female who is well-appearing, nontoxic and hemodynamically stable presenting for cough congestion and shortness of breath.  Exam was overall reassuring but did reveal diffuse wheezing.  Differential diagnosed with this complaint includes pneumonia, URI, ACS and CHF. Patient PMH that increases complexity of ED encounter: COPD, history of breast cancer and hypertension  Interpretation of Diagnostics I independent reviewed and interpreted the labs as followed: Hypokalemia, positive for RSV  - I independently visualized the following imaging with scope of interpretation limited to determining acute life threatening conditions related to emergency care: chest X-ray, which revealed no acute cardiopulmonary process  I personally reviewed and interpreted EKG which revealed normal sinus rhythm  Patient Reassessment and Ultimate Disposition/Management Given recent history of cough congestion and findings on physical exam proper concern for URI versus pneumonia.  Treated shortness of breath with DuoNeb.  Patient states she felt much better.  Respiratory panel did reveal that she is positive for RSV.  Symptoms are consistent with  this diagnosis.  Considered pneumonia but unlikely given reassuring x-ray.  Also considered ACS but unlikely given negative troponin and normal EKG.  Advised patient to continue conservative treatment at home for RSV.  Discussed return precautions.  Advised her to follow-up with her PCP.  Sent guanfacine-dextromethorphan to her pharmacy for cough treatment.  Patient management required discussion with the following services or consulting groups:  None  Complexity of Problems Addressed Acute complicated illness or Injury  Additional Data Reviewed and Analyzed Further history obtained from: None  Patient Encounter Risk Assessment Prescriptions         Final Clinical Impression(s) / ED Diagnoses Final diagnoses:  RSV (respiratory syncytial virus infection)    Rx / DC Orders ED Discharge Orders          Ordered    guaiFENesin-dextromethorphan (ROBITUSSIN DM) 100-10 MG/5ML syrup  Every 4 hours PRN        03/23/22 1410              Harriet Pho, PA-C 03/23/22 1411    Hayden Rasmussen, MD 03/23/22 1749

## 2022-03-23 NOTE — ED Notes (Signed)
Pt ambulated to the restroom w/o assistance. Upon returning to her room, she reported dizziness while ambulating. O2 saturation noted to be 90%-91% on room air just after getting back in the bed.

## 2022-04-21 ENCOUNTER — Ambulatory Visit: Payer: Medicare Other

## 2022-04-21 DIAGNOSIS — G4733 Obstructive sleep apnea (adult) (pediatric): Secondary | ICD-10-CM | POA: Diagnosis not present

## 2022-05-08 ENCOUNTER — Telehealth: Payer: Self-pay | Admitting: *Deleted

## 2022-05-08 DIAGNOSIS — G4733 Obstructive sleep apnea (adult) (pediatric): Secondary | ICD-10-CM

## 2022-05-08 NOTE — Telephone Encounter (Signed)
Will route to Dr. Ander Slade and [facility] triage.  Please advise, thank you!

## 2022-05-08 NOTE — Telephone Encounter (Signed)
patient would like to check on status of sleep study results/ please call and advise patient [phone]

## 2022-05-18 NOTE — Telephone Encounter (Signed)
Patient was transferred to my phone wanting an update on results of HST. Informed patient I would get with PCC's and the provider to see what the delay is. Please advise.

## 2022-05-18 NOTE — Telephone Encounter (Signed)
Can we get another sleep doctor to read results as patient has already waited a month for results?

## 2022-05-18 NOTE — Telephone Encounter (Signed)
I am not seeing any documentation of when she had a sleep study done recently

## 2022-05-18 NOTE — Telephone Encounter (Signed)
Home sleep test on 04/21/22 shows severe obstructive sleep apnea averaging 47 apneas/ hour with low blood oxygen level.   Based on review of Dr Mauricio Po note, I recommend we order new DME, new CPAP auto 5-20, mask of choice, humidifier, supplies, AirView/ card  She will need f/u appointment with one of the APPS in 31-90 days for CPAP follow-up

## 2022-05-18 NOTE — Telephone Encounter (Signed)
Message sent to [facility] to check for HST. Will update encounter once I hear back from her.

## 2022-05-18 NOTE — Addendum Note (Signed)
Addended by: Loma Sousa on: 05/18/2022 04:58 PM   Modules accepted: Orders

## 2022-05-18 NOTE — Telephone Encounter (Signed)
Spoke with Breanna [name]. She was able to find the results. She has printed the results for Dr. Ander Slade to read once he returns to clinic on 3/14.

## 2022-05-18 NOTE — Telephone Encounter (Signed)
Called pt with hst results. Pt verbalized understanding. Cpap ordered, nothing further needed.

## 2022-05-18 NOTE — Telephone Encounter (Signed)
I have received the sleep study and handed it to Dr.Young to read.

## 2022-05-18 NOTE — Telephone Encounter (Signed)
Called patient to let her know sleep study has been given to sleep doctor to result and she should be hearing back from [place] soon.

## 2022-05-20 DIAGNOSIS — G4733 Obstructive sleep apnea (adult) (pediatric): Secondary | ICD-10-CM | POA: Diagnosis not present

## 2022-06-01 ENCOUNTER — Telehealth: Payer: Self-pay | Admitting: *Deleted

## 2022-06-01 NOTE — Telephone Encounter (Signed)
Patient stopped by the desk and states that she still has not received any bipap machine from [facility]. Patient seems increasingly frustrated and would like to check on status. Please call and advise patient with an update. [phone]

## 2022-06-02 NOTE — Telephone Encounter (Signed)
Spoke with [employer]- they attempted to reach out to patient yesterday. Spoke with patient to inform, gave her [employer]'s number to contact.

## 2022-06-02 NOTE — Telephone Encounter (Signed)
Message sent to [employer].  

## 2022-06-02 NOTE — Telephone Encounter (Signed)
[place] patient.  Routing to [place] triage and [facility] pool to follow up.

## 2022-07-17 ENCOUNTER — Telehealth: Payer: Self-pay | Admitting: Internal Medicine

## 2022-07-17 NOTE — Telephone Encounter (Signed)
Dr's office is checking on status of surgical clearance

## 2022-07-17 NOTE — Telephone Encounter (Signed)
ATC X1 LVM for patient to call the office back. Pt needs OV for surgical clearance. Please schedule with Dr. Celine Mans or an APP

## 2022-07-21 NOTE — Telephone Encounter (Signed)
ATC X1 LVM for patient to call the office back. Patient needs OV for surgical clearance. Please schedule with Dr. Celine Mans or APP

## 2022-07-24 ENCOUNTER — Telehealth: Payer: Self-pay | Admitting: *Deleted

## 2022-07-24 NOTE — Telephone Encounter (Signed)
Called [place] ([place], [place]), spoke with Vickie regarding being tagged in the patient's airview account.  She stated she sent a note back to the nurses so we could be tagged to allow [place] to view her airview acount.

## 2022-07-27 ENCOUNTER — Ambulatory Visit (INDEPENDENT_AMBULATORY_CARE_PROVIDER_SITE_OTHER): Payer: Medicare Other | Admitting: Adult Health

## 2022-07-27 ENCOUNTER — Encounter: Payer: Self-pay | Admitting: Adult Health

## 2022-07-27 VITALS — BP 132/60 | HR 72 | Temp 98.2°F | Ht 64.5 in | Wt 224.8 lb

## 2022-07-27 DIAGNOSIS — J449 Chronic obstructive pulmonary disease, unspecified: Secondary | ICD-10-CM

## 2022-07-27 DIAGNOSIS — R0600 Dyspnea, unspecified: Secondary | ICD-10-CM | POA: Diagnosis not present

## 2022-07-27 DIAGNOSIS — G4733 Obstructive sleep apnea (adult) (pediatric): Secondary | ICD-10-CM | POA: Diagnosis not present

## 2022-07-27 DIAGNOSIS — Z01811 Encounter for preprocedural respiratory examination: Secondary | ICD-10-CM | POA: Diagnosis not present

## 2022-07-27 MED ORDER — ALBUTEROL SULFATE HFA 108 (90 BASE) MCG/ACT IN AERS: 1.0000 | INHALATION_SPRAY | Freq: Four times a day (QID) | RESPIRATORY_TRACT | 2 refills | Status: DC | PRN

## 2022-07-27 NOTE — Patient Instructions (Addendum)
CPAP mask fitting , may like the Dream wear full face mask.  Set up CPAP titration study at [facility]  Change CPAP pressure to CPAP auto 10 to 20cmH2O.  Wear CPAP all night long .  Do not drive if sleepy   Try Albuterol inhaler 1-2 puffs every 6hr as needed .   Follow up in 4 weeks with Dr. Celine Mans or Tobi Groesbeck NP with PFT.   Follow up with cardiology as planned.

## 2022-07-27 NOTE — Assessment & Plan Note (Addendum)
Chronic shortness of breath with activity dating back to 2018 suspect is multifactorial with restrictive lung disease from obesity, deconditioning, possible diaphragmatic dysfunction (previous C spine surgery ? Phrenic nerve injury) , possible chest wall changes from mastectomy and radiation . May need to consider SNIFF test going forward.  Check PFT on return. Recent CT chest September 2023 showed mild emphysematous changes.  No PE. Walk test today in the office shows some mild desaturations but only down to 89% on room air.  No indication for oxygen at this time.  Plan  Patient Instructions  CPAP mask fitting , may like the Dream wear full face mask.  Set up CPAP titration study at [facility]  Change CPAP pressure to CPAP auto 10 to 20cmH2O.  Wear CPAP all night long .  Do not drive if sleepy   Try Albuterol inhaler 1-2 puffs every 6hr as needed .   Follow up in 4 weeks with Dr. Celine Mans or Cielo Arias NP with PFT.   Follow up with cardiology as planned.

## 2022-07-27 NOTE — Assessment & Plan Note (Addendum)
Patient is planning on upcoming right knee replacement surgery.  Is here for a preop risk assessment.  From a pulmonary standpoint patient remains independent.  She is not on oxygen.  She does have underlying severe sleep apnea on CPAP .  Does have chronic dyspnea with underlying restrictive lung disease and emphysema.  With comorbidities and age would be at a moderate to high risk but not excluded from a pulmonary standpoint  Would recommend surgery be performed at hospital setting not outpatient surgical center. Use CPAP in postop setting if indicated.   Discussed her potential pulmonary risk    Major Pulmonary risks identified in the multifactorial risk analysis are but not limited to a) pneumonia; b) recurrent intubation risk; c) prolonged or recurrent acute respiratory failure needing mechanical ventilation; d) prolonged hospitalization; e) DVT/Pulmonary embolism; f) Acute Pulmonary edema  Recommend 1. Short duration of surgery as much as possible and avoid paralytic if possible 2. Recovery in step down or ICU with Pulmonary consultation if indicated  3. DVT prophylaxis per protocol .  4. Aggressive pulmonary toilet with o2, bronchodilatation, and incentive spirometry and early ambulation 5. CPAP in post op setting if indicated.

## 2022-07-27 NOTE — Telephone Encounter (Signed)
Patient was seen today for Pulmonary clearance. Risk assessment and OV notes have been sent back to [facility]. NFN
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
## 2022-07-27 NOTE — Assessment & Plan Note (Signed)
Restrictive lung disease noted on previous PFTs.  Has a strong smoking history.  Emphysema noted on previous CT chest.  Will repeat PFTs.  For now trial of albuterol.  Could consider adding in a long-acting bronchodilator.  Patient was tried on Stiolto in the past but unclear if had any perceived benefit.  Plan  Patient Instructions  CPAP mask fitting , may like the Dream wear full face mask.  Set up CPAP titration study at [facility]  Change CPAP pressure to CPAP auto 10 to 20cmH2O.  Wear CPAP all night long .  Do not drive if sleepy   Try Albuterol inhaler 1-2 puffs every 6hr as needed .   Follow up in 4 weeks with Dr. Celine Mans or Glorimar Stroope NP with PFT.   Follow up with cardiology as planned.

## 2022-07-27 NOTE — Assessment & Plan Note (Addendum)
Severe OSA with associated nocturnal hypoxemia-patient has excellent compliance.  Continues to have residual events.  Will adjust CPAP pressure to see if we can decrease number of events also needs a mask fitting which may help as well.  Patient does have significant nocturnal hypoxemia will set up for CPAP titration as may need more advanced therapy such as BiPAP.  Also has a history of previous hypercarbic and hypoxic respiratory failure previously on oxygen therapy in the past.   Plan  Patient Instructions  CPAP mask fitting , may like the Dream wear full face mask.  Set up CPAP titration study at [facility]  Change CPAP pressure to CPAP auto 10 to 20cmH2O.  Wear CPAP all night long .  Do not drive if sleepy   Try Albuterol inhaler 1-2 puffs every 6hr as needed .   Follow up in 4 weeks with Dr. Celine Mans or Pablo Stauffer NP with PFT.   Follow up with cardiology as planned.

## 2022-07-30 ENCOUNTER — Encounter: Payer: Self-pay | Admitting: Gastroenterology

## 2022-07-30 ENCOUNTER — Ambulatory Visit (INDEPENDENT_AMBULATORY_CARE_PROVIDER_SITE_OTHER): Payer: Medicare Other | Admitting: Gastroenterology

## 2022-07-30 VITALS — BP 151/70 | HR 69 | Temp 97.8°F | Ht 65.0 in | Wt 222.8 lb

## 2022-07-30 DIAGNOSIS — R140 Abdominal distension (gaseous): Secondary | ICD-10-CM

## 2022-07-30 DIAGNOSIS — K580 Irritable bowel syndrome with diarrhea: Secondary | ICD-10-CM

## 2022-07-30 DIAGNOSIS — K219 Gastro-esophageal reflux disease without esophagitis: Secondary | ICD-10-CM | POA: Diagnosis not present

## 2022-07-30 MED ORDER — VIBERZI 75 MG PO TABS
1.0000 | ORAL_TABLET | Freq: Two times a day (BID) | ORAL | 0 refills | Status: DC
Start: 2022-07-30 — End: 2022-07-30

## 2022-07-30 MED ORDER — VIBERZI 75 MG PO TABS
1.0000 | ORAL_TABLET | Freq: Two times a day (BID) | ORAL | 0 refills | Status: DC
Start: 2022-07-30 — End: 2022-08-06

## 2022-07-30 NOTE — Patient Instructions (Addendum)
I will refill your Viberzi for you today.  You should continue 75 mg twice daily.  To help with the bloating: Start a daily probiotic (align, [employer] colon health, Visbiome) Avoid gas-producing foods (eg, cabbage, legumes, onions, broccoli, brussel sprouts, wheat, and potatoes).  You may use an anti-gas medication such as: Gas-X, Beano, or Phazyme  We will plan to follow-up in 6 months.  If you have any worsening symptoms or if you do not have any improvement with anti-gas medication or probiotic please let me know if you would like to proceed with SIBO testing or sucrose deficiency testing.  It was a pleasure to see you today. I want to create trusting relationships with patients. If you receive a survey regarding your visit,  I greatly appreciate you taking time to fill this out on paper or through your MyChart. I value your feedback.  Brooke Bonito, MSN, FNP-BC, AGACNP-BC [facility]
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
## 2022-08-04 ENCOUNTER — Ambulatory Visit: Payer: Medicare Other | Admitting: Internal Medicine

## 2022-08-05 NOTE — Telephone Encounter (Signed)
If you can tolerate can continue to try it is cutting down on total sleep apneic events but if can't tolerate, can stop until titration study .

## 2022-08-06 ENCOUNTER — Other Ambulatory Visit: Payer: Self-pay | Admitting: Gastroenterology

## 2022-08-06 ENCOUNTER — Telehealth: Payer: Self-pay

## 2022-08-06 DIAGNOSIS — K580 Irritable bowel syndrome with diarrhea: Secondary | ICD-10-CM

## 2022-08-06 MED ORDER — VIBERZI 75 MG PO TABS
1.0000 | ORAL_TABLET | Freq: Two times a day (BID) | ORAL | 0 refills | Status: AC
Start: 2022-08-06 — End: 2022-11-04

## 2022-08-06 NOTE — Telephone Encounter (Signed)
I need an Rx for Viberzi to be printed and signed for this pt so that it can be faxed to [facility] so that the pt can receive her shipment. Pt was last seen on 07/30/2022.

## 2022-08-12 NOTE — Telephone Encounter (Signed)
New message sent by pt:  [facility] (supporting Tammy S Parrett, NP)39 minutes ago (2:06 PM)    my titration study is the 16th of June.  I see you on the 18th, is that enough time for you to get thee results?  The paperwork says 10 days until the results are in.  Do I need to reschedule with you.?  Thank you.Liborio Nixon, please advise if we should reschedule pt's appt. You do not have any openings until 7/24 unless we use a held slot for HFU or acute visit.

## 2022-08-23 ENCOUNTER — Ambulatory Visit (HOSPITAL_BASED_OUTPATIENT_CLINIC_OR_DEPARTMENT_OTHER): Payer: Medicare Other | Admitting: Pulmonary Disease

## 2022-08-25 ENCOUNTER — Ambulatory Visit: Payer: Medicare Other | Admitting: Adult Health

## 2022-09-09 ENCOUNTER — Encounter: Payer: Medicare Other | Admitting: Pulmonary Disease

## 2022-09-22 ENCOUNTER — Ambulatory Visit (HOSPITAL_BASED_OUTPATIENT_CLINIC_OR_DEPARTMENT_OTHER): Payer: Medicare Other | Attending: Adult Health | Admitting: Pulmonary Disease

## 2022-09-22 DIAGNOSIS — G4733 Obstructive sleep apnea (adult) (pediatric): Secondary | ICD-10-CM | POA: Insufficient documentation

## 2022-09-30 ENCOUNTER — Ambulatory Visit (INDEPENDENT_AMBULATORY_CARE_PROVIDER_SITE_OTHER): Payer: Medicare Other

## 2022-09-30 ENCOUNTER — Ambulatory Visit (INDEPENDENT_AMBULATORY_CARE_PROVIDER_SITE_OTHER): Payer: Medicare Other | Admitting: Adult Health

## 2022-09-30 ENCOUNTER — Encounter: Payer: Self-pay | Admitting: Adult Health

## 2022-09-30 VITALS — BP 136/60 | HR 96 | Temp 98.2°F | Ht 64.5 in | Wt 225.2 lb

## 2022-09-30 DIAGNOSIS — G4733 Obstructive sleep apnea (adult) (pediatric): Secondary | ICD-10-CM | POA: Diagnosis not present

## 2022-09-30 DIAGNOSIS — J449 Chronic obstructive pulmonary disease, unspecified: Secondary | ICD-10-CM | POA: Diagnosis not present
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
## 2022-09-30 NOTE — Patient Instructions (Addendum)
Will call with CPAP titration study results  Wear CPAP all night long .  Do not drive if sleepy  You should have Dream wear full face mask - will call [employer] .   Albuterol inhaler 1-2 puffs every 6hr as needed .  Set up PFT   Follow up in 3 months with Dr. Celine Mans or Michelyn Scullin NP and As needed

## 2022-10-01 NOTE — Assessment & Plan Note (Signed)
Restrictive lung disease noted on previous PFTs.  Has a strong smoking history.  Emphysema noted on previous CT chest.  Will repeat PFTs.  Previously no perceived benefit with Stiolto.  Continue with albuterol for now.  Plan  Patient Instructions  Will call with CPAP titration study results  Wear CPAP all night long .  Do not drive if sleepy  You should have Dream wear full face mask - will call [employer] .   Albuterol inhaler 1-2 puffs every 6hr as needed .  Set up PFT   Follow up in 3 months with Dr. Celine Mans or Dea Bitting NP and As needed

## 2022-10-01 NOTE — Assessment & Plan Note (Signed)
Severe obstructive apnea with associated nocturnal hypoxemia.  CPAP titration study has been done results are pending.  Once I have the results I will give her a call.  She continues to have mask issues.  We have contacted her DME to help with mask fitting.  Continue to use as able to cut

## 2022-10-01 NOTE — Telephone Encounter (Signed)
Discussed at ov  

## 2022-10-02 ENCOUNTER — Other Ambulatory Visit: Payer: Self-pay

## 2022-10-02 DIAGNOSIS — G4733 Obstructive sleep apnea (adult) (pediatric): Secondary | ICD-10-CM

## 2022-10-02 NOTE — Procedures (Signed)
Patient Name: Breanna [name], Frommer Date: 09/22/2022 Gender: Female D.O.B: 1945/05/01 Age (years): 89 Referring Provider: Babette Relic Parrett Height (inches): 65 Interpreting Physician: Cyril Mourning MD, ABSM Weight (lbs): 215 RPSGT: Ulyess Mort BMI: 36 MRN: [id number] Neck Size: 16.00 <br> <br> CLINICAL INFORMATION The patient is referred for a BiPAP titration to treat sleep apnea.     HST: [id number], 2024 that showed severe sleep apnea with AHI of 47.5/hour and SpO2 low at 64% with an average O2 saturation at 89%.   SLEEP STUDY TECHNIQUE As per the AASM Manual for the Scoring of Sleep and Associated Events v2.3 (April 2016) with a hypopnea requiring 4% desaturations.  The channels recorded and monitored were frontal, central and occipital EEG, electrooculogram (EOG), submentalis EMG (chin), nasal and oral airflow, thoracic and abdominal wall motion, anterior tibialis EMG, snore microphone, electrocardiogram, and pulse oximetry. Bilevel positive airway pressure (BPAP) was initiated at the beginning of the study and titrated to treat sleep-disordered breathing.  MEDICATIONS Medications self-administered by patient taken the night of the study : COREG, EXEMESTANE, HYDRALAZINE, PAXIL, PLAVIX, REMERON, TYLENOL, viberzi  RESPIRATORY PARAMETERS Optimal IPAP Pressure (cm): 23 AHI at Optimal Pressure (/hr) 3.6 Optimal EPAP Pressure (cm): 19   Overall Minimal O2 (%): 83.0 Minimal O2 at Optimal Pressure (%): 85.0 SLEEP ARCHITECTURE Start Time: 10:54:50 PM Stop Time: 5:09:19 AM Total Time (min): 374.5 Total Sleep Time (min): 312.5 Sleep Latency (min): 36.4 Sleep Efficiency (%): 83.5% REM Latency (min): 256.0 WASO (min): 25.6 Stage N1 (%): 12.8% Stage N2 (%): 64.6% Stage N3 (%): 0.0% Stage R (%): 22.6 Supine (%): 45.92 Arousal Index (/hr): 29.6     CARDIAC DATA The 2 lead EKG demonstrated sinus rhythm. The mean heart rate was 79.3 beats per minute. Other EKG findings include: None.   LEG  MOVEMENT DATA The total Periodic Limb Movements of Sleep (PLMS) were 0. The PLMS index was 0.0. A PLMS index of <15 is considered normal in adults.  IMPRESSIONS - An optimal PAP pressure was selected for this patient ( 23 / 19 cm of water). CPAP was tried upto 19 cm but events persisted - Central sleep apnea was not noted during this titration (CAI = 4.8/h). - Moderete oxygen desaturations were observed during this titration (min O2 = 83.0%). - The patient snored with moderate snoring volume. - No cardiac abnormalities were observed during this study. - Clinically significant periodic limb movements were not noted during this study. Arousals associated with PLMs were rare.   DIAGNOSIS - Obstructive Sleep Apnea (G47.33)   RECOMMENDATIONS - Trial of BiPAP therapy on 23/19 cm H2O with a Large size [employer] Full Face Evora Full mask and heated humidification. Preferably use auto bipap for better tolerance, can use EPAP min 10, PS +4cm, IPAP max 23 cm - Avoid alcohol, sedatives and other CNS depressants that may worsen sleep apnea and disrupt normal sleep architecture. - Sleep hygiene should be reviewed to assess factors that may improve sleep quality. - Weight management and regular exercise should be initiated or continued. - Return to Sleep Center for re-evaluation after 4 weeks of therapy  [Electronically signed] 10/02/2022 04:33 PM  Cyril Mourning MD, ABSM Diplomate, American Board of Sleep Medicine NPI: [id number]

## 2022-10-26 ENCOUNTER — Encounter (HOSPITAL_COMMUNITY): Payer: Self-pay | Admitting: *Deleted

## 2022-10-26 ENCOUNTER — Emergency Department (HOSPITAL_COMMUNITY): Payer: Medicare Other

## 2022-10-26 ENCOUNTER — Other Ambulatory Visit: Payer: Self-pay

## 2022-10-26 ENCOUNTER — Observation Stay (HOSPITAL_COMMUNITY)
Admission: EM | Admit: 2022-10-26 | Discharge: 2022-10-27 | Disposition: A | Discharge status: Home / Self Care | Payer: Medicare Other | Attending: Family Medicine | Admitting: Family Medicine

## 2022-10-26 DIAGNOSIS — Z853 Personal history of malignant neoplasm of breast: Secondary | ICD-10-CM | POA: Diagnosis not present

## 2022-10-26 DIAGNOSIS — F419 Anxiety disorder, unspecified: Secondary | ICD-10-CM | POA: Diagnosis not present

## 2022-10-26 DIAGNOSIS — J9601 Acute respiratory failure with hypoxia: Secondary | ICD-10-CM | POA: Diagnosis not present

## 2022-10-26 DIAGNOSIS — Z79899 Other long term (current) drug therapy: Secondary | ICD-10-CM | POA: Insufficient documentation

## 2022-10-26 DIAGNOSIS — I10 Essential (primary) hypertension: Secondary | ICD-10-CM | POA: Diagnosis not present

## 2022-10-26 DIAGNOSIS — Z7902 Long term (current) use of antithrombotics/antiplatelets: Secondary | ICD-10-CM | POA: Insufficient documentation

## 2022-10-26 DIAGNOSIS — R0789 Other chest pain: Secondary | ICD-10-CM | POA: Diagnosis present

## 2022-10-26 DIAGNOSIS — J449 Chronic obstructive pulmonary disease, unspecified: Secondary | ICD-10-CM | POA: Diagnosis not present

## 2022-10-26 DIAGNOSIS — Z96652 Presence of left artificial knee joint: Secondary | ICD-10-CM | POA: Insufficient documentation

## 2022-10-26 DIAGNOSIS — R0602 Shortness of breath: Secondary | ICD-10-CM | POA: Diagnosis not present

## 2022-10-26 DIAGNOSIS — Z87891 Personal history of nicotine dependence: Secondary | ICD-10-CM | POA: Diagnosis not present

## 2022-10-26 DIAGNOSIS — Z8673 Personal history of transient ischemic attack (TIA), and cerebral infarction without residual deficits: Secondary | ICD-10-CM | POA: Diagnosis not present

## 2022-10-26 DIAGNOSIS — K219 Gastro-esophageal reflux disease without esophagitis: Secondary | ICD-10-CM | POA: Insufficient documentation

## 2022-10-26 DIAGNOSIS — R079 Chest pain, unspecified: Principal | ICD-10-CM

## 2022-10-26 LAB — TROPONIN I (HIGH SENSITIVITY)
Troponin I (High Sensitivity): 18 ng/L — ABNORMAL HIGH (ref <18)
Troponin I (High Sensitivity): 38 ng/L — ABNORMAL HIGH (ref <18)
Troponin I (High Sensitivity): 67 ng/L — ABNORMAL HIGH (ref <18)
Troponin I (High Sensitivity): 77 ng/L — ABNORMAL HIGH (ref <18)

## 2022-10-26 LAB — BASIC METABOLIC PANEL
Anion gap: 7 (ref 5–15)
BUN: 18 mg/dL (ref 8–23)
CO2: 30 mmol/L (ref 22–32)
Calcium: 8.9 mg/dL (ref 8.9–10.3)
Chloride: 99 mmol/L (ref 98–111)
Creatinine, Ser: 0.79 mg/dL (ref 0.44–1.00)
GFR, Estimated: >60 mL/min (ref >60)
Glucose, Bld: 147 mg/dL — ABNORMAL HIGH (ref 70–99)
Potassium: 3.5 mmol/L (ref 3.5–5.1)
Sodium: 136 mmol/L (ref 135–145)

## 2022-10-26 LAB — CBC
HCT: 40.8 % (ref 36.0–46.0)
Hemoglobin: 13.4 g/dL (ref 12.0–15.0)
MCH: 32 pg (ref 26.0–34.0)
MCHC: 32.8 g/dL (ref 30.0–36.0)
MCV: 97.4 fL (ref 80.0–100.0)
Platelets: 212 10*3/uL (ref 150–400)
RBC: 4.19 MIL/uL (ref 3.87–5.11)
RDW: 12.9 % (ref 11.5–15.5)
WBC: 4.9 10*3/uL (ref 4.0–10.5)
nRBC: 0 % (ref 0.0–0.2)

## 2022-10-26 LAB — MAGNESIUM: Magnesium: 1.9 mg/dL (ref 1.7–2.4)

## 2022-10-26 LAB — LIPASE, BLOOD: Lipase: 33 U/L (ref 11–51)

## 2022-10-26 LAB — PROCALCITONIN: Procalcitonin: <0.1 ng/mL

## 2022-10-26 LAB — BRAIN NATRIURETIC PEPTIDE: B Natriuretic Peptide: 29 pg/mL (ref 0.0–100.0)

## 2022-10-26 MED ORDER — CLOPIDOGREL BISULFATE 75 MG PO TABS
75.0000 mg | ORAL_TABLET | Freq: Every evening | ORAL | Status: DC
  Administered 2022-10-26: 75 mg via ORAL
  Filled 2022-10-26: qty 1

## 2022-10-26 MED ORDER — LACTATED RINGERS IV BOLUS
500.0000 mL | Freq: Once | INTRAVENOUS | Status: AC
  Administered 2022-10-26: 500 mL via INTRAVENOUS

## 2022-10-26 MED ORDER — IOHEXOL 350 MG/ML SOLN
75.0000 mL | Freq: Once | INTRAVENOUS | Status: AC | PRN
  Administered 2022-10-26: 75 mL via INTRAVENOUS

## 2022-10-26 MED ORDER — CARVEDILOL 12.5 MG PO TABS
25.0000 mg | ORAL_TABLET | Freq: Two times a day (BID) | ORAL | Status: DC
  Administered 2022-10-27: 25 mg via ORAL
  Filled 2022-10-26: qty 2

## 2022-10-26 MED ORDER — ASPIRIN 81 MG PO CHEW
324.0000 mg | CHEWABLE_TABLET | Freq: Once | ORAL | Status: AC
  Administered 2022-10-26: 324 mg via ORAL
  Filled 2022-10-26: qty 4

## 2022-10-26 MED ORDER — ONDANSETRON HCL 4 MG PO TABS: 4.0000 mg | ORAL_TABLET | Freq: Four times a day (QID) | ORAL | Status: DC | PRN

## 2022-10-26 MED ORDER — ONDANSETRON HCL 4 MG/2ML IJ SOLN: 4.0000 mg | Freq: Four times a day (QID) | INTRAMUSCULAR | Status: DC | PRN

## 2022-10-26 MED ORDER — ONDANSETRON HCL 4 MG/2ML IJ SOLN
4.0000 mg | Freq: Once | INTRAMUSCULAR | Status: AC
  Administered 2022-10-26: 4 mg via INTRAVENOUS
  Filled 2022-10-26: qty 2

## 2022-10-26 MED ORDER — ACETAMINOPHEN 650 MG RE SUPP: 650.0000 mg | Freq: Four times a day (QID) | RECTAL | Status: DC | PRN

## 2022-10-26 MED ORDER — HYDRALAZINE HCL 25 MG PO TABS
100.0000 mg | ORAL_TABLET | Freq: Three times a day (TID) | ORAL | Status: DC
  Administered 2022-10-26 – 2022-10-27 (×3): 100 mg via ORAL
  Filled 2022-10-26 (×3): qty 4

## 2022-10-26 MED ORDER — OXYCODONE HCL 5 MG PO TABS: 5.0000 mg | ORAL_TABLET | ORAL | Status: DC | PRN

## 2022-10-26 MED ORDER — LISINOPRIL 10 MG PO TABS
40.0000 mg | ORAL_TABLET | Freq: Every day | ORAL | Status: DC
  Administered 2022-10-27: 40 mg via ORAL
  Filled 2022-10-26: qty 4

## 2022-10-26 MED ORDER — PANTOPRAZOLE SODIUM 20 MG PO TBEC
20.0000 mg | DELAYED_RELEASE_TABLET | Freq: Every day | ORAL | Status: DC
  Filled 2022-10-26 (×2): qty 1

## 2022-10-26 MED ORDER — LEVOFLOXACIN IN D5W 750 MG/150ML IV SOLN
750.0000 mg | INTRAVENOUS | Status: DC
  Administered 2022-10-26: 750 mg via INTRAVENOUS
  Filled 2022-10-26: qty 150

## 2022-10-26 MED ORDER — PAROXETINE HCL 20 MG PO TABS
20.0000 mg | ORAL_TABLET | Freq: Every day | ORAL | Status: DC
  Administered 2022-10-26: 20 mg via ORAL
  Filled 2022-10-26: qty 1

## 2022-10-26 MED ORDER — HEPARIN SODIUM (PORCINE) 5000 UNIT/ML IJ SOLN
5000.0000 [IU] | Freq: Three times a day (TID) | INTRAMUSCULAR | Status: DC
  Administered 2022-10-26 – 2022-10-27 (×2): 5000 [IU] via SUBCUTANEOUS
  Filled 2022-10-26 (×2): qty 1

## 2022-10-26 MED ORDER — HYDRALAZINE HCL 100 MG PO TABS: 100.0000 mg | ORAL_TABLET | Freq: Three times a day (TID) | ORAL | Status: DC

## 2022-10-26 MED ORDER — MORPHINE SULFATE (PF) 2 MG/ML IV SOLN: 2.0000 mg | INTRAVENOUS | Status: DC | PRN

## 2022-10-26 MED ORDER — ACETAMINOPHEN 325 MG PO TABS: 650.0000 mg | ORAL_TABLET | Freq: Four times a day (QID) | ORAL | Status: DC | PRN

## 2022-10-26 NOTE — ED Provider Notes (Signed)
[place] EMERGENCY DEPARTMENT AT [facility] Provider Note   CSN: [id number] Arrival date & time: 10/26/22  1355     History  Chief Complaint  Patient presents with   Chest Pain    Breanna [name] is a 77 y.o. female.   Chest Pain Associated symptoms: fatigue, nausea and shortness of breath   Patient presents for chest pain.  Medical history includes arthritis, TIA, GERD, HTN, hiatal hernia, COPD, OSA, arthritis, breast cancer.  Last oncology visit was in February.  At the time, she was on exemestane.  She continues to take this.  She has been told that her cancer is in remission.  Over the past several days, she has had intermittent symptoms of central chest pain radiating to back, nausea, bilateral hand paresthesias, perioral paresthesias.  This morning, symptoms seemed more severe.  She went to urgent care and waited there for an hour without being seen.  She felt so unwell that she left and came to the ED.  Since that time, symptoms have resolved.  She denies current pain.  She does have current mild nausea.  She denies pleurisy.     Home Medications Prior to Admission medications   Medication Sig Start Date End Date Taking? Authorizing Provider  acetaminophen (TYLENOL) 500 MG tablet Take 1,000 mg by mouth 3 (three) times daily as needed for headache (pain).    Yes [provider]  albuterol (VENTOLIN HFA) 108 (90 Base) MCG/ACT inhaler Inhale 1-2 puffs into the lungs every 6 (six) hours as needed. 07/27/22  Yes Parrett, Tammy S, NP  bumetanide (BUMEX) 1 MG tablet Take 1 mg by mouth daily. 02/28/22  Yes [provider]  carvedilol (COREG) 25 MG tablet Take 1 tablet (25 mg total) by mouth 2 (two) times daily with a meal. 03/17/18  Yes Serena Croissant, MD  cetirizine (ZYRTEC) 10 MG tablet Take 10 mg by mouth daily.   Yes [provider]  clopidogrel (PLAVIX) 75 MG tablet Take 1 tablet (75 mg total) by mouth every evening. Patient taking differently:  Take 75 mg by mouth daily. 03/17/18  Yes Serena Croissant, MD  Eluxadoline (VIBERZI) 75 MG TABS Take 1 tablet (75 mg total) by mouth in the morning and at bedtime. With food 08/06/22 11/04/22 Yes Mahon, Frederik Schmidt, NP  exemestane (AROMASIN) 25 MG tablet Take 25 mg by mouth at bedtime.   Yes [provider]  hydrALAZINE (APRESOLINE) 100 MG tablet Take 1 tablet by mouth 3 (three) times daily.   Yes [provider]  lansoprazole (PREVACID) 15 MG capsule Take 15 mg by mouth in the morning.   Yes [provider]  lisinopril (PRINIVIL,ZESTRIL) 40 MG tablet Take 40 mg by mouth daily.   Yes [provider]  PARoxetine (PAXIL) 20 MG tablet Take 1 tablet (20 mg total) by mouth at bedtime. 05/04/19  Yes Serena Croissant, MD  traMADol (ULTRAM) 50 MG tablet TAKE 1 TABLET BY MOUTH EVERY 6 HOURS AS NEEDED FOR MILD PAIN Patient taking differently: Take 50 mg by mouth every 6 (six) hours as needed for moderate pain. 09/16/21  Yes Serena Croissant, MD      Allergies    Atorvastatin, Vancomycin, Ancef [cefazolin], Lactose, Nasonex [mometasone furoate], and Neurontin [gabapentin]    Review of Systems   Review of Systems  Constitutional:  Positive for fatigue.  Respiratory:  Positive for shortness of breath.   Cardiovascular:  Positive for chest pain.  Gastrointestinal:  Positive for nausea.  All other  systems reviewed and are negative.   Physical Exam Updated Vital Signs BP 117/83   Pulse 86   Temp 98.4 F (36.9 C) (Oral)   Resp 18   Ht 5' 4.5" (1.638 m)   Wt 102.1 kg   SpO2 95%   BMI 38.02 kg/m  Physical Exam Vitals and nursing note reviewed.  Constitutional:      General: She is not in acute distress.    Appearance: She is well-developed. She is not ill-appearing, toxic-appearing or diaphoretic.  HENT:     Head: Normocephalic and atraumatic.  Eyes:     Conjunctiva/sclera: Conjunctivae normal.  Cardiovascular:     Rate and Rhythm: Normal rate and regular rhythm.      Heart sounds: No murmur heard.    No friction rub.  Pulmonary:     Effort: Pulmonary effort is normal. No respiratory distress.     Breath sounds: Normal breath sounds. No decreased breath sounds, wheezing, rhonchi or rales.  Chest:     Chest wall: No tenderness.  Abdominal:     Palpations: Abdomen is soft.     Tenderness: There is no abdominal tenderness.  Musculoskeletal:        General: No swelling. Normal range of motion.     Cervical back: Normal range of motion and neck supple.     Right lower leg: No edema.     Left lower leg: No edema.  Skin:    General: Skin is warm and dry.     Coloration: Skin is not cyanotic or pale.  Neurological:     General: No focal deficit present.     Mental Status: She is alert and oriented to person, place, and time.  Psychiatric:        Mood and Affect: Mood normal.        Behavior: Behavior normal.     ED Results / Procedures / Treatments   Labs (all labs ordered are listed, but only abnormal results are displayed) Labs Reviewed  BASIC METABOLIC PANEL - Abnormal; Notable for the following components:      Result Value   Glucose, Bld 147 (*)    All other components within normal limits  TROPONIN I (HIGH SENSITIVITY) - Abnormal; Notable for the following components:   Troponin I (High Sensitivity) 18 (*)    All other components within normal limits  TROPONIN I (HIGH SENSITIVITY) - Abnormal; Notable for the following components:   Troponin I (High Sensitivity) 38 (*)    All other components within normal limits  CBC  BRAIN NATRIURETIC PEPTIDE  MAGNESIUM  LIPASE, BLOOD  PROCALCITONIN  TROPONIN I (HIGH SENSITIVITY)    EKG EKG Interpretation Date/Time:  Monday October 26 2022 14:12:59 EDT Ventricular Rate:  105 PR Interval:  144 QRS Duration:  92 QT Interval:  358 QTC Calculation: 473 R Axis:   -38  Text Interpretation: Sinus tachycardia Left axis deviation Moderate voltage criteria for LVH, may be normal variant ( R in aVL ,  Cornell product ) Septal infarct (cited on or before 30-Aug-2021) Abnormal ECG Confirmed by Gloris Manchester ([id number]) on 10/26/2022 3:55:38 PM  Radiology CT Angio Chest PE W and/or Wo Contrast  Result Date: 10/26/2022 CLINICAL DATA:  Pulmonary embolism (PE) suspected, high prob EXAM: CT ANGIOGRAPHY CHEST WITH CONTRAST TECHNIQUE: Multidetector CT imaging of the chest was performed using the standard protocol during bolus administration of intravenous contrast. Multiplanar CT image reconstructions and MIPs were obtained to evaluate the vascular anatomy. RADIATION DOSE REDUCTION: This exam  was performed according to the departmental dose-optimization program which includes automated exposure control, adjustment of the mA and/or kV according to patient size and/or use of iterative reconstruction technique. CONTRAST:  75mL OMNIPAQUE IOHEXOL 350 MG/ML SOLN COMPARISON:  11/24/2021, 08/05/2021 FINDINGS: Cardiovascular: Satisfactory opacification of the pulmonary arteries to the segmental level. No evidence of pulmonary embolism. Thoracic aorta is nonaneurysmal. Scattered atherosclerotic vascular calcifications of the aorta and coronary arteries. Normal heart size. No pericardial effusion. Mediastinum/Nodes: No enlarged mediastinal, hilar, or axillary lymph nodes. Thyroid gland, trachea, and esophagus demonstrate no significant findings. Lungs/Pleura: Stable areas of clustered nodules within the posterior aspect of the right upper lobe (series 6, image 37) and within the peripheral aspect of the left upper lobe (series 6, image 38). Probable radiation changes within the anterior aspect of the left upper lobe. No new or enlarging pulmonary nodule. No focal airspace consolidation, pleural effusion, or pneumothorax. 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 No acute bony  abnormality or suspicious bone lesion. Review of the MIP images confirms the above findings. IMPRESSION: 1. No evidence of pulmonary embolism or other acute intrathoracic findings. 2. Stable areas of clustered nodules within the upper lobes bilaterally, likely infectious/inflammatory in etiology. No new or enlarging pulmonary nodule. 3. 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 In the absence of uncontrolled hypertension, recommend imaging surveillance Q 1-2 years. In setting of uncontrolled hypertension, aneurysms > 1 cm should be referred for Interventional Radiology consultation. 4. Aortic and coronary artery atherosclerosis (ICD10-I70.0). Electronically Signed   By: Duanne Guess D.O.   On: 10/26/2022 18:24   DG Chest 2 View  Result Date: 10/26/2022 CLINICAL DATA:  Chest pain EXAM: CHEST - 2 VIEW COMPARISON:  09/30/2022 FINDINGS: The heart size and mediastinal contours are within normal limits. Mild streaky opacity within the lingula. No pleural effusion or pneumothorax. Multilevel thoracic spondylosis. IMPRESSION: Mild streaky opacity within the lingula, atelectasis versus infiltrate. Electronically Signed   By: Duanne Guess D.O.   On: 10/26/2022 16:28    Procedures Procedures    Medications Ordered in ED Medications  aspirin chewable tablet 324 mg (has no administration in time range)  levofloxacin (LEVAQUIN) IVPB 750 mg (has no administration in time range)  ondansetron (ZOFRAN) injection 4 mg (4 mg Intravenous Given 10/26/22 1701)  lactated ringers bolus 500 mL (500 mLs Intravenous New Bag/Given 10/26/22 1701)  iohexol (OMNIPAQUE) 350 MG/ML injection 75 mL (75 mLs Intravenous Contrast Given 10/26/22 1655)    ED Course/ Medical Decision Making/ A&P                                 Medical Decision Making Amount and/or Complexity of Data Reviewed Labs: ordered. Radiology: ordered.  Risk OTC drugs. Prescription drug management. Decision regarding  hospitalization.   This patient presents to the ED for concern of chest pain and shortness of breath, this involves an extensive number of treatment options, and is a complaint that carries with it a high risk of complications and morbidity.  The differential diagnosis includes ACS, PE, pneumonia, reactive airway disease, pulmonary edema   Co morbidities that complicate the patient evaluation  arthritis, TIA, GERD, HTN, hiatal hernia, COPD, OSA, arthritis, breast cancer   Additional history obtained:  Additional history obtained from N/A External records from outside source obtained  and reviewed including EMR   Lab Tests:  I Ordered, and personally interpreted labs.  The pertinent results include: Normal creatinine, normal electrolytes, normal hemoglobin, no leukocytosis, normal BNP.  Troponins mildly increasing from 18-38   Imaging Studies ordered:  I ordered imaging studies including chest x-ray, CTA chest I independently visualized and interpreted imaging which showed no evidence of PE.  Nonspecific biapical inflammatory nodules. I agree with the radiologist interpretation   Cardiac Monitoring: / EKG:  The patient was maintained on a cardiac monitor.  I personally viewed and interpreted the cardiac monitored which showed an underlying rhythm of: Sinus rhythm   Consultations Obtained:  I requested consultation with the cardiologist, Dr. Eden Emms,  and discussed lab and imaging findings as well as pertinent plan - they recommend: Admission to [facility], trend troponins, cardiology consultation in the morning, no heparin unless troponin increases dramatically.   Problem List / ED Course / Critical interventions / Medication management  Patient presents for intermittent symptoms of chest pain, nausea, shortness of breath, or seizures over the past 3 days.  Symptoms were worsened today but have since resolved.  On arrival in the ED, she is mildly tachycardic and tachypneic.  On  exam, she is well-appearing.  At rest, her tachycardia has resolved.  She currently denies any chest pain or pleurisy.  She does endorse some mild nausea.  Abdomen soft and nontender.  Breathing is unlabored and lungs are clear to auscultation.  Initial lab work, shows normal hemoglobin, no leukocytosis, normal kidney function, normal electrolytes.  Troponin is high-normal.  While in the ED, patient had hypoxia with SpO2 in the high 80s on room air.  She was placed on supplemental oxygen.  CTA of chest was ordered.  Zofran was given for nausea.  CT did not show evidence of PE.  Repeat troponin did show uptrend.  I spoke with cardiologist on-call about this who recommends admission here at [facility].  Patient remained chest pain-free while in the ED.  She was admitted for further management. I ordered medication including Zofran for nausea; ASA for mild elevation in troponins, IV fluids for hydration Reevaluation of the patient after these medicines showed that the patient improved I have reviewed the patients home medicines and have made adjustments as needed   Social Determinants of Health:  Has access to outpatient care  CRITICAL CARE Performed by: Gloris Manchester   Total critical care time: 32 minutes  Critical care time was exclusive of separately billable procedures and treating other patients.  Critical care was necessary to treat or prevent imminent or life-threatening deterioration.  Critical care was time spent personally by me on the following activities: development of treatment plan with patient and/or surrogate as well as nursing, discussions with consultants, evaluation of patient's response to treatment, examination of patient, obtaining history from patient or surrogate, ordering and performing treatments and interventions, ordering and review of laboratory studies, ordering and review of radiographic studies, pulse oximetry and re-evaluation of patient's  condition.'        Final Clinical Impression(s) / ED Diagnoses Final diagnoses:  Chest pain, unspecified type  Acute respiratory failure with hypoxia [facility])    Rx / DC Orders ED Discharge Orders     None         Gloris Manchester, MD 10/26/22 1945
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
## 2022-10-26 NOTE — Assessment & Plan Note (Signed)
-   Story is concerning - She had a pressure and aching pain that was mostly present between her shoulder blades but also in the center of her chest - She has associated nausea, fatigue, lightheadedness - Reports feeling generalized weakness and fatigue all day yesterday, and the chest pain started today - Troponin initially 18, then 38 - EKG shows sinus tachycardia with a heart rate of 105, QTc 473 - Cardiology consulted and recommends cycling troponins and for cardiology consult inpatient tomorrow - Monitor on telemetry

## 2022-10-26 NOTE — Assessment & Plan Note (Signed)
Continue Paxil.

## 2022-10-26 NOTE — Assessment & Plan Note (Signed)
Continue PPI ?

## 2022-10-26 NOTE — ED Notes (Signed)
MD aware of repeat troponin value. No new orders at this time.

## 2022-10-26 NOTE — Assessment & Plan Note (Signed)
-   Post radiation and mastectomy - Reports scarring on lungs and had required oxygen after the radiation and completed in the past
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
## 2022-10-26 NOTE — ED Provider Triage Note (Signed)
Emergency Medicine Provider Triage Evaluation Note  Breanna [name] , a 77 y.o. female  was evaluated in triage.  Pt complains of pain anxiety going on for several days having nausea, complaining of bilateral hand tingling and perioral numbness as well.  States she has been very stressed out and her husband is dying of cancer and they are also in the process of moving.  Has some mild ankle swelling but states this is her baseline..  Review of Systems  Positive: Chest pain, SOB Negative: Cough, fever  Physical Exam  BP (!) 141/61 (BP Location: Right Arm)   Pulse (!) 106   Temp 98.4 F (36.9 C) (Oral)   Resp (!) 22   Ht 5' 4.5" (1.638 m)   Wt 102.1 kg   SpO2 95%   BMI 38.02 kg/m  Gen:   Awake, no distress   Resp:  Normal effort  MSK:   Moves extremities without difficulty  Other:    Medical Decision Making  Medically screening exam initiated at 2:33 PM.  Appropriate orders placed.  Breanna [name] was informed that the remainder of the evaluation will be completed by another provider, this initial triage assessment does not replace that evaluation, and the importance of remaining in the ED until their evaluation is complete.     Ma Rings, [place] 10/26/22 1439

## 2022-10-26 NOTE — ED Triage Notes (Signed)
Pt c/o chest pain that radiates around to right side of her back; pt states she feels nervous and nauseous  These sx started a couple of days ago

## 2022-10-26 NOTE — ED Notes (Signed)
Pt noted to have oxygen saturations of 86-89%. Placed on 2L [place] with improvement to 95%. MD notified.

## 2022-10-26 NOTE — Assessment & Plan Note (Signed)
-   No oxygen requirement at baseline at this time - Patient has required oxygen in the past after her radiation for breast cancer - No cough, no fever - Borderline leukopenia with a white blood cell count of 4.9 - Procalcitonin pending - CTA shows nodules that are infectious versus inflammatory -these are presumed to be infectious at this time but could also be scarring from her radiation - No PE - Chest x-ray shows mildly streaky opacification within the lingula - Troponins mildly elevated - Continue Levaquin as patient has a cephalosporin allergy - Cycle troponins - Wean off O2 as tolerated - Continue to monitor

## 2022-10-26 NOTE — Assessment & Plan Note (Signed)
Continue hydralazine and lisinopril.

## 2022-10-27 ENCOUNTER — Inpatient Hospital Stay (HOSPITAL_BASED_OUTPATIENT_CLINIC_OR_DEPARTMENT_OTHER): Payer: Medicare Other

## 2022-10-27 ENCOUNTER — Encounter (HOSPITAL_COMMUNITY): Payer: Self-pay | Admitting: Family Medicine

## 2022-10-27 DIAGNOSIS — R0789 Other chest pain: Secondary | ICD-10-CM | POA: Diagnosis not present

## 2022-10-27 DIAGNOSIS — J9601 Acute respiratory failure with hypoxia: Secondary | ICD-10-CM | POA: Diagnosis not present

## 2022-10-27 DIAGNOSIS — I10 Essential (primary) hypertension: Secondary | ICD-10-CM | POA: Diagnosis not present

## 2022-10-27 DIAGNOSIS — Z8673 Personal history of transient ischemic attack (TIA), and cerebral infarction without residual deficits: Secondary | ICD-10-CM

## 2022-10-27 DIAGNOSIS — R079 Chest pain, unspecified: Secondary | ICD-10-CM | POA: Diagnosis not present

## 2022-10-27 DIAGNOSIS — G4733 Obstructive sleep apnea (adult) (pediatric): Secondary | ICD-10-CM

## 2022-10-27 LAB — COMPREHENSIVE METABOLIC PANEL
ALT: 24 U/L (ref 0–44)
AST: 24 U/L (ref 15–41)
Albumin: 3.3 g/dL — ABNORMAL LOW (ref 3.5–5.0)
Alkaline Phosphatase: 73 U/L (ref 38–126)
Anion gap: 8 (ref 5–15)
BUN: 15 mg/dL (ref 8–23)
CO2: 30 mmol/L (ref 22–32)
Calcium: 8.5 mg/dL — ABNORMAL LOW (ref 8.9–10.3)
Chloride: 100 mmol/L (ref 98–111)
Creatinine, Ser: 0.72 mg/dL (ref 0.44–1.00)
GFR, Estimated: >60 mL/min (ref >60)
Glucose, Bld: 135 mg/dL — ABNORMAL HIGH (ref 70–99)
Potassium: 3.6 mmol/L (ref 3.5–5.1)
Sodium: 138 mmol/L (ref 135–145)
Total Bilirubin: 0.4 mg/dL (ref 0.3–1.2)
Total Protein: 5.9 g/dL — ABNORMAL LOW (ref 6.5–8.1)

## 2022-10-27 LAB — CBC WITH DIFFERENTIAL/PLATELET
Abs Immature Granulocytes: 0.01 10*3/uL (ref 0.00–0.07)
Basophils Absolute: 0 10*3/uL (ref 0.0–0.1)
Basophils Relative: 1 %
Eosinophils Absolute: 0.1 10*3/uL (ref 0.0–0.5)
Eosinophils Relative: 2 %
HCT: 39.6 % (ref 36.0–46.0)
Hemoglobin: 13 g/dL (ref 12.0–15.0)
Immature Granulocytes: 0 %
Lymphocytes Relative: 39 %
Lymphs Abs: 1.6 10*3/uL (ref 0.7–4.0)
MCH: 32.3 pg (ref 26.0–34.0)
MCHC: 32.8 g/dL (ref 30.0–36.0)
MCV: 98.3 fL (ref 80.0–100.0)
Monocytes Absolute: 0.6 10*3/uL (ref 0.1–1.0)
Monocytes Relative: 15 %
Neutro Abs: 1.9 10*3/uL (ref 1.7–7.7)
Neutrophils Relative %: 43 %
Platelets: 186 10*3/uL (ref 150–400)
RBC: 4.03 MIL/uL (ref 3.87–5.11)
RDW: 12.9 % (ref 11.5–15.5)
WBC: 4.2 10*3/uL (ref 4.0–10.5)
nRBC: 0 % (ref 0.0–0.2)

## 2022-10-27 LAB — ECHOCARDIOGRAM COMPLETE
Area-P 1/2: 2.11 cm2
Height: 64 in
S' Lateral: 2.4 cm
Weight: 3555.58 oz

## 2022-10-27 LAB — MAGNESIUM: Magnesium: 1.8 mg/dL (ref 1.7–2.4)

## 2022-10-27 LAB — TROPONIN I (HIGH SENSITIVITY)
Troponin I (High Sensitivity): 53 ng/L — ABNORMAL HIGH (ref <18)
Troponin I (High Sensitivity): 76 ng/L — ABNORMAL HIGH (ref <18)
Troponin I (High Sensitivity): 86 ng/L — ABNORMAL HIGH (ref <18)

## 2022-10-27 MED ORDER — LANSOPRAZOLE 15 MG PO CPDR: 15.0000 mg | DELAYED_RELEASE_CAPSULE | Freq: Two times a day (BID) | ORAL | 0 refills | Status: DC

## 2022-10-27 MED ORDER — PANTOPRAZOLE SODIUM 40 MG PO TBEC
40.0000 mg | DELAYED_RELEASE_TABLET | Freq: Every day | ORAL | Status: DC
  Administered 2022-10-27: 40 mg via ORAL
  Filled 2022-10-27: qty 1

## 2022-10-27 NOTE — Care Management Obs Status (Signed)
MEDICARE OBSERVATION STATUS NOTIFICATION   Patient Details  Name: Breanna [name] MRN: [id number] Date of Birth: 07/16/1945   Medicare Observation Status Notification Given:  Yes    Leitha Bleak, RN 10/27/2022, 1:44 PM

## 2022-10-27 NOTE — Hospital Course (Signed)
Breanna [name] is a 77 y.o. female with medical history significant of left-sided breast cancer, GERD, hypertension, anxiety, and more presents the ED with a chief complaint of chest pain.  Patient reports the chest pain started about 2 hours prior to arrival.  It is mostly back pain between her shoulder blades.  She describes it as pressure and achy.  It is also in the center of her chest but to a lesser degree.  She has had some lightheadedness.  Patient reports that it feels very similar to when she has had hypoglycemic episodes in the past.  Her glucose was 147 when she arrived.  Patient reports associated nausea.  She reports the chest pain started today but yesterday she slept all day with generalized weakness, fatigue, nausea, and just feeling terrible.  She reports no cough no fever.  She has no other complaints at this time.   Patient does not smoke and does not drink.  We reviewed the wishes of her ACP documents together.  She would like to be full code, but would not want to be kept on long-term life support.  ED Temp 98.4, heart rate 84-106, respiratory rate 18-22, blood pressure 117/59-156/83, satting 86-93% on 2 L nasal cannula Borderline leukopenia at 4.9, hemoglobin 13.4 Chemistry is unremarkable Mag 1.9 CTA shows no PE but does show some nodules that are infectious versus inflammatory Troponin 18, 38 Chest x-ray shows mildly streaky opacification in the lingula EKG shows heart rate 105, sinus tach, QTc 473 Patient was given a 500 mL bolus, Zofran, aspirin Cardiology was consulted recommended admission to [facility] and for cards to see her in the a.m.

## 2022-10-27 NOTE — Care Management CC44 (Signed)
Condition Code 44 Documentation Completed  Patient Details  Name: FLORIAN LITE MRN: [id number] Date of Birth: May 12, 1945   Condition Code 44 given:  Yes Patient signature on Condition Code 44 notice:  Yes Documentation of 2 MD's agreement:  Yes Code 44 added to claim:  Yes    Leitha Bleak, RN 10/27/2022, 1:44 PM

## 2022-10-27 NOTE — Progress Notes (Signed)
Mobility Specialist Progress Note:   10/27/22 1244  Mobility  Activity Ambulated with assistance in hallway  Level of Assistance Modified independent, requires aide device or extra time  Assistive Device Other (Comment) (Handrails)  Distance Ambulated (ft) 120 ft  Range of Motion/Exercises Active;All extremities  Activity Response Tolerated well  Mobility Referral Yes  $Mobility charge 1 Mobility  Mobility Specialist Start Time (ACUTE ONLY) 1230  Mobility Specialist Stop Time (ACUTE ONLY) 1244  Mobility Specialist Time Calculation (min) (ACUTE ONLY) 14 min   Pt received supine in bed, husband at bedside. Agreeable to mobility session. Pt able to sit EOB and stand at bedside independently. Ambulated in hallway, ModI (used hand rails for stability). SpO2 88% on RA throughout session. Returned pt to room, sitting EOB, SpO2 96% on RA. Left pt in room, all needs met, call bell in reach.    Feliciana Rossetti Mobility Specialist Please contact via Special educational needs teacher or  Rehab office at [phone]

## 2022-10-27 NOTE — Progress Notes (Signed)
*  PRELIMINARY RESULTS* Echocardiogram 2D Echocardiogram has been performed.  Stacey Drain 10/27/2022, 2:22 PM
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
## 2022-10-27 NOTE — Discharge Summary (Signed)
Physician Discharge Summary   Patient: Breanna [name] MRN: [id number] DOB: 03-Dec-1945  Admit date:     10/26/2022  Discharge date: 10/27/22  Discharge Physician: Breanna [name]   PCP: Breanna Gerlach, FNP   Recommendations at discharge:   Follow-up with PCP in 1 week Follow-up with a cardiologist in 1-2 weeks, for further cardiac evaluation and outpatient cardiac stress test  Discharge Diagnoses: Principal Problem:   Acute respiratory failure with hypoxia (HCC) Active Problems:   History of left breast cancer   Chest pain   Essential hypertension   GERD (gastroesophageal reflux disease)   Anxiety  Resolved Problems:   COPD (chronic obstructive pulmonary disease) [facility])  Hospital Course: Breanna [name] is a 77 y.o. female with medical history significant of left-sided breast cancer, GERD, hypertension, anxiety, and more presents the ED with a chief complaint of chest pain.  Patient reports the chest pain started about 2 hours prior to arrival.  It is mostly back pain between her shoulder blades.  She describes it as pressure and achy.  It is also in the center of her chest but to a lesser degree.  She has had some lightheadedness.  Patient reports that it feels very similar to when she has had hypoglycemic episodes in the past.  Her glucose was 147 when she arrived.  Patient reports associated nausea.  She reports the chest pain started today but yesterday she slept all day with generalized weakness, fatigue, nausea, and just feeling terrible.  She reports no cough no fever.  She has no other complaints at this time.   Patient does not smoke and does not drink.  We reviewed the wishes of her ACP documents together.  She would like to be full code, but would not want to be kept on long-term life support.  ED Temp 98.4, heart rate 84-106, respiratory rate 18-22, blood pressure 117/59-156/83, satting 86-93% on 2 L nasal cannula Borderline leukopenia at 4.9, hemoglobin 13.4 Chemistry is  unremarkable Mag 1.9 CTA shows no PE but does show some nodules that are infectious versus inflammatory Troponin 18, 38 Chest x-ray shows mildly streaky opacification in the lingula EKG shows heart rate 105, sinus tach, QTc 473 Patient was given a 500 mL bolus, Zofran, aspirin Cardiology was consulted recommended admission to [facility] and for cards to see her in the a.m.   * Acute respiratory failure with hypoxia (HCC) -ALT - No oxygen requirement at baseline at this time - No cough, no fever - Borderline leukopenia with a white blood cell count of 4.9 - Procalcitonin < 0.01   - CTA shows nodules that are infectious versus inflammatory -these are presumed to be infectious at this time but could also be scarring from her radiation - No PE - Chest x-ray shows mildly streaky opacification within the lingula  - Continue Levaquin as patient has a cephalosporin allergy  - Weaned  off O2 as tolerated -  Anxiety - Continue Paxil  GERD (gastroesophageal reflux disease) - Continue PPI  Essential hypertension - Continue hydralazine and lisinopril  Chest pain - Symptoms, - Troponin initially 18, then 38>>> 70 1653 -Per cardiology likely ischemic demand - EKG shows sinus tachycardia with a heart rate of 105, QTc 473 - Prescient cardiology evaluation, recommending an invasive normal echocardiogram patient can be discharged home with a follow-up as an outpatient for stress test   -2D echocardiogram FINDINGS   Left Ventricle: The left ventricle has normal systolic function, with an  ejection fraction  of 60-65%. The cavity size was normal. There is  borderline increase in left ventricular wall thickness. Left ventricular  diastolic Doppler parameters are  consistent with impaired relaxation. Evidence of focal basilar septal  hypertrophy     History of left breast cancer - Post radiation and mastectomy - Reports scarring on lungs and had required oxygen after the  radiation and completed in the past      Consultants: Cardiology Procedures performed: 2D echocardiogram Disposition: Home Diet recommendation:  Discharge Diet Orders (From admission, onward)     Start     Ordered   10/27/22 0000  Diet - low sodium heart healthy        10/27/22 1227           Cardiac diet DISCHARGE MEDICATION: Allergies as of 10/27/2022       Reactions   Atorvastatin Other (See Comments)   Myalgia   Vancomycin Hives   Ancef [cefazolin] Rash   Red all over upper body   Lactose Diarrhea   Nasonex [mometasone Furoate] Nausea And Vomiting, Other (See Comments)   headache   Neurontin [gabapentin] Other (See Comments)   depression        Medication List     TAKE these medications    acetaminophen 500 MG tablet Commonly known as: TYLENOL Take 1,000 mg by mouth 3 (three) times daily as needed for headache (pain).   albuterol 108 (90 Base) MCG/ACT inhaler Commonly known as: VENTOLIN HFA Inhale 1-2 puffs into the lungs every 6 (six) hours as needed.   bumetanide 1 MG tablet Commonly known as: BUMEX Take 1 mg by mouth daily.   carvedilol 25 MG tablet Commonly known as: COREG Take 1 tablet (25 mg total) by mouth 2 (two) times daily with a meal.   cetirizine 10 MG tablet Commonly known as: ZYRTEC Take 10 mg by mouth daily.   clopidogrel 75 MG tablet Commonly known as: PLAVIX Take 1 tablet (75 mg total) by mouth every evening. What changed: when to take this   exemestane 25 MG tablet Commonly known as: AROMASIN Take 25 mg by mouth at bedtime.   hydrALAZINE 100 MG tablet Commonly known as: APRESOLINE Take 1 tablet by mouth 3 (three) times daily.   lansoprazole 15 MG capsule Commonly known as: PREVACID Take 1 capsule (15 mg total) by mouth 2 (two) times daily before a meal. What changed: when to take this   lisinopril 40 MG tablet Commonly known as: ZESTRIL Take 40 mg by mouth daily.   PARoxetine 20 MG tablet Commonly known as:  PAXIL Take 1 tablet (20 mg total) by mouth at bedtime.   traMADol 50 MG tablet Commonly known as: ULTRAM TAKE 1 TABLET BY MOUTH EVERY 6 HOURS AS NEEDED FOR MILD PAIN What changed: See the new instructions.   Viberzi 75 MG Tabs Generic drug: Eluxadoline Take 1 tablet (75 mg total) by mouth in the morning and at bedtime. With food        Discharge Exam: Filed Weights   10/26/22 1417 10/26/22 2056  Weight: 102.1 kg 100.8 kg        General:  AAO x 3,  cooperative, no distress;   HEENT:  Normocephalic, PERRL, otherwise with in Normal limits   Neuro:  CNII-XII intact. , normal motor and sensation, reflexes intact   Lungs:   Clear to auscultation BL, Respirations unlabored,  No wheezes / crackles  Cardio:    S1/S2, RRR, No murmure, No Rubs or Gallops   Abdomen:  Soft, non-tender, bowel sounds active all four quadrants, no guarding or peritoneal signs.  Muscular  skeletal:  Limited exam -global generalized weaknesses - in bed, able to move all 4 extremities,   2+ pulses,  symmetric, No pitting edema  Skin:  Dry, warm to touch, negative for any Rashes,  Wounds: Please see nursing documentation          Condition at discharge: good  The results of significant diagnostics from this hospitalization (including imaging, microbiology, ancillary and laboratory) are listed below for reference.   Imaging Studies: CT Angio Chest PE W and/or Wo Contrast  Result Date: 10/26/2022 CLINICAL DATA:  Pulmonary embolism (PE) suspected, high prob EXAM: CT ANGIOGRAPHY CHEST WITH CONTRAST TECHNIQUE: Multidetector CT imaging of the chest was performed using the standard protocol during bolus administration of intravenous contrast. Multiplanar CT image reconstructions and MIPs were obtained to evaluate the vascular anatomy. RADIATION DOSE REDUCTION: This exam was performed according to the departmental dose-optimization program which includes automated exposure control, adjustment of the mA and/or  kV according to patient size and/or use of iterative reconstruction technique. CONTRAST:  75mL OMNIPAQUE IOHEXOL 350 MG/ML SOLN COMPARISON:  11/24/2021, 08/05/2021 FINDINGS: Cardiovascular: Satisfactory opacification of the pulmonary arteries to the segmental level. No evidence of pulmonary embolism. Thoracic aorta is nonaneurysmal. Scattered atherosclerotic vascular calcifications of the aorta and coronary arteries. Normal heart size. No pericardial effusion. Mediastinum/Nodes: No enlarged mediastinal, hilar, or axillary lymph nodes. Thyroid gland, trachea, and esophagus demonstrate no significant findings. Lungs/Pleura: Stable areas of clustered nodules within the posterior aspect of the right upper lobe (series 6, image 37) and within the peripheral aspect of the left upper lobe (series 6, image 38). Probable radiation changes within the anterior aspect of the left upper lobe. No new or enlarging pulmonary nodule. No focal airspace consolidation, pleural effusion, or pneumothorax. 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 No acute bony abnormality or suspicious bone lesion. Review of the MIP images confirms the above findings. IMPRESSION: 1. No evidence of pulmonary embolism or other acute intrathoracic findings. 2. Stable areas of clustered nodules within the upper lobes bilaterally, likely infectious/inflammatory in etiology. No new or enlarging pulmonary nodule. 3. 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 In the absence of uncontrolled hypertension, recommend imaging surveillance Q 1-2 years. In setting of uncontrolled hypertension, aneurysms > 1 cm should be referred for Interventional Radiology consultation. 4. Aortic and coronary artery atherosclerosis (ICD10-I70.0). Electronically Signed   By: Duanne Guess D.O.   On:  10/26/2022 18:24   DG Chest 2 View  Result Date: 10/26/2022 CLINICAL DATA:  Chest pain EXAM: CHEST - 2 VIEW COMPARISON:  09/30/2022 FINDINGS: The heart size and mediastinal contours are within normal limits. Mild streaky opacity within the lingula. No pleural effusion or pneumothorax. Multilevel thoracic spondylosis. IMPRESSION: Mild streaky opacity within the lingula, atelectasis versus infiltrate. Electronically Signed   By: Duanne Guess D.O.   On: 10/26/2022 16:28   Sleep Study Documents  Result Date: 10/01/2022 Ordered by an unspecified provider.  DG Chest 2 View  Result Date: 09/30/2022 CLINICAL DATA:  Shortness of breath EXAM: CHEST - 2 VIEW COMPARISON:  03/23/2022 FINDINGS: The heart size and mediastinal contours are within normal limits. No focal airspace consolidation, pleural effusion, or pneumothorax. The visualized skeletal structures are unremarkable. IMPRESSION: No active cardiopulmonary disease. Electronically Signed   By: Janyth Pupa  Plundo D.O.   On: 09/30/2022 12:25    Microbiology: Results for orders placed or performed during the hospital encounter of 10/26/22  Culture, blood (routine x 2) Call MD if unable to obtain prior to antibiotics being given     Status: None (Preliminary result)   Collection Time: 10/26/22  9:41 PM   Specimen: BLOOD  Result Value Ref Range Status   Specimen Description BLOOD BLOOD RIGHT HAND  Final   Special Requests   Final    BOTTLES DRAWN AEROBIC AND ANAEROBIC Blood Culture results may not be optimal due to an excessive volume of blood received in culture bottles   Culture   Final    NO GROWTH < 12 HOURS Performed at [facility], [address], [place], [place] [address]    Report Status PENDING  Incomplete  Culture, blood (routine x 2) Call MD if unable to obtain prior to antibiotics being given     Status: None (Preliminary result)   Collection Time: 10/26/22  9:41 PM   Specimen: BLOOD  Result Value Ref Range Status   Specimen  Description BLOOD BLOOD RIGHT HAND  Final   Special Requests   Final    BOTTLES DRAWN AEROBIC AND ANAEROBIC Blood Culture adequate volume   Culture   Final    NO GROWTH < 12 HOURS Performed at [facility], [address], [place], [place] [address]    Report Status PENDING  Incomplete    Labs: CBC: Recent Labs  Lab 10/26/22 1428 10/27/22 0201  WBC 4.9 4.2  NEUTROABS  --  1.9  HGB 13.4 13.0  HCT 40.8 39.6  MCV 97.4 98.3  PLT 212 186   Basic Metabolic Panel: Recent Labs  Lab 10/26/22 1428 10/26/22 1630 10/27/22 0201  NA 136  --  138  K 3.5  --  3.6  CL 99  --  100  CO2 30  --  30  GLUCOSE 147*  --  135*  BUN 18  --  15  CREATININE 0.79  --  0.72  CALCIUM 8.9  --  8.5*  MG  --  1.9 1.8   Liver Function Tests: Recent Labs  Lab 10/27/22 0201  AST 24  ALT 24  ALKPHOS 73  BILITOT 0.4  PROT 5.9*  ALBUMIN 3.3*   CBG: No results for input(s): "GLUCAP" in the last 168 hours.  Discharge time spent: greater than 35 minutes.  Signed: Kendell Bane, MD [facility] 10/27/2022

## 2022-10-27 NOTE — Progress Notes (Signed)
   10/27/22 1044  TOC Brief Assessment  Insurance and Status Reviewed  Patient has primary care physician Yes  Home environment has been reviewed from home  Prior level of function: independent  Prior/Current Home Services No current home services  Social Determinants of Health Reivew SDOH reviewed no interventions necessary  Readmission risk has been reviewed Yes  Transition of care needs no transition of care needs at this time    Transition of Care Department [facility]) has reviewed patient and no TOC needs have been identified at this time. We will continue to monitor patient advancement through interdisciplinary progression rounds. If new patient transition needs arise, please place a TOC consult.

## 2022-10-31 LAB — CULTURE, BLOOD (ROUTINE X 2)
Culture: NO GROWTH
Culture: NO GROWTH
Special Requests: ADEQUATE

## 2022-11-01 DIAGNOSIS — G4733 Obstructive sleep apnea (adult) (pediatric): Secondary | ICD-10-CM

## 2022-11-01 DIAGNOSIS — J449 Chronic obstructive pulmonary disease, unspecified: Secondary | ICD-10-CM

## 2022-11-02 NOTE — Telephone Encounter (Signed)
Can we check on this order please.   Also patient recently admitted can we get her in the office in next 1-2 weeks for a post hospital follow up .

## 2022-11-12 ENCOUNTER — Telehealth: Payer: Self-pay

## 2022-11-12 MED ORDER — VIBERZI 75 MG PO TABS: 1.0000 | ORAL_TABLET | Freq: Two times a day (BID) | ORAL | 5 refills | Status: DC

## 2022-11-12 NOTE — Telephone Encounter (Signed)
I spoke with Breanna [name] with [facility] today and they still needed the Bipap Titration Study and and ABG results. I have faxed the sleep study to them but they still need an ABG to be done

## 2022-11-12 NOTE — Telephone Encounter (Signed)
Pt is needing a refill on her viberzi

## 2022-11-12 NOTE — Telephone Encounter (Signed)
We do not need a ABG for OSA to get BIPAP . She had a Titration study for severe OSA that said she needed BIPAP to control her OSA. Synetta Fail please call them back . I am glad to speak with them. She is not getting this for hypercarbia . She has OSA .  Sending to Dr. Vassie Loll  for [facility] as he read the study .   Tried to call patient . Went to [employer]. We have placed order 10/02/22  , first DME did not have according to notes , then patient requested order sent to [facility]. The delay is from insurance and DME .    home sleep study in February that showed severe sleep apnea with AHI of 47.5 and SpO2 low at 64%   09/2022 Titration study : An optimal PAP pressure was selected for this patient ( 23 / 19 cm of water).  auto bipap for better tolerance, can use EPAP min 10, PS +4cm, IPAP max 23 cm

## 2022-11-12 NOTE — Addendum Note (Signed)
Addended by: Gelene Mink on: 11/12/2022 04:26 PM   Modules accepted: Orders

## 2022-11-13 ENCOUNTER — Other Ambulatory Visit: Payer: Self-pay | Admitting: *Deleted

## 2022-11-13 DIAGNOSIS — G4733 Obstructive sleep apnea (adult) (pediatric): Secondary | ICD-10-CM

## 2022-11-17 NOTE — Telephone Encounter (Signed)
Did we find out if DME is going to approve now

## 2022-11-18 NOTE — Telephone Encounter (Signed)
I spoke with Breanna [name] with [facility] on 11/17/22 she stated they have everything thing they need to get the patient the Bipap

## 2022-11-25 ENCOUNTER — Encounter: Payer: Self-pay | Admitting: Adult Health

## 2022-12-03 ENCOUNTER — Other Ambulatory Visit: Payer: Self-pay

## 2022-12-03 DIAGNOSIS — R079 Chest pain, unspecified: Secondary | ICD-10-CM

## 2022-12-14 ENCOUNTER — Telehealth: Payer: Self-pay | Admitting: Internal Medicine

## 2022-12-14 NOTE — Telephone Encounter (Signed)
[facility] calling with the status of a long ago submitted SX clearance. Pls call Debbie to advise.  Faxed June 28th  Her # is [phone]  I asked her refax it to [facility] machine.

## 2022-12-15 NOTE — Telephone Encounter (Signed)
I still do not have anything on this pt regarding surg clearance  Called the number provided and left detailed msg to fax to the machine in my office  Will await request

## 2022-12-18 ENCOUNTER — Ambulatory Visit: Payer: Medicare Other | Admitting: Adult Health

## 2022-12-21 ENCOUNTER — Encounter (HOSPITAL_COMMUNITY): Payer: Medicare Other

## 2022-12-23 ENCOUNTER — Inpatient Hospital Stay (HOSPITAL_COMMUNITY): Admission: RE | Admit: 2022-12-23 | Payer: Medicare Other | Source: Ambulatory Visit

## 2022-12-23 ENCOUNTER — Encounter (HOSPITAL_COMMUNITY): Payer: Medicare Other

## 2022-12-29 ENCOUNTER — Ambulatory Visit: Payer: Medicare Other | Admitting: Physician Assistant

## 2023-01-07 NOTE — Telephone Encounter (Signed)
Still have not received any surgery clearance request on this pt  I called the number provided for surgery scheduler and left detailed msg letting her know that if needed she can fax form to me [phone]   Closing this encounter

## 2023-01-11 ENCOUNTER — Telehealth: Payer: Self-pay | Admitting: Adult Health

## 2023-01-11 NOTE — Telephone Encounter (Signed)
Breanna [name] is scheduled in [employer] for 11/15. Spoke to patient. She is not willing to travel to [facility] for appt.  Breanna [name], please advise if virtual is okay?

## 2023-01-11 NOTE — Telephone Encounter (Signed)
I can see her after the PFT , just double book her at 1030 or 11. Block the 430 slot please .

## 2023-01-11 NOTE — Telephone Encounter (Signed)
Virtual is fine

## 2023-01-11 NOTE — Telephone Encounter (Signed)
Fax received from Dr. Milly Jakob with [facility] to perform a total right knee arthroplasty on patient.  Patient needs surgery clearance. Surgery is TBD. Patient was seen on 09/30/22. Office protocol is a risk assessment can be sent to surgeon if patient has been seen in 60 days or less.   Tammy- this pt is scheduled for PFT and virtual visit with you on Friday 01/22/23- is it okay to keep this visit virtual being that she needs clearance?  Otherwise may need to reschedule and there is not anything open until Jan 2025. Thanks!

## 2023-01-11 NOTE — Telephone Encounter (Signed)
Noted. Pt will keep scheduled visit.

## 2023-01-18 ENCOUNTER — Encounter (HOSPITAL_COMMUNITY): Payer: Medicare Other

## 2023-01-22 ENCOUNTER — Ambulatory Visit (HOSPITAL_BASED_OUTPATIENT_CLINIC_OR_DEPARTMENT_OTHER): Payer: Medicare Other | Admitting: Internal Medicine

## 2023-01-22 ENCOUNTER — Telehealth: Payer: Medicare Other | Admitting: Adult Health

## 2023-01-22 ENCOUNTER — Encounter: Payer: Self-pay | Admitting: Adult Health

## 2023-01-22 DIAGNOSIS — G4733 Obstructive sleep apnea (adult) (pediatric): Secondary | ICD-10-CM

## 2023-01-22 DIAGNOSIS — J4520 Mild intermittent asthma, uncomplicated: Secondary | ICD-10-CM | POA: Diagnosis not present

## 2023-01-22 DIAGNOSIS — J449 Chronic obstructive pulmonary disease, unspecified: Secondary | ICD-10-CM

## 2023-01-22 DIAGNOSIS — R0600 Dyspnea, unspecified: Secondary | ICD-10-CM

## 2023-01-22 LAB — PULMONARY FUNCTION TEST
DL/VA % pred: 131 %
DL/VA: 5.37 ml/min/mmHg/L
DLCO cor % pred: 106 %
DLCO cor: 20.34 ml/min/mmHg
DLCO unc % pred: 106 %
DLCO unc: 20.34 ml/min/mmHg
FEF 25-75 Post: 1.71 L/s
FEF 25-75 Pre: 1.13 L/s
FEF2575-%Change-Post: 50 %
FEF2575-%Pred-Post: 105 %
FEF2575-%Pred-Pre: 70 %
FEV1-%Change-Post: 8 %
FEV1-%Pred-Post: 75 %
FEV1-%Pred-Pre: 69 %
FEV1-Post: 1.57 L
FEV1-Pre: 1.45 L
FEV1FVC-%Change-Post: -1 %
FEV1FVC-%Pred-Pre: 102 %
FEV6-%Change-Post: 10 %
FEV6-%Pred-Post: 78 %
FEV6-%Pred-Pre: 71 %
FEV6-Post: 2.08 L
FEV6-Pre: 1.87 L
FEV6FVC-%Change-Post: 0 %
FEV6FVC-%Pred-Post: 104 %
FEV6FVC-%Pred-Pre: 104 %
FVC-%Change-Post: 10 %
FVC-%Pred-Post: 75 %
FVC-%Pred-Pre: 68 %
FVC-Post: 2.09 L
FVC-Pre: 1.89 L
Post FEV1/FVC ratio: 75 %
Post FEV6/FVC ratio: 99 %
Pre FEV1/FVC ratio: 76 %
Pre FEV6/FVC Ratio: 99 %
RV % pred: 92 %
RV: 2.13 L
TLC % pred: 84 %
TLC: 4.29 L

## 2023-01-22 NOTE — Patient Instructions (Signed)
Full PFT Performed Today  

## 2023-01-22 NOTE — Patient Instructions (Addendum)
Decrease BIPAP pressure IPAP max 20 and EPAP min 12 cm Continue on BIPAP At bedtime . Do not drive if sleepy   Albuterol inhaler 1-2 puffs every 6hr as needed .   Follow up in 2 months with Dr. Vassie Loll for OSA [facility] office) .

## 2023-01-22 NOTE — Progress Notes (Signed)
Full PFT Performed Today  

## 2023-01-22 NOTE — Progress Notes (Unsigned)
Virtual Visit via Video Note  I connected with Breanna [name] on 01/22/23 at  4:00 PM EST by a video enabled telemedicine application and verified that I am speaking with the correct person using two identifiers.  Location: Patient: Home  Provider: Office    I discussed the limitations of evaluation and management by telemedicine and the availability of in person appointments. The patient expressed understanding and agreed to proceed.  History of Present Illness: 77 yo female seen for pulmonary and sleep consult 12/2021 for dyspnea and sleepiness  Sleep study revealed severe OSA     Knee replacement planned for early January.  Has stress test planned next week.  Previous surgery in past with no known trouble with anesthesia.   Pressure is too high Full face mask.  Has tried several different masks.   PFT  Mild intermittent asthma pattern  PFT is stable  Get winded with incline .   Interested in [place]  See Vassie Loll   Breathing is ok   Surg [facility]   Observations/Objective:   Assessment and Plan:   Follow Up Instructions:    I discussed the assessment and treatment plan with the patient. The patient was provided an opportunity to ask questions and all were answered. The patient agreed with the plan and demonstrated an understanding of the instructions.   The patient was advised to call back or seek an in-person evaluation if the symptoms worsen or if the condition fails to improve as anticipated.  I provided *** minutes of non-face-to-face time during this encounter.   Rubye Oaks, NP

## 2023-01-26 ENCOUNTER — Ambulatory Visit: Payer: Medicare Other | Admitting: Adult Health

## 2023-01-26 ENCOUNTER — Encounter (HOSPITAL_BASED_OUTPATIENT_CLINIC_OR_DEPARTMENT_OTHER): Payer: Medicare Other

## 2023-01-26 NOTE — Telephone Encounter (Signed)
Tammy, please let me know once risk assessment is added to her note, thanks!

## 2023-01-28 ENCOUNTER — Encounter: Payer: Self-pay | Admitting: Gastroenterology

## 2023-01-28 ENCOUNTER — Ambulatory Visit: Payer: Medicare Other | Admitting: Gastroenterology

## 2023-01-28 VITALS — BP 138/71 | HR 75 | Temp 97.6°F | Ht 64.0 in | Wt 221.4 lb

## 2023-01-28 DIAGNOSIS — K219 Gastro-esophageal reflux disease without esophagitis: Secondary | ICD-10-CM | POA: Diagnosis not present

## 2023-01-28 DIAGNOSIS — K580 Irritable bowel syndrome with diarrhea: Secondary | ICD-10-CM | POA: Diagnosis not present

## 2023-01-28 DIAGNOSIS — R140 Abdominal distension (gaseous): Secondary | ICD-10-CM | POA: Diagnosis not present

## 2023-01-28 NOTE — Patient Instructions (Signed)
Please fax [place] your income information again so we can submit for your patient assistance for Viberzi.  For now continue to take Imodium 2 tablets as needed for your diarrhea as well as the Lactaid tablets  Avoid gas-producing foods (eg, cabbage, legumes, onions, broccoli, brussel sprouts, wheat, and potatoes).   I will provide you with the C 13 breath test today to assess for sucrase deficiency.  Our office will fill out the necessary forms for you to also do the [facility] smart breath test at home to see if we can find a cause of the bloating and gassiness.  Follow up in 6 months, sooner if needed.   It was a pleasure to see you today. I want to create trusting relationships with patients. If you receive a survey regarding your visit,  I greatly appreciate you taking time to fill this out on paper or through your MyChart. I value your feedback.  Brooke Bonito, MSN, FNP-BC, AGACNP-BC [facility]
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
## 2023-01-29 ENCOUNTER — Encounter (HOSPITAL_COMMUNITY): Payer: Medicare Other

## 2023-01-29 NOTE — Telephone Encounter (Signed)
I have faxed copy of risk assessment from 01/22/23 to [facility].

## 2023-02-12 ENCOUNTER — Ambulatory Visit: Payer: Medicare Other | Admitting: Student

## 2023-02-19 ENCOUNTER — Telehealth: Payer: Self-pay | Admitting: Gastroenterology

## 2023-02-19 ENCOUNTER — Other Ambulatory Visit: Payer: Self-pay

## 2023-02-19 MED ORDER — VIBERZI 75 MG PO TABS: 1.0000 | ORAL_TABLET | Freq: Two times a day (BID) | ORAL | 5 refills | Status: DC

## 2023-02-19 NOTE — Telephone Encounter (Signed)
Low sucrase activity on breath test. She may benefit from a trial of sucraid if she would like to try it.   I encourage her to look at the handout provided with her breath test and see if she would like to try sucraid.  Its is a liquid that she would take with meals and is supposed to help with the bloating that could be from her body's inability to break down sugars.   Important Safety Information for Sucraid (sacrosidase) Oral Solution Tell your doctor if you are allergic to, have ever had a reaction to, or have ever had difficulty taking yeast, yeast products, papain, or glycerin (glycerol). Sucraid may cause a serious allergic reaction. If you notice any swelling or have difficulty breathing, get emergency help right away. Sucraid does not break down some sugars that come from the digestion of starch. You may need to restrict the amount of starch in your diet. Your doctor will tell you if you should restrict starch in your diet. Tell your doctor if you have diabetes, as your blood glucose levels may change if you begin taking Sucraid. Your doctor will tell you if your diet or diabetes medicines need to be changed. Some patients treated with Sucraid may have worse abdominal pain, vomiting, nausea, or diarrhea. Constipation, difficulty sleeping, headache, nervousness, and dehydration have also occurred in patients treated with Sucraid. Check with your doctor if you notice these or other side effects. Sucraid has not been tested to see if it works in patients with secondary (acquired) sucrase deficiency. NEVER HEAT SUCRAID OR PUT IT IN WARM OR HOT BEVERAGES OR INFANT FORMULA. Do not mix Sucraid with fruit juice or take it with fruit juice. Take Sucraid as prescribed by your doctor. Normally, half of the dose of Sucraid is taken just before a meal or snack and the other half is taken during the meal or snack. Sucraid should be refrigerated at 54F-43F [facility]) and should be protected from  heat and light; single-use containers can be removed from refrigeration and stored at 38F-72F (15C-25C) for up to 3 days (72 hours). Refer to Instructions for Use for full information on how to take Sucraid.  Brooke Bonito, MSN, APRN, FNP-BC, AGACNP-BC [facility]

## 2023-02-22 ENCOUNTER — Encounter: Payer: Self-pay | Admitting: Podiatry

## 2023-02-22 ENCOUNTER — Ambulatory Visit (INDEPENDENT_AMBULATORY_CARE_PROVIDER_SITE_OTHER): Payer: Medicare Other | Admitting: Podiatry

## 2023-02-22 DIAGNOSIS — L600 Ingrowing nail: Secondary | ICD-10-CM | POA: Diagnosis not present

## 2023-02-22 DIAGNOSIS — L84 Corns and callosities: Secondary | ICD-10-CM

## 2023-02-22 NOTE — Progress Notes (Signed)
Subjective:  Patient ID: Breanna [name], female    DOB: 03-Mar-1946,  MRN: [id number]  Breanna [name] presents to clinic today for:  Chief Complaint  Patient presents with   Foot Pain    She has noticed a callus on left big toe that she will sand down and it will come back quickly. She thinks she may have an ingrown nail on both big toes.    Patient presents with concern of an ingrown toenail on the left hallux medial nail border.  She states the lateral nail border was removed in the past by one of our physicians and she feels that the medial border started curling inward much more after the procedure.  She is very anxious about having a procedure performed stating that the last time that was done she was in significant discomfort during the procedure.  She also notes a roughened/painful area of hard skin on the right medial hallux  PCP is Soledad Gerlach, [place].  Allergies  Allergen Reactions   Atorvastatin Other (See Comments)    Myalgia    Vancomycin Hives   Ancef [Cefazolin] Rash    Red all over upper body   Lactose Diarrhea   Nasonex [Mometasone Furoate] Nausea And Vomiting and Other (See Comments)    headache   Neurontin [Gabapentin] Other (See Comments)    depression    Review of Systems: Negative except as noted in the HPI.  Objective:  ZHOE TREASE is a pleasant 77 y.o. female in NAD. AAO x 3.  Vascular Examination: Capillary refill time is 3-5 seconds to toes bilateral. Palpable pedal pulses b/l LE. Digital hair present b/l. No pedal edema b/l. Skin temperature gradient WNL b/l. No varicosities b/l. No cyanosis or clubbing noted b/l.   Dermatological Examination: There is incurvation of the left hallux medial nail border.  There is pain on palpation of the affected nail border.  There is an area of irregular hyperkeratotic skin on the medial aspect of the right hallux.  The irregularity is due to the skin crease/wrinkle and in the same area causing small fissures  within the callus.  Assessment/Plan: 1. Ingrown nail   2. Callus of foot    Discussed patient's condition today.  After obtaining patient consent, the left hallux was anesthetized with a 50:50 mixture of 1% lidocaine plain and 0.5% bupivacaine plain for a total of 3cc's administered.  Upon confirmation of anesthesia, a freer elevator was utilized to free the left hallux medial nail border from the nail bed.  The nail border was then avulsed proximal to the eponychium and removed in toto.  The area was inspected for any remaining spicules.  A chemical matrixectomy was performed with NaOH and neutralized with acetic acid solution.  Antibiotic ointment and a DSD were applied, followed by a Coban dressing.  Patient tolerated the anesthetic and procedure well and will f/u in 2-3 weeks for recheck.  Patient given post-procedure instructions for daily 15-minute Epsom salt soaks, antibiotic ointment and daily use of Bandaids until toe starts to dry / form eschar.   The right hallux callus was sanded with an umbrella bur.  Recommended Reliaderm 40 cream applied nightly to the callus to help prevent regrowth.  She was informed this to has a tendency to recur due to its location and the biomechanics of her gait  Return in about 2 weeks (around 03/08/2023) for PNA recheck.   Clerance Lav, DPM, FACFAS [facility]  [address] [place], [place] [address]                Office [phone]  Fax [phone]

## 2023-02-22 NOTE — Telephone Encounter (Signed)
Pt was made aware and would like to try a trial of sucraid.

## 2023-02-22 NOTE — Patient Instructions (Signed)

## 2023-02-22 NOTE — Telephone Encounter (Signed)
Lmom for pt to return call. 

## 2023-02-23 ENCOUNTER — Other Ambulatory Visit: Payer: Self-pay | Admitting: Gastroenterology

## 2023-02-23 NOTE — Telephone Encounter (Signed)
Form has been filled out and faxed to the company.

## 2023-03-15 ENCOUNTER — Encounter: Payer: Medicare Other | Admitting: Podiatry

## 2023-03-15 NOTE — Progress Notes (Signed)
 Patient did not show for her scheduled f/u appointment this afternoon

## 2023-04-06 NOTE — Progress Notes (Signed)
Sent message, via epic in basket, requesting orders in epic from Careers adviser.

## 2023-04-08 NOTE — Progress Notes (Signed)
Please place orders for PAT appointment scheduled 04/13/23.

## 2023-04-08 NOTE — Progress Notes (Addendum)
 COVID Vaccine Completed: yes  Date of COVID positive in last 90 days:  PCP - Duwaine Bloch, FNP Cardiologist - Pulmonologist- Madelin Stank, NP   Clearance by Duwaine Bloch, FNP in media tab 03/01/23  Chest x-ray - 10/26/22 Epic EKG - 10/26/22 Epic tachy at 105 Stress Test - 10/17/18 Epic ECHO - 10/27/22 Epic Cardiac Cath -  Pacemaker/ICD device last checked: Spinal Cord Stimulator:  Bowel Prep -   Sleep Study -  CPAP -   Fasting Blood Sugar -  Checks Blood Sugar _____ times a day  Last dose of GLP1 agonist-  Ozempic GLP1 instructions:  Hold 7 days before surgery    Last dose of SGLT-2 inhibitors-  N/A SGLT-2 instructions:  Hold 3 days before surgery    Blood Thinner Instructions:  Plavix  Aspirin  Instructions: Last Dose:  Activity level:  Can go up a flight of stairs and perform activities of daily living without stopping and without symptoms of chest pain or shortness of breath.  Able to exercise without symptoms  Unable to go up a flight of stairs without symptoms of     Anesthesia review: CP, HTN, COPD, TIA, OSA  Patient denies shortness of breath, fever, cough and chest pain at PAT appointment  Patient verbalized understanding of instructions that were given to them at the PAT appointment. Patient was also instructed that they will need to review over the PAT instructions again at home before surgery.

## 2023-04-08 NOTE — Patient Instructions (Addendum)
SURGICAL WAITING ROOM VISITATION  Patients having surgery or a procedure may have no more than 2 support people in the waiting area - these visitors may rotate.    Children under the age of 102 must have an adult with them who is not the patient.  Due to an increase in RSV and influenza rates and associated hospitalizations, children ages 9 and under may not visit patients in [facility] hospitals.  Visitors with respiratory illnesses are discouraged from visiting and should remain at home.  If the patient needs to stay at the hospital during part of their recovery, the visitor guidelines for inpatient rooms apply. Pre-op nurse will coordinate an appropriate time for 1 support person to accompany patient in pre-op.  This support person may not rotate.    Please refer to the [facility] website for the visitor guidelines for Inpatients (after your surgery is over and you are in a regular room).    Your procedure is scheduled on: 04/26/23   Report to [facility] Main Entrance    Report to admitting at 5:15 AM   Call this number if you have problems the morning of surgery [phone]   Do not eat food or drink liquids :After Midnight.     Oral Hygiene is also important to reduce your risk of infection.                                    Remember - BRUSH YOUR TEETH THE MORNING OF SURGERY WITH YOUR REGULAR TOOTHPASTE  DENTURES WILL BE REMOVED PRIOR TO SURGERY PLEASE DO NOT APPLY "Poly grip" OR ADHESIVES!!!   Stop all vitamins and herbal supplements 7 days before surgery.   Hold Ozempic for 7 days prior to surgery.   Take these medicines the morning of surgery with A SIP OF WATER: Tylenol, Inhalers, Carvedilol, Zyrtec, Hydralazine, Prevacid, Tramadol  Bring CPAP mask and tubing day of surgery.                              You may not have any metal on your body including hair pins, jewelry, and body piercing             Do not wear make-up, lotions, powders, perfumes, or  deodorant  Do not wear nail polish including gel and S&S, artificial/acrylic nails, or any other type of covering on natural nails including finger and toenails. If you have artificial nails, gel coating, etc. that needs to be removed by a nail salon please have this removed prior to surgery or surgery may need to be canceled/ delayed if the surgeon/ anesthesia feels like they are unable to be safely monitored.   Do not shave  48 hours prior to surgery.    Do not bring valuables to the hospital. [facility] IS NOT             RESPONSIBLE   FOR VALUABLES.   Contacts, glasses, dentures or bridgework may not be worn into surgery.  DO NOT BRING YOUR HOME MEDICATIONS TO THE HOSPITAL. PHARMACY WILL DISPENSE MEDICATIONS LISTED ON YOUR MEDICATION LIST TO YOU DURING YOUR ADMISSION IN THE HOSPITAL!    Patients discharged on the day of surgery will not be allowed to drive home.  Someone NEEDS to stay with you for the first 24 hours after anesthesia.   Special Instructions: Bring a copy  of your healthcare power of attorney and living will documents the day of surgery if you haven't scanned them before.              Please read over the following fact sheets you were given: IF YOU HAVE QUESTIONS ABOUT YOUR PRE-OP INSTRUCTIONS PLEASE CALL [phone] [place]   If you received a COVID test during your pre-op visit  it is requested that you wear a mask when out in public, stay away from anyone that may not be feeling well and notify your surgeon if you develop symptoms. If you test positive for Covid or have been in contact with anyone that has tested positive in the last 10 days please notify you surgeon.      Pre-operative 5 CHG Bath Instructions   You can play a key role in reducing the risk of infection after surgery. Your skin needs to be as free of germs as possible. You can reduce the number of germs on your skin by washing with CHG (chlorhexidine gluconate) soap before surgery. CHG is an  antiseptic soap that kills germs and continues to kill germs even after washing.   DO NOT use if you have an allergy to chlorhexidine/CHG or antibacterial soaps. If your skin becomes reddened or irritated, stop using the CHG and notify one of our RNs at [phone].   Please shower with the CHG soap starting 4 days before surgery using the following schedule:     Please keep in mind the following:  DO NOT shave, including legs and underarms, starting the day of your first shower.   You may shave your face at any point before/day of surgery.  Place clean sheets on your bed the day you start using CHG soap. Use a clean washcloth (not used since being washed) for each shower. DO NOT sleep with pets once you start using the CHG.   CHG Shower Instructions:  If you choose to wash your hair and private area, wash first with your normal shampoo/soap.  After you use shampoo/soap, rinse your hair and body thoroughly to remove shampoo/soap residue.  Turn the water OFF and apply about 3 tablespoons (45 ml) of CHG soap to a CLEAN washcloth.  Apply CHG soap ONLY FROM YOUR NECK DOWN TO YOUR TOES (washing for 3-5 minutes)  DO NOT use CHG soap on face, private areas, open wounds, or sores.  Pay special attention to the area where your surgery is being performed.  If you are having back surgery, having someone wash your back for you may be helpful. Wait 2 minutes after CHG soap is applied, then you may rinse off the CHG soap.  Pat dry with a clean towel  Put on clean clothes/pajamas   If you choose to wear lotion, please use ONLY the CHG-compatible lotions on the back of this paper.     Additional instructions for the day of surgery: DO NOT APPLY any lotions, deodorants, cologne, or perfumes.   Put on clean/comfortable clothes.  Brush your teeth.  Ask your nurse before applying any prescription medications to the skin.      CHG Compatible Lotions   Aveeno Moisturizing lotion  Cetaphil  Moisturizing Cream  Cetaphil Moisturizing Lotion  Clairol Herbal Essence Moisturizing Lotion, Dry Skin  Clairol Herbal Essence Moisturizing Lotion, Extra Dry Skin  Clairol Herbal Essence Moisturizing Lotion, Normal Skin  Curel Age Defying Therapeutic Moisturizing Lotion with Alpha Hydroxy  Curel Extreme Care Body Lotion  Curel Soothing Hands Moisturizing Hand Lotion  Curel Therapeutic Moisturizing Cream, Fragrance-Free  Curel Therapeutic Moisturizing Lotion, Fragrance-Free  Curel Therapeutic Moisturizing Lotion, Original Formula  Eucerin Daily Replenishing Lotion  Eucerin Dry Skin Therapy Plus Alpha Hydroxy Crme  Eucerin Dry Skin Therapy Plus Alpha Hydroxy Lotion  Eucerin Original Crme  Eucerin Original Lotion  Eucerin Plus Crme Eucerin Plus Lotion  Eucerin TriLipid Replenishing Lotion  Keri Anti-Bacterial Hand Lotion  Keri Deep Conditioning Original Lotion Dry Skin Formula Softly Scented  Keri Deep Conditioning Original Lotion, Fragrance Free Sensitive Skin Formula  Keri Lotion Fast Absorbing Fragrance Free Sensitive Skin Formula  Keri Lotion Fast Absorbing Softly Scented Dry Skin Formula  Keri Original Lotion  Keri Skin Renewal Lotion Keri Silky Smooth Lotion  Keri Silky Smooth Sensitive Skin Lotion  Nivea Body Creamy Conditioning Oil  Nivea Body Extra Enriched Teacher, adult education Moisturizing Lotion Nivea Crme  Nivea Skin Firming Lotion  NutraDerm 30 Skin Lotion  NutraDerm Skin Lotion  NutraDerm Therapeutic Skin Cream  NutraDerm Therapeutic Skin Lotion  ProShield Protective Hand Cream  Provon moisturizing lotion

## 2023-04-13 ENCOUNTER — Encounter (HOSPITAL_COMMUNITY)
Admission: RE | Admit: 2023-04-13 | Discharge: 2023-04-13 | Disposition: A | Discharge status: Home / Self Care | Payer: Medicare Other | Source: Ambulatory Visit | Attending: Orthopedic Surgery | Admitting: Orthopedic Surgery

## 2023-04-13 ENCOUNTER — Other Ambulatory Visit: Payer: Self-pay

## 2023-04-13 ENCOUNTER — Encounter (HOSPITAL_COMMUNITY): Payer: Self-pay

## 2023-04-13 VITALS — BP 140/68 | HR 71 | Temp 98.1°F | Resp 18 | Ht 64.0 in | Wt 214.0 lb

## 2023-04-13 DIAGNOSIS — G4733 Obstructive sleep apnea (adult) (pediatric): Secondary | ICD-10-CM | POA: Insufficient documentation

## 2023-04-13 DIAGNOSIS — Z8673 Personal history of transient ischemic attack (TIA), and cerebral infarction without residual deficits: Secondary | ICD-10-CM | POA: Diagnosis not present

## 2023-04-13 DIAGNOSIS — Z01812 Encounter for preprocedural laboratory examination: Secondary | ICD-10-CM | POA: Diagnosis present

## 2023-04-13 DIAGNOSIS — Z853 Personal history of malignant neoplasm of breast: Secondary | ICD-10-CM | POA: Diagnosis not present

## 2023-04-13 DIAGNOSIS — J45909 Unspecified asthma, uncomplicated: Secondary | ICD-10-CM | POA: Insufficient documentation

## 2023-04-13 DIAGNOSIS — Z01818 Encounter for other preprocedural examination: Secondary | ICD-10-CM

## 2023-04-13 DIAGNOSIS — I10 Essential (primary) hypertension: Secondary | ICD-10-CM | POA: Diagnosis not present

## 2023-04-13 DIAGNOSIS — M1711 Unilateral primary osteoarthritis, right knee: Secondary | ICD-10-CM | POA: Insufficient documentation

## 2023-04-13 LAB — CBC
HCT: 41.8 % (ref 36.0–46.0)
Hemoglobin: 13.2 g/dL (ref 12.0–15.0)
MCH: 31.3 pg (ref 26.0–34.0)
MCHC: 31.6 g/dL (ref 30.0–36.0)
MCV: 99.1 fL (ref 80.0–100.0)
Platelets: 213 10*3/uL (ref 150–400)
RBC: 4.22 MIL/uL (ref 3.87–5.11)
RDW: 13 % (ref 11.5–15.5)
WBC: 6 10*3/uL (ref 4.0–10.5)
nRBC: 0 % (ref 0.0–0.2)

## 2023-04-13 LAB — BASIC METABOLIC PANEL
Anion gap: 8 (ref 5–15)
BUN: 27 mg/dL — ABNORMAL HIGH (ref 8–23)
CO2: 26 mmol/L (ref 22–32)
Calcium: 9.5 mg/dL (ref 8.9–10.3)
Chloride: 105 mmol/L (ref 98–111)
Creatinine, Ser: 0.56 mg/dL (ref 0.44–1.00)
GFR, Estimated: >60 mL/min (ref >60)
Glucose, Bld: 105 mg/dL — ABNORMAL HIGH (ref 70–99)
Potassium: 4.2 mmol/L (ref 3.5–5.1)
Sodium: 139 mmol/L (ref 135–145)

## 2023-04-13 LAB — SURGICAL PCR SCREEN
MRSA, PCR: NEGATIVE
Staphylococcus aureus: NEGATIVE

## 2023-04-13 NOTE — Progress Notes (Addendum)
 COVID Vaccine Completed: yes  Date of COVID positive in last 31 days:no  PCP Wanda Decent NP in [place] Cardiologist -n/a Pulmonologist- Madelin Stank, NP   Clearance by Duwaine Bloch, FNP in media tab 03/01/23  Chest x-ray - 10/26/22 Epic EKG - 10/26/22 Epic tachy at 105 Stress Test - 10/17/18 Epic ECHO - 10/27/22 Epic Cardiac Cath -  Pacemaker/ICD device last checked:n/a Spinal Cord Stimulator:n/a  Bowel Prep - no  Sleep Study - yes CPAP - yes- Bipap  Fasting Blood Sugar - n/a Checks Blood Sugar _____0 times a day  Last dose of GLP1 agonist-  Ozempic GLP1 instructions:  Hold 7 days before surgery Last dose will be 04/18/23   Last dose of SGLT-2 inhibitors-  N/A SGLT-2 instructions:  Hold 3 days before surgery    Blood Thinner Instructions:  Plavix - hold 5 days prior to surgery per MD office Aspirin  Instructions:no Last Dose:  Activity level:  Can go up a flight of stairs and perform activities of daily living without stopping and without symptoms of chest pain or shortness of breath.  UnAble to exercise  due to knee pain     Anesthesia review: HTN, COPD, TIA, OSA, CP 10/26/22  Patient denies shortness of breath, fever, cough and chest pain at PAT appointment  Patient verbalized understanding of instructions that were given to them at the PAT appointment. Patient was also instructed that they will need to review over the PAT instructions again at home before surgery.

## 2023-04-13 NOTE — Progress Notes (Signed)
 COVID Vaccine Completed: yes  Date of COVID positive in last 61 days:no  PCP Wanda Decent NP in [place] Cardiologist - Pulmonologist- Madelin Stank, NP   Clearance by Duwaine Bloch, FNP in media tab 03/01/23  Chest x-ray - 10/26/22 Epic EKG - 10/26/22 Epic tachy at 105 Stress Test - 10/17/18 Epic ECHO - 10/27/22 Epic Cardiac Cath -  Pacemaker/ICD device last checked: Spinal Cord Stimulator:  Bowel Prep - no  Sleep Study - yes CPAP - yes- Bipap  Fasting Blood Sugar - n/a Checks Blood Sugar _____0 times a day  Last dose of GLP1 agonist-  Ozempic GLP1 instructions:  Hold 7 days before surgery Last dose will be 04/18/23   Last dose of SGLT-2 inhibitors-  N/A SGLT-2 instructions:  Hold 3 days before surgery    Blood Thinner Instructions:  Plavix - hold 5 days prior to surgery per MD office Aspirin  Instructions:no Last Dose:  Activity level:  Can go up a flight of stairs and perform activities of daily living without stopping and without symptoms of chest pain or shortness of breath.  UnAble to exercise  due to knee pain     Anesthesia review: CP, HTN, COPD, TIA, OSA  Patient denies shortness of breath, fever, cough and chest pain at PAT appointment  Patient verbalized understanding of instructions that were given to them at the PAT appointment. Patient was also instructed that they will need to review over the PAT instructions again at home before surgery.

## 2023-04-15 ENCOUNTER — Other Ambulatory Visit: Payer: Self-pay | Admitting: Orthopedic Surgery
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
## 2023-04-20 ENCOUNTER — Telehealth: Payer: Self-pay | Admitting: Adult Health

## 2023-04-20 NOTE — Telephone Encounter (Signed)
Breanna [name] states needs benefits and usage of BIPAP machine. Breanna [name] phone number is [phone]. Fax number is [phone].

## 2023-04-23 NOTE — Telephone Encounter (Signed)
LOV note dated 01/22/23 was printed and placed in outgoing fax pile  The note contains benefits and usage regarding BIPAP  Closing encounter
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
## 2023-04-26 ENCOUNTER — Ambulatory Visit (HOSPITAL_COMMUNITY): Payer: Medicare Other | Admitting: Physician Assistant

## 2023-04-26 ENCOUNTER — Encounter (HOSPITAL_COMMUNITY): Payer: Self-pay | Admitting: Orthopedic Surgery

## 2023-04-26 ENCOUNTER — Ambulatory Visit (HOSPITAL_COMMUNITY)
Admission: RE | Admit: 2023-04-26 | Discharge: 2023-04-26 | Disposition: A | Discharge status: Home / Self Care | Payer: Medicare Other | Attending: Orthopedic Surgery | Admitting: Orthopedic Surgery

## 2023-04-26 ENCOUNTER — Encounter (HOSPITAL_COMMUNITY)
Admission: RE | Disposition: A | Discharge status: Home / Self Care | Payer: Self-pay | Source: Home / Self Care | Attending: Orthopedic Surgery

## 2023-04-26 ENCOUNTER — Ambulatory Visit (HOSPITAL_COMMUNITY): Payer: Medicare Other | Admitting: Anesthesiology

## 2023-04-26 DIAGNOSIS — K219 Gastro-esophageal reflux disease without esophagitis: Secondary | ICD-10-CM | POA: Insufficient documentation

## 2023-04-26 DIAGNOSIS — G4733 Obstructive sleep apnea (adult) (pediatric): Secondary | ICD-10-CM | POA: Insufficient documentation

## 2023-04-26 DIAGNOSIS — I10 Essential (primary) hypertension: Secondary | ICD-10-CM | POA: Diagnosis not present

## 2023-04-26 DIAGNOSIS — Z79899 Other long term (current) drug therapy: Secondary | ICD-10-CM | POA: Diagnosis not present

## 2023-04-26 DIAGNOSIS — K449 Diaphragmatic hernia without obstruction or gangrene: Secondary | ICD-10-CM | POA: Diagnosis not present

## 2023-04-26 DIAGNOSIS — Z8673 Personal history of transient ischemic attack (TIA), and cerebral infarction without residual deficits: Secondary | ICD-10-CM | POA: Insufficient documentation

## 2023-04-26 DIAGNOSIS — Z87891 Personal history of nicotine dependence: Secondary | ICD-10-CM | POA: Insufficient documentation

## 2023-04-26 DIAGNOSIS — M1712 Unilateral primary osteoarthritis, left knee: Secondary | ICD-10-CM | POA: Diagnosis present

## 2023-04-26 DIAGNOSIS — M1711 Unilateral primary osteoarthritis, right knee: Secondary | ICD-10-CM

## 2023-04-26 DIAGNOSIS — G709 Myoneural disorder, unspecified: Secondary | ICD-10-CM | POA: Diagnosis not present

## 2023-04-26 HISTORY — PX: TOTAL KNEE ARTHROPLASTY: SHX125

## 2023-04-26 SURGERY — ARTHROPLASTY, KNEE, TOTAL
Anesthesia: Spinal | Site: Knee | Laterality: Right

## 2023-04-26 MED ORDER — OXYCODONE-ACETAMINOPHEN 5-325 MG PO TABS: 1.0000 | ORAL_TABLET | ORAL | 0 refills | Status: DC | PRN

## 2023-04-26 MED ORDER — BUPIVACAINE LIPOSOME 1.3 % IJ SUSP
20.0000 mL | Freq: Once | INTRAMUSCULAR | Status: DC
Start: 2023-04-26 — End: 2023-04-26

## 2023-04-26 MED ORDER — BUPIVACAINE-EPINEPHRINE 0.25% -1:200000 IJ SOLN
INTRAMUSCULAR | Status: AC
  Filled 2023-04-26: qty 1

## 2023-04-26 MED ORDER — ACETAMINOPHEN 160 MG/5ML PO SOLN: 325.0000 mg | Freq: Once | ORAL | Status: DC | PRN

## 2023-04-26 MED ORDER — EPHEDRINE SULFATE-NACL 50-0.9 MG/10ML-% IV SOSY
PREFILLED_SYRINGE | INTRAVENOUS | Status: DC | PRN
  Administered 2023-04-26: 7.5 mg via INTRAVENOUS

## 2023-04-26 MED ORDER — METHOCARBAMOL 500 MG PO TABS
500.0000 mg | ORAL_TABLET | Freq: Four times a day (QID) | ORAL | Status: DC | PRN
  Administered 2023-04-26: 500 mg via ORAL

## 2023-04-26 MED ORDER — MEPERIDINE HCL 50 MG/ML IJ SOLN
6.2500 mg | INTRAMUSCULAR | Status: DC | PRN
Start: 2023-04-26 — End: 2023-04-26

## 2023-04-26 MED ORDER — HYDROMORPHONE HCL 1 MG/ML IJ SOLN
0.2500 mg | INTRAMUSCULAR | Status: DC | PRN
  Administered 2023-04-26: 0.5 mg via INTRAVENOUS

## 2023-04-26 MED ORDER — PHENYLEPHRINE HCL-NACL 20-0.9 MG/250ML-% IV SOLN
INTRAVENOUS | Status: AC
  Filled 2023-04-26: qty 250

## 2023-04-26 MED ORDER — ACETAMINOPHEN 325 MG PO TABS: 325.0000 mg | ORAL_TABLET | Freq: Once | ORAL | Status: DC | PRN

## 2023-04-26 MED ORDER — LACTATED RINGERS IV BOLUS: 500.0000 mL | Freq: Once | INTRAVENOUS | Status: DC

## 2023-04-26 MED ORDER — DROPERIDOL 2.5 MG/ML IJ SOLN: 0.6250 mg | Freq: Once | INTRAMUSCULAR | Status: DC | PRN

## 2023-04-26 MED ORDER — CHLORHEXIDINE GLUCONATE 0.12 % MT SOLN
15.0000 mL | Freq: Once | OROMUCOSAL | Status: AC
  Administered 2023-04-26: 15 mL via OROMUCOSAL

## 2023-04-26 MED ORDER — METHOCARBAMOL 500 MG PO TABS
ORAL_TABLET | ORAL | Status: AC
  Filled 2023-04-26: qty 1

## 2023-04-26 MED ORDER — POVIDONE-IODINE 10 % EX SWAB
2.0000 | Freq: Once | CUTANEOUS | Status: AC
  Administered 2023-04-26: 2 via TOPICAL

## 2023-04-26 MED ORDER — BUPIVACAINE LIPOSOME 1.3 % IJ SUSP
INTRAMUSCULAR | Status: DC | PRN
  Administered 2023-04-26: 20 mL

## 2023-04-26 MED ORDER — LACTATED RINGERS IV BOLUS: 250.0000 mL | Freq: Once | INTRAVENOUS | Status: DC

## 2023-04-26 MED ORDER — METHOCARBAMOL 1000 MG/10ML IJ SOLN: 500.0000 mg | Freq: Four times a day (QID) | INTRAMUSCULAR | Status: DC | PRN

## 2023-04-26 MED ORDER — ORAL CARE MOUTH RINSE: 15.0000 mL | Freq: Once | OROMUCOSAL | Status: AC

## 2023-04-26 MED ORDER — LEVOFLOXACIN IN D5W 500 MG/100ML IV SOLN
500.0000 mg | INTRAVENOUS | Status: DC
  Filled 2023-04-26: qty 100

## 2023-04-26 MED ORDER — HYDROMORPHONE HCL 1 MG/ML IJ SOLN
INTRAMUSCULAR | Status: AC
  Administered 2023-04-26: 0.5 mg via INTRAVENOUS
  Filled 2023-04-26: qty 1

## 2023-04-26 MED ORDER — SODIUM CHLORIDE (PF) 0.9 % IJ SOLN
INTRAMUSCULAR | Status: AC
  Filled 2023-04-26: qty 50

## 2023-04-26 MED ORDER — BUPIVACAINE LIPOSOME 1.3 % IJ SUSP
INTRAMUSCULAR | Status: AC
  Filled 2023-04-26: qty 20

## 2023-04-26 MED ORDER — MIDAZOLAM HCL 2 MG/2ML IJ SOLN
1.0000 mg | Freq: Once | INTRAMUSCULAR | Status: DC
  Filled 2023-04-26: qty 2

## 2023-04-26 MED ORDER — BUPIVACAINE-EPINEPHRINE 0.25% -1:200000 IJ SOLN
INTRAMUSCULAR | Status: DC | PRN
  Administered 2023-04-26: 30 mL

## 2023-04-26 MED ORDER — LACTATED RINGERS IV SOLN: INTRAVENOUS | Status: DC

## 2023-04-26 MED ORDER — ACETAMINOPHEN 10 MG/ML IV SOLN
1000.0000 mg | Freq: Once | INTRAVENOUS | Status: DC | PRN
  Administered 2023-04-26: 1000 mg via INTRAVENOUS

## 2023-04-26 MED ORDER — OXYCODONE HCL 5 MG PO TABS
ORAL_TABLET | ORAL | Status: AC
  Filled 2023-04-26: qty 1

## 2023-04-26 MED ORDER — ASPIRIN 81 MG PO TBEC: 81.0000 mg | DELAYED_RELEASE_TABLET | Freq: Two times a day (BID) | ORAL | 0 refills | Status: DC

## 2023-04-26 MED ORDER — PROPOFOL 500 MG/50ML IV EMUL
INTRAVENOUS | Status: DC | PRN
  Administered 2023-04-26: 40 ug/kg/min via INTRAVENOUS

## 2023-04-26 MED ORDER — BUPIVACAINE IN DEXTROSE 0.75-8.25 % IT SOLN
INTRATHECAL | Status: DC | PRN
  Administered 2023-04-26: 1.8 mL via INTRATHECAL

## 2023-04-26 MED ORDER — TRANEXAMIC ACID-NACL 1000-0.7 MG/100ML-% IV SOLN
1000.0000 mg | INTRAVENOUS | Status: AC
Start: 2023-04-26 — End: 2023-04-26
  Administered 2023-04-26: 1000 mg via INTRAVENOUS
  Filled 2023-04-26: qty 100

## 2023-04-26 MED ORDER — ONDANSETRON HCL 4 MG/2ML IJ SOLN
INTRAMUSCULAR | Status: DC | PRN
  Administered 2023-04-26: 4 mg via INTRAVENOUS

## 2023-04-26 MED ORDER — ACETAMINOPHEN 10 MG/ML IV SOLN
INTRAVENOUS | Status: AC
  Filled 2023-04-26: qty 100

## 2023-04-26 MED ORDER — OXYCODONE HCL 5 MG PO TABS: 10.0000 mg | ORAL_TABLET | ORAL | Status: DC | PRN

## 2023-04-26 MED ORDER — ROPIVACAINE HCL 5 MG/ML IJ SOLN
INTRAMUSCULAR | Status: DC | PRN
  Administered 2023-04-26: 30 mL via PERINEURAL

## 2023-04-26 MED ORDER — TRANEXAMIC ACID-NACL 1000-0.7 MG/100ML-% IV SOLN: 1000.0000 mg | Freq: Once | INTRAVENOUS | Status: DC

## 2023-04-26 MED ORDER — FENTANYL CITRATE PF 50 MCG/ML IJ SOSY
50.0000 ug | PREFILLED_SYRINGE | Freq: Once | INTRAMUSCULAR | Status: AC
  Administered 2023-04-26: 50 ug via INTRAVENOUS
  Filled 2023-04-26: qty 2

## 2023-04-26 MED ORDER — OXYCODONE HCL 5 MG PO TABS
5.0000 mg | ORAL_TABLET | ORAL | Status: DC | PRN
  Administered 2023-04-26: 5 mg via ORAL

## 2023-04-26 MED ORDER — IPRATROPIUM-ALBUTEROL 0.5-2.5 (3) MG/3ML IN SOLN
RESPIRATORY_TRACT | Status: AC
  Filled 2023-04-26: qty 3

## 2023-04-26 MED ORDER — 0.9 % SODIUM CHLORIDE (POUR BTL) OPTIME
TOPICAL | Status: DC | PRN
  Administered 2023-04-26: 1000 mL

## 2023-04-26 MED ORDER — CEFAZOLIN SODIUM-DEXTROSE 2-4 GM/100ML-% IV SOLN
2.0000 g | Freq: Once | INTRAVENOUS | Status: AC
  Administered 2023-04-26: 2 g via INTRAVENOUS
  Filled 2023-04-26: qty 100

## 2023-04-26 MED ORDER — TIZANIDINE HCL 2 MG PO TABS: 2.0000 mg | ORAL_TABLET | Freq: Four times a day (QID) | ORAL | 0 refills | Status: DC | PRN

## 2023-04-26 MED ORDER — IPRATROPIUM-ALBUTEROL 0.5-2.5 (3) MG/3ML IN SOLN
3.0000 mL | Freq: Once | RESPIRATORY_TRACT | Status: AC
  Administered 2023-04-26: 3 mL via RESPIRATORY_TRACT

## 2023-04-26 MED ORDER — PHENYLEPHRINE HCL-NACL 20-0.9 MG/250ML-% IV SOLN
INTRAVENOUS | Status: DC | PRN
  Administered 2023-04-26: 40 ug/min via INTRAVENOUS

## 2023-04-26 MED ORDER — DEXAMETHASONE SODIUM PHOSPHATE 10 MG/ML IJ SOLN
INTRAMUSCULAR | Status: DC | PRN
  Administered 2023-04-26: 10 mg via INTRAVENOUS

## 2023-04-26 SURGICAL SUPPLY — 48 items
ATTUNE PSFEM RTSZ5 NARCEM KNEE (Femur) IMPLANT
ATTUNE PSRP INSR SZ5 5 KNEE (Insert) IMPLANT
BAG COUNTER SPONGE SURGICOUNT (BAG) IMPLANT
BAG ZIPLOCK 12X15 (MISCELLANEOUS) ×1 IMPLANT
BASEPLATE TIBIAL ROTATING SZ 4 (Knees) IMPLANT
BENZOIN TINCTURE PRP APPL 2/3 (GAUZE/BANDAGES/DRESSINGS) ×1 IMPLANT
BLADE SAGITTAL 25.0X1.19X90 (BLADE) ×1 IMPLANT
BLADE SAW SGTL 13.0X1.19X90.0M (BLADE) ×1 IMPLANT
BLADE SURG SZ10 CARB STEEL (BLADE) ×2 IMPLANT
BNDG ELASTIC 6INX 5YD STR LF (GAUZE/BANDAGES/DRESSINGS) ×1 IMPLANT
BOOTIES KNEE HIGH SLOAN (MISCELLANEOUS) ×1 IMPLANT
BOWL SMART MIX CTS (DISPOSABLE) ×1 IMPLANT
CEMENT HV SMART SET (Cement) ×2 IMPLANT
CLSR STERI-STRIP ANTIMIC 1/2X4 (GAUZE/BANDAGES/DRESSINGS) IMPLANT
COVER SURGICAL LIGHT HANDLE (MISCELLANEOUS) ×1 IMPLANT
CUFF TRNQT CYL 34X4.125X (TOURNIQUET CUFF) ×1 IMPLANT
DRAPE INCISE IOBAN 66X45 STRL (DRAPES) ×1 IMPLANT
DRAPE U-SHAPE 47X51 STRL (DRAPES) ×1 IMPLANT
DRSG AQUACEL AG ADV 3.5X10 (GAUZE/BANDAGES/DRESSINGS) ×1 IMPLANT
DURAPREP 26ML APPLICATOR (WOUND CARE) ×1 IMPLANT
ELECT REM PT RETURN 15FT ADLT (MISCELLANEOUS) ×1 IMPLANT
GLOVE BIOGEL PI IND STRL 8 (GLOVE) ×2 IMPLANT
GLOVE ECLIPSE 7.5 STRL STRAW (GLOVE) ×2 IMPLANT
GOWN STRL REUS W/ TWL XL LVL3 (GOWN DISPOSABLE) ×2 IMPLANT
HOLDER FOLEY CATH W/STRAP (MISCELLANEOUS) IMPLANT
HOOD PEEL AWAY T7 (MISCELLANEOUS) ×3 IMPLANT
KIT TURNOVER KIT A (KITS) IMPLANT
MANIFOLD NEPTUNE II (INSTRUMENTS) ×1 IMPLANT
NDL HYPO 22X1.5 SAFETY MO (MISCELLANEOUS) ×2 IMPLANT
NEEDLE HYPO 22X1.5 SAFETY MO (MISCELLANEOUS) ×2
NS IRRIG 1000ML POUR BTL (IV SOLUTION) ×1 IMPLANT
PACK TOTAL KNEE CUSTOM (KITS) ×1 IMPLANT
PADDING CAST COTTON 6X4 STRL (CAST SUPPLIES) ×1 IMPLANT
PATELLA MEDIAL ATTUN 35MM KNEE (Knees) IMPLANT
PIN STEINMAN FIXATION KNEE (PIN) IMPLANT
PROTECTOR NERVE ULNAR (MISCELLANEOUS) ×1 IMPLANT
SET HNDPC FAN SPRY TIP SCT (DISPOSABLE) ×1 IMPLANT
SPIKE FLUID TRANSFER (MISCELLANEOUS) ×2 IMPLANT
STRIP CLOSURE SKIN 1/2X4 (GAUZE/BANDAGES/DRESSINGS) IMPLANT
SUT MNCRL AB 3-0 PS2 18 (SUTURE) ×1 IMPLANT
SUT VIC AB 0 CT1 36 (SUTURE) ×1 IMPLANT
SUT VIC AB 1 CT1 36 (SUTURE) ×2 IMPLANT
SUT VIC AB 2-0 CT1 TAPERPNT 27 (SUTURE) ×1 IMPLANT
SYR CONTROL 10ML LL (SYRINGE) ×2 IMPLANT
TRAY CATH INTERMITTENT SS 16FR (CATHETERS) IMPLANT
TUBE SUCTION HIGH CAP CLEAR NV (SUCTIONS) ×1 IMPLANT
WATER STERILE IRR 1000ML POUR (IV SOLUTION) ×2 IMPLANT
WRAP KNEE MAXI GEL POST OP (GAUZE/BANDAGES/DRESSINGS) ×1 IMPLANT

## 2023-04-26 NOTE — Anesthesia Procedure Notes (Signed)
Anesthesia Regional Block: Adductor canal block   Pre-Anesthetic Checklist: , timeout performed,  Correct Patient, Correct Site, Correct Laterality,  Correct Procedure, Correct Position, site marked,  Risks and benefits discussed,  Surgical consent,  Pre-op evaluation,  At surgeon's request and post-op pain management  Laterality: Right  Prep: chloraprep       Needles:  Injection technique: Single-shot  Needle Type: Echogenic Stimulator Needle     Needle Length: 9cm  Needle Gauge: 21     Additional Needles:   Procedures:,,,, ultrasound used (permanent image in chart),,    Narrative:  Start time: 04/26/2023 8:05 AM End time: 04/26/2023 8:10 AM Injection made incrementally with aspirations every 5 mL.  Performed by: Personally  Anesthesiologist: Shelton Silvas, MD  Additional Notes: Discussed risks and benefits of the nerve block in detail, including but not limited vascular injury, permanent nerve damage and infection.   Patient tolerated the procedure well. Local anesthetic introduced in an incremental fashion under minimal resistance after negative aspirations. No paresthesias were elicited. After completion of the procedure, no acute issues were identified and patient continued to be monitored by RN.

## 2023-04-26 NOTE — Anesthesia Preprocedure Evaluation (Addendum)
Anesthesia Evaluation  Patient identified by MRN, date of birth, ID band Patient awake    Reviewed: Allergy & Precautions, NPO status , Patient's Chart, lab work & pertinent test results  Airway Mallampati: I  TM Distance: >3 FB Neck ROM: Full    Dental  (+) Teeth Intact, Dental Advisory Given   Pulmonary sleep apnea , former smoker   breath sounds clear to auscultation       Cardiovascular hypertension, Pt. on home beta blockers and Pt. on medications  Rhythm:Regular Rate:Normal  Echo:   1. Left ventricular ejection fraction, by estimation, is 60 to 65%. The  left ventricle has normal function. The left ventricle has no regional  wall motion abnormalities. There is moderate concentric left ventricular  hypertrophy. Left ventricular  diastolic parameters are consistent with Grade I diastolic dysfunction  (impaired relaxation).   2. Right ventricular systolic function is normal. The right ventricular  size is normal.   3. The mitral valve is degenerative. Trivial mitral valve regurgitation.   4. The aortic valve is tricuspid. There is mild calcification of the  aortic valve. Aortic valve regurgitation is not visualized. Aortic valve  sclerosis/calcification is present, without any evidence of aortic  stenosis.   5. The inferior vena cava is normal in size with greater than 50%  respiratory variability, suggesting right atrial pressure of 3 mmHg.   Comparison(s): Prior images reviewed side by side. LVEF remains normal at  60-65%.      Neuro/Psych   Anxiety     TIA Neuromuscular disease CVA    GI/Hepatic Neg liver ROS, hiatal hernia,GERD  ,,  Endo/Other  negative endocrine ROS    Renal/GU negative Renal ROS     Musculoskeletal  (+) Arthritis ,    Abdominal   Peds  Hematology negative hematology ROS (+)   Anesthesia Other Findings   Reproductive/Obstetrics                              Anesthesia Physical Anesthesia Plan  ASA: 3  Anesthesia Plan: Spinal   Post-op Pain Management: Ofirmev IV (intra-op)*   Induction: Intravenous  PONV Risk Score and Plan: 3 and Ondansetron, Dexamethasone and Propofol infusion  Airway Management Planned: Natural Airway and Nasal Cannula  Additional Equipment: None  Intra-op Plan:   Post-operative Plan:   Informed Consent: I have reviewed the patients History and Physical, chart, labs and discussed the procedure including the risks, benefits and alternatives for the proposed anesthesia with the patient or authorized representative who has indicated his/her understanding and acceptance.       Plan Discussed with: CRNA  Anesthesia Plan Comments: (Lab Results      Component                Value               Date                      WBC                      6.0                 04/13/2023                HGB                      13.2  04/13/2023                HCT                      41.8                04/13/2023                MCV                      99.1                04/13/2023                PLT                      213                 04/13/2023            No clopidogrel for > 5 days)       Anesthesia Quick Evaluation

## 2023-04-26 NOTE — Evaluation (Signed)
Physical Therapy Evaluation Patient Details Name: Breanna [name] MRN: [id number] DOB: Jan 05, 1946 Today's Date: 04/26/2023  History of Present Illness  78 yo female presents to therapy s/p R TKA on 04/26/2023 due to failure of conservative measures. Pt PMH Includes but is not limited to: angina, GERD, HTN, anxiety, OSA, Ba Ca s/p L mastectomy, DDD, TIA/CVA, ACDF and PCDF, R RTC repair, and L TKA (2016).  Clinical Impression  Breanna [name] is a 78 y.o. female POD 0 s/p R TKA. Patient reports IND with mobility at baseline. Patient is now limited by functional impairments (see PT problem list below) and requires CGA and cues for transfers and gait with RW. Patient was able to ambulate 45 and  20 feet with RW and CGA and cues for safe walker management. Patient educated on safe sequencing for functional mobility tasks, fall risk prevention, pain management and goal, use of CP/ice, slowly increasing activity and car transfers pt and sister verbalized understanding of safe guarding position for people assisting with mobility. Patient instructed in exercises to facilitate ROM and circulation. Patient will benefit from continued skilled PT interventions to address impairments and progress towards PLOF. Patient has met mobility goals at adequate level for discharge home with family support and [facility] services; will continue to follow if pt continues acute stay to progress towards Mod I goals.       If plan is discharge home, recommend the following: A little help with walking and/or transfers;A little help with bathing/dressing/bathroom;Assistance with cooking/housework;Assist for transportation;Help with stairs or ramp for entrance   Can travel by private vehicle        Equipment Recommendations Rolling walker (2 wheels)  Recommendations for Other Services       Functional Status Assessment Patient has had a recent decline in their functional status and demonstrates the ability to make significant  improvements in function in a reasonable and predictable amount of time.     Precautions / Restrictions Precautions Precautions: Knee;Fall Restrictions Weight Bearing Restrictions Per Provider Order: No      Mobility  Bed Mobility Overal bed mobility: Needs Assistance Bed Mobility: Supine to Sit     Supine to sit: Contact guard, HOB elevated, Used rails     General bed mobility comments: min cues    Transfers Overall transfer level: Needs assistance Equipment used: Rolling walker (2 wheels) Transfers: Sit to/from Stand Sit to Stand: Contact guard assist           General transfer comment: min cues for safety and proper UE and AD placement for bed, commode and recliner transfers    Ambulation/Gait Ambulation/Gait assistance: Contact guard assist Gait Distance (Feet): 45 Feet Assistive device: Rolling walker (2 wheels) Gait Pattern/deviations: Step-to pattern Gait velocity: decreased     General Gait Details: min cues for safety and proper distance from RW, B UE support at RW to offload R LE in stance phase with intermittent R knee instabiltiy noted pt ed provided on co-contraction for improved stability and pt able to compensate with B UE support  Stairs            Wheelchair Mobility     Tilt Bed    Modified Rankin (Stroke Patients Only)       Balance Overall balance assessment: Needs assistance Sitting-balance support: Feet supported Sitting balance-Leahy Scale: Good     Standing balance support: Bilateral upper extremity supported, During functional activity, Reliant on assistive device for balance Standing balance-Leahy Scale: Fair Standing balance comment: static standing without  UE support with decreased R LE WB                             Pertinent Vitals/Pain Pain Assessment Pain Assessment: 0-10 Pain Score: 9  (0/10 at rest prior to activity and pain increased throughout duration of eval with mobility) Pain Location: R  knee and LE Pain Descriptors / Indicators: Aching, Constant, Heaviness, Operative site guarding (pullin g) Pain Intervention(s): Limited activity within patient's tolerance, Monitored during session, Premedicated before session, Repositioned, Ice applied    Home Living Family/patient expects to be discharged to:: Private residence Living Arrangements: Alone Available Help at Discharge: Family Type of Home: House Home Access: Level entry       Home Layout: One level Home Equipment: Cane - single point;Rollator (4 wheels) Additional Comments: sister will be able to provide 24/7 assist for first few days    Prior Function Prior Level of Function : Independent/Modified Independent;Driving             Mobility Comments: IND no AD with all ADLs, self care tasks and IADLs       Extremity/Trunk Assessment        Lower Extremity Assessment Lower Extremity Assessment: RLE deficits/detail RLE Deficits / Details: ankle DF/PF 5/5; SLR < 10 degree lag RLE Sensation: WNL    Cervical / Trunk Assessment Cervical / Trunk Assessment: Neck Surgery  Communication   Communication Communication: No apparent difficulties    Cognition Arousal: Alert Behavior During Therapy: WFL for tasks assessed/performed   PT - Cognitive impairments: No apparent impairments                         Following commands: Intact       Cueing       General Comments      Exercises Total Joint Exercises Ankle Circles/Pumps: AROM, Both, 10 reps Quad Sets: AROM, Right, 5 reps Short Arc Quad: AROM, Right, 5 reps Heel Slides: AROM, Right, 5 reps Hip ABduction/ADduction: AROM, Right, 5 reps Straight Leg Raises: AROM, Right, 5 reps Knee Flexion: AROM, Right, 5 reps, Seated   Assessment/Plan    PT Assessment Patient needs continued PT services  PT Problem List Decreased strength;Decreased range of motion;Decreased activity tolerance;Decreased balance;Decreased mobility;Decreased  coordination;Pain       PT Treatment Interventions DME instruction;Gait training;Stair training;Functional mobility training;Therapeutic activities;Therapeutic exercise;Balance training;Neuromuscular re-education;Patient/family education;Modalities    PT Goals (Current goals can be found in the Care Plan section)  Acute Rehab PT Goals Patient Stated Goal: to be able to take dancing lessons PT Goal Formulation: With patient Time For Goal Achievement: 05/10/23 Potential to Achieve Goals: Good    Frequency 7X/week     Co-evaluation               AM-PAC PT "6 Clicks" Mobility  Outcome Measure Help needed turning from your back to your side while in a flat bed without using bedrails?: None Help needed moving from lying on your back to sitting on the side of a flat bed without using bedrails?: A Little Help needed moving to and from a bed to a chair (including a wheelchair)?: A Little Help needed standing up from a chair using your arms (e.g., wheelchair or bedside chair)?: A Little Help needed to walk in hospital room?: A Little Help needed climbing 3-5 steps with a railing? : A Little 6 Click Score: 19    End of Session  Equipment Utilized During Treatment: Gait belt       PT Visit Diagnosis: Unsteadiness on feet (R26.81);Other abnormalities of gait and mobility (R26.89);Muscle weakness (generalized) (M62.81);History of falling (Z91.81);Pain;Difficulty in walking, not elsewhere classified (R26.2) Pain - Right/Left: Right Pain - part of body: Leg;Knee    Time: 1610-9604 PT Time Calculation (min) (ACUTE ONLY): 54 min   Charges:   PT Evaluation $PT Eval Low Complexity: 1 Low PT Treatments $Gait Training: 8-22 mins $Therapeutic Exercise: 8-22 mins $Therapeutic Activity: 8-22 mins PT General Charges $$ ACUTE PT VISIT: 1 Visit         Johnny Bridge, PT Acute Rehab   Jacqualyn Posey 04/26/2023, 3:15 PM

## 2023-04-26 NOTE — Anesthesia Procedure Notes (Signed)
Procedure Name: MAC Date/Time: 04/26/2023 8:50 AM  Performed by: Randa Evens, CRNAPre-anesthesia Checklist: Patient identified, Emergency Drugs available, Suction available, Patient being monitored and Timeout performed Oxygen Delivery Method: Simple face mask

## 2023-04-26 NOTE — Interval H&P Note (Signed)
History and Physical Interval Note:  04/26/2023 7:19 AM  Breanna [name]  has presented today for surgery, with the diagnosis of RIGHT KNEE DEGENERATIVE JOINT DISEASE.  The various methods of treatment have been discussed with the patient and family. After consideration of risks, benefits and other options for treatment, the patient has consented to  Procedure(s): TOTAL KNEE ARTHROPLASTY (Right) as a surgical intervention.  The patient's history has been reviewed, patient examined, no change in status, stable for surgery.  I have reviewed the patient's chart and labs.  Questions were answered to the patient's satisfaction.     Harvie Junior

## 2023-04-26 NOTE — Anesthesia Procedure Notes (Signed)
Spinal  Start time: 04/26/2023 8:33 AM End time: 04/26/2023 8:36 AM Reason for block: surgical anesthesia Staffing Performed: anesthesiologist  Anesthesiologist: Shelton Silvas, MD Performed by: Shelton Silvas, MD Authorized by: Shelton Silvas, MD   Preanesthetic Checklist Completed: patient identified, IV checked, site marked, risks and benefits discussed, surgical consent, monitors and equipment checked, pre-op evaluation and timeout performed Spinal Block Patient position: sitting Prep: DuraPrep and site prepped and draped Location: L3-4 Injection technique: single-shot Needle Needle type: Pencan  Needle gauge: 24 G Needle length: 10 cm Needle insertion depth: 10 cm Additional Notes Patient tolerated well. No immediate complications.  Functioning IV was confirmed and monitors were applied. Sterile prep and drape, including hand hygiene and sterile gloves were used. The patient was positioned and the back was prepped. The skin was anesthetized with lidocaine. Free flow of clear CSF was obtained prior to injecting local anesthetic into the CSF. The spinal needle aspirated freely following injection. The needle was carefully withdrawn. The patient tolerated the procedure well.

## 2023-04-26 NOTE — Op Note (Signed)
PATIENT ID:      Breanna [name]  MRN:     [id number] DOB/AGE:    Aug 07, 1945 / 78 y.o.       OPERATIVE REPORT   DATE OF PROCEDURE:  04/26/2023      PREOPERATIVE DIAGNOSIS:   RIGHT KNEE DEGENERATIVE JOINT DISEASE      Estimated body mass index is 36.73 kg/m as calculated from the following:   Height as of 04/13/23: 5\' 4"  (1.626 m).   Weight as of this encounter: 97.1 kg.                                                       POSTOPERATIVE DIAGNOSIS:   Same                                                                  PROCEDURE:  Procedure(s): TOTAL KNEE ARTHROPLASTY Using DepuyAttune RP implants #5N Femur, #4Tibia, 5 mm Attune RP bearing, 35 Patella    SURGEON: Harvie Junior  ASSISTANT:   Tomi Likens. [employer]   (Present and scrubbed throughout the case, critical for assistance with exposure, retraction, instrumentation, and closure.)        ANESTHESIA: spinal, 20cc Exparel, 50cc 0.25% Marcaine EBL: min cc FLUID REPLACEMENT: unk cc crystaloid TOURNIQUET: DRAINS: None TRANEXAMIC ACID: 1gm IV, 2gm topical COMPLICATIONS:  None         INDICATIONS FOR PROCEDURE: The patient has  RIGHT KNEE DEGENERATIVE JOINT DISEASE, varus deformities, XR shows bone on bone arthritis, lateral subluxation of tibia. Patient has failed all conservative measures including anti-inflammatory medicines, narcotics, attempts at exercise and weight loss, cortisone injections and viscosupplementation.  Risks and benefits of surgery have been discussed, questions answered.   DESCRIPTION OF PROCEDURE: The patient identified by armband, received  IV antibiotics, in the holding area at [facility]. Patient taken to the operating room, appropriate anesthetic monitors were attached, and spinal anesthesia was  induced. IV Tranexamic acid was given. Lateral post and 2 surefoot positioners applied to the table, the lower extremity was then prepped and draped in usual sterile fashion from the toes to the high  thigh. Time-out procedure was performed. Tomi Likens. [facility] PAC, was present and scrubbed throughout the case, critical for assistance with, positioning, exposure, retraction, instrumentation, and closure.The skin and subcutaneous tissue along the incision was injected with 20 cc of a mixture of 20cc Exparel and 30cc Marcaine 50cc saline solution, using a 21-gauge by 1-1/2 inch needle. We began the operation, with the knee flexed 130 degrees, by making the anterior midline incision starting at handbreadth above the patella going over the patella 1 cm medial to and 4 cm distal to the tibial tubercle. Small bleeders in the skin and the subcutaneous tissue identified and cauterized. Transverse retinaculum was incised and reflected medially and a medial parapatellar arthrotomy was accomplished. the patella was everted and theprepatellar fat pad resected. The superficial medial collateral ligament was then elevated from anterior to posterior along the proximal flare of the tibia and anterior half of the menisci resected. The knee was hyperflexed exposing bone on  bone arthritis. Peripheral and notch osteophytes as well as the cruciate ligaments were then resected. We continued to work our way around posteriorly along the proximal tibia, and externally rotated the tibia subluxing it out from underneath the femur. A McHale PCL retractor was placed through the notch, a lateral Hohmann retractor, and anterolateral small homan retractor placed. We then entered the proximal tibia with the Depuy starter drill in line with the axis of the tibia followed by an intramedullary guide rod and 3-degree posterior slope cutting guide. The tibial cutting guide, was pinned into place allowing resection of 2 mm of bone medially and 10 mm of bone laterally. Satisfied with the tibial resection, we then entered the distal femur 2 mm anterior to the PCL origin with the starter drill, followed by the intramedullary guide rod and applied the distal  femoral cutting guide set at 9 mm, with 5 degrees of valgus. This was pinned along the epicondylar axis. At this point, the distal femoral cut was accomplished without difficulty. We then sized for a #5N femoral component and pinned the chamfer guide in 3 degrees of external rotation. The anterior, posterior, and chamfer cuts were accomplished without difficulty followed by the Attune RP box cutting guide and the box cut. We also removed posterior osteophytes from the posterior femoral condyles. The posterior capsule was injected with Exparel solution. The knee was brought into full extension. We checked our extension gap and fit a 5 mm trial lollipop. Distracting in extension with a lamina spreader,  bleeders in the posterior capsule, Posterior medial and posterior lateral gutter were cauterized.  The transexamic acid-soaked sponge was then placed in the gap of the knee in extension. The knee was flexed 30. The posterior patella cut was accomplished with the 9.5 mm Attune cutting guide, sized for a 35 mm dome, and the fixation pegs drilled.The knee was then once again hyperflexed exposing the proximal tibia. We sized for a # 4 tibial base plate, applied the smokestack and the conical reamer followed by the the Delta fin keel punch. We then hammered into place the Attune RP trial femoral component, drilled the lugs, inserted a  5 mm trial bearing, trial patellar button, and took the knee through range of motion from 0-130 degrees. Medial and lateral ligamentous stability was checked. No thumb pressure was required for patellar Tracking.  All trial components were removed, mating surfaces irrigated with pulse lavage, and dried with suction and sponges. 10 cc of the Exparel solution was applied to the cancellus bone of the patella distal femur and proximal tibia.  After waiting 30 seconds, the bony surfaces were again, dried with sponges. A double batch of DePuy HV cement was mixed and applied to all bony metallic  mating surfaces except for the posterior condyles of the femur itself. In order, we hammered into place the tibial tray and removed excess cement, the femoral component and removed excess cement. The final Attune RP bearing was inserted, and the knee brought to full extension with compression. The patellar button was clamped into place, and excess cement removed. The knee was held at 30 flexion with compression using the second surefoot, while the cement cured. The wound was irrigated out with normal saline solution pulse lavage. The rest of the Exparel was injected into the parapatellar arthrotomy, subcutaneous tissues, and periosteal tissues. The parapatellar arthrotomy was closed with running #1 Vicryl suture. The subcutaneous tissue with 3-0 undyed Vicryl suture, and the skin with running 3-0 SQ vicryl. An Aquacil dressing  and Ace wrap were applied. The patient was taken to recovery room without difficulty.   Harvie Junior 04/26/2023, 12:13 PM

## 2023-04-26 NOTE — Transfer of Care (Signed)
Immediate Anesthesia Transfer of Care Note  Patient: Breanna [name]  Procedure(s) Performed: TOTAL KNEE ARTHROPLASTY (Right: Knee)  Patient Location: PACU  Anesthesia Type:MAC and Spinal  Level of Consciousness: alert   Airway & Oxygen Therapy: Patient Spontanous Breathing  Post-op Assessment: Report given to RN  Post vital signs: Reviewed and stable  Last Vitals:  Vitals Value Taken Time  BP 136/55 04/26/23 1050  Temp    Pulse 69 04/26/23 1055  Resp 10 04/26/23 1055  SpO2 97 % 04/26/23 1055  Vitals shown include unfiled device data.  Last Pain:  Vitals:   04/26/23 0816  TempSrc:   PainSc: 0-No pain         Complications: No notable events documented.

## 2023-04-26 NOTE — Anesthesia Postprocedure Evaluation (Signed)
Anesthesia Post Note  Patient: Breanna [name]  Procedure(s) Performed: TOTAL KNEE ARTHROPLASTY (Right: Knee)     Patient location during evaluation: PACU Anesthesia Type: Spinal Level of consciousness: oriented and awake and alert Pain management: pain level controlled Vital Signs Assessment: post-procedure vital signs reviewed and stable Respiratory status: spontaneous breathing, respiratory function stable and patient connected to nasal cannula oxygen Cardiovascular status: blood pressure returned to baseline and stable Postop Assessment: no headache, no backache and no apparent nausea or vomiting Anesthetic complications: no  No notable events documented.  Last Vitals:  Vitals:   04/26/23 1300 04/26/23 1315  BP: 131/60 132/77  Pulse: 76 90  Resp:    Temp:    SpO2: (!) 86% (!) 86%    Last Pain:  Vitals:   04/26/23 1300  TempSrc:   PainSc: 0-No pain                 Shelton Silvas

## 2023-04-26 NOTE — Discharge Instructions (Addendum)

## 2023-04-26 NOTE — Progress Notes (Signed)
Orthopedic Tech Progress Note Patient Details:  Breanna [name] [birth year] [id number]  Ortho Devices Type of Ortho Device: Bone foam zero knee Ortho Device/Splint Location: RLE Ortho Device/Splint Interventions: Ordered, Application, Adjustment   Post Interventions Patient Tolerated: Well Instructions Provided: Care of device  [place] Gerilyn Pilgrim 04/26/2023, 11:12 AM

## 2023-04-27 ENCOUNTER — Encounter (HOSPITAL_COMMUNITY): Payer: Self-pay | Admitting: Orthopedic Surgery

## 2023-04-27 ENCOUNTER — Other Ambulatory Visit: Payer: Self-pay

## 2023-06-16 ENCOUNTER — Encounter: Payer: Self-pay | Admitting: Gastroenterology

## 2023-07-12 ENCOUNTER — Other Ambulatory Visit: Payer: Self-pay | Admitting: Gastroenterology

## 2023-07-12 DIAGNOSIS — K580 Irritable bowel syndrome with diarrhea: Secondary | ICD-10-CM

## 2023-07-12 MED ORDER — VIBERZI 75 MG PO TABS: 1.0000 | ORAL_TABLET | Freq: Two times a day (BID) | ORAL | 5 refills | Status: DC

## 2023-07-14 ENCOUNTER — Telehealth (HOSPITAL_BASED_OUTPATIENT_CLINIC_OR_DEPARTMENT_OTHER): Payer: Self-pay

## 2023-07-14 NOTE — Telephone Encounter (Signed)
 [facility] note dated 01/22/23 was printed and faxed  The note contains benefits and usage regarding BIPAP   Faxed to [phone]      Copied from CRM [id number]. Topic: Clinical - Medical Advice >> Jul 14, 2023 10:53 AM Hilton Lucky wrote: Reason for CRM: Sherrie with [employer] is calling to request an addendum be made to most recent appointment. Insurance is refusing to cover the bipap and supplies because the note does not indicate ongoing usage or benefits. Please return call at [phone]. [facility] and ask to speak to [employer].

## 2023-07-23 ENCOUNTER — Telehealth: Payer: Self-pay | Admitting: *Deleted

## 2023-07-23 DIAGNOSIS — G4733 Obstructive sleep apnea (adult) (pediatric): Secondary | ICD-10-CM

## 2023-07-23 NOTE — Telephone Encounter (Signed)
 Copied from CRM [id number]. Topic: General - Other >> Jul 05, 2023  2:00 PM Hilton Lucky wrote: Reason for CRM: Patient is calling to schedule an appointment to follow up on CPAP care.   Patient called several times requesting supplies from her company [employer] and they have been non-responsive, up until the most recent time when they told her she must be seen again. Patient is in need of new tubing and a new mask for her CPAP, as the tubing has holes and cracks in it. States there is leakage and tape is the only thing holding it together. Patient states she also needs the 'rubber piece' that is thin from washing and a mask, as hers is worn down significantly.   Can orders be placed for this, where upcoming appointment is scheduled? If so, please reach out to patient to notify orders have been placed. >> Jul 12, 2023 10:33 AM Margarette Shawl wrote: Patient calling in again, after [employer] advised her she would need a note from Dr. French Jester stating she has been compliant with visits/treatment before new supplies will be sent out.   In need of tubing, mask, and filters as they have not been replaces in at least 4 months.   Please reach out to patient to notify orders have been placed.            ATC patient x1.  Left detailed VM per DPR letting her know that she was last seen (video visit) on 01/22/2023 and I would send in an order to [facility] for supplies.  Advised to return call.   See note from Jamie (07/14/2023), documentation faxed from [facility] (01/22/2024)

## 2023-07-27 ENCOUNTER — Ambulatory Visit: Admitting: Gastroenterology

## 2023-08-18 ENCOUNTER — Encounter: Payer: Self-pay | Admitting: *Deleted

## 2023-08-24 ENCOUNTER — Ambulatory Visit (INDEPENDENT_AMBULATORY_CARE_PROVIDER_SITE_OTHER): Admitting: Gastroenterology

## 2023-08-24 ENCOUNTER — Ambulatory Visit: Admitting: Gastroenterology

## 2023-08-24 VITALS — BP 133/69 | HR 69 | Temp 98.6°F | Ht 64.5 in | Wt 218.8 lb

## 2023-08-24 DIAGNOSIS — K219 Gastro-esophageal reflux disease without esophagitis: Secondary | ICD-10-CM | POA: Diagnosis not present

## 2023-08-24 DIAGNOSIS — R140 Abdominal distension (gaseous): Secondary | ICD-10-CM | POA: Diagnosis not present

## 2023-08-24 DIAGNOSIS — R197 Diarrhea, unspecified: Secondary | ICD-10-CM | POA: Diagnosis not present

## 2023-08-24 DIAGNOSIS — K580 Irritable bowel syndrome with diarrhea: Secondary | ICD-10-CM | POA: Insufficient documentation

## 2023-08-24 MED ORDER — PANTOPRAZOLE SODIUM 20 MG PO TBEC: 20.0000 mg | DELAYED_RELEASE_TABLET | Freq: Every day | ORAL | 3 refills | Status: DC
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
## 2023-08-24 NOTE — Patient Instructions (Addendum)
 Let's stop Prevacid . Instead, you can take pantoprazole  20 milligrams each morning, 30 minutes before breakfast for reflux.  Sucraid should be refrigerated at 68F-62F [facility]) and should be protected from heat and light; single-use containers can be removed from refrigeration and stored at 66F-53F (15C-25C) for up to 3 days (72 hours)   We will see you in 6 months! If you notice you still have issues with gas and bloating, we can try a course of Xifaxan three times a day for 2 weeks.  It was a pleasure to see you today. I want to create trusting relationships with patients and provide genuine, compassionate, and quality care. I truly value your feedback, so please be on the lookout for a survey regarding your visit with me today. I appreciate your time in completing this!         Delman Ferns, PhD, ANP-BC [facility] Gastroenterology
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
## 2023-08-26 ENCOUNTER — Ambulatory Visit: Admitting: Gastroenterology

## 2023-09-07 ENCOUNTER — Encounter: Payer: Self-pay | Admitting: Adult Health

## 2023-09-07 ENCOUNTER — Ambulatory Visit (INDEPENDENT_AMBULATORY_CARE_PROVIDER_SITE_OTHER): Admitting: Adult Health

## 2023-09-07 ENCOUNTER — Other Ambulatory Visit: Payer: Self-pay | Admitting: Adult Health

## 2023-09-07 VITALS — BP 130/60 | HR 75 | Ht 64.0 in | Wt 219.2 lb

## 2023-09-07 DIAGNOSIS — Z87891 Personal history of nicotine dependence: Secondary | ICD-10-CM | POA: Diagnosis not present

## 2023-09-07 DIAGNOSIS — I10 Essential (primary) hypertension: Secondary | ICD-10-CM | POA: Diagnosis not present

## 2023-09-07 DIAGNOSIS — Z6837 Body mass index (BMI) 37.0-37.9, adult: Secondary | ICD-10-CM

## 2023-09-07 DIAGNOSIS — G4733 Obstructive sleep apnea (adult) (pediatric): Secondary | ICD-10-CM

## 2023-09-07 MED ORDER — ZEPBOUND 2.5 MG/0.5ML ~~LOC~~ SOAJ: 2.5000 mg | SUBCUTANEOUS | 0 refills | Status: DC

## 2023-09-07 NOTE — Patient Instructions (Addendum)
 Albuterol  inhaler 1-2 puffs every 6hr as needed .   Adjust BIPAP pressure 20/14.  Continue on BIPAP At bedtime, wear all night long.  Do not drive if sleepy  Mask fitting to DME sent   Begin Zepbound Injection weekly 2.5mg  .  Record daily weight  Zepbound handout as discussed.  Activity as discussed    Follow up in 4-6 weeks and As needed
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
## 2023-09-07 NOTE — Assessment & Plan Note (Signed)
 Continue on BiPAP at bedtime.  Try to use for more than 6 hours each night.  Will adjust pressure for comfort-change to 20/14. . Order for mask fitting at DME. Check DL in 4 weeks on return office visit.    Patient has severe  sleep apnea related to obesity with BMI >/30 (AHI 37)   posing significant cardiovascular risks. Patient has failed traditional weight loss measures with diet and exercise for >/6 months. Patient will be initiated on Zepbound (tirzepatide) for weight management. Zepbound is the only pharmaceutical treatment approved for moderate-to-severe OSA in adults who are overweight (BMI >/27) or obese (BMI >/30). The patient will continue lifestyle modifications, including structured nutrition and physical activity as directed. No other GLP1 therapy will be used simultaneously at this time. The patient does not have any FDA labeled contraindications to this agent, including pregnancy, lactation, hx or family history of medullary thyroid  cancer, or multiple endocrine neoplasia type II. Side effect profile has been reviewed with patient. Aware of red flag symptoms to notify of immediately or seek emergency care, including severe nausea/vomiting, inability to pass bowels or gas, severe abdominal pain/tenderness, jaundice.   Will begin Zepbound 2.5 mg injections weekly.  She will return for a follow-up office visit in 4 weeks.  Baseline labs including CBC, c-Met and TSH are pending, and lipid panel. If tolerating will increase by 2.5 mg every 4 weeks to a target dose of 15 mg weekly .   Plan  Patient Instructions  Albuterol  inhaler 1-2 puffs every 6hr as needed .   Adjust BIPAP pressure 20/14.  Continue on BIPAP At bedtime, wear all night long.  Do not drive if sleepy  Mask fitting to DME sent   Begin Zepbound Injection weekly 2.5mg  .  Record daily weight  Zepbound handout as discussed.  Activity as discussed    Follow up in 4-6 weeks and As needed

## 2023-09-07 NOTE — Assessment & Plan Note (Signed)
 Discussed healthy weight loss along with diet and exercise.

## 2023-09-08 LAB — LIPID PANEL
Cholesterol: 182 mg/dL (ref 0–200)
HDL: 38.2 mg/dL — ABNORMAL LOW (ref >39.00)
LDL Cholesterol: 108 mg/dL — ABNORMAL HIGH (ref 0–99)
NonHDL: 144.03
Total CHOL/HDL Ratio: 5
Triglycerides: 180 mg/dL — ABNORMAL HIGH (ref 0.0–149.0)
VLDL: 36 mg/dL (ref 0.0–40.0)

## 2023-09-08 LAB — COMPREHENSIVE METABOLIC PANEL WITH GFR
ALT: 19 U/L (ref 0–35)
AST: 22 U/L (ref 0–37)
Albumin: 4 g/dL (ref 3.5–5.2)
Alkaline Phosphatase: 80 U/L (ref 39–117)
BUN: 12 mg/dL (ref 6–23)
CO2: 32 meq/L (ref 19–32)
Calcium: 9 mg/dL (ref 8.4–10.5)
Chloride: 101 meq/L (ref 96–112)
Creatinine, Ser: 0.74 mg/dL (ref 0.40–1.20)
GFR: 78.08 mL/min (ref >60.00)
Glucose, Bld: 121 mg/dL — ABNORMAL HIGH (ref 70–99)
Potassium: 3.6 meq/L (ref 3.5–5.1)
Sodium: 139 meq/L (ref 135–145)
Total Bilirubin: 0.4 mg/dL (ref 0.2–1.2)
Total Protein: 6.6 g/dL (ref 6.0–8.3)

## 2023-09-08 LAB — CBC WITH DIFFERENTIAL/PLATELET
Basophils Absolute: 0.1 10*3/uL (ref 0.0–0.1)
Basophils Relative: 1.1 % (ref 0.0–3.0)
Eosinophils Absolute: 0.1 10*3/uL (ref 0.0–0.7)
Eosinophils Relative: 1.2 % (ref 0.0–5.0)
HCT: 38.5 % (ref 36.0–46.0)
Hemoglobin: 12.8 g/dL (ref 12.0–15.0)
Lymphocytes Relative: 27.8 % (ref 12.0–46.0)
Lymphs Abs: 1.3 10*3/uL (ref 0.7–4.0)
MCHC: 33.3 g/dL (ref 30.0–36.0)
MCV: 93.2 fl (ref 78.0–100.0)
Monocytes Absolute: 0.7 10*3/uL (ref 0.1–1.0)
Monocytes Relative: 14.2 % — ABNORMAL HIGH (ref 3.0–12.0)
Neutro Abs: 2.7 10*3/uL (ref 1.4–7.7)
Neutrophils Relative %: 55.7 % (ref 43.0–77.0)
Platelets: 224 10*3/uL (ref 150.0–400.0)
RBC: 4.13 Mil/uL (ref 3.87–5.11)
RDW: 14 % (ref 11.5–15.5)
WBC: 4.9 10*3/uL (ref 4.0–10.5)

## 2023-09-08 LAB — TSH: TSH: 1 u[IU]/mL (ref 0.35–5.50)

## 2023-09-14 ENCOUNTER — Ambulatory Visit: Payer: Self-pay | Admitting: Adult Health

## 2023-09-16 ENCOUNTER — Telehealth: Payer: Self-pay

## 2023-09-16 NOTE — Telephone Encounter (Signed)
*  Pulm  Pharmacy Patient Advocate Encounter   Received notification from CoverMyMeds that prior authorization for Zepbound  2.5MG /0.5ML pen-injectors  is required/requested.   Insurance verification completed.   The patient is insured through [place] .   Per test claim: PA required; PA submitted to above mentioned insurance via CoverMyMeds Key/confirmation #/EOC [id number] Status is pending

## 2023-09-17 ENCOUNTER — Other Ambulatory Visit (HOSPITAL_COMMUNITY): Payer: Self-pay

## 2023-09-17 NOTE — Telephone Encounter (Signed)
 Pharmacy Patient Advocate Encounter  Received notification from [employer] that Prior Authorization for Zepbound  2.5MG /0.5ML pen-injectors  has been APPROVED from 09/16/2023 to 03/08/2024. Ran test claim, Copay is $0.00. This test claim was processed through [facility]- copay amounts may vary at other pharmacies due to pharmacy/plan contracts, or as the patient moves through the different stages of their insurance plan.

## 2023-10-04 ENCOUNTER — Telehealth: Payer: Self-pay

## 2023-10-04 NOTE — Telephone Encounter (Signed)
 Pt phoned advising the Sucraid does not work for her anymore. After about 3 hours it stops working. She advised she ate a apple and it started back up. Please send in whatever antibiotic you were referring to at the time of her visit

## 2023-10-05 MED ORDER — RIFAXIMIN 550 MG PO TABS: 550.0000 mg | ORAL_TABLET | Freq: Three times a day (TID) | ORAL | 0 refills | Status: AC

## 2023-10-05 NOTE — Addendum Note (Signed)
 Addended by: SHIRLEAN THERISA ORN on: 10/05/2023 01:16 PM   Modules accepted: Orders

## 2023-10-05 NOTE — Telephone Encounter (Signed)
 I sent in Xifaxan  550 mg to take TID for 14 days. Sent to [facility].

## 2023-10-06 ENCOUNTER — Telehealth: Payer: Self-pay

## 2023-10-06 DIAGNOSIS — K580 Irritable bowel syndrome with diarrhea: Secondary | ICD-10-CM

## 2023-10-06 NOTE — Telephone Encounter (Signed)
 PA approved from 03/10/2023 through 03/08/2024. Pt is aware an documentation scanned to chart.

## 2023-10-06 NOTE — Telephone Encounter (Signed)
 PA done for Xifaxan  550mg  tab on Cover My Meds. Dx used: K58.0. waiting on a response from Cover My Meds;

## 2023-10-06 NOTE — Telephone Encounter (Signed)
 ERROR

## 2023-10-13 NOTE — Telephone Encounter (Signed)
 Documentation from [facility] needing a new Rx to be sent to them. Documentation scanned to the pt's chart. This medication has been approved until 03/08/2024.

## 2023-10-18 ENCOUNTER — Telehealth (INDEPENDENT_AMBULATORY_CARE_PROVIDER_SITE_OTHER): Payer: Self-pay | Admitting: Internal Medicine

## 2023-10-18 MED ORDER — VIBERZI 75 MG PO TABS: 1.0000 | ORAL_TABLET | Freq: Two times a day (BID) | ORAL | 5 refills | Status: DC

## 2023-10-18 NOTE — Telephone Encounter (Signed)
 I have printed. Just needs to be faxed. Thanks!

## 2023-10-18 NOTE — Telephone Encounter (Signed)
 See other telephone note regarding refill request.

## 2023-10-18 NOTE — Telephone Encounter (Signed)
 Rx has been faxed to [facility]

## 2023-10-18 NOTE — Addendum Note (Signed)
 Addended by: SHIRLEAN THERISA ORN on: 10/18/2023 12:59 PM   Modules accepted: Orders

## 2023-10-18 NOTE — Telephone Encounter (Signed)
 Pt of Dr Marijo left message after hours asking for a refill. I transferred call to Dr Marijo nurse. [phone]

## 2023-10-18 NOTE — Telephone Encounter (Signed)
 [facility] is needing a new Rx faxed over for the pt's viberzi .

## 2023-10-30 ENCOUNTER — Other Ambulatory Visit: Payer: Self-pay

## 2023-10-30 ENCOUNTER — Emergency Department (HOSPITAL_COMMUNITY)

## 2023-10-30 ENCOUNTER — Emergency Department (HOSPITAL_COMMUNITY): Admission: EM | Admit: 2023-10-30 | Discharge: 2023-10-30 | Disposition: A | Discharge status: Home / Self Care

## 2023-10-30 ENCOUNTER — Encounter (HOSPITAL_COMMUNITY): Payer: Self-pay

## 2023-10-30 DIAGNOSIS — I10 Essential (primary) hypertension: Secondary | ICD-10-CM | POA: Diagnosis not present

## 2023-10-30 DIAGNOSIS — Z7982 Long term (current) use of aspirin: Secondary | ICD-10-CM | POA: Diagnosis not present

## 2023-10-30 DIAGNOSIS — R519 Headache, unspecified: Secondary | ICD-10-CM | POA: Insufficient documentation

## 2023-10-30 LAB — URINALYSIS, ROUTINE W REFLEX MICROSCOPIC
Bacteria, UA: NONE SEEN
Bilirubin Urine: NEGATIVE
Glucose, UA: NEGATIVE mg/dL
Hgb urine dipstick: NEGATIVE
Ketones, ur: NEGATIVE mg/dL
Nitrite: NEGATIVE
Protein, ur: NEGATIVE mg/dL
Specific Gravity, Urine: 1.014 (ref 1.005–1.030)
pH: 7 (ref 5.0–8.0)

## 2023-10-30 LAB — CBC WITH DIFFERENTIAL/PLATELET
Abs Granulocyte: 2.7 K/uL (ref 1.5–6.5)
Abs Immature Granulocytes: 0.01 K/uL (ref 0.00–0.07)
Basophils Absolute: 0 K/uL (ref 0.0–0.1)
Basophils Relative: 1 %
Eosinophils Absolute: 0.1 K/uL (ref 0.0–0.5)
Eosinophils Relative: 2 %
HCT: 41.2 % (ref 36.0–46.0)
Hemoglobin: 13.5 g/dL (ref 12.0–15.0)
Immature Granulocytes: 0 %
Lymphocytes Relative: 27 %
Lymphs Abs: 1.2 K/uL (ref 0.7–4.0)
MCH: 32.2 pg (ref 26.0–34.0)
MCHC: 32.8 g/dL (ref 30.0–36.0)
MCV: 98.3 fL (ref 80.0–100.0)
Monocytes Absolute: 0.5 K/uL (ref 0.1–1.0)
Monocytes Relative: 11 %
Neutro Abs: 2.7 K/uL (ref 1.7–7.7)
Neutrophils Relative %: 59 %
Platelets: 200 K/uL (ref 150–400)
RBC: 4.19 MIL/uL (ref 3.87–5.11)
RDW: 13.4 % (ref 11.5–15.5)
WBC: 4.5 K/uL (ref 4.0–10.5)
nRBC: 0 % (ref 0.0–0.2)

## 2023-10-30 LAB — TROPONIN I (HIGH SENSITIVITY)
Troponin I (High Sensitivity): 6 ng/L (ref <18)
Troponin I (High Sensitivity): 6 ng/L (ref <18)

## 2023-10-30 LAB — COMPREHENSIVE METABOLIC PANEL WITH GFR
ALT: 22 U/L (ref 0–44)
AST: 24 U/L (ref 15–41)
Albumin: 3.9 g/dL (ref 3.5–5.0)
Alkaline Phosphatase: 76 U/L (ref 38–126)
Anion gap: 11 (ref 5–15)
BUN: 15 mg/dL (ref 8–23)
CO2: 26 mmol/L (ref 22–32)
Calcium: 9.1 mg/dL (ref 8.9–10.3)
Chloride: 103 mmol/L (ref 98–111)
Creatinine, Ser: 0.65 mg/dL (ref 0.44–1.00)
GFR, Estimated: >60 mL/min (ref >60)
Glucose, Bld: 87 mg/dL (ref 70–99)
Potassium: 4.4 mmol/L (ref 3.5–5.1)
Sodium: 140 mmol/L (ref 135–145)
Total Bilirubin: 0.6 mg/dL (ref 0.0–1.2)
Total Protein: 6.9 g/dL (ref 6.5–8.1)

## 2023-10-30 LAB — TSH: TSH: 0.666 u[IU]/mL (ref 0.350–4.500)

## 2023-10-30 LAB — MAGNESIUM: Magnesium: 2 mg/dL (ref 1.7–2.4)

## 2023-10-30 MED ORDER — KETOROLAC TROMETHAMINE 15 MG/ML IJ SOLN
15.0000 mg | Freq: Once | INTRAMUSCULAR | Status: AC
  Administered 2023-10-30: 15 mg via INTRAVENOUS
  Filled 2023-10-30: qty 1

## 2023-10-30 MED ORDER — DIPHENHYDRAMINE HCL 50 MG/ML IJ SOLN
25.0000 mg | Freq: Once | INTRAMUSCULAR | Status: AC
  Administered 2023-10-30: 25 mg via INTRAVENOUS
  Filled 2023-10-30: qty 1

## 2023-10-30 MED ORDER — IOHEXOL 350 MG/ML SOLN
75.0000 mL | Freq: Once | INTRAVENOUS | Status: AC | PRN
  Administered 2023-10-30: 75 mL via INTRAVENOUS

## 2023-10-30 MED ORDER — HYDRALAZINE HCL 20 MG/ML IJ SOLN
10.0000 mg | Freq: Once | INTRAMUSCULAR | Status: DC
  Administered 2023-10-30: 10 mg via INTRAVENOUS
  Filled 2023-10-30: qty 1

## 2023-10-30 MED ORDER — PROCHLORPERAZINE EDISYLATE 10 MG/2ML IJ SOLN
5.0000 mg | Freq: Once | INTRAMUSCULAR | Status: AC
  Administered 2023-10-30: 5 mg via INTRAVENOUS
  Filled 2023-10-30: qty 2

## 2023-10-30 MED ORDER — LABETALOL HCL 5 MG/ML IV SOLN
5.0000 mg | Freq: Once | INTRAVENOUS | Status: AC
  Administered 2023-10-30: 5 mg via INTRAVENOUS
  Filled 2023-10-30: qty 4

## 2023-10-30 MED ORDER — HYDRALAZINE HCL 20 MG/ML IJ SOLN
5.0000 mg | Freq: Once | INTRAMUSCULAR | Status: AC
  Administered 2023-10-30: 5 mg via INTRAVENOUS
  Filled 2023-10-30: qty 1

## 2023-10-30 NOTE — ED Provider Notes (Signed)
 [place] EMERGENCY DEPARTMENT AT [facility] Provider Note   CSN: [id number] Arrival date & time: 10/30/23  1352     Patient presents with: Hypertension   Breanna [name] is a 78 y.o. female.   Patient is a 78 year old female who presents to the emergency department chief complaint of headache, blurry vision which has been ongoing for approximate the past week.  She notes that she has had associated elevated blood pressures at home averaging between 199 and 210 systolic.  She notes that she has been compliant with all of her medications and notes that she has had no recent medication changes.  Patient states that she does feel as though when her blood pressure is rising it makes her headache worse as well as her blurry vision worse.  She notes that she has had some intermittent chest discomfort.  She denies any shortness of breath.  She denies any abdominal pain, nausea, vomiting, diarrhea.  She does note that she has had some increased appetite.  She denies any lower extremity pain or edema.   Hypertension Associated symptoms include headaches.       Prior to Admission medications   Medication Sig Start Date End Date Taking? Authorizing Provider  acetaminophen  (TYLENOL ) 650 MG CR tablet Take 1,300 mg by mouth in the morning.    [provider]  albuterol  (VENTOLIN  HFA) 108 (90 Base) MCG/ACT inhaler Inhale 1-2 puffs into the lungs every 6 (six) hours as needed. 07/27/22   Parrett, Madelin RAMAN, NP  aspirin  EC 81 MG tablet Take 1 tablet (81 mg total) by mouth 2 (two) times daily. 04/26/23   Orlando Camellia POUR, PA-C  bumetanide  (BUMEX ) 1 MG tablet Take 1 mg by mouth every Monday, Tuesday, Wednesday, Thursday, and Friday. 02/28/22   [provider]  carvedilol  (COREG ) 25 MG tablet Take 1 tablet (25 mg total) by mouth 2 (two) times daily with a meal. 03/17/18   Odean Potts, MD  cetirizine (ZYRTEC) 10 MG tablet Take 10 mg by mouth in the morning. Patient not taking:  Reported on 09/07/2023    [provider]  clopidogrel  (PLAVIX ) 75 MG tablet Take 1 tablet (75 mg total) by mouth every evening. 03/17/18   Gudena, Vinay, MD  Eluxadoline  (VIBERZI ) 75 MG TABS Take 1 tablet (75 mg total) by mouth 2 (two) times daily with a meal. 10/18/23   Shirlean Therisa ORN, NP  exemestane (AROMASIN) 25 MG tablet Take 25 mg by mouth at bedtime.    [provider]  hydrALAZINE  (APRESOLINE ) 100 MG tablet Take 100 mg by mouth 2 (two) times daily.    [provider]  lansoprazole  (PREVACID ) 15 MG capsule Take 1 capsule (15 mg total) by mouth 2 (two) times daily before a meal. 10/27/22 04/06/26  Shahmehdi, Adriana LABOR, MD  lisinopril  (PRINIVIL ,ZESTRIL ) 40 MG tablet Take 40 mg by mouth in the morning.    [provider]  magnesium  oxide (MAG-OX) 400 (240 Mg) MG tablet Take 400 mg by mouth at bedtime.    [provider]  mirtazapine (REMERON) 15 MG tablet Take 7.5 mg by mouth at bedtime.    [provider]  OVER THE COUNTER MEDICATION Take 1 capsule by mouth at bedtime as needed (sleep). Relaxium (melatonin/magnesium carlin)    [provider]  oxyCODONE -acetaminophen  (PERCOCET/ROXICET) 5-325 MG tablet Take 1 tablet by mouth every 4 (four) hours as needed for severe pain (pain score 7-10). Patient not taking: Reported on 09/07/2023 04/26/23   Orlando Camellia POUR,  PA-C  pantoprazole  (PROTONIX ) 20 MG tablet Take 1 tablet (20 mg total) by mouth daily. 30 minutes before breakfast Patient not taking: Reported on 09/07/2023 08/24/23   Shirlean Therisa ORN, NP  PARoxetine  (PAXIL ) 40 MG tablet Take 40 mg by mouth at bedtime.    [provider]  Polyethyl Glycol-Propyl Glycol (SYSTANE) 0.4-0.3 % SOLN Place 1-2 drops into both eyes 3 (three) times daily as needed (dry/irritated eyes.).    [provider]  tiZANidine  (ZANAFLEX ) 2 MG tablet Take 1 tablet (2 mg total) by mouth every 6 (six) hours as needed for muscle spasms. Patient not taking:  Reported on 09/07/2023 04/26/23   Orlando Camellia POUR, PA-C  ZEPBOUND  2.5 MG/0.5ML Pen INJECT 1 SYRINGE SUBCUTANEOUSLY ONCE A WEEK 09/08/23   Parrett, Madelin RAMAN, NP    Allergies: Atorvastatin, Vancomycin , Ancef  [cefazolin ], Lactose, Nasonex [mometasone furoate], and Neurontin  [gabapentin ]    Review of Systems  Neurological:  Positive for headaches.  All other systems reviewed and are negative.   Updated Vital Signs BP (!) 166/92 (BP Location: Right Arm)   Pulse 69   Temp 97.7 F (36.5 C) (Oral)   Resp 14   Ht 5' 4 (1.626 m)   Wt 98 kg   SpO2 94%   BMI 37.08 kg/m   Physical Exam Vitals and nursing note reviewed.  Constitutional:      General: She is not in acute distress.    Appearance: Normal appearance. She is not ill-appearing.  HENT:     Head: Normocephalic and atraumatic.     Nose: Nose normal.     Mouth/Throat:     Mouth: Mucous membranes are moist.  Eyes:     Extraocular Movements: Extraocular movements intact.     Conjunctiva/sclera: Conjunctivae normal.     Pupils: Pupils are equal, round, and reactive to light.  Cardiovascular:     Rate and Rhythm: Normal rate and regular rhythm.     Pulses: Normal pulses.     Heart sounds: Normal heart sounds. No murmur heard.    No gallop.  Pulmonary:     Effort: Pulmonary effort is normal. No respiratory distress.     Breath sounds: Normal breath sounds. No stridor. No wheezing, rhonchi or rales.  Abdominal:     General: Abdomen is flat. Bowel sounds are normal. There is no distension.     Palpations: Abdomen is soft.     Tenderness: There is no abdominal tenderness. There is no guarding.  Musculoskeletal:        General: Normal range of motion.     Cervical back: Normal range of motion and neck supple. No rigidity or tenderness.     Right lower leg: No edema.     Left lower leg: No edema.  Skin:    General: Skin is warm and dry.     Findings: No bruising or rash.  Neurological:     General: No focal deficit present.      Mental Status: She is alert and oriented to person, place, and time. Mental status is at baseline.     Cranial Nerves: No cranial nerve deficit.     Sensory: No sensory deficit.     Motor: No weakness.     Coordination: Coordination normal.     Gait: Gait normal.  Psychiatric:        Mood and Affect: Mood normal.        Behavior: Behavior normal.        Thought Content: Thought content normal.  Judgment: Judgment normal.     (all labs ordered are listed, but only abnormal results are displayed) Labs Reviewed  COMPREHENSIVE METABOLIC PANEL WITH GFR  CBC WITH DIFFERENTIAL/PLATELET  MAGNESIUM   TSH  URINALYSIS, ROUTINE W REFLEX MICROSCOPIC  TROPONIN I (HIGH SENSITIVITY)    EKG: None  Radiology: No results found.   Procedures   Medications Ordered in the ED  prochlorperazine  (COMPAZINE ) injection 5 mg (has no administration in time range)  diphenhydrAMINE  (BENADRYL ) injection 25 mg (25 mg Intravenous Given 10/30/23 1533)                                    Medical Decision Making Patient is doing well at this time and does remain stable.  Patient notes that headache has improved with treatment in the emergency department.  Her blood pressure does remain elevated.  Additional medications have been ordered.  Blood work has otherwise been unremarkable.  CTA head and neck as well as MRI brain is currently pending at this point.  Will sign patient out to Dr. Albertina pending final results and dispo.  Patient had no concerning neurological deficits noted on exam.  She has had negative serial troponins.  She has no active chest pain at this point.  EKG demonstrated no signs of acute ischemic changes.  Amount and/or Complexity of Data Reviewed Labs: ordered. Radiology: ordered.  Risk Prescription drug management.        Final diagnoses:  None    ED Discharge Orders     None          Daralene Lonni JONETTA DEVONNA 10/30/23 RETHA Albertina Dixon,  MD 10/31/23 [phone]

## 2023-10-30 NOTE — ED Triage Notes (Signed)
 Pt c/o headache and blurry vision starting on Wednesday this past week. Pt states her BP have had high readings averaging between 199/97-210/100. Pt states today's reading was 210/100 initially took her medication and states it lowered. Pt states she was trying to wait to see her PCP but was worried about a stroke because of her BP being high.

## 2023-11-09 ENCOUNTER — Ambulatory Visit (INDEPENDENT_AMBULATORY_CARE_PROVIDER_SITE_OTHER): Admitting: Adult Health

## 2023-11-09 ENCOUNTER — Encounter: Payer: Self-pay | Admitting: Adult Health

## 2023-11-09 VITALS — BP 150/70 | HR 81 | Ht 64.5 in | Wt 216.8 lb

## 2023-11-09 DIAGNOSIS — J4520 Mild intermittent asthma, uncomplicated: Secondary | ICD-10-CM | POA: Diagnosis not present

## 2023-11-09 DIAGNOSIS — Z6836 Body mass index (BMI) 36.0-36.9, adult: Secondary | ICD-10-CM

## 2023-11-09 DIAGNOSIS — Z87891 Personal history of nicotine dependence: Secondary | ICD-10-CM | POA: Diagnosis not present

## 2023-11-09 DIAGNOSIS — G4733 Obstructive sleep apnea (adult) (pediatric): Secondary | ICD-10-CM

## 2023-11-09 MED ORDER — ZEPBOUND 5 MG/0.5ML ~~LOC~~ SOAJ: 5.0000 mg | SUBCUTANEOUS | 0 refills | Status: DC

## 2023-11-09 NOTE — Patient Instructions (Addendum)
 Albuterol  inhaler 1-2 puffs every 6hr as needed .   Continue on BIPAP At bedtime, wear all night long.  Do not drive if sleepy   Increase Zepbound  Injection 5mg  weekly.  Record daily weight  Zepbound  handout as discussed.  Activity as discussed    Follow up in 3-4 weeks and As needed  (in person or virtual)
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
## 2023-11-30 ENCOUNTER — Encounter: Payer: Self-pay | Admitting: Adult Health

## 2023-11-30 ENCOUNTER — Ambulatory Visit: Admitting: Adult Health

## 2023-11-30 VITALS — BP 134/60 | HR 67 | Temp 97.7°F | Ht 64.0 in | Wt 218.0 lb

## 2023-11-30 DIAGNOSIS — J4520 Mild intermittent asthma, uncomplicated: Secondary | ICD-10-CM | POA: Diagnosis not present

## 2023-11-30 DIAGNOSIS — G4733 Obstructive sleep apnea (adult) (pediatric): Secondary | ICD-10-CM | POA: Diagnosis not present

## 2023-11-30 DIAGNOSIS — R1030 Lower abdominal pain, unspecified: Secondary | ICD-10-CM

## 2023-11-30 DIAGNOSIS — R319 Hematuria, unspecified: Secondary | ICD-10-CM

## 2023-11-30 DIAGNOSIS — I10 Essential (primary) hypertension: Secondary | ICD-10-CM | POA: Diagnosis not present

## 2023-11-30 DIAGNOSIS — R300 Dysuria: Secondary | ICD-10-CM

## 2023-11-30 DIAGNOSIS — Z87891 Personal history of nicotine dependence: Secondary | ICD-10-CM

## 2023-11-30 DIAGNOSIS — Z6837 Body mass index (BMI) 37.0-37.9, adult: Secondary | ICD-10-CM

## 2023-11-30 DIAGNOSIS — Z5181 Encounter for therapeutic drug level monitoring: Secondary | ICD-10-CM

## 2023-11-30 LAB — COMPREHENSIVE METABOLIC PANEL WITH GFR
ALT: 17 U/L (ref 0–35)
AST: 20 U/L (ref 0–37)
Albumin: 4.3 g/dL (ref 3.5–5.2)
Alkaline Phosphatase: 66 U/L (ref 39–117)
BUN: 19 mg/dL (ref 6–23)
CO2: 32 meq/L (ref 19–32)
Calcium: 9.8 mg/dL (ref 8.4–10.5)
Chloride: 101 meq/L (ref 96–112)
Creatinine, Ser: 0.75 mg/dL (ref 0.40–1.20)
GFR: 76.7 mL/min (ref >60.00)
Glucose, Bld: 109 mg/dL — ABNORMAL HIGH (ref 70–99)
Potassium: 3.8 meq/L (ref 3.5–5.1)
Sodium: 139 meq/L (ref 135–145)
Total Bilirubin: 0.3 mg/dL (ref 0.2–1.2)
Total Protein: 7 g/dL (ref 6.0–8.3)

## 2023-11-30 LAB — URINALYSIS, ROUTINE W REFLEX MICROSCOPIC
Bilirubin Urine: NEGATIVE
Hgb urine dipstick: NEGATIVE
Ketones, ur: NEGATIVE
Nitrite: NEGATIVE
RBC / HPF: NONE SEEN (ref 0–?)
Specific Gravity, Urine: 1.02 (ref 1.000–1.030)
Total Protein, Urine: NEGATIVE
Urine Glucose: NEGATIVE
Urobilinogen, UA: 0.2 (ref 0.0–1.0)
pH: 6 (ref 5.0–8.0)

## 2023-11-30 MED ORDER — ZEPBOUND 7.5 MG/0.5ML ~~LOC~~ SOAJ: 7.5000 mg | SUBCUTANEOUS | 0 refills | Status: AC
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
## 2023-11-30 NOTE — Patient Instructions (Addendum)
 Albuterol  inhaler 1-2 puffs every 6hr as needed .   Continue on BIPAP At bedtime, wear all night long.  Do not drive if sleepy   Increase Zepbound  Injection 7.5 mg weekly.  Keep weight log.  Zepbound  handout as discussed.  Activity as discussed   Check labs today  Follow up with GYN and Cardiology as planned  If urinary symptoms are not resolving may need to hold Zepbound  for couple of weeks.  CT renal study to rule out kidney stone .  Please contact office for sooner follow up if symptoms do not improve or worsen or seek emergency care    Call or send message in 4 weeks to go to next dose increase (10mg )  Follow up in 2 months and As needed

## 2023-12-01 ENCOUNTER — Ambulatory Visit: Payer: Self-pay | Admitting: Adult Health

## 2023-12-02 ENCOUNTER — Ambulatory Visit: Payer: Self-pay | Admitting: Adult Health

## 2023-12-02 ENCOUNTER — Encounter: Payer: Self-pay | Admitting: Adult Health

## 2023-12-02 MED ORDER — CIPROFLOXACIN HCL 250 MG PO TABS: 250.0000 mg | ORAL_TABLET | Freq: Two times a day (BID) | ORAL | 0 refills | Status: AC

## 2023-12-03 LAB — URINE CULTURE
MICRO NUMBER:: 17005115
SPECIMEN QUALITY:: ADEQUATE

## 2023-12-03 NOTE — Telephone Encounter (Signed)
 Copied from CRM [id number]. Topic: Clinical - Lab/Test Results >> Dec 02, 2023  2:09 PM Russell PARAS wrote: Reason for CRM:   Pt returning call from Tammy concerning her urine culture results. Reviewed chart and reported to pt the note left by provider. Pt reports understanding and has no further questions.

## 2023-12-06 ENCOUNTER — Ambulatory Visit
Admission: RE | Admit: 2023-12-06 | Discharge: 2023-12-06 | Disposition: A | Source: Ambulatory Visit | Attending: Adult Health | Admitting: Adult Health

## 2023-12-06 DIAGNOSIS — R1030 Lower abdominal pain, unspecified: Secondary | ICD-10-CM

## 2023-12-14 ENCOUNTER — Telehealth: Payer: Self-pay

## 2023-12-14 NOTE — Telephone Encounter (Signed)
 Documentation from [employer] needing your attention regarding pt's SUCRAID SD. Medication will be delivered 12/17/2023. They are needing you to send a new Rx. In the folder

## 2023-12-14 NOTE — Telephone Encounter (Signed)
 Patient told me this was not working for her any longer, so I haven't done further refills.

## 2023-12-15 NOTE — Telephone Encounter (Signed)
 Noted:  Faxed same paperwork back to [facility] advising the pt no longer takes this medication.

## 2023-12-16 ENCOUNTER — Telehealth: Payer: Self-pay | Admitting: *Deleted

## 2023-12-16 NOTE — Telephone Encounter (Signed)
 I have not attempted to contact pt

## 2023-12-16 NOTE — Telephone Encounter (Signed)
 Copied from CRM #[id number]. Topic: General - Call Back - No Documentation >> Dec 10, 2023  2:38 PM Devaughn RAMAN wrote: Reason for CRM: Patient called and stated she received a text to return a call to the office from [facility]. Patient stated she is unsure what the call is regarding but she wanted to follow up. Patient was thankful and verbalized understanding.   I called and spoke with the pt  She stated that she is returning call to [facility] She received a VM that [facility] called and needed her to call her back but did not state why I do not see where [facility] called or need for calling the pt  I advised pt it may have been error, but will forward to the PCC's to make sure Ronal Lacks didn't try to call her  She is aware we will call her back if needed If you did not call the pt please document that and close this encounter, thanks!

## 2024-01-12 ENCOUNTER — Other Ambulatory Visit: Payer: Self-pay | Admitting: Adult Health

## 2024-02-24 ENCOUNTER — Telehealth: Payer: Self-pay | Admitting: *Deleted

## 2024-02-24 ENCOUNTER — Encounter: Payer: Self-pay | Admitting: Gastroenterology

## 2024-02-24 ENCOUNTER — Ambulatory Visit: Admitting: Gastroenterology

## 2024-02-24 VITALS — BP 135/69 | HR 86 | Temp 98.1°F | Ht 64.5 in | Wt 220.4 lb

## 2024-02-24 DIAGNOSIS — K580 Irritable bowel syndrome with diarrhea: Secondary | ICD-10-CM | POA: Diagnosis not present

## 2024-02-24 DIAGNOSIS — K219 Gastro-esophageal reflux disease without esophagitis: Secondary | ICD-10-CM

## 2024-02-24 MED ORDER — VIBERZI 75 MG PO TABS: 1.0000 | ORAL_TABLET | Freq: Two times a day (BID) | ORAL | 5 refills | Status: AC

## 2024-02-24 NOTE — Telephone Encounter (Signed)
 Medication Samples have been provided to the patient.  Drug name: Viberzi        Strength: 75mg         Qty: 3 LOT: [id number]  Exp.Date: 06/06/2024  Dosing instructions: Take one capsule 2 times daily   The patient has been instructed regarding the correct time, dose, and frequency of taking this medication, including desired effects and most common side effects.   Breanna [name] 3:41 PM 02/24/2024
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
## 2024-02-24 NOTE — Patient Instructions (Addendum)
 We will give you some samples of Viberzi  today, that should be enough for at least 3 weeks for you while we work on resubmitting the patient assistance.  It is important that you send us  a copy of income verification in order to submit your assistance paperwork and we also need a copy of your driver's license as well.  I will increase your pantoprazole  to 40 mg once daily.  For now you may take 2 of your 20 mg capsules until you run out.  Follow a GERD diet:  Avoid fried, fatty, greasy, spicy, citrus foods. Avoid caffeine and carbonated beverages. Avoid chocolate. Try eating 4-6 small meals a day rather than 3 large meals. Do not eat within 3 hours of laying down. Prop head of bed up on wood or bricks to create a 6 inch incline.  I have to have a [employer] and a Happy New Year!  We will plan for another follow-up in about 6 months.  It was a pleasure to see you today. I want to create trusting relationships with patients. If you receive a survey regarding your visit,  I greatly appreciate you taking time to fill this out on paper or through your MyChart. I value your feedback.  Charmaine Melia, MSN, FNP-BC, AGACNP-BC [facility]

## 2024-04-06 NOTE — Telephone Encounter (Signed)
 Lets do a video visit tomorrow if that will work for her  I am surprised I do not have any openings as virtual days are wide open.

## 2024-04-06 NOTE — Telephone Encounter (Signed)
 Breanna [name], Patient had to stop her Zepbound  injections d/t illness and would like to start them back.  She would like to know if she needs to start back at the same dose or a lower dose.  Please advise.  Thank you.

## 2024-04-11 ENCOUNTER — Encounter: Payer: Self-pay | Admitting: *Deleted

## 2024-04-11 NOTE — Telephone Encounter (Signed)
 Called and scheduled patient for in-person appt on 05/01/24 at 11:30. Pt aware of appt and verbalized understanding, NFN.

## 2024-05-01 ENCOUNTER — Ambulatory Visit: Admitting: Adult Health

## 2024-05-23 ENCOUNTER — Ambulatory Visit: Admitting: Adult Health
